# Patient Record
Sex: Female | Born: 1940 | Race: White | Hispanic: No | Smoking: Former smoker
Health system: Southern US, Community
[De-identification: ages and names within clinical notes are randomized; demographics above are authoritative.]

## PROBLEM LIST (undated history)

## (undated) ENCOUNTER — Emergency Department: Admission: EM | Payer: Self-pay | Source: Home / Self Care

## (undated) DIAGNOSIS — E785 Hyperlipidemia, unspecified: Secondary | ICD-10-CM

## (undated) DIAGNOSIS — F329 Major depressive disorder, single episode, unspecified: Secondary | ICD-10-CM

## (undated) DIAGNOSIS — J449 Chronic obstructive pulmonary disease, unspecified: Secondary | ICD-10-CM

## (undated) DIAGNOSIS — K120 Recurrent oral aphthae: Secondary | ICD-10-CM

## (undated) DIAGNOSIS — E119 Type 2 diabetes mellitus without complications: Secondary | ICD-10-CM

## (undated) DIAGNOSIS — F32A Depression, unspecified: Secondary | ICD-10-CM

## (undated) DIAGNOSIS — C50911 Malignant neoplasm of unspecified site of right female breast: Secondary | ICD-10-CM

## (undated) DIAGNOSIS — I214 Non-ST elevation (NSTEMI) myocardial infarction: Secondary | ICD-10-CM

## (undated) DIAGNOSIS — K219 Gastro-esophageal reflux disease without esophagitis: Secondary | ICD-10-CM

## (undated) DIAGNOSIS — I509 Heart failure, unspecified: Secondary | ICD-10-CM

## (undated) DIAGNOSIS — G250 Essential tremor: Secondary | ICD-10-CM

## (undated) DIAGNOSIS — E039 Hypothyroidism, unspecified: Secondary | ICD-10-CM

## (undated) DIAGNOSIS — F3289 Other specified depressive episodes: Secondary | ICD-10-CM

## (undated) DIAGNOSIS — I2510 Atherosclerotic heart disease of native coronary artery without angina pectoris: Secondary | ICD-10-CM

## (undated) DIAGNOSIS — J4489 Other specified chronic obstructive pulmonary disease: Secondary | ICD-10-CM

## (undated) DIAGNOSIS — E669 Obesity, unspecified: Secondary | ICD-10-CM

## (undated) DIAGNOSIS — I209 Angina pectoris, unspecified: Secondary | ICD-10-CM

## (undated) HISTORY — DX: Obesity, unspecified: E66.9

## (undated) HISTORY — PX: MASTECTOMY: SHX3

## (undated) HISTORY — DX: Other specified depressive episodes: F32.89

## (undated) HISTORY — DX: Essential tremor: G25.0

## (undated) HISTORY — DX: Recurrent oral aphthae: K12.0

## (undated) HISTORY — PX: TONSILLECTOMY: SUR1361

## (undated) HISTORY — PX: APPENDECTOMY: SHX54

## (undated) HISTORY — DX: Hyperlipidemia, unspecified: E78.5

## (undated) HISTORY — DX: Type 2 diabetes mellitus without complications: E11.9

## (undated) HISTORY — DX: Major depressive disorder, single episode, unspecified: F32.9

## (undated) HISTORY — PX: CHOLECYSTECTOMY: SHX55

## (undated) HISTORY — PX: ABDOMINAL HYSTERECTOMY: SHX81

## (undated) HISTORY — DX: Chronic obstructive pulmonary disease, unspecified: J44.9

## (undated) HISTORY — DX: Atherosclerotic heart disease of native coronary artery without angina pectoris: I25.10

## (undated) HISTORY — PX: OTHER SURGICAL HISTORY: SHX169

## (undated) HISTORY — DX: Non-ST elevation (NSTEMI) myocardial infarction: I21.4

## (undated) HISTORY — DX: Other specified chronic obstructive pulmonary disease: J44.89

## (undated) HISTORY — DX: Hypothyroidism, unspecified: E03.9

## (undated) HISTORY — DX: Depression, unspecified: F32.A

## (undated) HISTORY — DX: Malignant neoplasm of unspecified site of right female breast: C50.911

---

## 1996-09-02 DIAGNOSIS — C50911 Malignant neoplasm of unspecified site of right female breast: Secondary | ICD-10-CM

## 1996-09-02 HISTORY — DX: Malignant neoplasm of unspecified site of right female breast: C50.911

## 1997-12-16 ENCOUNTER — Encounter: Admission: RE | Admit: 1997-12-16 | Discharge: 1998-03-16 | Payer: Self-pay | Admitting: Radiation Oncology

## 1997-12-19 ENCOUNTER — Other Ambulatory Visit: Admission: RE | Admit: 1997-12-19 | Discharge: 1997-12-19 | Payer: Self-pay | Admitting: General Surgery

## 1999-07-30 ENCOUNTER — Inpatient Hospital Stay (HOSPITAL_COMMUNITY): Admission: EM | Admit: 1999-07-30 | Discharge: 1999-08-03 | Payer: Self-pay | Admitting: Emergency Medicine

## 1999-07-30 ENCOUNTER — Encounter: Payer: Self-pay | Admitting: Emergency Medicine

## 1999-08-01 ENCOUNTER — Encounter: Payer: Self-pay | Admitting: Emergency Medicine

## 1999-08-02 ENCOUNTER — Encounter: Payer: Self-pay | Admitting: Emergency Medicine

## 1999-11-30 ENCOUNTER — Encounter: Admission: RE | Admit: 1999-11-30 | Discharge: 1999-11-30 | Payer: Self-pay | Admitting: Oncology

## 1999-11-30 ENCOUNTER — Encounter: Payer: Self-pay | Admitting: Oncology

## 2000-05-27 ENCOUNTER — Encounter: Payer: Self-pay | Admitting: Oncology

## 2000-05-27 ENCOUNTER — Encounter: Admission: RE | Admit: 2000-05-27 | Discharge: 2000-05-27 | Payer: Self-pay | Admitting: Oncology

## 2001-09-22 ENCOUNTER — Encounter: Payer: Self-pay | Admitting: Hematology and Oncology

## 2001-09-22 ENCOUNTER — Encounter: Admission: RE | Admit: 2001-09-22 | Discharge: 2001-09-22 | Payer: Self-pay | Admitting: Hematology and Oncology

## 2002-09-13 ENCOUNTER — Encounter: Admission: RE | Admit: 2002-09-13 | Discharge: 2002-09-13 | Payer: Self-pay | Admitting: Hematology and Oncology

## 2002-09-13 ENCOUNTER — Encounter: Payer: Self-pay | Admitting: Hematology and Oncology

## 2003-09-15 ENCOUNTER — Encounter: Admission: RE | Admit: 2003-09-15 | Discharge: 2003-09-15 | Payer: Self-pay | Admitting: Hematology and Oncology

## 2003-10-31 ENCOUNTER — Ambulatory Visit: Admission: RE | Admit: 2003-10-31 | Discharge: 2003-10-31 | Payer: Self-pay | Admitting: Radiation Oncology

## 2004-09-17 ENCOUNTER — Encounter: Admission: RE | Admit: 2004-09-17 | Discharge: 2004-09-17 | Payer: Self-pay | Admitting: Hematology and Oncology

## 2004-10-04 ENCOUNTER — Encounter: Admission: RE | Admit: 2004-10-04 | Discharge: 2005-01-02 | Payer: Self-pay | Admitting: Emergency Medicine

## 2004-10-26 ENCOUNTER — Ambulatory Visit: Admission: RE | Admit: 2004-10-26 | Discharge: 2004-10-26 | Payer: Self-pay | Admitting: Radiation Oncology

## 2005-06-10 ENCOUNTER — Encounter: Admission: RE | Admit: 2005-06-10 | Discharge: 2005-06-10 | Payer: Self-pay | Admitting: Emergency Medicine

## 2005-09-18 ENCOUNTER — Encounter: Admission: RE | Admit: 2005-09-18 | Discharge: 2005-09-18 | Payer: Self-pay | Admitting: Hematology and Oncology

## 2006-09-19 ENCOUNTER — Encounter: Admission: RE | Admit: 2006-09-19 | Discharge: 2006-09-19 | Payer: Self-pay | Admitting: Hematology and Oncology

## 2007-08-05 ENCOUNTER — Inpatient Hospital Stay (HOSPITAL_COMMUNITY): Admission: EM | Admit: 2007-08-05 | Discharge: 2007-08-12 | Payer: Self-pay | Admitting: Emergency Medicine

## 2007-08-05 ENCOUNTER — Ambulatory Visit: Payer: Self-pay | Admitting: Internal Medicine

## 2007-08-05 LAB — CONVERTED CEMR LAB: Pap Smear: NORMAL

## 2007-08-06 ENCOUNTER — Encounter (INDEPENDENT_AMBULATORY_CARE_PROVIDER_SITE_OTHER): Payer: Self-pay | Admitting: *Deleted

## 2007-08-17 DIAGNOSIS — J439 Emphysema, unspecified: Secondary | ICD-10-CM

## 2007-08-17 DIAGNOSIS — E669 Obesity, unspecified: Secondary | ICD-10-CM

## 2007-08-17 DIAGNOSIS — I2510 Atherosclerotic heart disease of native coronary artery without angina pectoris: Secondary | ICD-10-CM | POA: Insufficient documentation

## 2007-08-17 DIAGNOSIS — J9600 Acute respiratory failure, unspecified whether with hypoxia or hypercapnia: Secondary | ICD-10-CM

## 2007-08-17 DIAGNOSIS — E785 Hyperlipidemia, unspecified: Secondary | ICD-10-CM

## 2007-08-18 ENCOUNTER — Ambulatory Visit: Payer: Self-pay | Admitting: Critical Care Medicine

## 2007-08-18 DIAGNOSIS — J9610 Chronic respiratory failure, unspecified whether with hypoxia or hypercapnia: Secondary | ICD-10-CM | POA: Insufficient documentation

## 2007-08-31 ENCOUNTER — Encounter: Payer: Self-pay | Admitting: Critical Care Medicine

## 2007-08-31 ENCOUNTER — Encounter: Payer: Self-pay | Admitting: Pulmonary Disease

## 2007-08-31 ENCOUNTER — Ambulatory Visit (HOSPITAL_BASED_OUTPATIENT_CLINIC_OR_DEPARTMENT_OTHER): Admission: RE | Admit: 2007-08-31 | Discharge: 2007-08-31 | Payer: Self-pay | Admitting: Critical Care Medicine

## 2007-09-15 ENCOUNTER — Ambulatory Visit: Payer: Self-pay | Admitting: Critical Care Medicine

## 2007-09-15 ENCOUNTER — Ambulatory Visit: Payer: Self-pay | Admitting: Pulmonary Disease

## 2007-09-21 ENCOUNTER — Encounter: Payer: Self-pay | Admitting: Critical Care Medicine

## 2008-07-22 ENCOUNTER — Telehealth (INDEPENDENT_AMBULATORY_CARE_PROVIDER_SITE_OTHER): Payer: Self-pay | Admitting: *Deleted

## 2008-08-08 ENCOUNTER — Telehealth (INDEPENDENT_AMBULATORY_CARE_PROVIDER_SITE_OTHER): Payer: Self-pay | Admitting: *Deleted

## 2008-09-06 ENCOUNTER — Ambulatory Visit: Payer: Self-pay | Admitting: Critical Care Medicine

## 2008-09-06 DIAGNOSIS — R892 Abnormal level of other drugs, medicaments and biological substances in specimens from other organs, systems and tissues: Secondary | ICD-10-CM | POA: Insufficient documentation

## 2008-09-06 LAB — CONVERTED CEMR LAB
ALT: 23 units/L (ref 0–35)
AST: 24 units/L (ref 0–37)
Albumin: 3.9 g/dL (ref 3.5–5.2)
Alkaline Phosphatase: 64 units/L (ref 39–117)
Total Bilirubin: 0.7 mg/dL (ref 0.3–1.2)

## 2008-11-14 ENCOUNTER — Encounter: Payer: Self-pay | Admitting: Critical Care Medicine

## 2008-11-23 ENCOUNTER — Ambulatory Visit: Payer: Self-pay | Admitting: Internal Medicine

## 2008-12-06 ENCOUNTER — Ambulatory Visit: Payer: Self-pay | Admitting: Critical Care Medicine

## 2008-12-07 ENCOUNTER — Encounter: Payer: Self-pay | Admitting: Critical Care Medicine

## 2008-12-12 ENCOUNTER — Telehealth (INDEPENDENT_AMBULATORY_CARE_PROVIDER_SITE_OTHER): Payer: Self-pay | Admitting: *Deleted

## 2009-03-08 ENCOUNTER — Telehealth (INDEPENDENT_AMBULATORY_CARE_PROVIDER_SITE_OTHER): Payer: Self-pay | Admitting: *Deleted

## 2009-03-08 ENCOUNTER — Ambulatory Visit: Payer: Self-pay | Admitting: Critical Care Medicine

## 2009-03-23 ENCOUNTER — Ambulatory Visit: Payer: Self-pay | Admitting: Critical Care Medicine

## 2009-03-23 ENCOUNTER — Telehealth: Payer: Self-pay | Admitting: Critical Care Medicine

## 2009-03-24 LAB — CONVERTED CEMR LAB
BUN: 18 mg/dL (ref 6–23)
CO2: 34 meq/L — ABNORMAL HIGH (ref 19–32)
Calcium: 9.5 mg/dL (ref 8.4–10.5)
Chloride: 100 meq/L (ref 96–112)
Creatinine, Ser: 1 mg/dL (ref 0.4–1.2)
GFR calc non Af Amer: 58.43 mL/min (ref >60)
Glucose, Bld: 154 mg/dL — ABNORMAL HIGH (ref 70–99)
Potassium: 4.4 meq/L (ref 3.5–5.1)
Sodium: 142 meq/L (ref 135–145)

## 2009-04-19 ENCOUNTER — Telehealth: Payer: Self-pay | Admitting: Critical Care Medicine

## 2009-05-10 ENCOUNTER — Ambulatory Visit: Payer: Self-pay | Admitting: Critical Care Medicine

## 2009-06-19 ENCOUNTER — Ambulatory Visit: Payer: Self-pay | Admitting: Critical Care Medicine

## 2009-06-19 ENCOUNTER — Telehealth: Payer: Self-pay | Admitting: Critical Care Medicine

## 2009-06-20 ENCOUNTER — Telehealth (INDEPENDENT_AMBULATORY_CARE_PROVIDER_SITE_OTHER): Payer: Self-pay | Admitting: *Deleted

## 2009-06-23 ENCOUNTER — Encounter: Payer: Self-pay | Admitting: Critical Care Medicine

## 2009-07-04 ENCOUNTER — Telehealth (INDEPENDENT_AMBULATORY_CARE_PROVIDER_SITE_OTHER): Payer: Self-pay | Admitting: *Deleted

## 2009-07-07 ENCOUNTER — Encounter: Payer: Self-pay | Admitting: Critical Care Medicine

## 2009-07-17 ENCOUNTER — Encounter: Payer: Self-pay | Admitting: Critical Care Medicine

## 2009-07-21 ENCOUNTER — Ambulatory Visit: Payer: Self-pay | Admitting: Critical Care Medicine

## 2009-07-21 ENCOUNTER — Telehealth (INDEPENDENT_AMBULATORY_CARE_PROVIDER_SITE_OTHER): Payer: Self-pay | Admitting: *Deleted

## 2009-07-21 LAB — CONVERTED CEMR LAB
BUN: 11 mg/dL (ref 6–23)
CO2: 37 meq/L — ABNORMAL HIGH (ref 19–32)
Calcium: 9.1 mg/dL (ref 8.4–10.5)
Chloride: 99 meq/L (ref 96–112)
Creatinine, Ser: 0.8 mg/dL (ref 0.4–1.2)
Glucose, Bld: 194 mg/dL — ABNORMAL HIGH (ref 70–99)
Magnesium: 2 mg/dL (ref 1.5–2.5)
Potassium: 4.4 meq/L (ref 3.5–5.1)
Sodium: 144 meq/L (ref 135–145)

## 2009-08-03 ENCOUNTER — Encounter: Payer: Self-pay | Admitting: Internal Medicine

## 2009-08-14 ENCOUNTER — Telehealth (INDEPENDENT_AMBULATORY_CARE_PROVIDER_SITE_OTHER): Payer: Self-pay | Admitting: *Deleted

## 2009-08-21 ENCOUNTER — Encounter: Payer: Self-pay | Admitting: Critical Care Medicine

## 2009-08-23 ENCOUNTER — Ambulatory Visit: Payer: Self-pay | Admitting: Critical Care Medicine

## 2009-08-23 DIAGNOSIS — K120 Recurrent oral aphthae: Secondary | ICD-10-CM

## 2009-08-28 ENCOUNTER — Encounter: Payer: Self-pay | Admitting: Critical Care Medicine

## 2009-09-04 ENCOUNTER — Telehealth: Payer: Self-pay | Admitting: Internal Medicine

## 2009-09-08 ENCOUNTER — Encounter: Payer: Self-pay | Admitting: Internal Medicine

## 2009-10-13 ENCOUNTER — Encounter: Payer: Self-pay | Admitting: Critical Care Medicine

## 2009-10-13 ENCOUNTER — Telehealth (INDEPENDENT_AMBULATORY_CARE_PROVIDER_SITE_OTHER): Payer: Self-pay | Admitting: *Deleted

## 2009-10-19 ENCOUNTER — Encounter: Payer: Self-pay | Admitting: Critical Care Medicine

## 2009-10-30 ENCOUNTER — Ambulatory Visit: Payer: Self-pay | Admitting: Critical Care Medicine

## 2009-10-31 ENCOUNTER — Telehealth (INDEPENDENT_AMBULATORY_CARE_PROVIDER_SITE_OTHER): Payer: Self-pay | Admitting: *Deleted

## 2009-12-13 ENCOUNTER — Ambulatory Visit: Payer: Self-pay | Admitting: Critical Care Medicine

## 2009-12-26 ENCOUNTER — Encounter: Payer: Self-pay | Admitting: Critical Care Medicine

## 2010-02-14 ENCOUNTER — Encounter: Payer: Self-pay | Admitting: Critical Care Medicine

## 2010-03-02 DIAGNOSIS — E119 Type 2 diabetes mellitus without complications: Secondary | ICD-10-CM

## 2010-03-02 HISTORY — DX: Type 2 diabetes mellitus without complications: E11.9

## 2010-03-13 ENCOUNTER — Telehealth: Payer: Self-pay | Admitting: Internal Medicine

## 2010-03-13 ENCOUNTER — Ambulatory Visit: Payer: Self-pay | Admitting: Critical Care Medicine

## 2010-03-13 DIAGNOSIS — R358 Other polyuria: Secondary | ICD-10-CM

## 2010-03-14 ENCOUNTER — Ambulatory Visit: Payer: Self-pay | Admitting: Internal Medicine

## 2010-03-14 ENCOUNTER — Telehealth: Payer: Self-pay | Admitting: Critical Care Medicine

## 2010-03-14 DIAGNOSIS — E1149 Type 2 diabetes mellitus with other diabetic neurological complication: Secondary | ICD-10-CM | POA: Insufficient documentation

## 2010-03-14 DIAGNOSIS — B379 Candidiasis, unspecified: Secondary | ICD-10-CM | POA: Insufficient documentation

## 2010-03-14 LAB — CONVERTED CEMR LAB
Basophils Absolute: 0 10*3/uL (ref 0.0–0.1)
Basophils Relative: 0.1 % (ref 0.0–3.0)
Bilirubin Urine: NEGATIVE
Blood Glucose, Fingerstick: 367
CO2: 30 meq/L (ref 19–32)
Calcium: 8.8 mg/dL (ref 8.4–10.5)
Chloride: 97 meq/L (ref 96–112)
Creatinine, Ser: 0.8 mg/dL (ref 0.4–1.2)
Eosinophils Absolute: 0 10*3/uL (ref 0.0–0.7)
Eosinophils Relative: 0.4 % (ref 0.0–5.0)
Glucose, Bld: 542 mg/dL (ref 70–99)
HCT: 46.5 % — ABNORMAL HIGH (ref 36.0–46.0)
Hemoglobin, Urine: NEGATIVE
Hemoglobin: 16.3 g/dL — ABNORMAL HIGH (ref 12.0–15.0)
Hgb A1c MFr Bld: 14.8 % — ABNORMAL HIGH (ref 4.6–6.5)
Lymphocytes Relative: 14.6 % (ref 12.0–46.0)
MCHC: 34.9 g/dL (ref 30.0–36.0)
MCV: 99.7 fL (ref 78.0–100.0)
Monocytes Absolute: 0.3 10*3/uL (ref 0.1–1.0)
Monocytes Relative: 3.7 % (ref 3.0–12.0)
Neutro Abs: 6.9 10*3/uL (ref 1.4–7.7)
Neutrophils Relative %: 81.2 % — ABNORMAL HIGH (ref 43.0–77.0)
Platelets: 216 10*3/uL (ref 150.0–400.0)
RBC: 4.67 M/uL (ref 3.87–5.11)
RDW: 13.3 % (ref 11.5–14.6)
Sodium: 137 meq/L (ref 135–145)
Specific Gravity, Urine: <=1.005 (ref 1.000–1.030)
Urine Glucose: >=1000 mg/dL
Urobilinogen, UA: 0.2 (ref 0.0–1.0)
WBC: 8.5 10*3/uL (ref 4.5–10.5)

## 2010-03-15 ENCOUNTER — Telehealth (INDEPENDENT_AMBULATORY_CARE_PROVIDER_SITE_OTHER): Payer: Self-pay | Admitting: *Deleted

## 2010-03-21 ENCOUNTER — Telehealth: Payer: Self-pay | Admitting: Internal Medicine

## 2010-03-21 ENCOUNTER — Telehealth (INDEPENDENT_AMBULATORY_CARE_PROVIDER_SITE_OTHER): Payer: Self-pay | Admitting: *Deleted

## 2010-03-27 ENCOUNTER — Telehealth: Payer: Self-pay | Admitting: Critical Care Medicine

## 2010-03-28 ENCOUNTER — Ambulatory Visit: Payer: Self-pay | Admitting: Internal Medicine

## 2010-03-30 ENCOUNTER — Encounter: Payer: Self-pay | Admitting: Internal Medicine

## 2010-04-02 ENCOUNTER — Encounter: Admission: RE | Admit: 2010-04-02 | Discharge: 2010-05-31 | Payer: Self-pay | Admitting: Internal Medicine

## 2010-04-02 ENCOUNTER — Encounter: Payer: Self-pay | Admitting: Internal Medicine

## 2010-04-16 ENCOUNTER — Telehealth: Payer: Self-pay | Admitting: Internal Medicine

## 2010-04-18 ENCOUNTER — Encounter: Payer: Self-pay | Admitting: Critical Care Medicine

## 2010-05-10 ENCOUNTER — Telehealth: Payer: Self-pay | Admitting: Internal Medicine

## 2010-06-04 ENCOUNTER — Encounter: Payer: Self-pay | Admitting: Internal Medicine

## 2010-06-18 ENCOUNTER — Encounter: Payer: Self-pay | Admitting: Critical Care Medicine

## 2010-06-26 ENCOUNTER — Ambulatory Visit: Payer: Self-pay | Admitting: Critical Care Medicine

## 2010-06-26 ENCOUNTER — Ambulatory Visit: Payer: Self-pay | Admitting: Internal Medicine

## 2010-06-26 DIAGNOSIS — L659 Nonscarring hair loss, unspecified: Secondary | ICD-10-CM | POA: Insufficient documentation

## 2010-06-26 LAB — CONVERTED CEMR LAB
Blood Glucose, Fingerstick: 114
Cholesterol: 171 mg/dL (ref 0–200)
HDL: 52 mg/dL (ref >39.00)
Hgb A1c MFr Bld: 6.5 % (ref 4.6–6.5)
LDL Cholesterol: 87 mg/dL (ref 0–99)
TSH: 0.64 microintl units/mL (ref 0.35–5.50)
Total CHOL/HDL Ratio: 3
VLDL: 31.6 mg/dL (ref 0.0–40.0)

## 2010-07-03 ENCOUNTER — Encounter: Payer: Self-pay | Admitting: Internal Medicine

## 2010-07-04 ENCOUNTER — Telehealth: Payer: Self-pay | Admitting: Internal Medicine

## 2010-07-13 ENCOUNTER — Telehealth (INDEPENDENT_AMBULATORY_CARE_PROVIDER_SITE_OTHER): Payer: Self-pay | Admitting: *Deleted

## 2010-08-17 ENCOUNTER — Encounter: Payer: Self-pay | Admitting: Critical Care Medicine

## 2010-09-25 ENCOUNTER — Ambulatory Visit
Admission: RE | Admit: 2010-09-25 | Discharge: 2010-09-25 | Payer: Self-pay | Source: Home / Self Care | Attending: Internal Medicine | Admitting: Internal Medicine

## 2010-09-25 ENCOUNTER — Ambulatory Visit
Admission: RE | Admit: 2010-09-25 | Discharge: 2010-09-25 | Payer: Self-pay | Source: Home / Self Care | Attending: Critical Care Medicine | Admitting: Critical Care Medicine

## 2010-09-25 ENCOUNTER — Encounter: Payer: Self-pay | Admitting: Critical Care Medicine

## 2010-09-25 ENCOUNTER — Other Ambulatory Visit: Payer: Self-pay | Admitting: Internal Medicine

## 2010-09-25 DIAGNOSIS — F329 Major depressive disorder, single episode, unspecified: Secondary | ICD-10-CM | POA: Insufficient documentation

## 2010-09-25 DIAGNOSIS — F3289 Other specified depressive episodes: Secondary | ICD-10-CM | POA: Insufficient documentation

## 2010-09-25 LAB — HEPATIC FUNCTION PANEL
ALT: 23 U/L (ref 0–35)
AST: 22 U/L (ref 0–37)
Albumin: 4 g/dL (ref 3.5–5.2)
Bilirubin, Direct: 0.1 mg/dL (ref 0.0–0.3)
Total Bilirubin: 0.6 mg/dL (ref 0.3–1.2)
Total Protein: 6.7 g/dL (ref 6.0–8.3)

## 2010-09-25 LAB — CREATININE, SERUM: Creatinine, Ser: 0.9 mg/dL (ref 0.4–1.2)

## 2010-09-25 LAB — POTASSIUM: Potassium: 4.8 mEq/L (ref 3.5–5.1)

## 2010-09-25 LAB — HEMOGLOBIN A1C: Hgb A1c MFr Bld: 6.8 % — ABNORMAL HIGH (ref 4.6–6.5)

## 2010-09-30 LAB — CONVERTED CEMR LAB
BUN: 13 mg/dL (ref 6–23)
CO2: 32 meq/L (ref 19–32)
Chloride: 101 meq/L (ref 96–112)
Creatinine, Ser: 1 mg/dL (ref 0.4–1.2)
GFR calc non Af Amer: 58.48 mL/min (ref >60)
Glucose, Bld: 162 mg/dL — ABNORMAL HIGH (ref 70–99)
Potassium: 3.3 meq/L — ABNORMAL LOW (ref 3.5–5.1)
Sodium: 142 meq/L (ref 135–145)

## 2010-10-01 ENCOUNTER — Encounter: Payer: Self-pay | Admitting: Internal Medicine

## 2010-10-04 NOTE — Assessment & Plan Note (Signed)
Summary: Pulmonary OV   Primary Provider/Referring Provider:  Newt Lukes MD  CC:  Follow up.  Pt c/o increased SOB with exertion - onset this am.  Occas prod cough with clear mucus.  Denies wheezing and chest tightness.[name]  History of Present Illness: 70 yo WF with advanced COPD Golds Stage III  oxygen dependent here for pulm f/u.    March 13, 2010 2:27 PM f/u from 4/11. The pt  notes more excess urination and urine incontinence.   The pt was rx for a yeast infxn x one month between the thighs.   Hospice rx diflucan and was better.  Pt with no prior dx of DM.  Pt is on prednisone 10mg /d.   The pt had hx of high BS when on high dose steroids with copd exac in the past.  The pt.  is still in hospice.  June 26, 2010 12:01 PM Pt notes legs hurting and throbbing.  Legs  not cramping,  just throbbing no chest pain.  Dyspnea is bad with exertion.   Drove self to OV No mucus  in the am.     September 25, 2010 11:50 AM Able to walk,  no real cough,  in the am is clear.  Lives by self, can make food. No real mucus.  Main issue is dyspnea at rest and with minimal exertion.  THe pt notes ongoing depression and anxiety.  No chest pain.  Occ pain bilat in LLs,  The pt is able to drive self. The pt is here for hospice recert.  Clinical Reports Reviewed:  PFT's:  12/06/2008: DLCO %Predicted:  53 FEF 25/75 %Predicted:  13 FEV1 %Predicted:  51 FVC %Predicted:  71 Post Spirometry FEF 25/75 %Predicted:  20 Post Spirometry FEV1 %Predicted:  59 Post Spirometry FVC %Predicted:  75 RV %Predicted:  156 TLC %Predicted:  106   Preventive Screening-Counseling & Management  Alcohol-Tobacco     Alcohol drinks/day: 0     Smoking Status: quit     Year Quit: 2008     Pack years: 64  Current Medications (verified): 1)  Bayer Low Strength 81 Mg  Tbec (Aspirin) .... Take 1 Tablet By Mouth Once A Day 2)  Lasix 40 Mg  Tabs (Furosemide) .... Take 1 Tablet By Mouth Two Times A Day 3)  Klor-Con 20  Meq  Pack (Potassium Chloride) .... Take 1 Tablets By Mouth Twice A Day 4)  Albuterol Sulfate (2.5 Mg/57ml) 0.083%  Nebu (Albuterol Sulfate) .... Four Times Daily or Every 6 Hours As Needed 5)  Nexium 40 Mg  Cpdr (Esomeprazole Magnesium) .... Take 1 Tablet By Mouth Once A Day 6)  Advair Diskus 500-50 Mcg/dose  Misc (Fluticasone-Salmeterol) .... One Puff Twice Daily 7)  Alprazolam 0.5 Mg  Tabs (Alprazolam) .... Take One Tab By Mouth Every 8 Hours As Needed 8)  Vitamin C 500 Mg Tabs (Ascorbic Acid) .[name].. 1 By Mouth Daily 9)  Fish Oil 1200 Mg Caps (Omega-3 Fatty Acids) .[name].. 1 Two Times A Day 10)  Vitamin E Complex 400 Unit Caps (Vitamin E) .... 2 By Mouth Daily 11)  Centrum Silver  Tabs (Multiple Vitamins-Minerals) .[name].. 1 By Mouth Daily 12)  Benzonatate 100 Mg Caps (Benzonatate) .... 2 By Mouth Every 8 Hours As Needed 13)  Prednisone 10 Mg  Tabs (Prednisone) .[name].. 1 Once Daily 14)  Magic Mouth Wash .Marland KitchenMarland KitchenMarland Kitchen 15ml Swish Gargle Expectorate Three Times Daily As Needed 15)  Microgaurd Powder .... As Directed 16)  Metformin Hcl 500  Mg Xr24h-Tab (Metformin Hcl) .[name].. 1 By Mouth Two Times A Day 17)  Glimepiride 2 Mg Tabs (Glimepiride) .... 1/2 By Mouth  Every Morning If Cbg >150 18)  Onetouch Ultra Test  Strp (Glucose Blood) .... Check Blood Sugar Two Times A Day  Dx: 250.00 19)  Onetouch Lancets  Misc (Lancets) .... Use Two Times A Day  Dx: 250.00 20)  Ventolin Hfa 108 (90 Base) Mcg/act Aers (Albuterol Sulfate) .... 2 Puffs Every 4-6 Hours As Needed 21)  Senokot S 8.6-50 Mg Tabs (Sennosides-Docusate Sodium) .[name].. 1-2 By Mouth At Bedtime As Needed For Constipation 22)  Pravastatin Sodium 40 Mg Tabs (Pravastatin Sodium) .[name].. 1 Tab By Mouth Once Daily 23)  Spiriva Handihaler 18 Mcg Caps (Tiotropium Bromide Monohydrate) .... Once Daily 24)  Advil 200 Mg Tabs (Ibuprofen) .... 2 Every 6 Hours As Needed  Allergies (verified): 1)  ! Sulfa 2)  ! Codeine  Past History:  Past medical, surgical, family and social  histories (including risk factors) reviewed, and no changes noted (except as noted below).  Past Medical History: Reviewed history from 06/26/2010 and no changes required. 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 Diabetes, Type 2     -03/2010: Hgb A1C 14.8, BS 540   MD roster: Lendon Ka  Past Surgical History: Reviewed history from 03/14/2010 and no changes required. Appendectomy Cholecystectomy Hysterectomy  Mastectomy, right Tonsillectomy  Past Pulmonary History:  Pulmonary History: COPD Golds Stage III     12/06/2008:  PFT's (COPD) Pulmonary Function Test  Date: 12/06/2008 Height (in.): 63 Gender: Female  Pre-Spirometry  FVC     Value: 1.95 L/min   Pred: 2.76 L/min     % Pred: 71 % FEV1     Value: 1.01 L     Pred: 1.97 L     % Pred: 51 % FEV1/FVC   Value: 52 %     Pred: 72 %     FEF 25-75   Value: 0.31 L/min   Pred: 2.29 L/min     % Pred: 13 %  Post-Spirometry  FVC     Value: 2.08 L/min   Pred: 2.76 L/min     % Pred: 75 % FEV1     Value: 1.16 L     Pred: 1.97 L     % Pred: 59 % FEV1/FVC   Value: 56 %     Pred: 72 %     FEF 25-75   Value: 0.46 L/min   Pred: 2.29 L/min     % Pred: 20 %  Lung Volumes  TLC     Value: 4.93 L   % Pred: 106 % RV     Value: 2.86 L   % Pred: 156 % DLCO     Value: 12.8 %   % Pred: 53 % DLCO/VA   Value: 4.35 %   % Pred: 122 %  Evaluation:  severe obstruction with significant bronchodilator response  Polysomnogram 12/08: No evidence of sleep apnea Nocturnal desat corrected with 3L [place] oxygen  Family History: Reviewed history from 03/14/2010 and no changes required. non contributory no diabetes known  Social History: Reviewed history from 06/26/2010 and no changes required. former smoker.  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  The patient denies productive cough, non-productive cough, coughing up blood, chest pain, irregular heartbeats, acid heartburn, indigestion, loss of appetite, weight change, abdominal pain, difficulty swallowing, sore throat, tooth/dental problems, headaches, nasal congestion/difficulty breathing through nose, sneezing, itching, ear ache, anxiety, depression, hand/feet swelling, joint stiffness or pain, rash, change in color of mucus, and fever.    Vital Signs:  Patient profile:   70 year old female Height:      62 inches Weight:      208 pounds BMI:     38.18 O2 Sat:      92 % on 1.5 L/minpulsed Temp:     97.6 degrees F oral Pulse rate:   88 / minute BP sitting:   130 / 78  (left arm) Cuff size:   regular  Vitals Entered By: Gweneth Dimitri RN (September 25, 2010 11:32 AM)  O2 Flow:  1.5 L/minpulsed  Serial Vital Signs/Assessments:  Comments: Ambulatory Pulse Oximetry  Resting; HR_83___    02 Sat__93% 2.5 liters pulsed___  Lap1 (185 feet)   HR__91___   02 Sat__91% 2.5 liters pulsed___ Lap2 (185 feet)   HR__102___   02 Sat__91% 2.5 liters pulsed___    Lap3 (185 feet)   HR_____   02 Sat_____  __X_Test Completed without Difficulty. Pt could only walk 2 laps due to her being tired and sob. Pt had to stop halfway on 1 st lap due to her being tired and was at 91% 2.5 liters pulsed and heart rate 96. Pt had to stop on 1 1/2 lap due to her being tired and sob and was at 92% 2.5 liters pulsed and heart rate 100. ___Test Stopped due to:  Carver Fila  September 25, 2010 12:16 PM  By: Carver Fila   CC: Follow up.  Pt c/o increased SOB with exertion - onset this am.  Occas prod cough with clear mucus.  Denies wheezing, chest tightness. Comments Medications reviewed with patient Daytime contact number verified with patient. Gweneth Dimitri RN   September 25, 2010 11:36 AM    Physical Exam  Additional Exam:  Gen: Pleasant, obese,  in no distress ENT: no lesions, no postnasal drip, apthous ulcer lower lip internal,  thrush is healing Neck: No JVD, no TMG, no carotid bruits Lungs: no use of accessory muscles, no dullness to percussion, distant BS, no wheeze, no rhonchi Cardiovascular: RRR, heart sounds normal, no murmurs or gallops, no peripheral edema Musculoskeletal: No deformities, no cyanosis or clubbing     Impression & Recommendations:  Problem # 1:  C O P D (ICD-496) Assessment Deteriorated  Her updated medication list for this problem includes:    Albuterol Sulfate (2.5 Mg/32ml) 0.083% Nebu (Albuterol sulfate) .[name]... Four times daily or every 6 hours as needed    Advair Diskus 500-50 Mcg/dose Misc (Fluticasone-salmeterol) ..... One puff twice daily    Ventolin Hfa 108 (90 Base) Mcg/act Aers (Albuterol sulfate) .[name]... 2 puffs every 4-6 hours as needed    Spiriva Handihaler 18 Mcg Caps (Tiotropium bromide monohydrate) ..... Once daily  End stage copd golds stage iv,  The Fev1 has declined further to 0.88  42% pred which is a 15% decline from prior values Pt has overt depression and I discussed this with PCP who will rx sertraline plan cont hospice care No change in inhaled medications.   Maintain treatment program as currently prescribed. I would recert this pt for palliative care  if [facility] MD will agree  Pulmonary Functions Reviewed: FEV1: 1.01 (12/06/2008) >>>  FeV1 0.88 42%  1/12  Medications Added to Medication List This Visit: 1)  Oxygen  .[name].. 3l continuous  Complete Medication List: 1)  Bayer Low Strength 81 Mg Tbec (Aspirin) .... Take 1 tablet by mouth once a day 2)  Lasix 40 Mg Tabs (Furosemide) .... Take 1 tablet by mouth two times a day 3)  Klor-con 20 Meq Pack (Potassium chloride) .... Take 1 tablets by mouth twice a day 4)  Albuterol Sulfate (2.5 Mg/82ml) 0.083% Nebu (Albuterol sulfate) .... Four times  daily or every 6 hours as needed 5)  Nexium 40 Mg Cpdr (Esomeprazole magnesium) .... Take 1 tablet by mouth once a day 6)  Advair Diskus 500-50 Mcg/dose Misc (Fluticasone-salmeterol) .... One puff twice daily 7)  Alprazolam 0.5 Mg Tabs (Alprazolam) .... Take one tab by mouth every 8 hours as needed 8)  Vitamin C 500 Mg Tabs (Ascorbic acid) .[name].. 1 by mouth daily 9)  Fish Oil 1200 Mg Caps (Omega-3 fatty acids) .[name].. 1 two times a day 10)  Vitamin E Complex 400 Unit Caps (Vitamin e) .... 2 by mouth daily 11)  Centrum Silver Tabs (Multiple vitamins-minerals) .[name].. 1 by mouth daily 12)  Benzonatate 100 Mg Caps (Benzonatate) .... 2 by mouth every 8 hours as needed 13)  Prednisone 10 Mg Tabs (Prednisone) .[name].. 1 once daily 14)  Magic Mouth Wash  .Marland KitchenMarland KitchenMarland Kitchen 15ml swish gargle expectorate three times daily as needed 15)  Microgaurd Powder  .... As directed 16)  Metformin Hcl 500 Mg Xr24h-tab (Metformin hcl) .[name].. 1 by mouth two times a day 17)  Glimepiride 2 Mg Tabs (Glimepiride) .... 1/2 by mouth  every morning if cbg >150 18)  Onetouch Ultra Test Strp (Glucose blood) .... Check blood sugar two times a day  dx: 250.00 19)  Onetouch Lancets Misc (Lancets) .... Use two times a day  dx: 250.00 20)  Ventolin Hfa 108 (90 Base) Mcg/act Aers (Albuterol sulfate) .... 2 puffs every 4-6 hours as needed 21)  Senokot S 8.6-50 Mg Tabs (Sennosides-docusate sodium) .[name].. 1-2 by mouth at bedtime as needed for constipation 22)  Pravastatin Sodium 40 Mg Tabs (Pravastatin sodium) .[name].. 1 tab by mouth once daily 23)  Spiriva Handihaler 18 Mcg Caps (Tiotropium bromide monohydrate) .... Once daily 24)  Advil 200 Mg Tabs (Ibuprofen) .... 2 every 6 hours as needed 25)  Oxygen  .[name].. 3l continuous 26)  Sertraline Hcl 25 Mg Tabs (Sertraline hcl) .[name].. 1 by mouth once daily  Other Orders: Est. Patient Level IV (04540) Spirometry w/Graph (94010) Pulse Oximetry, Ambulatory (98119)  Patient Instructions: 1)  No change in medications 2)   I will speak to Hospice about continuing your care 3)  Return in    2      months  Prescriptions: SPIRIVA HANDIHALER 18 MCG CAPS (TIOTROPIUM BROMIDE MONOHYDRATE) once daily  #30 x 6   Entered and Authorized by:   Storm Frisk MD   Signed by:   Storm Frisk MD on 09/25/2010   Method used:   Electronically to        [facility]  [address] [phone]* (retail)       [address]       [place], [place]  [id number]       Ph: [phone] or [phone]       Fax: [phone]   RxID:  [id number]     Appended Document: Pulmonary OV fax [facility]

## 2010-10-04 NOTE — Assessment & Plan Note (Signed)
Summary: Pulmonary OV   Primary Provider/Referring Provider:  Dr. Sanda Linger  CC:  2 month follow up.  Pt states she cont to have SOB when walking and when under stress.  States she has a prod cough with clear mucus.  Denies wheezing and chest tightness.  [name]  History of Present Illness: 70 yo WF with advanced COPD Golds Stage III  oxygen dependent here for pulm f/u.     December 13, 2009 3:25 PM Here for copd f/u .  The pt has no excess mucous.  The pt now has an acapella valve per Hospice. Pt denies any significant sore throat, nasal congestion or excess secretions, fever, chills, sweats, unintended weight loss, pleurtic or exertional chest pain, orthopnea PND, or leg swelling Pt denies any increase in rescue therapy over baseline, denies waking up needing it or having any early am or nocturnal exacerbations of coughing/wheezing/or dyspnea.   August 23, 2009 8:55 AM The pt now has thrush and is on diflucan.   The dyspnea is the same and there is less cough.  There is less mucous production. Hospice helps with home care. The pt remains on oxygen.     Preventive Screening-Counseling & Management  Alcohol-Tobacco     Smoking Status: quit > 6 months  Current Medications (verified): 1)  Bayer Low Strength 81 Mg  Tbec (Aspirin) .... Take 1 Tablet By Mouth Once A Day 2)  Lasix 40 Mg  Tabs (Furosemide) .... Take 1 Tablet By Mouth Two Times A Day 3)  Klor-Con 20 Meq  Pack (Potassium Chloride) .... Take 1 Tablets By Mouth Twice A Day 4)  Albuterol Sulfate (2.5 Mg/66ml) 0.083%  Nebu (Albuterol Sulfate) .... Four Times Daily or Every 6 Hours As Needed 5)  Nexium 40 Mg  Cpdr (Esomeprazole Magnesium) .... Take 1 Tablet By Mouth Once A Day 6)  Advair Diskus 500-50 Mcg/dose  Misc (Fluticasone-Salmeterol) .... One Puff Twice Daily 7)  Alprazolam 0.5 Mg  Tabs (Alprazolam) .... Take One Tab By Mouth Every 8 Hours As Needed 8)  Vitamin C 500 Mg Tabs (Ascorbic Acid) .[name].. 1 By Mouth Daily 9)  Fish  Oil 1200 Mg Caps (Omega-3 Fatty Acids) .[name].. 1 Two Times A Day 10)  Vitamin E Complex 400 Unit Caps (Vitamin E) .... 2 By Mouth Daily 11)  Centrum Silver  Tabs (Multiple Vitamins-Minerals) .[name].. 1 By Mouth Daily 12)  Benzonatate 100 Mg Caps (Benzonatate) .... 2 By Mouth Every 8 Hours As Needed 13)  Prednisone 10 Mg  Tabs (Prednisone) .[name].. 1 Once Daily 14)  Magic Mouth Wash .Marland KitchenMarland KitchenMarland Kitchen 15ml Swish Gargle Expectorate Three Times Daily As Needed 15)  Microgaurd Powder .... As Directed  Allergies (verified): 1)  ! Sulfa 2)  ! Codeine  Past History:  Past medical, surgical, family and social histories (including risk factors) reviewed, and no changes noted (except as noted below).  Past Medical History: Reviewed history from 05/10/2009 and no changes required. C O P D Hyperlipidemia gastroesophageal reflux, anxiety, obesity, breast cancer in 1998, status post chemotherapy and radiation, and right mastectomy, hypothyroidism. Surgical history.  Status post hysterectomy, cholecystectomy, hernia repair x 2.  Status post appendectomy, and hemorrhoidectomy  Past Surgical History: Reviewed history from 11/23/2008 and no changes required. Appendectomy Cholecystectomy Hysterectomy Mastectomy Tonsillectomy  Past Pulmonary History:  Pulmonary History: COPD Golds Stage III     12/06/2008:  PFT's (COPD) Pulmonary Function Test  Date: 12/06/2008 Height (in.): 63 Gender: Female  Pre-Spirometry  FVC     Value:  1.95 L/min   Pred: 2.76 L/min     % Pred: 71 % FEV1     Value: 1.01 L     Pred: 1.97 L     % Pred: 51 % FEV1/FVC   Value: 52 %     Pred: 72 %     FEF 25-75   Value: 0.31 L/min   Pred: 2.29 L/min     % Pred: 13 %  Post-Spirometry  FVC     Value: 2.08 L/min   Pred: 2.76 L/min     % Pred: 75 % FEV1     Value: 1.16 L     Pred: 1.97 L     % Pred: 59 % FEV1/FVC   Value: 56 %     Pred: 72 %     FEF 25-75   Value: 0.46 L/min   Pred: 2.29 L/min     % Pred: 20 %  Lung Volumes  TLC     Value: 4.93  L   % Pred: 106 % RV     Value: 2.86 L   % Pred: 156 % DLCO     Value: 12.8 %   % Pred: 53 % DLCO/VA   Value: 4.35 %   % Pred: 122 %  Evaluation:  severe obstruction with significant bronchodilator response  Polysomnogram 12/08: No evidence of sleep apnea Nocturnal desat corrected with 3L [place] oxygen  Family History: Reviewed history from 09/15/2007 and no changes required. non contributory  Social History: Reviewed history from 11/23/2008 and no changes required. Patient states former smoker.  Retired Widow/Widower Alcohol use-no Drug use-no Regular exercise-no  Review of Systems       The patient complains of shortness of breath with activity.  The patient denies shortness of breath at rest, productive cough, non-productive cough, coughing up blood, chest pain, irregular heartbeats, acid heartburn, indigestion, loss of appetite, weight change, abdominal pain, difficulty swallowing, sore throat, tooth/dental problems, headaches, nasal congestion/difficulty breathing through nose, sneezing, itching, ear ache, anxiety, depression, hand/feet swelling, joint stiffness or pain, rash, change in color of mucus, and fever.    Vital Signs:  Patient profile:   70 year old female Height:      62 inches Weight:      230.25 pounds BMI:     42.27 O2 Sat:      93 % on 3 L/minpulsed Pulse rate:   94 / minute BP sitting:   122 / 84  (left arm) Cuff size:   large  Vitals Entered By: Gweneth Dimitri RN (December 13, 2009 2:49 PM)  O2 Flow:  3 L/minpulsed CC: 2 month follow up.  Pt states she cont to have SOB when walking and when under stress.  States she has a prod cough with clear mucus.  Denies wheezing and chest tightness.   Comments Medications reviewed with patient Daytime contact number verified with patient. Gweneth Dimitri RN  December 13, 2009 2:50 PM    Physical Exam  Additional Exam:  Gen: Pleasant, obese,  in no distress ENT: no lesions, no postnasal drip, apthous ulcer lower lip  internal,  thrush is healing Neck: No JVD, no TMG, no carotid bruits Lungs: no use of accessory muscles, no dullness to percussion, distant BS, no wheeze, no rhonchi Cardiovascular: RRR, heart sounds normal, no murmurs or gallops, no peripheral edema Musculoskeletal: No deformities, no cyanosis or clubbing     Impression & Recommendations:  Problem # 1:  CHRONIC RESPIRATORY FAILURE (BMW-413.24) Assessment Unchanged  chronic  resp failure with Golds stage IV COPD plan cont hospice care cont inhaled meds as prescribed  Complete Medication List: 1)  Bayer Low Strength 81 Mg Tbec (Aspirin) .... Take 1 tablet by mouth once a day 2)  Lasix 40 Mg Tabs (Furosemide) .... Take 1 tablet by mouth two times a day 3)  Klor-con 20 Meq Pack (Potassium chloride) .... Take 1 tablets by mouth twice a day 4)  Albuterol Sulfate (2.5 Mg/26ml) 0.083% Nebu (Albuterol sulfate) .... Four times daily or every 6 hours as needed 5)  Nexium 40 Mg Cpdr (Esomeprazole magnesium) .... Take 1 tablet by mouth once a day 6)  Advair Diskus 500-50 Mcg/dose Misc (Fluticasone-salmeterol) .... One puff twice daily 7)  Alprazolam 0.5 Mg Tabs (Alprazolam) .... Take one tab by mouth every 8 hours as needed 8)  Vitamin C 500 Mg Tabs (Ascorbic acid) .[name].. 1 by mouth daily 9)  Fish Oil 1200 Mg Caps (Omega-3 fatty acids) .[name].. 1 two times a day 10)  Vitamin E Complex 400 Unit Caps (Vitamin e) .... 2 by mouth daily 11)  Centrum Silver Tabs (Multiple vitamins-minerals) .[name].. 1 by mouth daily 12)  Benzonatate 100 Mg Caps (Benzonatate) .... 2 by mouth every 8 hours as needed 13)  Prednisone 10 Mg Tabs (Prednisone) .[name].. 1 once daily 14)  Magic Mouth Wash  .Marland KitchenMarland KitchenMarland Kitchen 15ml swish gargle expectorate three times daily as needed 15)  Microgaurd Powder  .... As directed  Other Orders: Est. Patient Level III (04540)  Patient Instructions: 1)  No change in medications 2)  Return 3 months 3)  Have the Hospice RN show you how to operate the flutter  valve  Appended Document: Pulmonary OV fax [facility]

## 2010-10-04 NOTE — Miscellaneous (Signed)
Summary: Physician's Interim Order/[facility] at [facility] Interim Order/Hospice at [facility]   Imported By: Maryln Gottron 09/05/2009 10:27:56  _____________________________________________________________________  External Attachment:    Type:   Image     Comment:   External Document

## 2010-10-04 NOTE — Miscellaneous (Signed)
Summary: Rx order/[facility]  Rx order/[place]   Imported By: Lester [place] 09/15/2009 12:41:14  _____________________________________________________________________  External Attachment:    Type:   Image     Comment:   External Document

## 2010-10-04 NOTE — Progress Notes (Signed)
Summary: can not afford nexium- ATC NA   Phone Note Call from Patient Call back at Home Phone [phone]   Caller: Patient Summary of Call: pt called primary care stating she needs refill of Nexium but she has hit the donut hole and can't afford Rx.   Rx is with pulmonary. will forward note to pulmonary. Initial call taken by: Alysia Penna,  July 13, 2010 9:28 AM  Follow-up for Phone Call        ATC pt back, NA and no option to leave a msg. Vernie Murders  July 13, 2010 1:58 PM   Additional Follow-up for Phone Call Additional follow up Details #1::        Spoke with pt.  She states that she can not afford nexium b/c she is in the donut hole.  She is requesting samples but we have none.  She would like to know if she can have sample of another reflux med.  Pls advise thanks Additional Follow-up by: Vernie Murders,  July 13, 2010 3:18 PM    Additional Follow-up for Phone Call Additional follow up Details #2::    prilosec 20mg  once daily otc  is fine  Follow-up by: Rubye Oaks NP,  July 13, 2010 5:07 PM  Additional Follow-up for Phone Call Additional follow up Details #3:: Details for Additional Follow-up Action Taken: Spoke with pt and notified the above.  Pt verbalized understanding. Additional Follow-up by: Vernie Murders,  July 13, 2010 5:09 PM

## 2010-10-04 NOTE — Assessment & Plan Note (Signed)
Summary: Pulmonary OV   Primary Provider/Referring Provider:  Dr. Sanda Linger  CC:  2 month follow up.  states breathing is worse.  Having increased SOB when under stress and with activity x86month. states she is coughing a lot of clear mucus up first thing in the morning.  denies wheezing and chest tightness.  [name]  History of Present Illness: 70 yo WF with advanced COPD Golds Stage III  oxygen dependent here for pulm f/u.  The pt was hospitalized in 12/08 with acute on chronic resp failure.   A sleep study was performed in 12/08.  By report the pt states showed minimal restless leg syndrome but no significant sleep disorder.   August 23, 2009 8:55 AM The pt now has thrush and is on diflucan.   The dyspnea is the same and there is less cough.  There is less mucous production. Hospice helps with home care. The pt remains on oxygen.     October 30, 2009 2:17 PM copd. f/u.  The pt is in hospice and notes   with more stress is worse,  worse with exertion,  mothers in snf.  There is some mucous but is clear in the am, no chest pain . Pt denies any increase in rescue therapy over baseline, denies waking up needing it or having any early am or nocturnal exacerbations of coughing/wheezing/or dyspnea. Pt denies any significant sore throat, nasal congestion or excess secretions, fever, chills, sweats, unintended weight loss, pleurtic or exertional chest pain, orthopnea PND, or leg swelling   August 23, 2009 8:55 AM The pt now has thrush and is on diflucan.   The dyspnea is the same and there is less cough.  There is less mucous production. Hospice helps with home care. The pt remains on oxygen.     Preventive Screening-Counseling & Management  Alcohol-Tobacco     Smoking Status: quit > 6 months  Current Medications (verified): 1)  Bayer Low Strength 81 Mg  Tbec (Aspirin) .... Take 1 Tablet By Mouth Once A Day 2)  Lasix 40 Mg  Tabs (Furosemide) .... Take 1 Tablet By Mouth Two Times A  Day 3)  Klor-Con 20 Meq  Pack (Potassium Chloride) .... Take 1 Tablets By Mouth Twice A Day 4)  Spiriva Handihaler 18 Mcg  Caps (Tiotropium Bromide Monohydrate) .... Inhale Contents of 1 Capsule Once A Day 5)  Albuterol Sulfate (2.5 Mg/61ml) 0.083%  Nebu (Albuterol Sulfate) .... Four Times Daily or Every 6 Hours As Needed 6)  Nexium 40 Mg  Cpdr (Esomeprazole Magnesium) .... Take 1 Tablet By Mouth Once A Day 7)  Advair Diskus 500-50 Mcg/dose  Misc (Fluticasone-Salmeterol) .... One Puff Twice Daily 8)  Alprazolam 0.5 Mg  Tabs (Alprazolam) .... Take One Tab By Mouth Every 8 Hours As Needed 9)  Pravastatin Sodium 40 Mg Tabs (Pravastatin Sodium) .[name].. 1 By Mouth Daily 10)  Vitamin C 500 Mg Tabs (Ascorbic Acid) .[name].. 1 By Mouth Daily 11)  Fish Oil 1200 Mg Caps (Omega-3 Fatty Acids) .[name].. 1 Two Times A Day 12)  Vitamin E Complex 400 Unit Caps (Vitamin E) .... 2 By Mouth Daily 13)  Centrum Silver  Tabs (Multiple Vitamins-Minerals) .[name].. 1 By Mouth Daily 14)  Benzonatate 100 Mg Caps (Benzonatate) .... 2 By Mouth Every 8 Hours As Needed 15)  Oxygen .[name].. 3l Continous 16)  Ventolin Hfa 108 (90 Base) Mcg/act  Aers (Albuterol Sulfate) .[name].. 1-2 Puffs Every 4-6 Hours As Needed 17)  Prednisone 10 Mg  Tabs (Prednisone) .Marland KitchenMarland KitchenMarland Kitchen  1 Once Daily 18)  Magic Mouth Wash .Marland KitchenMarland KitchenMarland Kitchen 15ml Swish Gargle Expectorate Three Times Daily As Needed 19)  Microgaurd Powder .... As Directed  Allergies (verified): 1)  ! Sulfa 2)  ! Codeine  Past History:  Past medical, surgical, family and social histories (including risk factors) reviewed, and no changes noted (except as noted below).  Past Medical History: Reviewed history from 05/10/2009 and no changes required. C O P D Hyperlipidemia gastroesophageal reflux, anxiety, obesity, breast cancer in 1998, status post chemotherapy and radiation, and right mastectomy, hypothyroidism. Surgical history.  Status post hysterectomy, cholecystectomy, hernia repair x 2.  Status post appendectomy, and  hemorrhoidectomy  Past Surgical History: Reviewed history from 11/23/2008 and no changes required. Appendectomy Cholecystectomy Hysterectomy Mastectomy Tonsillectomy  Past Pulmonary History:  Pulmonary History: COPD Golds Stage III     12/06/2008:  PFT's (COPD) Pulmonary Function Test  Date: 12/06/2008 Height (in.): 63 Gender: Female  Pre-Spirometry  FVC     Value: 1.95 L/min   Pred: 2.76 L/min     % Pred: 71 % FEV1     Value: 1.01 L     Pred: 1.97 L     % Pred: 51 % FEV1/FVC   Value: 52 %     Pred: 72 %     FEF 25-75   Value: 0.31 L/min   Pred: 2.29 L/min     % Pred: 13 %  Post-Spirometry  FVC     Value: 2.08 L/min   Pred: 2.76 L/min     % Pred: 75 % FEV1     Value: 1.16 L     Pred: 1.97 L     % Pred: 59 % FEV1/FVC   Value: 56 %     Pred: 72 %     FEF 25-75   Value: 0.46 L/min   Pred: 2.29 L/min     % Pred: 20 %  Lung Volumes  TLC     Value: 4.93 L   % Pred: 106 % RV     Value: 2.86 L   % Pred: 156 % DLCO     Value: 12.8 %   % Pred: 53 % DLCO/VA   Value: 4.35 %   % Pred: 122 %  Evaluation:  severe obstruction with significant bronchodilator response  Polysomnogram 12/08: No evidence of sleep apnea Nocturnal desat corrected with 3L [place] oxygen  Family History: Reviewed history from 09/15/2007 and no changes required. non contributory  Social History: Reviewed history from 11/23/2008 and no changes required. Patient states former smoker.  Retired Widow/Widower Alcohol use-no Drug use-no Regular exercise-no  Review of Systems       The patient complains of shortness of breath with activity and non-productive cough.  The patient denies shortness of breath at rest, productive cough, coughing up blood, chest pain, irregular heartbeats, acid heartburn, indigestion, loss of appetite, weight change, abdominal pain, difficulty swallowing, sore throat, tooth/dental problems, headaches, nasal congestion/difficulty breathing through nose, sneezing, itching, ear ache,  anxiety, depression, hand/feet swelling, joint stiffness or pain, rash, change in color of mucus, and fever.    Vital Signs:  Patient profile:   70 year old female Height:      62 inches Weight:      229.38 pounds BMI:     42.11 O2 Sat:      92 % on 1.5 L/minpulsed Temp:     97.5 degrees F oral Pulse rate:   89 / minute BP sitting:   116 / 74  (  left arm) Cuff size:   large  Vitals Entered By: Gweneth Dimitri RN (October 30, 2009 2:05 PM)  O2 Flow:  1.5 L/minpulsed CC: 2 month follow up.  states breathing is worse.  Having increased SOB when under stress and with activity x66month. states she is coughing a lot of clear mucus up first thing in the morning.  denies wheezing and chest tightness.   Comments Medications reviewed with patient Daytime contact number verified with patient. Gweneth Dimitri RN  October 30, 2009 2:06 PM    Physical Exam  Additional Exam:  Gen: Pleasant, obese,  in no distress ENT: no lesions, no postnasal drip, apthous ulcer lower lip internal,  thrush is healing Neck: No JVD, no TMG, no carotid bruits Lungs: no use of accessory muscles, no dullness to percussion, distant BS, no wheeze, no rhonchi Cardiovascular: RRR, heart sounds normal, no murmurs or gallops, no peripheral edema Musculoskeletal: No deformities, no cyanosis or clubbing      Impression & Recommendations:  Problem # 1:  CHRONIC RESPIRATORY FAILURE (ICD-518.83) Assessment Unchanged  chronic resp failure with Golds stage IV COPD plan cont hospice care cont inhaled meds as prescribed Orders: Est. Patient Level V (16109)  Medications Added to Medication List This Visit: 1)  Alprazolam 0.5 Mg Tabs (Alprazolam) .... Take one tab by mouth every 8 hours as needed 2)  Magic Mouth Wash  .Marland KitchenMarland KitchenMarland Kitchen 15ml swish gargle expectorate three times daily as needed 3)  Microgaurd Powder  .... As directed  Complete Medication List: 1)  Bayer Low Strength 81 Mg Tbec (Aspirin) .... Take 1 tablet by mouth  once a day 2)  Lasix 40 Mg Tabs (Furosemide) .... Take 1 tablet by mouth two times a day 3)  Klor-con 20 Meq Pack (Potassium chloride) .... Take 1 tablets by mouth twice a day 4)  Spiriva Handihaler 18 Mcg Caps (Tiotropium bromide monohydrate) .... Inhale contents of 1 capsule once a day 5)  Albuterol Sulfate (2.5 Mg/38ml) 0.083% Nebu (Albuterol sulfate) .... Four times daily or every 6 hours as needed 6)  Nexium 40 Mg Cpdr (Esomeprazole magnesium) .... Take 1 tablet by mouth once a day 7)  Advair Diskus 500-50 Mcg/dose Misc (Fluticasone-salmeterol) .... One puff twice daily 8)  Alprazolam 0.5 Mg Tabs (Alprazolam) .... Take one tab by mouth every 8 hours as needed 9)  Pravastatin Sodium 40 Mg Tabs (Pravastatin sodium) .[name].. 1 by mouth daily 10)  Vitamin C 500 Mg Tabs (Ascorbic acid) .[name].. 1 by mouth daily 11)  Fish Oil 1200 Mg Caps (Omega-3 fatty acids) .[name].. 1 two times a day 12)  Vitamin E Complex 400 Unit Caps (Vitamin e) .... 2 by mouth daily 13)  Centrum Silver Tabs (Multiple vitamins-minerals) .[name].. 1 by mouth daily 14)  Benzonatate 100 Mg Caps (Benzonatate) .... 2 by mouth every 8 hours as needed 15)  Oxygen  .[name].. 3l continous 16)  Ventolin Hfa 108 (90 Base) Mcg/act Aers (Albuterol sulfate) .[name].. 1-2 puffs every 4-6 hours as needed 17)  Prednisone 10 Mg Tabs (Prednisone) .[name].. 1 once daily 18)  Magic Mouth Wash  .Marland KitchenMarland KitchenMarland Kitchen 15ml swish gargle expectorate three times daily as needed 19)  Microgaurd Powder  .... As directed  Other Orders: Est. Patient Level III (60454)  Patient Instructions: 1)  No change in medications 2)  Return 2 months Prescriptions: PREDNISONE 10 MG  TABS (PREDNISONE) 1 once daily  #100 x 6   Entered and Authorized by:   Storm Frisk MD   Signed by:  Storm Frisk MD on 10/30/2009   Method used:   [facility] to        [facility]  [address] [phone]* (retail)       [address]       [place], [place]  [address]       Ph:  [phone] or [phone]       Fax: [phone]   RxID:   [id number] SPIRIVA HANDIHALER 18 MCG  CAPS (TIOTROPIUM BROMIDE MONOHYDRATE) Inhale contents of 1 capsule once a day Brand medically necessary #30 x 6   Entered and Authorized by:   Storm Frisk MD   Signed by:   Storm Frisk MD on 10/30/2009   Method used:   [facility]        [facility]  [address] [phone]* (retail)       [address]       [place], [place]  [address]       Ph: [phone] or [phone]       Fax: [phone]   RxID:   [id number] ALPRAZOLAM 0.5 MG  TABS (ALPRAZOLAM) take one tab by mouth every 8 hours as needed  #90 x 6   Entered and Authorized by:   Storm Frisk MD   Signed by:   Storm Frisk MD on 10/30/2009   Method used:   Print then Give to Patient   RxID:   [id number]

## 2010-10-04 NOTE — Progress Notes (Signed)
Summary: cough  Phone Note From Other Clinic Call back at [phone]   Caller: Patient Call For: [facility] Caller: hospice  jeniffer Summary of Call: pt would like tussalon pearls for cough [facility] [address] Initial call taken by: Rickard Patience,  October 13, 2009 8:30 AM  Follow-up for Phone Call        Med was refilled.  Spoke with Victorino Dike and made aware that this was done. Follow-up by: Vernie Murders,  October 13, 2009 9:22 AM    Prescriptions: BENZONATATE 100 MG CAPS (BENZONATATE) 2 by mouth every 8 hours as needed  #90 x 1   Entered by:   Vernie Murders   Authorized by:   Storm Frisk MD   Signed by:   Vernie Murders on 10/13/2009   Method used:   [facility]        [facility]  [address] [phone]* (retail)       [address]       [place], [place]  [address]       Ph: [phone] or [phone]       Fax: [phone]   RxID:   [id number]

## 2010-10-04 NOTE — Miscellaneous (Signed)
Summary: Physician's Interim Order/Hospice @ [facility]  Physician's Interim Order/Hospice @ [facility]   Imported By: Sherian Rein 10/24/2009 14:12:21  _____________________________________________________________________  External Attachment:    Type:   Image     Comment:   External Document

## 2010-10-04 NOTE — Assessment & Plan Note (Signed)
Summary: f/u appt in oct/#/cd   Vital Signs:  Patient profile:   70 year old female Height:      62 inches (157.48 cm) Weight:      203.0 pounds (92.27 kg) Temp:     98.4 degrees F (36.89 degrees C) oral Pulse rate:   72 / minute BP sitting:   110 / 68  (left arm) Cuff size:   regular  Vitals Entered By: Orlan Leavens RMA (June 26, 2010 1:06 PM) CC: 3 month follow-up Is Patient Diabetic? Yes Did you bring your meter with you today? Yes CBG Result 114 CBG Device ID pt check this am at home  4  Primary Care Provider:  Newt Lukes MD  CC:  3 month follow-up.  History of Present Illness: here for f/u  new onset DM -  dx 03/13/10 by routine pulm labs started on metformin + OSA 03/2010 reports compliance with ongoing medical treatment and no changes in medication dose or frequency. denies adverse side effects related to current therapy. no hypoglycemic events or symptoms - checks sugars 2x/day - home cbg log reviewed - range 101-170s, lowest in AM   COPD - advanced dz, freq exac - enrolled with hospice for same since 06/2009 - chroni pred + cont O2 use - daily DOE but no cough or flares at this time - reports compliance with ongoing medical treatment and no changes in medication dose or frequency. denies adverse side effects related to current therapy.   breast cancer - >32yr out - dx 1998 s/p chemo and xrt - s/p r mastect for same -   anxiety - largely realetd to trouble breathing and fear of being alone - "i have no one" since spouse expired and children are "distantly involved" per her report - uses BZ as needed -   Clinical Review Panels:  Diabetes Management   HgBA1C:  14.8 (03/13/2010)   Creatinine:  0.8 (03/13/2010)   Last Foot Exam:  yes (06/26/2010)   Last Flu Vaccine:  Fluvax MCR (06/19/2009)   Last Pneumovax:  Pneumovax (06/14/2008)  CBC   WBC:  8.5 (03/13/2010)   RBC:  4.67 (03/13/2010)   Hgb:  16.3 (03/13/2010)   Hct:  46.5 (03/13/2010)   Platelets:   216.0 (03/13/2010)   MCV  99.7 (03/13/2010)   MCHC  34.9 (03/13/2010)   RDW  13.3 (03/13/2010)   PMN:  81.2 (03/13/2010)   Lymphs:  14.6 (03/13/2010)   Monos:  3.7 (03/13/2010)   Eosinophils:  0.4 (03/13/2010)   Basophil:  0.1 (03/13/2010)  Complete Metabolic Panel   Glucose:  542 (03/13/2010)   Sodium:  137 (03/13/2010)   Potassium:  5.0 (03/13/2010)   Chloride:  97 (03/13/2010)   CO2:  30 (03/13/2010)   BUN:  9 (03/13/2010)   Creatinine:  0.8 (03/13/2010)   Albumin:  3.9 (09/06/2008)   Total Protein:  7.2 (09/06/2008)   Calcium:  8.8 (03/13/2010)   Total Bili:  0.7 (09/06/2008)   Alk Phos:  64 (09/06/2008)   SGPT (ALT):  23 (09/06/2008)   SGOT (AST):  24 (09/06/2008)   Current Medications (verified): 1)  Bayer Low Strength 81 Mg  Tbec (Aspirin) .... Take 1 Tablet By Mouth Once A Day 2)  Lasix 40 Mg  Tabs (Furosemide) .... Take 1 Tablet By Mouth Two Times A Day 3)  Klor-Con 20 Meq  Pack (Potassium Chloride) .... Take 1 Tablets By Mouth Twice A Day 4)  Albuterol Sulfate (2.5 Mg/12ml) 0.083%  Nebu (Albuterol  Sulfate) .... Four Times Daily or Every 6 Hours As Needed 5)  Nexium 40 Mg  Cpdr (Esomeprazole Magnesium) .... Take 1 Tablet By Mouth Once A Day 6)  Advair Diskus 500-50 Mcg/dose  Misc (Fluticasone-Salmeterol) .... One Puff Twice Daily 7)  Alprazolam 0.5 Mg  Tabs (Alprazolam) .... Take One Tab By Mouth Every 8 Hours As Needed 8)  Vitamin C 500 Mg Tabs (Ascorbic Acid) .[name].. 1 By Mouth Daily 9)  Fish Oil 1200 Mg Caps (Omega-3 Fatty Acids) .[name].. 1 Two Times A Day 10)  Vitamin E Complex 400 Unit Caps (Vitamin E) .... 2 By Mouth Daily 11)  Centrum Silver  Tabs (Multiple Vitamins-Minerals) .[name].. 1 By Mouth Daily 12)  Benzonatate 100 Mg Caps (Benzonatate) .... 2 By Mouth Every 8 Hours As Needed 13)  Prednisone 10 Mg  Tabs (Prednisone) .[name].. 1 Once Daily 14)  Magic Mouth Wash .Marland KitchenMarland KitchenMarland Kitchen 15ml Swish Gargle Expectorate Three Times Daily As Needed 15)  Microgaurd Powder .... As Directed 16)   Metformin Hcl 500 Mg Xr24h-Tab (Metformin Hcl) .[name].. 1 By Mouth Two Times A Day 17)  Glimepiride 2 Mg Tabs (Glimepiride) .... 1/2 By Mouth  Every Morning 18)  Onetouch Ultra Test  Strp (Glucose Blood) .... Check Blood Sugar Two Times A Day  Dx: 250.00 19)  Onetouch Lancets  Misc (Lancets) .... Use Two Times A Day  Dx: 250.00 20)  Ventolin Hfa 108 (90 Base) Mcg/act Aers (Albuterol Sulfate) .... 2 Puffs Every 4-6 Hours As Needed 21)  Senokot S 8.6-50 Mg Tabs (Sennosides-Docusate Sodium) .[name].. 1-2 By Mouth At Bedtime As Needed For Constipation 22)  Pravastatin Sodium 40 Mg Tabs (Pravastatin Sodium) .[name].. 1 Tab By Mouth Once Daily  Allergies (verified): 1)  ! Sulfa 2)  ! Codeine  Past History:  Past Medical History: C O P D - chronic O2 + steroid Hyperlipidemia gastroesophageal reflux anxiety obesity right breast cancer in 1998, s/p chemo + XRT, and right mastectomy hypothyroidism. Diabetes, Type 2     -03/2010: Hgb A1C 14.8, BS 540   MD roster: pulm - [facility]  Social History: former smoker.  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    Physical Exam  General:  overweight-appearing.  alert, well-developed, well-nourished, and cooperative to examination.   wearing [place] O2 Lungs:  normal respiratory effort, no intercostal retractions or use of accessory muscles; diminished breath sounds bilaterally -but no crackles and no wheezes.    Heart:  normal rate, regular rhythm, no murmur, and no rub. BLE without edema.   Diabetes Management Exam:    Foot Exam (with socks and/or shoes not present):       Sensory-Pinprick/Light touch:          Left medial foot (L-4): normal          Left dorsal foot (L-5): normal          Left lateral foot (S-1): normal          Right medial foot (L-4): normal          Right  dorsal foot (L-5): normal          Right lateral foot (S-1): normal       Inspection:          Left foot: abnormal  Comments: heel with plantar wart          Right foot: normal   Impression & Recommendations:  Problem # 1:  DIABETES, TYPE 2 (ICD-250.00)  Her updated medication list for this problem includes:    Bayer Low Strength 81 Mg Tbec (Aspirin) .[name]... Take 1 tablet by mouth once a day    Metformin Hcl 500 Mg Xr24h-tab (Metformin hcl) .[name]... 1 by mouth two times a day    Glimepiride 2 Mg Tabs (Glimepiride) .[name]... 1/2 by mouth  every morning  new dx 03/13/10, uncontrolled  - likely related to steroid use (which is unlikely to change given pulm dz) rec eval by podiatry to treat painful plantars wart - pt wishes to wait until new year 2012 - will f/u next OV Time spent with patient 25 minutes, more than 50% of this time was spent counseling patient on diabetes - dx, meds and diet and review of home cbg  Orders: TLB-A1C / Hgb A1C (Glycohemoglobin) (83036-A1C)  Problem # 2:  HAIR LOSS (ICD-704.00) no alopecia on exam - check tsh Orders: TLB-TSH (Thyroid Stimulating Hormone) (84443-TSH)  Problem # 3:  CHRONIC RESPIRATORY FAILURE (WNU-272.53)  see discussion with COPD as below chronic resp failure with Golds stage IV COPD cont hospice care cont inhaled meds as prescribed  Problem # 4:  C O P D (ICD-496)  Her updated medication list for this problem includes:    Albuterol Sulfate (2.5 Mg/31ml) 0.083% Nebu (Albuterol sulfate) .[name]... Four times daily or every 6 hours as needed    Advair Diskus 500-50 Mcg/dose Misc (Fluticasone-salmeterol) ..... One puff twice daily    Ventolin Hfa 108 (90 Base) Mcg/act Aers (Albuterol sulfate) .[name]... 2 puffs every 4-6 hours as needed  Her updated medication list for this problem includes:    Albuterol Sulfate (2.5 Mg/82ml) 0.083% Nebu (Albuterol sulfate) .[name]... Four times daily or every 6 hours as needed    Advair Diskus 500-50 Mcg/dose  Misc (Fluticasone-salmeterol) ..... One puff twice daily  COPD end stage IV - followed by pulm + home hospice for same  Pulmonary Functions Reviewed: FEV1: 1.01 (12/06/2008)   FEV 25-75: 0.31 (12/06/2008)   O2 sat: 92 (03/14/2010)     Vaccines Reviewed: Pneumovax: Pneumovax (06/14/2008)   Flu Vax: Fluvax MCR (06/19/2009)  Problem # 5:  HYPERLIPIDEMIA (ICD-272.4)  Her updated medication list for this problem includes:    Pravastatin Sodium 40 Mg Tabs (Pravastatin sodium) .[name]... 1 tab by mouth once daily  Orders: TLB-Lipid Panel (80061-LIPID)  Complete Medication List: 1)  Bayer Low Strength 81 Mg Tbec (Aspirin) .... Take 1 tablet by mouth once a day 2)  Lasix 40 Mg Tabs (Furosemide) .... Take 1 tablet by mouth two times a day 3)  Klor-con 20 Meq Pack (Potassium chloride) .... Take 1 tablets by mouth twice a day 4)  Albuterol Sulfate (2.5 Mg/45ml) 0.083% Nebu (Albuterol sulfate) .... Four times daily or every 6 hours as needed 5)  Nexium 40 Mg Cpdr (Esomeprazole magnesium) .... Take 1 tablet by mouth once a day 6)  Advair Diskus 500-50 Mcg/dose Misc (Fluticasone-salmeterol) .... One puff twice daily 7)  Alprazolam 0.5 Mg Tabs (Alprazolam) .... Take one tab by mouth every 8 hours as needed 8)  Vitamin C 500 Mg Tabs (Ascorbic acid) .[name].. 1 by mouth daily 9)  Fish Oil 1200 Mg Caps (Omega-3 fatty acids) .[name].. 1 two times a day 10)  Vitamin E Complex 400 Unit Caps (Vitamin e) .... 2 by mouth daily 11)  Centrum Silver Tabs (Multiple vitamins-minerals) .[name].. 1 by mouth daily 12)  Benzonatate 100 Mg Caps (Benzonatate) .... 2 by mouth every 8 hours as needed 13)  Prednisone 10 Mg Tabs (Prednisone) .[name].. 1 once daily 14)  Magic Mouth Wash  .Marland KitchenMarland KitchenMarland Kitchen 15ml swish gargle expectorate three times daily as needed 15)  Microgaurd Powder  .... As directed 16)  Metformin Hcl 500 Mg Xr24h-tab (Metformin hcl) .[name].. 1 by mouth two times a day 17)  Glimepiride 2 Mg Tabs (Glimepiride) .... 1/2 by mouth  every  morning 18)  Onetouch Ultra Test Strp (Glucose blood) .... Check blood sugar two times a day  dx: 250.00 19)  Onetouch Lancets Misc (Lancets) .... Use two times a day  dx: 250.00 20)  Ventolin Hfa 108 (90 Base) Mcg/act Aers (Albuterol sulfate) .... 2 puffs every 4-6 hours as needed 21)  Senokot S 8.6-50 Mg Tabs (Sennosides-docusate sodium) .[name].. 1-2 by mouth at bedtime as needed for constipation 22)  Pravastatin Sodium 40 Mg Tabs (Pravastatin sodium) .[name].. 1 tab by mouth once daily  Patient Instructions: 1)  it was good to see you today. 2)  test(s) ordered today - your results will be posted on the phone tree for review in 48-72 hours from the time of test completion; call [phone] and enter your 9 digit MRN (listed above on this page, just below your name); if any changes need to be made or there are abnormal results, you will be contacted directly. 3)  medications reviewed - no chnages today - 4)  will plan refer to podiatry next visit, call sooner if needed 5)  Please schedule a follow-up appointment in 3 months to continue diabetes review, call sooner if problems.    Orders Added: 1)  Est. Patient Level IV [09811] 2)  TLB-A1C / Hgb A1C (Glycohemoglobin) [83036-A1C] 3)  TLB-TSH (Thyroid Stimulating Hormone) [84443-TSH] 4)  TLB-Lipid Panel [80061-LIPID]

## 2010-10-04 NOTE — Miscellaneous (Signed)
Summary: Treatment Plans/Hospice @ [facility]  Treatment Plans/Hospice @ [place] [facility]   Imported By: Sherian Rein 08/29/2010 15:11:19  _____________________________________________________________________  External Attachment:    Type:   Image     Comment:   External Document

## 2010-10-04 NOTE — Progress Notes (Signed)
Summary: Rx refill req  Phone Note Call from Patient Call back at Home Phone [phone]   Caller: Patient Summary of Call: Pt called requesting refills of Pravastain. Medication last Rxd by Dr Luisa Hart [facility]'s office 10/02/2009 #30 x 5 but was removed from medication list. Okay to add and refill? Initial call taken by: Margaret Pyle, CMA,  April 16, 2010 2:14 PM  Follow-up for Phone Call        sure - ok to fill as prev rx'd - will check FLP next OV- thx Follow-up by: Newt Lukes MD,  April 16, 2010 2:49 PM  Additional Follow-up for Phone Call Additional follow up Details #1::        Pt informed Additional Follow-up by: Margaret Pyle, CMA,  April 16, 2010 3:05 PM    New/Updated Medications: PRAVASTATIN SODIUM 40 MG TABS (PRAVASTATIN SODIUM) 1 tab by mouth once daily Prescriptions: PRAVASTATIN SODIUM 40 MG TABS (PRAVASTATIN SODIUM) 1 tab by mouth once daily  #30 x 5   Entered by:   Margaret Pyle, CMA   Authorized by:   Newt Lukes MD   Signed by:   Margaret Pyle, CMA on 04/16/2010   Method used:   [facility]        [facility]  [address] [phone]* (retail)       [address]       [place], [place]  [id number]       Ph: [phone] or [phone]       Fax: [phone]   RxID:   [id number]

## 2010-10-04 NOTE — Assessment & Plan Note (Signed)
Summary: 2 WK ROV /NWS   Vital Signs:  Patient profile:   70 year old female Height:      62 inches (157.48 cm) Weight:      208.0 pounds (94.55 kg) O2 Sat:      93 % on Room air Temp:     98.3 degrees F (36.83 degrees C) oral Pulse rate:   94 / minute BP sitting:   120 / 72  (left arm) Cuff size:   regular  Vitals Entered By: Orlan Leavens RMA (March 28, 2010 1:15 PM)  O2 Flow:  Room air CC: 2 WEEK FOLLOW-UP Is Patient Diabetic? Yes Did you bring your meter with you today? Yes Pain Assessment Patient in pain? no        Primary Care Provider:  Dr. Sanda Linger  CC:  2 WEEK FOLLOW-UP.  History of Present Illness: here for f/u  new onset DM -  dx 03/13/10 by routine pulm labs no known dx same but told she had high sugar levels during 2008 hospitalization - c/o freq urination and freq thirst - also associated with blurred vision but no vision loss also +yeast infx - vaginal and skin folds, chronic onset symptoms >6 months ago risk factors include chronic pred use for lung dz - but denies recent increase in dose denies weight changes - started on metformin + OSA 2 weeks ago - reports compliance with ongoing medical treatment and no changes in medication dose or frequency. denies adverse side effects related to current therapy. no hypoglycemic events or symptoms - checks sugars 2x/day - home cbg log reviewed - range 76-200s, lowest in AM (always <100)  COPD - advanced dz, freq exac - enrolled with hospice for same since 06/2009 - chroni pred + cont O2 use - daily DOE but no cough or flares at thsi time - reports compliance with ongoing medical treatment and no changes in medication dose or frequency. denies adverse side effects related to current therapy.   breast cancer - >5yr out - dx 1998 s/p chemo and xrt - s/p r mastect for same -   anxiety - largely realetd to trouble breathing and fear of being alone - "i have no one" since spouse expired adn children or distantly  involved per her report - uses BZ as needed -   Current Medications (verified): 1)  Bayer Low Strength 81 Mg  Tbec (Aspirin) .... Take 1 Tablet By Mouth Once A Day 2)  Lasix 40 Mg  Tabs (Furosemide) .... Take 1 Tablet By Mouth Two Times A Day 3)  Klor-Con 20 Meq  Pack (Potassium Chloride) .... Take 1 Tablets By Mouth Twice A Day 4)  Albuterol Sulfate (2.5 Mg/55ml) 0.083%  Nebu (Albuterol Sulfate) .... Four Times Daily or Every 6 Hours As Needed 5)  Nexium 40 Mg  Cpdr (Esomeprazole Magnesium) .... Take 1 Tablet By Mouth Once A Day 6)  Advair Diskus 500-50 Mcg/dose  Misc (Fluticasone-Salmeterol) .... One Puff Twice Daily 7)  Alprazolam 0.5 Mg  Tabs (Alprazolam) .... Take One Tab By Mouth Every 8 Hours As Needed 8)  Vitamin C 500 Mg Tabs (Ascorbic Acid) .[name].. 1 By Mouth Daily 9)  Fish Oil 1200 Mg Caps (Omega-3 Fatty Acids) .[name].. 1 Two Times A Day 10)  Vitamin E Complex 400 Unit Caps (Vitamin E) .... 2 By Mouth Daily 11)  Centrum Silver  Tabs (Multiple Vitamins-Minerals) .[name].. 1 By Mouth Daily 12)  Benzonatate 100 Mg Caps (Benzonatate) .... 2 By Mouth Every 8  Hours As Needed 13)  Prednisone 10 Mg  Tabs (Prednisone) .[name].. 1 Once Daily 14)  Magic Mouth Wash .Marland KitchenMarland KitchenMarland Kitchen 15ml Swish Gargle Expectorate Three Times Daily As Needed 15)  Microgaurd Powder .... As Directed 16)  Metformin Hcl 500 Mg Xr24h-Tab (Metformin Hcl) .[name].. 1 By Mouth Two Times A Day 17)  Glimepiride 2 Mg Tabs (Glimepiride) .[name].. 1 By Mouth Two Times A Day 18)  Onetouch Ultra Test  Strp (Glucose Blood) .... Check Blood Sugar Two Times A Day  Dx: 250.00 19)  Onetouch Lancets  Misc (Lancets) .... Use Two Times A Day  Dx: 250.00 20)  Ventolin Hfa 108 (90 Base) Mcg/act Aers (Albuterol Sulfate) .... 2 Puffs Every 4-6 Hours As Needed  Allergies (verified): 1)  ! Sulfa 2)  ! Codeine  Past History:  Past Medical History: C O P D - chronic O2 + steroid Hyperlipidemia gastroesophageal reflux anxiety obesity right breast cancer in 1998, s/p chemo  + XRT, and right mastectomy hypothyroidism. Diabetes, Type 2     -7/11: Hgb A1C 14.8, BS 540   MD roster: pulm - wright  Review of Systems  The patient denies anorexia, chest pain, syncope, and headaches.         continued problems with itch from yeast - better since taking diflucan, ?repeat tx  Physical Exam  General:  overweight-appearing.  alert, well-developed, well-nourished, and cooperative to examination.   wearing [place] O2 Lungs:  normal respiratory effort, no intercostal retractions or use of accessory muscles; diminished breath sounds bilaterally -but no crackles and no wheezes.    Heart:  normal rate, regular rhythm, no murmur, and no rub. BLE without edema.  Psych:  Oriented X3, memory intact for recent and remote, normally interactive, good eye contact, not anxious appearing, mild depressed appearing, and not agitated.   occ tearful during hx and exam   Impression & Recommendations:  Problem # 1:  DIABETES, TYPE 2 (ICD-250.00)  Her updated medication list for this problem includes:    Bayer Low Strength 81 Mg Tbec (Aspirin) .[name]... Take 1 tablet by mouth once a day    Metformin Hcl 500 Mg Xr24h-tab (Metformin hcl) .[name]... 1 by mouth two times a day    Glimepiride 2 Mg Tabs (Glimepiride) .[name]... 1 by mouth  every morning  new dx 03/13/10, uncontrolled  - likely related to steroid use (which is unlikely to change given pulm dz) initiated oral meds as pt wishes to avoid insulin  or other "shots"-  as Cr normal, use metform + SU labs reviewed no HONK or DKA  on recent labs and clinically nontoxic - education initiated - glucometer provided upcoming formal DM teaching - through hospice if availbale, otherwise nutrition educators s/p eye check with dr. Emily Filbert - reports no dm damage Time spent with patient 25 minutes, more than 50% of this time was spent counseling patient on diabetes - dx, meds and diet and review of home cbg  Her updated medication list for this problem  includes:    Bayer Low Strength 81 Mg Tbec (Aspirin) .[name]... Take 1 tablet by mouth once a day    Metformin Hcl 500 Mg Xr24h-tab (Metformin hcl) .[name]... 1 by mouth two times a day    Glimepiride 2 Mg Tabs (Glimepiride) .[name]... 1 by mouth two times a day  Orders: Glucose, (CBG) [phone])  Labs Reviewed: Creat: 0.8 (03/13/2010)    Reviewed HgBA1c results: 14.8 (03/13/2010)  Problem # 2:  CANDIDIASIS OF UNSPECIFIED SITE (ICD-112.9)  Orders: Prescription Created  Electronically [id number])  hx recurrent thrush - also vaginal and skin - likely related to uncontrolled DM2 - tx diflucan x 3 days and continue DM tx as above -  Complete Medication List: 1)  Bayer Low Strength 81 Mg Tbec (Aspirin) .... Take 1 tablet by mouth once a day 2)  Lasix 40 Mg Tabs (Furosemide) .... Take 1 tablet by mouth two times a day 3)  Klor-con 20 Meq Pack (Potassium chloride) .... Take 1 tablets by mouth twice a day 4)  Albuterol Sulfate (2.5 Mg/66ml) 0.083% Nebu (Albuterol sulfate) .... Four times daily or every 6 hours as needed 5)  Nexium 40 Mg Cpdr (Esomeprazole magnesium) .... Take 1 tablet by mouth once a day 6)  Advair Diskus 500-50 Mcg/dose Misc (Fluticasone-salmeterol) .... One puff twice daily 7)  Alprazolam 0.5 Mg Tabs (Alprazolam) .... Take one tab by mouth every 8 hours as needed 8)  Vitamin C 500 Mg Tabs (Ascorbic acid) .[name].. 1 by mouth daily 9)  Fish Oil 1200 Mg Caps (Omega-3 fatty acids) .[name].. 1 two times a day 10)  Vitamin E Complex 400 Unit Caps (Vitamin e) .... 2 by mouth daily 11)  Centrum Silver Tabs (Multiple vitamins-minerals) .[name].. 1 by mouth daily 12)  Benzonatate 100 Mg Caps (Benzonatate) .... 2 by mouth every 8 hours as needed 13)  Prednisone 10 Mg Tabs (Prednisone) .[name].. 1 once daily 14)  Magic Mouth Wash  .Marland KitchenMarland KitchenMarland Kitchen 15ml swish gargle expectorate three times daily as needed 15)  Microgaurd Powder  .... As directed 16)  Metformin Hcl 500 Mg Xr24h-tab (Metformin hcl) .[name].. 1 by mouth two times a day 17)   Glimepiride 2 Mg Tabs (Glimepiride) .[name].. 1 by mouth  every morning 18)  Onetouch Ultra Test Strp (Glucose blood) .... Check blood sugar two times a day  dx: 250.00 19)  Onetouch Lancets Misc (Lancets) .... Use two times a day  dx: 250.00 20)  Ventolin Hfa 108 (90 Base) Mcg/act Aers (Albuterol sulfate) .... 2 puffs every 4-6 hours as needed 21)  Senokot S 8.6-50 Mg Tabs (Sennosides-docusate sodium) .[name].. 1-2 by mouth at bedtime as needed for constipation 22)  Fluconazole 150 Mg Tabs (Fluconazole) .[name].. 1 by mouth once daily x 3 days  Patient Instructions: 1)  it was good to see you today. 2)  continue same medications for diabetes - metformin and glmepride but reduce glimepiride to once every morning , not twice daily 3)  continue check sugars two times a day and keep record of this - bring record to next appointment  4)  call if sugars over 150 or less than 80 - 5)  also sent prescription for yeast medication 6)  Please schedule a follow-up appointment in mid October for diabetes blood check to review sugars and medications, call sooner if problems.  Prescriptions: FLUCONAZOLE 150 MG TABS (FLUCONAZOLE) 1 by mouth once daily x 3 days  #3 x 1   Entered and Authorized by:   Newt Lukes MD   Signed by:   Newt Lukes MD on 03/28/2010   Method used:   [facility]        [facility]  [address] [phone]* (retail)       [address]       [place], [place]  [address]       Ph: [phone] or [phone]       Fax: [phone]   RxID:   [id number]

## 2010-10-04 NOTE — Assessment & Plan Note (Signed)
Summary: Pulmonary OV   Primary Provider/Referring Provider:  Dr. Sanda Linger  CC:  Follow up.  SOB with activity but unchanged from last OV.  Wheezing at times, prod cough in the mornings with clear to white mucus.  Reqeuesting flu vac today. and rxs for klor con, prednisone, and benzonatate.Kaitlin Henderson  History of Present Illness: 70 yo WF with advanced COPD Golds Stage III  oxygen dependent here for pulm f/u.    March 13, 2010 2:27 PM f/u from 4/11. The pt  notes more excess urination and urine incontinence.   The pt was rx for a yeast infxn x one month between the thighs.   Hospice rx diflucan and was better.  Pt with no prior dx of DM.  Pt is on prednisone 10mg /d.   The pt had hx of high BS when on high dose steroids with copd exac in the past.  The pt.  is still in hospice.  June 26, 2010 12:01 PM Pt notes legs hurting and throbbing.  Legs  not cramping,  just throbbing no chest pain.  Dyspnea is bad with exertion.   Drove self to OV No mucus  in the am.     Current Medications (verified): 1)  Bayer Low Strength 81 Mg  Tbec (Aspirin) .... Take 1 Tablet By Mouth Once A Day 2)  Lasix 40 Mg  Tabs (Furosemide) .... Take 1 Tablet By Mouth Two Times A Day 3)  Klor-Con 20 Meq  Pack (Potassium Chloride) .... Take 1 Tablets By Mouth Twice A Day 4)  Albuterol Sulfate (2.5 Mg/47ml) 0.083%  Nebu (Albuterol Sulfate) .... Four Times Daily or Every 6 Hours As Needed 5)  Nexium 40 Mg  Cpdr (Esomeprazole Magnesium) .... Take 1 Tablet By Mouth Once A Day 6)  Advair Diskus 500-50 Mcg/dose  Misc (Fluticasone-Salmeterol) .... One Puff Twice Daily 7)  Alprazolam 0.5 Mg  Tabs (Alprazolam) .... Take One Tab By Mouth Every 8 Hours As Needed 8)  Vitamin C 500 Mg Tabs (Ascorbic Acid) .Kaitlin Henderson.. 1 By Mouth Daily 9)  Fish Oil 1200 Mg Caps (Omega-3 Fatty Acids) .Kaitlin Henderson.. 1 Two Times A Day 10)  Vitamin E Complex 400 Unit Caps (Vitamin E) .... 2 By Mouth Daily 11)  Centrum Silver  Tabs (Multiple Vitamins-Minerals) .Kaitlin Henderson.. 1 By Mouth  Daily 12)  Benzonatate 100 Mg Caps (Benzonatate) .... 2 By Mouth Every 8 Hours As Needed 13)  Prednisone 10 Mg  Tabs (Prednisone) .Kaitlin Henderson.. 1 Once Daily 14)  Magic Mouth Wash .Kaitlin KitchenMarland KitchenMarland Henderson 15ml Swish Gargle Expectorate Three Times Daily As Needed 15)  Microgaurd Powder .... As Directed 16)  Metformin Hcl 500 Mg Xr24h-Tab (Metformin Hcl) .Kaitlin Henderson.. 1 By Mouth Two Times A Day 17)  Glimepiride 2 Mg Tabs (Glimepiride) .... 1/2 By Mouth  Every Morning 18)  Onetouch Ultra Test  Strp (Glucose Blood) .... Check Blood Sugar Two Times A Day  Dx: 250.00 19)  Onetouch Lancets  Misc (Lancets) .... Use Two Times A Day  Dx: 250.00 20)  Ventolin Hfa 108 (90 Base) Mcg/act Aers (Albuterol Sulfate) .... 2 Puffs Every 4-6 Hours As Needed 21)  Senokot S 8.6-50 Mg Tabs (Sennosides-Docusate Sodium) .Kaitlin Henderson.. 1-2 By Mouth At Bedtime As Needed For Constipation 22)  Pravastatin Sodium 40 Mg Tabs (Pravastatin Sodium) .Kaitlin Henderson.. 1 Tab By Mouth Once Daily 23)  Spiriva Handihaler 18 Mcg Caps (Tiotropium Bromide Monohydrate) .... Once Daily 24)  Advil 200 Mg Tabs (Ibuprofen) .... 2 Every 6 Hours As Needed  Allergies (verified): 1)  ! Sulfa  2)  ! Codeine  Past History:  Past medical, surgical, family and social histories (including risk factors) reviewed, and no changes noted (except as noted below).  Past Medical History: Reviewed history from 03/28/2010 and no changes required. 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 Diabetes, Type 2     -7/11: Hgb A1C 14.8, BS 540   MD roster: Lendon Ka  Past Surgical History: Reviewed history from 03/14/2010 and no changes required. Appendectomy Cholecystectomy Hysterectomy  Mastectomy, right Tonsillectomy  Past Pulmonary History:  Pulmonary History: COPD Golds Stage III     12/06/2008:  PFT's (COPD) Pulmonary Function Test  Date: 12/06/2008 Height (in.):  63 Gender: Female  Pre-Spirometry  FVC     Value: 1.95 L/min   Pred: 2.76 L/min     % Pred: 71 % FEV1     Value: 1.01 L     Pred: 1.97 L     % Pred: 51 % FEV1/FVC   Value: 52 %     Pred: 72 %     FEF 25-75   Value: 0.31 L/min   Pred: 2.29 L/min     % Pred: 13 %  Post-Spirometry  FVC     Value: 2.08 L/min   Pred: 2.76 L/min     % Pred: 75 % FEV1     Value: 1.16 L     Pred: 1.97 L     % Pred: 59 % FEV1/FVC   Value: 56 %     Pred: 72 %     FEF 25-75   Value: 0.46 L/min   Pred: 2.29 L/min     % Pred: 20 %  Lung Volumes  TLC     Value: 4.93 L   % Pred: 106 % RV     Value: 2.86 L   % Pred: 156 % DLCO     Value: 12.8 %   % Pred: 53 % DLCO/VA   Value: 4.35 %   % Pred: 122 %  Evaluation:  severe obstruction with significant bronchodilator response  Polysomnogram 12/08: No evidence of sleep apnea Nocturnal desat corrected with 3L [place] oxygen  Family History: Reviewed history from 03/14/2010 and no changes required. non contributory no diabetes known  Social History: Reviewed history from 03/14/2010 and no changes required. former smoker.  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  The patient denies shortness of breath at rest, productive cough, coughing up blood, chest pain, irregular heartbeats, acid heartburn, indigestion, loss of appetite, weight change, abdominal pain, difficulty swallowing, sore throat, tooth/dental problems, headaches, nasal congestion/difficulty breathing through nose, sneezing, itching, ear ache, anxiety, depression, hand/feet swelling, joint stiffness or pain, rash, change in color of mucus, and fever.    Vital Signs:  Patient profile:   70 year old female Height:      2 inches Weight:      203.50 pounds BMI:     35898.32 O2 Sat:      94 % on 2  L/minpulsed Temp:     97.6 degrees F oral Pulse rate:   85 / minute BP sitting:   122 / 76  (left arm) Cuff size:   large  Vitals  Entered By: Gweneth Dimitri RN (June 26, 2010 11:51 AM)  O2 Flow:  2 L/minpulsed CC: Follow up.  SOB with activity but unchanged from last OV.  Wheezing at times, prod cough in the mornings with clear to white mucus.  Reqeuesting flu vac today. and rxs for klor con, prednisone, benzonatate. Comments Medications reviewed with patient Daytime contact number verified with patient. Gweneth Dimitri RN  June 26, 2010 11:45 AM    Physical Exam  Additional Exam:  Gen: Pleasant, obese,  in no distress ENT: no lesions, no postnasal drip, apthous ulcer lower lip internal,  thrush is healing Neck: No JVD, no TMG, no carotid bruits Lungs: no use of accessory muscles, no dullness to percussion, distant BS, no wheeze, no rhonchi Cardiovascular: RRR, heart sounds normal, no murmurs or gallops, no peripheral edema Musculoskeletal: No deformities, no cyanosis or clubbing     Impression & Recommendations:  Problem # 1:  C O P D (ICD-496) Assessment Unchanged  End stage copd golds stage iv plan cont hospice care no change in meds  flu vaccine  Medications Added to Medication List This Visit: 1)  Glimepiride 2 Mg Tabs (Glimepiride) .... 1/2 by mouth  every morning 2)  Ventolin Hfa 108 (90 Base) Mcg/act Aers (Albuterol sulfate) .... 2 puffs every 4-6 hours as needed 3)  Spiriva Handihaler 18 Mcg Caps (Tiotropium bromide monohydrate) .... Once daily 4)  Advil 200 Mg Tabs (Ibuprofen) .... 2 every 6 hours as needed  Complete Medication List: 1)  Bayer Low Strength 81 Mg Tbec (Aspirin) .... Take 1 tablet by mouth once a day 2)  Lasix 40 Mg Tabs (Furosemide) .... Take 1 tablet by mouth two times a day 3)  Klor-con 20 Meq Pack (Potassium chloride) .... Take 1 tablets by mouth twice a day 4)  Albuterol Sulfate (2.5 Mg/35ml) 0.083% Nebu (Albuterol sulfate) .... Four  times daily or every 6 hours as needed 5)  Nexium 40 Mg Cpdr (Esomeprazole magnesium) .... Take 1 tablet by mouth once a day 6)  Advair Diskus 500-50 Mcg/dose Misc (Fluticasone-salmeterol) .... One puff twice daily 7)  Alprazolam 0.5 Mg Tabs (Alprazolam) .... Take one tab by mouth every 8 hours as needed 8)  Vitamin C 500 Mg Tabs (Ascorbic acid) .Kaitlin Henderson.. 1 by mouth daily 9)  Fish Oil 1200 Mg Caps (Omega-3 fatty acids) .Kaitlin Henderson.. 1 two times a day 10)  Vitamin E Complex 400 Unit Caps (Vitamin e) .... 2 by mouth daily 11)  Centrum Silver Tabs (Multiple vitamins-minerals) .Kaitlin Henderson.. 1 by mouth daily 12)  Benzonatate 100 Mg Caps (Benzonatate) .... 2 by mouth every 8 hours as needed 13)  Prednisone 10 Mg Tabs (Prednisone) .Kaitlin Henderson.. 1 once daily 14)  Magic Mouth Wash  .Kaitlin KitchenMarland KitchenMarland Henderson 15ml swish gargle expectorate three times daily as needed 15)  Microgaurd Powder  .... As directed 16)  Metformin Hcl 500 Mg Xr24h-tab (Metformin hcl) .Kaitlin Henderson.. 1 by mouth two times a day 17)  Glimepiride 2 Mg Tabs (Glimepiride) .... 1/2 by mouth  every morning 18)  Onetouch Ultra Test Strp (Glucose blood) .... Check blood sugar two times a day  dx: 250.00 19)  Onetouch Lancets Misc (Lancets) .... Use two times a day  dx: 250.00 20)  Ventolin Hfa 108 (90 Base) Mcg/act Aers (Albuterol sulfate) .... 2 puffs every 4-6 hours as needed 21)  Senokot S 8.6-50 Mg Tabs (Sennosides-docusate sodium) .Kaitlin Henderson.. 1-2 by mouth at bedtime as needed for constipation 22)  Pravastatin Sodium 40 Mg Tabs (Pravastatin sodium) .Kaitlin KitchenMarland KitchenMarland Henderson 1  tab by mouth once daily 23)  Spiriva Handihaler 18 Mcg Caps (Tiotropium bromide monohydrate) .... Once daily 24)  Advil 200 Mg Tabs (Ibuprofen) .... 2 every 6 hours as needed  Other Orders: Est. Patient Level III (04540) Flu Vaccine 72yrs + MEDICARE PATIENTS (J8119) Administration Flu vaccine - MCR (J4782)  Patient Instructions: 1)  No change in medications 2)  Return in      2    months 3)  flu vaccine today Prescriptions: VENTOLIN HFA 108 (90  BASE) MCG/ACT AERS (ALBUTEROL SULFATE) 2 puffs every 4-6 hours as needed  #2 x 6   Entered and Authorized by:   Storm Frisk MD   Signed by:   Storm Frisk MD on 06/26/2010   Method used:   [facility]        [facility]  [address] [phone][facility] (retail)       [address]       [place], [place]  [address]       Ph: [phone] or [phone]       Fax: [phone]   RxID:   [id number]     Prevention & Chronic Care Immunizations   Influenza vaccine: Fluvax 3+  (06/26/2010)    Tetanus booster: Not documented    Pneumococcal vaccine: Pneumovax  (06/14/2008)    H. zoster vaccine: Not documented  Colorectal Screening   Hemoccult: Not documented    Colonoscopy: Not documented  Other Screening   Pap smear: Normal  (08/05/2007)    Mammogram: Normal Left  (07/14/2007)    DXA bone density scan: Not documented   Smoking status: quit  (03/14/2010)  Diabetes Mellitus   HgbA1C: 14.8  (03/13/2010)    Eye exam: Not documented    Foot exam: yes  (03/14/2010)   High risk foot: Not documented   Foot care education: Not documented    Urine microalbumin/creatinine ratio: Not documented  Lipids   Total Cholesterol: Not documented   LDL: Not documented   LDL Direct: Not documented   HDL: Not documented   Triglycerides: Not documented    SGOT (AST): 24  (09/06/2008)   SGPT (ALT): 23  (09/06/2008)   Alkaline phosphatase: 64  (09/06/2008)   Total bilirubin: 0.7  (09/06/2008)  Self-Management Support :    Diabetes self-management support: Not documented    Lipid self-management support: Not documented    Nursing Instructions: Give Flu vaccine today   Flu Vaccine Consent Questions     Do you have a history of severe allergic reactions to this vaccine? no    Any prior history of allergic reactions to egg and/or gelatin? no    Do you have a sensitivity to the preservative Thimersol? no    Do you have a past history of  Guillan-Barre Syndrome? no    Do you currently have an acute febrile illness? no    Have you ever had a severe reaction to latex? no    Vaccine information given and explained to patient? yes    Are you currently pregnant? no    Lot Number:[id number]   Exp Date:03/02/2011   Site Given  Left Deltoid IMflu1 Gweneth Dimitri RN  June 26, 2010 12:11 PM

## 2010-10-04 NOTE — Progress Notes (Signed)
Summary: nutrition referral  ---- Converted from flag ---- ---- 03/21/2010 11:04 AM, Dagoberto Reef wrote: Dr Felicity Coyer, I spoke with hospice they think pt would benefit more if she went to [facility] Diabetic/Nutrition, could you change order.  Thanks  ---- 03/16/2010 8:45 AM, Shelbie Proctor wrote:   ---- 03/14/2010 7:07 PM, Newt Lukes MD wrote: The following orders have been entered for this patient and placed on Admin Hold:  Type:     Referral       Code:   Hospice Description:   Hospice Referral Order Date:   03/14/2010   Authorized By:   Newt Lukes MD Order #:   [id number] Clinical Notes:   already in home hospice - ask for RN to provide DM education for new dx DM to include diet/nutrition education - if unable to arrange thru hospice, refer for nutrition education - thanks  Type:     Referral       Code:   Ophthalmology Description:   Ophthalmology Referral Order Date:   03/14/2010   Authorized By:   Newt Lukes MD Order #:   [id number] Clinical Notes:   Reason:DM eye eval r/o retinopathy - pt requests dr. Emily Filbert ------------------------------  Phone Note Outgoing Call   Summary of Call: see above flag note - - yes, order placed for nutrition ed for DM teaching Initial call taken by: Newt Lukes MD,  March 21, 2010 12:08 PM

## 2010-10-04 NOTE — Progress Notes (Signed)
Summary: lancets  ---- Converted from flag ---- ---- 03/21/2010 2:23 PM, Verdell Face wrote: Valentina Gu;  Mikisha Roseland (26-Nov-2040) [phone] called and said [facility] [address] and they have not rec'd prescription for lancets yet, she said she spoke w/you this am and you were going to [facility]?  Thx, Elnita Maxwell ------------------------------       Additional Follow-up for Phone Call Additional follow up Details #2::    [facility]/pharmacist. She stated did recieved strips & lancets rx on 03/14/10. I let her know that pt stated lancets would'nt fit her. she req taht pt bring lancets & monitor back tp pharm to have them check, not sure if wrong lancet was given. Notified pt to take monitor [facility] Follow-up by: Orlan Leavens,  March 21, 2010 2:38 PM

## 2010-10-04 NOTE — Miscellaneous (Signed)
Summary: Medication Order/Hospice @ [facility]  Medication Order/Hospice @ [place]   Imported By: Sherian Rein 04/02/2010 08:29:12  _____________________________________________________________________  External Attachment:    Type:   Image     Comment:   External Document

## 2010-10-04 NOTE — Miscellaneous (Signed)
Summary: Treatment Plan/Hospice @ [facility]  Treatment Plan/Hospice @ [place]   Imported By: Sherian Rein 06/25/2010 09:13:49  _____________________________________________________________________  External Attachment:    Type:   Image     Comment:   External Document

## 2010-10-04 NOTE — Miscellaneous (Signed)
Summary: Medication orders/Hospice @ [facility]  Medication orders/Hospice @ [place]   Imported By: Sherian Rein 07/05/2010 07:33:01  _____________________________________________________________________  External Attachment:    Type:   Image     Comment:   External Document

## 2010-10-04 NOTE — Miscellaneous (Signed)
Summary: Physician's Interim Order / Hospice  Physician's Interim Order / Hospice   Imported By: Lennie Odor 06/05/2010 15:35:38  _____________________________________________________________________  External Attachment:    Type:   Image     Comment:   External Document

## 2010-10-04 NOTE — Miscellaneous (Signed)
Summary: Plan of Care & Treatment/Hospice @ [facility] of Care & Treatment/Hospice @ [place]   Imported By: Sherian Rein 10/19/2009 09:20:14  _____________________________________________________________________  External Attachment:    Type:   Image     Comment:   External Document

## 2010-10-04 NOTE — Progress Notes (Signed)
Summary: REFILL  Phone Note From Other Clinic Call back at [phone]   Caller: [facility] Call For: WRIGHT Summary of Call: NEED REFILL FOR NYSTATIN FOR [facility] AND MOUTH ULCERS [facility] [address] Initial call taken by: Rickard Patience,  September 04, 2009 2:31 PM  Follow-up for Phone Call        rx printed and given to MW-doc of the day- to sign.  Carron Curie CMA  September 04, 2009 3:01 PM  Rx signed by MW and faxed to pharmacy. I called and informed Victorino Dike with Hospice rx faxed to pharm. Zackery Barefoot CMA  September 04, 2009 4:17 PM     Prescriptions: MAGIC MOUTH [facility] swish gargle expectorate three times daily  #150 ML x 0   Entered by:   Carron Curie CMA   Authorized by:   Nyoka Cowden MD   Signed by:   Carron Curie CMA on 09/04/2009   Method used:   Printed then faxed to ...       [facility]  [address] [phone]* (retail)       [address]       [place], [place]  [address]       Ph: [phone] or [phone]       Fax: [phone]   RxID:   [id number]

## 2010-10-04 NOTE — Progress Notes (Signed)
Summary: Rx refill req  Phone Note Refill Request Message from:  Patient on May 10, 2010 10:26 AM  Refills Requested: Medication #1:  METFORMIN HCL 500 MG XR24H-TAB 1 by mouth two times a day   Dosage confirmed as above?Dosage Confirmed   Supply Requested: 6 months  Method Requested: Electronic Initial call taken by: Margaret Pyle, CMA,  May 10, 2010 10:27 AM    Prescriptions: METFORMIN HCL 500 MG XR24H-TAB (METFORMIN HCL) 1 by mouth two times a day  #60 x 5   Entered by:   Margaret Pyle, CMA   Authorized by:   Newt Lukes MD   Signed by:   Margaret Pyle, CMA on 05/10/2010   Method used:   [facility]ronically to        [facility]  [address] [phone]* (retail)       [address]       [place], [place]  [address]       Ph: [phone] or [phone]       Fax: [phone]   RxID:   [id number]

## 2010-10-04 NOTE — Assessment & Plan Note (Signed)
Summary: 3 mo rov /nws  #   Vital Signs:  Patient profile:   70 year old female Height:      62 inches (157.48 cm) O2 Sat:      90 % on 2 L/min Temp:     98.1 degrees F (36.72 degrees C) oral Pulse rate:   85 / minute BP sitting:   112 / 72  (left arm) Cuff size:   regular  Vitals Entered By: Orlan Leavens RMA (September 25, 2010 1:15 PM)  O2 Flow:  2 L/min CC: 3 month follow-up Is Patient Diabetic? Yes Did you bring your meter with you today? Yes Pain Assessment Patient in pain? no        Primary Care Provider:  Newt Lukes MD  CC:  3 month follow-up.  History of Present Illness: here for f/u  new onset DM -  dx 03/13/10 by routine pulm labs started on metformin + OSA 03/2010 reports compliance with ongoing medical treatment and no changes in medication dose or frequency. denies adverse side effects related to current therapy. no hypoglycemic events or symptoms - checks sugars 2x/day - home cbg log reviewed - range 101-170s, lowest in AM   COPD - advanced dz, freq exac - follows with pulm and enrolled with hospice for same since 06/2009 - chronic pred + cont O2 use - daily DOE but no cough or flares at this time - reports compliance with ongoing medical treatment and no changes in medication dose or frequency. denies adverse side effects related to current therapy.   breast cancer - >19yr out - dx 1998 s/p chemo and xrt - s/p r mastect for same -   anxiety - largely realetd to trouble breathing and fear of being alone - "i have no one" since spouse expired and children are "distantly involved" per her report - uses BZ as needed - increase depression symptoms and sadness  Clinical Review Panels:  Lipid Management   Cholesterol:  171 (06/26/2010)   LDL (bad choesterol):  87 (06/26/2010)   HDL (good cholesterol):  52.00 (06/26/2010)  Diabetes Management   HgBA1C:  6.5 (06/26/2010)   Creatinine:  0.8 (03/13/2010)   Last Foot Exam:  yes (06/26/2010)   Last Flu  Vaccine:  Fluvax 3+ (06/26/2010)   Last Pneumovax:  Pneumovax (06/14/2008)  CBC   WBC:  8.5 (03/13/2010)   RBC:  4.67 (03/13/2010)   Hgb:  16.3 (03/13/2010)   Hct:  46.5 (03/13/2010)   Platelets:  216.0 (03/13/2010)   MCV  99.7 (03/13/2010)   MCHC  34.9 (03/13/2010)   RDW  13.3 (03/13/2010)   PMN:  81.2 (03/13/2010)   Lymphs:  14.6 (03/13/2010)   Monos:  3.7 (03/13/2010)   Eosinophils:  0.4 (03/13/2010)   Basophil:  0.1 (03/13/2010)  Complete Metabolic Panel   Glucose:  542 (03/13/2010)   Sodium:  137 (03/13/2010)   Potassium:  5.0 (03/13/2010)   Chloride:  97 (03/13/2010)   CO2:  30 (03/13/2010)   BUN:  9 (03/13/2010)   Creatinine:  0.8 (03/13/2010)   Albumin:  3.9 (09/06/2008)   Total Protein:  7.2 (09/06/2008)   Calcium:  8.8 (03/13/2010)   Total Bili:  0.7 (09/06/2008)   Alk Phos:  64 (09/06/2008)   SGPT (ALT):  23 (09/06/2008)   SGOT (AST):  24 (09/06/2008)   Current Medications (verified): 1)  Bayer Low Strength 81 Mg  Tbec (Aspirin) .... Take 1 Tablet By Mouth Once A Day 2)  Lasix 40 Mg  Tabs (Furosemide) .... Take 1 Tablet By Mouth Two Times A Day 3)  Klor-Con 20 Meq  Pack (Potassium Chloride) .... Take 1 Tablets By Mouth Twice A Day 4)  Albuterol Sulfate (2.5 Mg/28ml) 0.083%  Nebu (Albuterol Sulfate) .... Four Times Daily or Every 6 Hours As Needed 5)  Nexium 40 Mg  Cpdr (Esomeprazole Magnesium) .... Take 1 Tablet By Mouth Once A Day 6)  Advair Diskus 500-50 Mcg/dose  Misc (Fluticasone-Salmeterol) .... One Puff Twice Daily 7)  Alprazolam 0.5 Mg  Tabs (Alprazolam) .... Take One Tab By Mouth Every 8 Hours As Needed 8)  Vitamin C 500 Mg Tabs (Ascorbic Acid) .[name].. 1 By Mouth Daily 9)  Fish Oil 1200 Mg Caps (Omega-3 Fatty Acids) .[name].. 1 Two Times A Day 10)  Vitamin E Complex 400 Unit Caps (Vitamin E) .... 2 By Mouth Daily 11)  Centrum Silver  Tabs (Multiple Vitamins-Minerals) .[name].. 1 By Mouth Daily 12)  Benzonatate 100 Mg Caps (Benzonatate) .... 2 By Mouth Every 8 Hours  As Needed 13)  Prednisone 10 Mg  Tabs (Prednisone) .[name].. 1 Once Daily 14)  Magic Mouth Wash .Marland KitchenMarland KitchenMarland Kitchen 15ml Swish Gargle Expectorate Three Times Daily As Needed 15)  Microgaurd Powder .... As Directed 16)  Metformin Hcl 500 Mg Xr24h-Tab (Metformin Hcl) .[name].. 1 By Mouth Two Times A Day 17)  Glimepiride 2 Mg Tabs (Glimepiride) .... 1/2 By Mouth  Every Morning If Cbg >150 18)  Onetouch Ultra Test  Strp (Glucose Blood) .... Check Blood Sugar Two Times A Day  Dx: 250.00 19)  Onetouch Lancets  Misc (Lancets) .... Use Two Times A Day  Dx: 250.00 20)  Ventolin Hfa 108 (90 Base) Mcg/act Aers (Albuterol Sulfate) .... 2 Puffs Every 4-6 Hours As Needed 21)  Senokot S 8.6-50 Mg Tabs (Sennosides-Docusate Sodium) .[name].. 1-2 By Mouth At Bedtime As Needed For Constipation 22)  Pravastatin Sodium 40 Mg Tabs (Pravastatin Sodium) .[name].. 1 Tab By Mouth Once Daily 23)  Spiriva Handihaler 18 Mcg Caps (Tiotropium Bromide Monohydrate) .... Once Daily 24)  Advil 200 Mg Tabs (Ibuprofen) .... 2 Every 6 Hours As Needed 25)  Oxygen .[name].. 3l Continuous  Allergies (verified): 1)  ! Sulfa 2)  ! Codeine  Past History:  Past Medical History: C O P D - chronic O2 + steroid Hyperlipidemia gastroesophageal reflux anxiety/depression obesity right breast cancer in 1998, s/p chemo + XRT, and right mastectomy hypothyroidism. Diabetes, Type 2     -dx 03/2010: Hgb A1C 14.8, BS 540   MD roster: pulm - wright   Review of Systems       The patient complains of dyspnea on exertion.  The patient denies anorexia, fever, peripheral edema, headaches, hemoptysis, and abdominal pain.    Physical Exam  General:  overweight-appearing.  alert, well-developed, well-nourished, and cooperative to examination.   wearing [place] O2 Lungs:  normal respiratory effort, no intercostal retractions or use of accessory muscles; diminished breath sounds bilaterally -but no crackles and no wheezes.    Heart:  normal rate, regular rhythm, no murmur, and no rub.  BLE without edema.  Psych:  Oriented X3, memory intact for recent and remote, normally interactive, good eye contact, not anxious appearing, mod depressed appearing, and not agitated.   occ tearful during hx and exam   Impression & Recommendations:  Problem # 1:  DEPRESSION (ICD-311)  complicated by adv med dz (pulm ,cards) and hospice status agreeable to starting daily ssri in addition to as needed BZ use discussed poss risk.benefits -  pt understands and agrees - will call if probs before next f/u sched Her updated medication list for this problem includes:    Alprazolam 0.5 Mg Tabs (Alprazolam) .[name]... Take one tab by mouth every 8 hours as needed    Sertraline Hcl 25 Mg Tabs (Sertraline hcl) .[name]... 1 by mouth once daily  Orders: Prescription Created Electronically [phone])  Problem # 2:  DIABETES, TYPE 2 (ICD-250.00)  Her updated medication list for this problem includes:    Bayer Low Strength 81 Mg Tbec (Aspirin) .[name]... Take 1 tablet by mouth once a day    Metformin Hcl 500 Mg Xr24h-tab (Metformin hcl) .[name]... 1 by mouth two times a day    Glimepiride 2 Mg Tabs (Glimepiride) .[name]... 1/2 by mouth  every morning if cbg >150  new dx 03/13/10, uncontrolled  - likely related to steroid use (which is unlikely to change given pulm dz) initiated oral meds as pt wishes to avoid insulin  or other "shots"-  as Cr normal, use metform + SU labs reviewed no HONK or DKA  on recent labs and clinically nontoxic - education initiated - glucometer provided upcoming formal DM teaching - through hospice if availbale, otherwise nutrition educators s/p eye check with dr. Emily Filbert - reports no dm damage  Time spent with patient 25 minutes, more than 50% of this time was spent counseling patient on diabetes - dx, meds and diet and review of home cbg - also depression  Labs Reviewed: Creat: 0.8 (03/13/2010)    Reviewed HgBA1c results: 6.5 (06/26/2010)  14.8 (03/13/2010)  Orders: TLB-A1C / Hgb A1C  (Glycohemoglobin) (83036-A1C)  Problem # 3:  CHRONIC RESPIRATORY FAILURE (GUY-403.47)  see discussion with COPD as below chronic resp failure with Golds stage IV COPD cont hospice care cont inhaled meds as prescribed  Problem # 4:  C O P D (ICD-496)  Her updated medication list for this problem includes:    Albuterol Sulfate (2.5 Mg/36ml) 0.083% Nebu (Albuterol sulfate) .[name]... Four times daily or every 6 hours as needed    Advair Diskus 500-50 Mcg/dose Misc (Fluticasone-salmeterol) ..... One puff twice daily    Ventolin Hfa 108 (90 Base) Mcg/act Aers (Albuterol sulfate) .[name]... 2 puffs every 4-6 hours as needed    Spiriva Handihaler 18 Mcg Caps (Tiotropium bromide monohydrate) ..... Once daily  End stage copd golds stage iv plan cont hospice care no change in meds  flu vaccine  Pulmonary Functions Reviewed: FEV1: 1.01 (12/06/2008)   FEV 25-75: 0.31 (12/06/2008)   O2 sat: 90 (09/25/2010)     Vaccines Reviewed: Pneumovax: Pneumovax (06/14/2008)   Flu Vax: Fluvax 3+ (06/26/2010)  Problem # 5:  HYPERLIPIDEMIA (ICD-272.4)  Her updated medication list for this problem includes:    Pravastatin Sodium 40 Mg Tabs (Pravastatin sodium) .[name]... 1 tab by mouth once daily  Labs Reviewed: SGOT: 24 (09/06/2008)   SGPT: 23 (09/06/2008)   HDL:52.00 (06/26/2010)  LDL:87 (06/26/2010)  Chol:171 (06/26/2010)  Trig:158.0 (06/26/2010)  Complete Medication List: 1)  Bayer Low Strength 81 Mg Tbec (Aspirin) .... Take 1 tablet by mouth once a day 2)  Lasix 40 Mg Tabs (Furosemide) .... Take 1 tablet by mouth two times a day 3)  Klor-con 20 Meq Pack (Potassium chloride) .... Take 1 tablets by mouth twice a day 4)  Albuterol Sulfate (2.5 Mg/38ml) 0.083% Nebu (Albuterol sulfate) .... Four times daily or every 6 hours as needed 5)  Nexium 40 Mg Cpdr (Esomeprazole magnesium) .... Take 1 tablet by mouth once a day 6)  Advair Diskus 500-50 Mcg/dose Misc (Fluticasone-salmeterol) .... One puff twice daily 7)   Alprazolam 0.5 Mg Tabs (Alprazolam) .... Take one tab by mouth every 8 hours as needed 8)  Vitamin C 500 Mg Tabs (Ascorbic acid) .[name].. 1 by mouth daily 9)  Fish Oil 1200 Mg Caps (Omega-3 fatty acids) .[name].. 1 two times a day 10)  Vitamin E Complex 400 Unit Caps (Vitamin e) .... 2 by mouth daily 11)  Centrum Silver Tabs (Multiple vitamins-minerals) .[name].. 1 by mouth daily 12)  Benzonatate 100 Mg Caps (Benzonatate) .... 2 by mouth every 8 hours as needed 13)  Prednisone 10 Mg Tabs (Prednisone) .[name].. 1 once daily 14)  Magic Mouth Wash  .Marland KitchenMarland KitchenMarland Kitchen 15ml swish gargle expectorate three times daily as needed 15)  Microgaurd Powder  .... As directed 16)  Metformin Hcl 500 Mg Xr24h-tab (Metformin hcl) .[name].. 1 by mouth two times a day 17)  Glimepiride 2 Mg Tabs (Glimepiride) .... 1/2 by mouth  every morning if cbg >150 18)  Onetouch Ultra Test Strp (Glucose blood) .... Check blood sugar two times a day  dx: 250.00 19)  Onetouch Lancets Misc (Lancets) .... Use two times a day  dx: 250.00 20)  Ventolin Hfa 108 (90 Base) Mcg/act Aers (Albuterol sulfate) .... 2 puffs every 4-6 hours as needed 21)  Senokot S 8.6-50 Mg Tabs (Sennosides-docusate sodium) .[name].. 1-2 by mouth at bedtime as needed for constipation 22)  Pravastatin Sodium 40 Mg Tabs (Pravastatin sodium) .[name].. 1 tab by mouth once daily 23)  Spiriva Handihaler 18 Mcg Caps (Tiotropium bromide monohydrate) .... Once daily 24)  Advil 200 Mg Tabs (Ibuprofen) .... 2 every 6 hours as needed 25)  Oxygen  .[name].. 3l continuous 26)  Sertraline Hcl 25 Mg Tabs (Sertraline hcl) .[name].. 1 by mouth once daily  Other Orders: TLB-Potassium (K+) (84132-K) TLB-Creatinine, Blood (82565-CREA) TLB-Hepatic/Liver Function Pnl (80076-HEPATIC)  Patient Instructions: 1)  it was good to see you today. 2)  test(s) ordered today - your results will be posted on the phone tree for review in 48-72 hours from the time of test completion; call [phone] and enter your 9 digit MRN (listed above on  this page, just below your name); if any changes need to be made or there are abnormal results, you will be contacted directly. 3)  start low dose setraline for depression and anxiety symptoms - take one everyday as discussed - your prescription and nexium refill have been electronically submitted to your pharmacy. Please take as directed. Contact our office if you believe you're having problems with the medication(s).  4)  Please schedule a follow-up appointment in 3 months, call sooner if problems.  Prescriptions: NEXIUM 40 MG  CPDR (ESOMEPRAZOLE MAGNESIUM) Take 1 tablet by mouth once a day  #30 x 6   Entered and Authorized by:   Newt Lukes MD   Signed by:   Newt Lukes MD on 09/25/2010   Method used:   [facility]        [facility] [address] [phone]* (retail)       [address]       [place], [place]  [address]       Ph: [phone] or [phone]       Fax: [phone]   RxID:   [id number] SERTRALINE HCL 25 MG TABS (SERTRALINE HCL) 1 by mouth once daily  #30 x 3   Entered and Authorized by:   Vikki Ports  Edsel Petrin MD   Signed by:   Newt Lukes MD on 09/25/2010   Method used:   Electronically to        [facility] [address] [phone]* (retail)       [address]       [place], [place]  [address]       Ph: [phone] or [phone]       Fax: [phone]   RxID:   [id number]    Orders Added: 1)  Est. Patient Level IV [42595] 2)  Prescription Created Electronically [G8553] 3)  TLB-A1C / Hgb A1C (Glycohemoglobin) [83036-A1C] 4)  TLB-Potassium (K+) [84132-K] 5)  TLB-Creatinine, Blood [82565-CREA] 6)  TLB-Hepatic/Liver Function Pnl [80076-HEPATIC]

## 2010-10-04 NOTE — Miscellaneous (Signed)
Summary: Plan of Treatment/Hospice @ [facility] of Treatment/Hospice @    Imported By: Sherian Rein 04/24/2010 10:09:41  _____________________________________________________________________  External Attachment:    Type:   Image     Comment:   External Document

## 2010-10-04 NOTE — Miscellaneous (Signed)
Summary: Plan/[place]  Plan/[place]   Imported By: Lester Westmoreland 02/28/2010 12:00:09  _____________________________________________________________________  External Attachment:    Type:   Image     Comment:   External Document

## 2010-10-04 NOTE — Progress Notes (Signed)
Summary: refill request  Phone Note Call from Patient   Caller: Patient Call For: Zariana Strub Summary of Call: pt needs refill of ventolin HFA asap [facility] [address] pt cell [phone] Initial call taken by: Tivis Ringer, CNA,  March 27, 2010 3:00 PM  Follow-up for Phone Call        called and spoke with pt ---it looks like the ventolin was taken off the pts med list but she stated that she has never stopped this med.  please advise if ok to refills--pt needs these refills asap---pt also wanted PW to know that she has lost lots of weight and her blood sugar this am was 101.  she wanted PW to know that she has been working very hard to do what he has told her to do. Randell Loop [facility]  March 27, 2010 4:08 PM   Additional Follow-up for Phone Call Additional follow up Details #1::        This is ok to refill Additional Follow-up by: Storm Frisk MD,  March 28, 2010 9:05 AM    Additional Follow-up for Phone Call Additional follow up Details #2::    rx has been sent to pts pharmacy Randell Loop CMA  March 28, 2010 9:08 AM   New/Updated Medications: VENTOLIN HFA 108 (90 BASE) MCG/ACT AERS (ALBUTEROL SULFATE) 2 puffs every 4-6 hours as needed Prescriptions: VENTOLIN HFA 108 (90 BASE) MCG/ACT AERS (ALBUTEROL SULFATE) 2 puffs every 4-6 hours as needed  #1 x 5   Entered by:   Randell Loop CMA   Authorized by:   Storm Frisk MD   Signed by:   Randell Loop CMA on 03/28/2010   Method used:   [facility]ically to        [facility]  [address] [phone]* (retail)       [address]       [place], [place]  [address]       Ph: [phone] or [phone]       Fax: [phone]   RxID:   [id number]

## 2010-10-04 NOTE — Assessment & Plan Note (Signed)
Summary: Pulmonary OV   Primary Provider/Referring Provider:  Dr. Sanda Linger  CC:    3 mos followup, sob the same, cough some yesterday, and none today.  History of Present Illness: 70 yo WF with advanced COPD Golds Stage III  oxygen dependent here for pulm f/u.     December 13, 2009 3:25 PM Here for copd f/u .  The pt has no excess mucous.  The pt now has an acapella valve per Hospice. Pt denies any significant sore throat, nasal congestion or excess secretions, fever, chills, sweats, unintended weight loss, pleurtic or exertional chest pain, orthopnea PND, or leg swelling Pt denies any increase in rescue therapy over baseline, denies waking up needing it or having any early am or nocturnal exacerbations of coughing/wheezing/or dyspnea.   March 13, 2010 2:27 PM f/u from 4/11. The pt  notes more excess urination and urine incontinence.   The pt was rx for a yeast infxn x one month between the thighs.   Hospice rx diflucan and was better.  Pt with no prior dx of DM.  Pt is on prednisone 10mg /d.   The pt had hx of high BS when on high dose steroids with copd exac in the past.  The pt.  is still in hospice.   August 23, 2009 8:55 AM The pt now has thrush and is on diflucan.   The dyspnea is the same and there is less cough.  There is less mucous production. Hospice helps with home care. The pt remains on oxygen.     Preventive Screening-Counseling & Management  Alcohol-Tobacco     Smoking Status: quit     Year Quit: 2008  Current Medications (verified): 1)  Bayer Low Strength 81 Mg  Tbec (Aspirin) .... Take 1 Tablet By Mouth Once A Day 2)  Lasix 40 Mg  Tabs (Furosemide) .... Take 1 Tablet By Mouth Two Times A Day 3)  Klor-Con 20 Meq  Pack (Potassium Chloride) .... Take 1 Tablets By Mouth Twice A Day 4)  Albuterol Sulfate (2.5 Mg/62ml) 0.083%  Nebu (Albuterol Sulfate) .... Four Times Daily or Every 6 Hours As Needed 5)  Nexium 40 Mg  Cpdr (Esomeprazole Magnesium) .... Take 1 Tablet  By Mouth Once A Day 6)  Advair Diskus 500-50 Mcg/dose  Misc (Fluticasone-Salmeterol) .... One Puff Twice Daily 7)  Alprazolam 0.5 Mg  Tabs (Alprazolam) .... Take One Tab By Mouth Every 8 Hours As Needed 8)  Vitamin C 500 Mg Tabs (Ascorbic Acid) .[name].. 1 By Mouth Daily 9)  Fish Oil 1200 Mg Caps (Omega-3 Fatty Acids) .[name].. 1 Two Times A Day 10)  Vitamin E Complex 400 Unit Caps (Vitamin E) .... 2 By Mouth Daily 11)  Centrum Silver  Tabs (Multiple Vitamins-Minerals) .[name].. 1 By Mouth Daily 12)  Benzonatate 100 Mg Caps (Benzonatate) .... 2 By Mouth Every 8 Hours As Needed 13)  Prednisone 10 Mg  Tabs (Prednisone) .[name].. 1 Once Daily 14)  Magic Mouth Wash .Marland KitchenMarland KitchenMarland Kitchen 15ml Swish Gargle Expectorate Three Times Daily As Needed 15)  Microgaurd Powder .... As Directed  Allergies (verified): 1)  ! Sulfa 2)  ! Codeine  Past History:  Past medical, surgical, family and social histories (including risk factors) reviewed, and no changes noted (except as noted below).  Past Medical History: C O P D Hyperlipidemia gastroesophageal reflux, anxiety, obesity, breast cancer in 1998, status post chemotherapy and radiation, and right mastectomy, hypothyroidism. Surgical history.  Status post hysterectomy, cholecystectomy, hernia repair x 2.  Status  post appendectomy, and hemorrhoidectomy Diabetes, Type 2    -7/11: Hgb A1C 14.8, BS 540  Past Surgical History: Reviewed history from 11/23/2008 and no changes required. Appendectomy Cholecystectomy Hysterectomy Mastectomy Tonsillectomy  Past Pulmonary History:  Pulmonary History: COPD Golds Stage III     12/06/2008:  PFT's (COPD) Pulmonary Function Test  Date: 12/06/2008 Height (in.): 63 Gender: Female  Pre-Spirometry  FVC     Value: 1.95 L/min   Pred: 2.76 L/min     % Pred: 71 % FEV1     Value: 1.01 L     Pred: 1.97 L     % Pred: 51 % FEV1/FVC   Value: 52 %     Pred: 72 %     FEF 25-75   Value: 0.31 L/min   Pred: 2.29 L/min     % Pred: 13  %  Post-Spirometry  FVC     Value: 2.08 L/min   Pred: 2.76 L/min     % Pred: 75 % FEV1     Value: 1.16 L     Pred: 1.97 L     % Pred: 59 % FEV1/FVC   Value: 56 %     Pred: 72 %     FEF 25-75   Value: 0.46 L/min   Pred: 2.29 L/min     % Pred: 20 %  Lung Volumes  TLC     Value: 4.93 L   % Pred: 106 % RV     Value: 2.86 L   % Pred: 156 % DLCO     Value: 12.8 %   % Pred: 53 % DLCO/VA   Value: 4.35 %   % Pred: 122 %  Evaluation:  severe obstruction with significant bronchodilator response  Polysomnogram 12/08: No evidence of sleep apnea Nocturnal desat corrected with 3L [place] oxygen  Family History: Reviewed history from 09/15/2007 and no changes required. non contributory  Social History: Reviewed history from 11/23/2008 and no changes required. Patient states former smoker.  Retired Widow/Widower Alcohol use-no Drug use-no Regular exercise-no Smoking Status:  quit  Review of Systems       The patient complains of shortness of breath with activity, shortness of breath at rest, and non-productive cough.  The patient denies productive cough, coughing up blood, chest pain, irregular heartbeats, acid heartburn, indigestion, loss of appetite, weight change, abdominal pain, difficulty swallowing, sore throat, tooth/dental problems, headaches, nasal congestion/difficulty breathing through nose, sneezing, itching, ear ache, anxiety, depression, hand/feet swelling, joint stiffness or pain, rash, change in color of mucus, and fever.    Vital Signs:  Patient profile:   70 year old female Height:      62 inches Weight:      209.13 pounds BMI:     38.39 O2 Sat:      95 % on 3 L/min Temp:     98.3 degrees F oral Pulse rate:   86 / minute BP sitting:   120 / 72  (right arm) Cuff size:   large  Vitals Entered By: Kandice Hams CMA (March 13, 2010 1:59 PM)  O2 Flow:  3 L/min CC:   3 mos followup,sob the same, cough some yesterday, none today   Physical Exam  Additional Exam:  Gen:  Pleasant, obese,  in no distress ENT: no lesions, no postnasal drip, apthous ulcer lower lip internal,  thrush is healing Neck: No JVD, no TMG, no carotid bruits Lungs: no use of accessory muscles, no dullness to percussion, distant BS, no wheeze,  no rhonchi Cardiovascular: RRR, heart sounds normal, no murmurs or gallops, no peripheral edema Musculoskeletal: No deformities, no cyanosis or clubbing     Impression & Recommendations:  Problem # 1:  DIABETES, TYPE 2 (ICD-250.00) Assessment New New onset DM suspect type 2 steroid induced.  A1C 14.9  This would account for yeast infections and polyuria. plan PCP referral ASAP 7/13 Her updated medication list for this problem includes:    Bayer Low Strength 81 Mg Tbec (Aspirin) .[name]... Take 1 tablet by mouth once a day  Problem # 2:  CHRONIC RESPIRATORY FAILURE (ICD-518.83) Assessment: Unchanged  chronic resp failure with Golds stage IV COPD plan cont hospice care cont inhaled meds as prescribed  Complete Medication List: 1)  Bayer Low Strength 81 Mg Tbec (Aspirin) .... Take 1 tablet by mouth once a day 2)  Lasix 40 Mg Tabs (Furosemide) .... Take 1 tablet by mouth two times a day 3)  Klor-con 20 Meq Pack (Potassium chloride) .... Take 1 tablets by mouth twice a day 4)  Albuterol Sulfate (2.5 Mg/61ml) 0.083% Nebu (Albuterol sulfate) .... Four times daily or every 6 hours as needed 5)  Nexium 40 Mg Cpdr (Esomeprazole magnesium) .... Take 1 tablet by mouth once a day 6)  Advair Diskus 500-50 Mcg/dose Misc (Fluticasone-salmeterol) .... One puff twice daily 7)  Alprazolam 0.5 Mg Tabs (Alprazolam) .... Take one tab by mouth every 8 hours as needed 8)  Vitamin C 500 Mg Tabs (Ascorbic acid) .[name].. 1 by mouth daily 9)  Fish Oil 1200 Mg Caps (Omega-3 fatty acids) .[name].. 1 two times a day 10)  Vitamin E Complex 400 Unit Caps (Vitamin e) .... 2 by mouth daily 11)  Centrum Silver Tabs (Multiple vitamins-minerals) .[name].. 1 by mouth daily 12)  Benzonatate  100 Mg Caps (Benzonatate) .... 2 by mouth every 8 hours as needed 13)  Prednisone 10 Mg Tabs (Prednisone) .[name].. 1 once daily 14)  Magic Mouth Wash  .Marland KitchenMarland KitchenMarland Kitchen 15ml swish gargle expectorate three times daily as needed 15)  Microgaurd Powder  .... As directed  Other Orders: Est. Patient Level IV (81191) TLB-BMP (Basic Metabolic Panel-BMET) (80048-METABOL) TLB-CBC Platelet - w/Differential (85025-CBCD) TLB-A1C / Hgb A1C (Glycohemoglobin) (83036-A1C) TLB-Udip w/ Micro (81001-URINE)  Patient Instructions: 1)  Labs today 2)  No change in medications 3)  We will make an appt with Dr Yetta Barre for evaluation of diabetes, urinary incontinence 4)  Return 2 months

## 2010-10-04 NOTE — Progress Notes (Signed)
Summary: returning call  Phone Note Call from Patient Call back at Home Phone [phone] Call back at [phone] cell   Caller: Patient Call For: wright Reason for Call: Talk to Nurse Summary of Call: pt said our number on her caller ID several times after leaving here yesterday.  Who called and what was needed? Initial call taken by: Eugene Gavia,  October 31, 2009 8:23 AM  Follow-up for Phone Call        I don't see any documentation in pt's chart where we tried to contact pt.  Will forward message to Crystal to see if she was trying to contact pt or if PW was.   Aundra Millet Reynolds LPN  November 01, 2950 9:04 AM   Additional Follow-up for Phone Call Additional follow up Details #1::        called, spoke with.  Informed her that she did not get alprazolam rx yesterday while in office.  Per pt ok to mail it to her at this address: [address] [place], [place] [address] Rx placed in mail.  Pt aware.   Additional Follow-up by: Gweneth Dimitri RN,  October 31, 2009 9:50 AM

## 2010-10-04 NOTE — Progress Notes (Signed)
Summary: office notes/apc  Phone Note Call from Patient Call back at [phone]   Caller: Amy w/ [facility] Call For: wright Summary of Call: need lab and office notes from last visit w/wright faxed [phone] Attn: Amy Initial call taken by: Eugene Gavia,  March 15, 2010 1:20 PM  Follow-up for Phone Call        Faxed records.//Juanita Follow-up by: Darletta Moll,  March 16, 2010 8:11 AM

## 2010-10-04 NOTE — Progress Notes (Signed)
Summary: D/C Med?  Phone Note Call from Patient Call back at [facility] Phone [phone]   Caller: Patient Summary of Call: Pt called stating that CBG at night are 88-108 and she would like to know if she can stop taking Glimeperide 2mg  1/2 tab in the morning. Initial call taken by: Margaret Pyle, CMA,  July 04, 2010 11:12 AM  Follow-up for Phone Call        ok to stop med for now - should cont to monitor cbgs as ongoing now - if cbg >150 off med, would resume glimepiride - thanks Follow-up by: Newt Lukes MD,  July 04, 2010 11:15 AM  Additional Follow-up for Phone Call Additional follow up Details #1::        Pt advised of above Additional Follow-up by: Margaret Pyle, CMA,  July 04, 2010 11:18 AM    New/Updated Medications: GLIMEPIRIDE 2 MG TABS (GLIMEPIRIDE) 1/2 by mouth  every morning if cbg >150

## 2010-10-04 NOTE — Miscellaneous (Signed)
Summary: Plan of Treatment/Hospice @ [facility] of Treatment/Hospice @ [facility]   Imported By: Sherian Rein 01/04/2010 11:44:11  _____________________________________________________________________  External Attachment:    Type:   Image     Comment:   External Document

## 2010-10-04 NOTE — Assessment & Plan Note (Signed)
Summary: jonespt/elevated cbg -OV per dr Dinah Beers   Vital Signs:  Patient profile:   70 year old female Height:      62 inches (157.48 cm) Weight:      209.4 pounds (95.18 kg) O2 Sat:      92 % on 1.5 L/min Temp:     98.8 degrees F (37.11 degrees C) oral Pulse rate:   87 / minute BP sitting:   130 / 82  (left arm) Cuff size:   large  Vitals Entered By: Orlan Leavens (March 14, 2010 1:59 PM)  O2 Flow:  1.5 L/min CC: Elevated blood sugar Is Patient Diabetic? Yes Did you bring your meter with you today? No Pain Assessment Patient in pain? no      CBG Result 367   Primary Care Provider:  Dr. Sanda Linger  CC:  Elevated blood sugar.  History of Present Illness: pt known to our practice but new to me -  here at urgent direction of pulm (dr. Delford Field) re: abnomal labs - new DM  new onset DM -   dx by labs done yesterday no known dx same but told she had high sugar levels during 2008 hospitalization - c/o freq urination and freq thirst - also associated with blurred vision but no vision loss also +yeast infx - vaginal and skin folds, chronic onset symptoms >6 months ago risk factors include chronic pred use for lung dz - but denies recent increase in dose denies weight changes -   also reviewed other medical hx -  COPD - asvanced dz, freq exac - enrolled with hospice for same since 06/2009 - chroni pred + cont O2 use - daily DOE but no cough or flares at thsi time - reports compliance with ongoing medical treatment and no changes in medication dose or frequency. denies adverse side effects related to current therapy.   breast cancer - >63yr out - dx 1998 s/p chemo and xrt - s/p r mastect for same -   anxiety - largely realetd to trouble breathing and fear of being alone - "i have no one" since spouse expired adn children or distantly involved per her report - uses BZ as needed -   Preventive Screening-Counseling & Management  Alcohol-Tobacco     Alcohol drinks/day: 0  Smoking Status: quit     Year Quit: 2008     Pack years: 70  Caffeine-Diet-Exercise     Does Patient Exercise: no  Safety-Violence-Falls     Seat Belt Counseling: not indicated; patient wears seat belts     Helmet Counseling: not applicable     Firearms in the Home: no firearms in the home     Firearm Counseling: not applicable     Violence Counseling: not indicated; no violence risk noted     Fall Risk Counseling: not indicated; no significant falls noted  Clinical Review Panels:  Diabetes Management   HgBA1C:  14.8 (03/13/2010)   Creatinine:  0.8 (03/13/2010)   Last Foot Exam:  yes (03/14/2010)   Last Flu Vaccine:  Fluvax MCR (06/19/2009)   Last Pneumovax:  Pneumovax (06/14/2008)  CBC   WBC:  8.5 (03/13/2010)   RBC:  4.67 (03/13/2010)   Hgb:  16.3 (03/13/2010)   Hct:  46.5 (03/13/2010)   Platelets:  216.0 (03/13/2010)   MCV  99.7 (03/13/2010)   MCHC  34.9 (03/13/2010)   RDW  13.3 (03/13/2010)   PMN:  81.2 (03/13/2010)   Lymphs:  14.6 (03/13/2010)   Monos:  3.7 (03/13/2010)  Eosinophils:  0.4 (03/13/2010)   Basophil:  0.1 (03/13/2010)  Complete Metabolic Panel   Glucose:  542 (03/13/2010)   Sodium:  137 (03/13/2010)   Potassium:  5.0 (03/13/2010)   Chloride:  97 (03/13/2010)   CO2:  30 (03/13/2010)   BUN:  9 (03/13/2010)   Creatinine:  0.8 (03/13/2010)   Albumin:  3.9 (09/06/2008)   Total Protein:  7.2 (09/06/2008)   Calcium:  8.8 (03/13/2010)   Total Bili:  0.7 (09/06/2008)   Alk Phos:  64 (09/06/2008)   SGPT (ALT):  23 (09/06/2008)   SGOT (AST):  24 (09/06/2008)   Current Medications (verified): 1)  Bayer Low Strength 81 Mg  Tbec (Aspirin) .... Take 1 Tablet By Mouth Once A Day 2)  Lasix 40 Mg  Tabs (Furosemide) .... Take 1 Tablet By Mouth Two Times A Day 3)  Klor-Con 20 Meq  Pack (Potassium Chloride) .... Take 1 Tablets By Mouth Twice A Day 4)  Albuterol Sulfate (2.5 Mg/74ml) 0.083%  Nebu (Albuterol Sulfate) .... Four Times Daily or Every 6 Hours As  Needed 5)  Nexium 40 Mg  Cpdr (Esomeprazole Magnesium) .... Take 1 Tablet By Mouth Once A Day 6)  Advair Diskus 500-50 Mcg/dose  Misc (Fluticasone-Salmeterol) .... One Puff Twice Daily 7)  Alprazolam 0.5 Mg  Tabs (Alprazolam) .... Take One Tab By Mouth Every 8 Hours As Needed 8)  Vitamin C 500 Mg Tabs (Ascorbic Acid) .[name].. 1 By Mouth Daily 9)  Fish Oil 1200 Mg Caps (Omega-3 Fatty Acids) .[name].. 1 Two Times A Day 10)  Vitamin E Complex 400 Unit Caps (Vitamin E) .... 2 By Mouth Daily 11)  Centrum Silver  Tabs (Multiple Vitamins-Minerals) .[name].. 1 By Mouth Daily 12)  Benzonatate 100 Mg Caps (Benzonatate) .... 2 By Mouth Every 8 Hours As Needed 13)  Prednisone 10 Mg  Tabs (Prednisone) .[name].. 1 Once Daily 14)  Magic Mouth Wash .Marland KitchenMarland KitchenMarland Kitchen 15ml Swish Gargle Expectorate Three Times Daily As Needed 15)  Microgaurd Powder .... As Directed  Allergies (verified): 1)  ! Sulfa 2)  ! Codeine  Past History:  Past Medical History: C O P D - chronic O2 + steroid Hyperlipidemia gastroesophageal reflux anxiety obesity right breast cancer in 1998, s/p chemo + XRT, and right mastectomy hypothyroidism. Diabetes, Type 2     -7/11: Hgb A1C 14.8, BS 540  MD roster: pulm Delford Field  Past Surgical History: Appendectomy Cholecystectomy Hysterectomy  Mastectomy, right Tonsillectomy  Family History: non contributory no diabetes known  Social History: former smoker.  Retired Conservation officer, nature since 2008 - lives alone home hospice since 06/2009 related to COPD Alcohol use-no Drug use-no Regular exercise-no  Review of Systems       The patient complains of dyspnea on exertion and depression.  The patient denies anorexia, fever, weight loss, vision loss, decreased hearing, chest pain, syncope, peripheral edema, headaches, abdominal pain, melena, hematochezia, severe indigestion/heartburn, hematuria, muscle weakness, suspicious skin lesions, difficulty walking, and abnormal bleeding.         otherwise see HPI  above. I have reviewed all other systems and they were negative.   Physical Exam  General:  overweight-appearing.  alert, well-developed, well-nourished, and cooperative to examination.   wearing  O2 Head:  Normocephalic and atraumatic without obvious abnormalities. No apparent alopecia or balding. Eyes:  vision grossly intact; pupils equal, round and reactive to light.  conjunctiva and lids normal.    Ears:  R ear normal and L ear normal.   Mouth:  teeth and gums  in fair repair; mucous membranes moist, without lesions or ulcers. oropharynx clear without exudate, no erythema.  Breasts:  R breast surgically absent.   Lungs:  normal respiratory effort, no intercostal retractions or use of accessory muscles; diminished breath sounds bilaterally -but no crackles and no wheezes.    Heart:  normal rate, regular rhythm, no murmur, and no rub. BLE without edema.  Abdomen:  obese, soft, non-tender, normal bowel sounds, no distention; no masses and no appreciable hepatomegaly or splenomegaly.   Genitalia:  defer Msk:  No deformity or scoliosis noted of thoracic or lumbar spine.   Neurologic:  alert & oriented X3 and cranial nerves II-XII symetrically intact.  strength normal in all extremities, sensation intact to light touch, and gait normal. speech fluent without dysarthria or aphasia; follows commands with good comprehension.  Skin:  +candida changes along skin fold lateral chest wall Psych:  Oriented X3, memory intact for recent and remote, normally interactive, good eye contact, not anxious appearing, mild depressed appearing, and not agitated.   occ tearful during hx and exam  Diabetes Management Exam:    Foot Exam (with socks and/or shoes not present):       Sensory-Pinprick/Light touch:          Left medial foot (L-4): normal          Left dorsal foot (L-5): normal          Left lateral foot (S-1): normal          Right medial foot (L-4): normal          Right dorsal foot (L-5): normal           Right lateral foot (S-1): normal       Sensory-Monofilament:          Left foot: normal          Right foot: normal       Inspection:          Left foot: normal          Right foot: normal       Nails:          Left foot: normal          Right foot: normal   Impression & Recommendations:  Problem # 1:  DIABETES, TYPE 2 (ICD-250.00)  new dx, uncontrolled  - likely related to steroid use (which is unlikely to change given pulm dz) initiate oral meds as pt wisehes to avoid insulin  or other "shots"-  as Cr normal, use metform + SU labs reviewed no HONK or DKA  on recent labs and clinically nontoxic - education initiated - glucometer provided refer for further DM teaching - through hospice if availbale, otherwise nutrition educators refer for eye check recheck 2 weeks to review meds, home cbg log and cont education Time spent with patient 40 minutes, more than 50% of this time was spent counseling patient on diabetes - new dx, meds and diet  Her updated medication list for this problem includes:    Bayer Low Strength 81 Mg Tbec (Aspirin) .[name]... Take 1 tablet by mouth once a day    Metformin Hcl 500 Mg Xr24h-tab (Metformin hcl) .[name]... 1 by mouth two times a day    Glimepiride 2 Mg Tabs (Glimepiride) .[name]... 1 by mouth two times a day  Orders: Glucose, (CBG) (81191) Prescription Created Electronically [phone]) Hospice Referral [facility]) Ophthalmology Referral (Ophthalmology)  Labs Reviewed: Creat: 0.8 (03/13/2010)    Reviewed HgBA1c results: 14.8 (  03/13/2010)  Problem # 2:  C O P D (ICD-496)  Her updated medication list for this problem includes:    Albuterol Sulfate (2.5 Mg/21ml) 0.083% Nebu (Albuterol sulfate) .[name]... Four times daily or every 6 hours as needed    Advair Diskus 500-50 Mcg/dose Misc (Fluticasone-salmeterol) ..... One puff twice daily  COPD end stage IV - followed by pulm + home hospice for same  Pulmonary Functions Reviewed: FEV1: 1.01 (12/06/2008)   FEV  25-75: 0.31 (12/06/2008)   O2 sat: 92 (03/14/2010)     Vaccines Reviewed: Pneumovax: Pneumovax (06/14/2008)   Flu Vax: Fluvax MCR (06/19/2009)  Problem # 3:  CANDIDIASIS OF UNSPECIFIED SITE (ICD-112.9)  hx recurrent thrush - also vaginal and skin - likely related to uncontrolled DM2 - tx diflucan x 3 days and inititate DM tx as above -  Orders: Prescription Created Electronically [phone])  Problem # 4:  CHRONIC RESPIRATORY FAILURE (ICD-518.83) see discussion with COPD as above chronic resp failure with Golds stage IV COPD cont hospice care cont inhaled meds as prescribed  Problem # 5:  HYPERLIPIDEMIA (ICD-272.4) need to check FLP next OV - pt  Complete Medication List: 1)  Bayer Low Strength 81 Mg Tbec (Aspirin) .... Take 1 tablet by mouth once a day 2)  Lasix 40 Mg Tabs (Furosemide) .... Take 1 tablet by mouth two times a day 3)  Klor-con 20 Meq Pack (Potassium chloride) .... Take 1 tablets by mouth twice a day 4)  Albuterol Sulfate (2.5 Mg/33ml) 0.083% Nebu (Albuterol sulfate) .... Four times daily or every 6 hours as needed 5)  Nexium 40 Mg Cpdr (Esomeprazole magnesium) .... Take 1 tablet by mouth once a day 6)  Advair Diskus 500-50 Mcg/dose Misc (Fluticasone-salmeterol) .... One puff twice daily 7)  Alprazolam 0.5 Mg Tabs (Alprazolam) .... Take one tab by mouth every 8 hours as needed 8)  Vitamin C 500 Mg Tabs (Ascorbic acid) .[name].. 1 by mouth daily 9)  Fish Oil 1200 Mg Caps (Omega-3 fatty acids) .[name].. 1 two times a day 10)  Vitamin E Complex 400 Unit Caps (Vitamin e) .... 2 by mouth daily 11)  Centrum Silver Tabs (Multiple vitamins-minerals) .[name].. 1 by mouth daily 12)  Benzonatate 100 Mg Caps (Benzonatate) .... 2 by mouth every 8 hours as needed 13)  Prednisone 10 Mg Tabs (Prednisone) .[name].. 1 once daily 14)  Magic Mouth Wash  .Marland KitchenMarland KitchenMarland Kitchen 15ml swish gargle expectorate three times daily as needed 15)  Microgaurd Powder  .... As directed 16)  Metformin Hcl 500 Mg Xr24h-tab (Metformin hcl)  .[name].. 1 by mouth two times a day 17)  Glimepiride 2 Mg Tabs (Glimepiride) .[name].. 1 by mouth two times a day 18)  Diflucan 150 Mg Tabs (Fluconazole) .[name].. 1 by mouth once daily x 3 days (for yeast) 19)  Onetouch Ultra Test Strp (Glucose blood) .... Check blood sugar two times a day  dx: 250.00 20)  Onetouch Lancets Misc (Lancets) .... Use two times a day  dx: 250.00  Patient Instructions: 1)  it was good to see you today. 2)  start medications for diabetes - metformin once daily x 7days, then increase to two times a day - 3)  also generic amaryl two times a day with food - 4)  check sugars two times a day and keep record of this - bring record to next appointment 5)  call if sugars over 250 or less than 70 - 6)  we'll make referral for diabetes education adn for diabetes eye check .  Our office will contact you regarding this appointment once made.  7)  also sent prescription for yeast medication 8)  your prescriptions have been electronically submitted to your pharmacy. Please take as directed. Contact our office if you believe you're having problems with the medication(s).  9)  Please schedule a follow-up appointment in 2 weeks to review sugars and medications, call sooner if problems.  Prescriptions: ONETOUCH LANCETS  MISC (LANCETS) use two times a day  dx: 250.00  #60 x 3   Entered by:   Orlan Leavens   Authorized by:   Newt Lukes MD   Signed by:   Newt Lukes MD on 03/14/2010   Method used:   [facility]ron[facility] to        [facility]  [address] [phone]* (retail)       [address]       [place], [place]  [address]       Ph: [phone] or [phone]       Fax: [phone]   RxID:   [id number] ONETOUCH ULTRA TEST  STRP (GLUCOSE BLOOD) check blood sugar two times a day  dx: 250.00  #60 x 3   Entered by:   Orlan Leavens   Authorized by:   Newt Lukes MD   Signed by:   Newt Lukes MD on 03/14/2010   Method used:   [facility]ron[facility] to         [facility]  [address] [phone]* (retail)       [address]       [place], [place]  [address]       Ph: [phone] or [phone]       Fax: [phone]   RxID:   [id number] DIFLUCAN 150 MG TABS (FLUCONAZOLE) 1 by mouth once daily x 3 days (for yeast)  #3 x 0   Entered and Authorized by:   Newt Lukes MD   Signed by:   Newt Lukes MD on 03/14/2010   Method used:   [facility]        [facility]  [address] [phone]* (retail)       [name]       [place], [place]  [address]       Ph: [phone] or [phone]       Fax: [phone]   RxID:   [id number] GLIMEPIRIDE 2 MG TABS (GLIMEPIRIDE) 1 by mouth two times a day  #60 x 2   Entered and Authorized by:   Newt Lukes MD   Signed by:   Newt Lukes MD on 03/14/2010   Method used:   [facility]ronically to        [facility]  [address] [phone]* (retail)       [address]       [place], [place]  [address]       Ph: [phone] or [phone]       Fax: [phone]   RxID:   [id number] METFORMIN HCL 500 MG XR24H-TAB (METFORMIN HCL) 1 by mouth two times a day  #60 x 1   Entered and Authorized by:   Newt Lukes MD   Signed by:   Newt Lukes MD on 03/14/2010   Method used:   [facility]  [facility]  [address] [phone]* (retail)       [address]       [place], [place]  [address]       Ph: [phone] or [phone]       Fax: [phone]   RxID:   [id number]

## 2010-10-04 NOTE — Letter (Signed)
Summary: [employer]  [employer]   Imported By: Lester Piedmont 04/09/2010 09:52:34  _____________________________________________________________________  External Attachment:    Type:   Image     Comment:   External Document

## 2010-10-04 NOTE — Progress Notes (Signed)
Summary: Lab results, needs PCP eval ASAP for DM new onset  Phone Note Outgoing Call   Reason for Call: Discuss lab or test results Summary of Call: I discussed lab results with the patient.  She knows she has uncontrolled diabetes.  I called primary care, Dr Yetta Barre is out of town, primary care will schedule an OV with another provider today phone note sent on to primary care  Initial call taken by: Storm Frisk MD,  March 14, 2010 10:37 AM  Follow-up for Phone Call        Please see other phone note, Pt is scheduled for office visit today Follow-up by: Lamar Sprinkles, CMA,  March 14, 2010 11:18 AM  Additional Follow-up for Phone Call Additional follow up Details #1::        ok    thanks   Additional Follow-up by: Storm Frisk MD,  March 14, 2010 11:32 AM

## 2010-10-04 NOTE — Progress Notes (Signed)
Summary: UA results  Phone Note Outgoing Call Call back at Home Phone [phone] Call back at [phone]   Call placed by: Arman Filter LPN,  March 13, 2010 3:59 PM Call placed to: Patient Summary of Call: per PW, UA was pos for glucose/sugars.  Therefore, pt needs to f/u with pcp Dr. Yetta Barre this week to discuss these results.  ATC pt at home.  NA and unable to leave a message.  Will try back later.  Aundra Millet Reynolds LPN  March 13, 2010 4:00 PM    I again ATC pt at home #. NA and unable to leave message.  ( I found her cell # in a previous phone note- [phone]) and [id number].  Aundra Millet Reynolds LPN  March 13, 2010 4:52 PM   ATC pt at home # .  NA and unable to leave message.  Due to the lateness of the day and the office closing, I called pt on her cell and LM  informing pt that d/t her labwork today she needed to f/u with Dr. Yetta Barre (her pcp) this week.  Also instructed pt to go to nearest ER if symptoms worsened or she felt she needed immediate attention.  Will attempt to call pt again tomorrow morning.  Arman Filter LPN  March 13, 2010 5:26 PM   Follow-up for Phone Call        pt called back.  Returning call to [facility] from yesterday.  Please call her on her at home # [phone].  She is waiting for call. Follow-up by: Eugene Gavia,  March 14, 2010 10:11 AM  Additional Follow-up for Phone Call Additional follow up Details #1::        Spoke w/pt, she is scheduled for office visit today with Dr Felicity Coyer in Dr Yetta Barre absence. Additional Follow-up by: Lamar Sprinkles, CMA,  March 14, 2010 10:24 AM

## 2010-10-05 ENCOUNTER — Telehealth: Payer: Self-pay | Admitting: Emergency Medicine

## 2010-10-08 ENCOUNTER — Encounter: Payer: Self-pay | Admitting: Critical Care Medicine

## 2010-10-10 NOTE — Miscellaneous (Signed)
Summary: Medication order/Hospice @ [facility]  Medication order/Hospice @ [facility]   Imported By: Sherian Rein 10/04/2010 14:56:55  _____________________________________________________________________  External Attachment:    Type:   Image     Comment:   External Document

## 2010-10-11 ENCOUNTER — Telehealth: Payer: Self-pay | Admitting: Internal Medicine

## 2010-10-18 NOTE — Miscellaneous (Signed)
Summary: Plan/[place]  Plan/[place]   Imported By: Lester Gillespie 10/12/2010 13:49:00  _____________________________________________________________________  External Attachment:    Type:   Image     Comment:   External Document

## 2010-10-18 NOTE — Progress Notes (Signed)
Summary: fyi  Phone Note Other Incoming Call back at [phone]   Caller: ann batton//[facility] Summary of Call: FYI: Pt is being discharged from hospice effective today and states that pt would appreciate any samples we can provide for her in ref to her meds. Initial call taken by: Darletta Moll,  October 05, 2010 4:47 PM  Follow-up for Phone Call        ok for samples Follow-up by: Storm Frisk MD,  October 05, 2010 5:46 PM  Additional Follow-up for Phone Call Additional follow up Details #1::        Sample of spiriva, ventolin and advair left up front.  Pt aware.  Additional Follow-up by: Vernie Murders,  October 08, 2010 8:55 AM

## 2010-10-18 NOTE — Progress Notes (Signed)
Summary: pravastatin  Phone Note Refill Request Message from:  Fax from Pharmacy on October 11, 2010 8:13 AM  Refills Requested: Medication #1:  PRAVASTATIN SODIUM 40 MG TABS 1 tab by mouth once daily   [facility]   Method Requested: Electronic Initial call taken by: Orlan Leavens RMA,  October 11, 2010 8:13 AM    Prescriptions: PRAVASTATIN SODIUM 40 MG TABS (PRAVASTATIN SODIUM) 1 tab by mouth once daily  #30 Tablet x 5   Entered by:   Orlan Leavens RMA   Authorized by:   Newt Lukes MD   Signed by:   Orlan Leavens RMA on 10/11/2010   Method used:   [facility]        [facility]  [address] [phone]* (retail)       [address]       [place], [place]  [id number]       Ph: [phone] or [phone]       Fax: [phone]   RxID:   [id number]

## 2010-11-06 DIAGNOSIS — E039 Hypothyroidism, unspecified: Secondary | ICD-10-CM | POA: Insufficient documentation

## 2010-11-06 DIAGNOSIS — C50911 Malignant neoplasm of unspecified site of right female breast: Secondary | ICD-10-CM | POA: Insufficient documentation

## 2010-11-08 ENCOUNTER — Telehealth: Payer: Self-pay | Admitting: Critical Care Medicine

## 2010-11-20 NOTE — Progress Notes (Signed)
Summary: ventolin PA > change to proair  Phone Note Outgoing Call Call back at [phone]   Call placed by: Vernie Murders,  November 08, 2010 2:37 PM Call placed to: Insurer Summary of Call: Recieved PA request form from [facility] [address].  Called to initiate PA and will await form to be recieved for PW to complete.  Initial call taken by: Vernie Murders,  November 08, 2010 2:39 PM  Follow-up for Phone Call        Recieved the PA form from [employer] and placed in PW's lookat.  Looks like proair is an alternative that is covered.  Do you wish to proceed with PA or give pt rx for proair? Pls advise thanks Follow-up by: Vernie Murders,  November 08, 2010 2:53 PM  Additional Follow-up for Phone Call Additional follow up Details #1::        ok to change to proair Additional Follow-up by: Storm Frisk MD,  November 11, 2010 2:13 PM    Additional Follow-up for Phone Call Additional follow up Details #2::    [facility]ed [facility], spoke with Mia.  She was informed ok to change ventolin to proair per Dr. Delford Field.  She verbalized understanding.  Called, spoke with pt.  She was informed ventolin changed to [employer] purposes.  Per pt, she picked up proair rx this past weekend at pharm.  States it does not seem to be working as well as the ventolin.  I advised pt they are both albuterol with different name brands.  Pt verbalized understanding of this but still does not think the proair works as well as the ventolin.  Dr. Delford Field, would you like to try prior auth for ventolin.  Pls advise.  Thanks! Follow-up by: Gweneth Dimitri RN,  November 12, 2010 10:08 AM  Additional Follow-up for Phone Call Additional follow up Details #3:: Details for Additional Follow-up Action Taken: no go with proair for now and use 3 - 4 puffs every 4-6 hours as needed and obtain an aerochamber for the proair  Patient phoned and stated that she has been speaking with Crystal and would like a call back she can be reached at  [phone].Vedia Coffer  November 14, 2010 11:36 AM Additional Follow-up by: Storm Frisk MD,  November 12, 2010 2:32 PM  New/Updated Medications: PROAIR HFA 108 (90 BASE) MCG/ACT AERS (ALBUTEROL SULFATE) 3-4 puffs every 4-6 hours as needed with aerochamber * AEROCHAMBER use with proair Prescriptions: PROAIR HFA 108 (90 BASE) MCG/ACT AERS (ALBUTEROL SULFATE) 3-4 puffs every 4-6 hours as needed with aerochamber  #1 x 6   Entered by:   Gweneth Dimitri RN   Authorized by:   Storm Frisk MD   Signed by:   Gweneth Dimitri RN on 11/14/2010   Method used:   [facility]phoned to ...       [facility]  [address] [phone]ail)       [address]       [place], [place]  [id number]       Ph: [phone] or [phone]       Fax: [phone]   RxID:   [id number] AEROCHAMBER use with proair  #1 x 0   Entered by:   Gweneth Dimitri RN   Authorized by:   Storm Frisk MD   Signed by:   Gweneth Dimitri RN on 11/14/2010   Method used:   Print then Give  to Patient   RxID:   [id number]   ATC pt's home number - no answer and unable to leave message.  WCB Crystal Jones RN  November 13, 2010 5:00 PM  Called, spoke with pt.  She was informed, per PW, to use proair 3-4 puffs every 4-6 hours as needed.  Advised he would like her to use aerochamber with the proair.  She verbalized understanding of this and will come by this evening to pick up the aerochamber and review technique.    [facility], spoke with [facility].  Per Mia, she will need additional clarrification on the above proair instructions because she states this is a really high dose for this pt.  States pt filled proair on 11/09/10 and was requesting this to be filled again on Monday.  Advised I would call pt for additional clarrification and clarrify with PW.  Spoke with pt.  States she is doing 2 puffs probably every 2 hours "because I don't feel like I'm getting anything out when I use it." I explained to pt if she is using this  medication this much she should come in to be see.  Tried to offer OV but pt declined at this time.  Dr. Delford Field, pls clarrify -- proair 3-4 puffs every 4-6 hours as needed.  Pt to use aerochamber with the proair.  Thanks! Crystal Jones RN  November 14, 2010 10:49 AM    Use , as I have previously stated,  3-4 puffs every 4-6 hours with aerochamber  p Edenilson Austad   [facility], spoke with [facility]ia.  She was informed Dr. Delford Field would like pt to use proair 3-4 puffs every 4-6 hours as needed with the aerochamber.  She verbalized understanding of this.  Pt aware of above recs per PW and verbalized understanding of this.  She will call office back if she needs to use it more frequently than directed.  **Note: pt picked up aerochamber today and was instructed how to use this by [facility].   Gweneth Dimitri RN  November 14, 2010 5:24 PM

## 2010-11-21 ENCOUNTER — Encounter: Payer: Self-pay | Admitting: *Deleted

## 2010-11-26 ENCOUNTER — Emergency Department (HOSPITAL_COMMUNITY)
Admission: EM | Admit: 2010-11-26 | Discharge: 2010-11-26 | Disposition: A | Discharge status: Home / Self Care | Payer: Medicare Other | Attending: Emergency Medicine | Admitting: Emergency Medicine

## 2010-11-26 DIAGNOSIS — J4489 Other specified chronic obstructive pulmonary disease: Secondary | ICD-10-CM | POA: Insufficient documentation

## 2010-11-26 DIAGNOSIS — E785 Hyperlipidemia, unspecified: Secondary | ICD-10-CM | POA: Insufficient documentation

## 2010-11-26 DIAGNOSIS — Z79899 Other long term (current) drug therapy: Secondary | ICD-10-CM | POA: Insufficient documentation

## 2010-11-26 DIAGNOSIS — L2989 Other pruritus: Secondary | ICD-10-CM | POA: Insufficient documentation

## 2010-11-26 DIAGNOSIS — J449 Chronic obstructive pulmonary disease, unspecified: Secondary | ICD-10-CM | POA: Insufficient documentation

## 2010-11-26 DIAGNOSIS — M79609 Pain in unspecified limb: Secondary | ICD-10-CM | POA: Insufficient documentation

## 2010-11-26 DIAGNOSIS — L02419 Cutaneous abscess of limb, unspecified: Secondary | ICD-10-CM | POA: Insufficient documentation

## 2010-11-26 DIAGNOSIS — F341 Dysthymic disorder: Secondary | ICD-10-CM | POA: Insufficient documentation

## 2010-11-26 DIAGNOSIS — S90569A Insect bite (nonvenomous), unspecified ankle, initial encounter: Secondary | ICD-10-CM | POA: Insufficient documentation

## 2010-11-26 DIAGNOSIS — Z853 Personal history of malignant neoplasm of breast: Secondary | ICD-10-CM | POA: Insufficient documentation

## 2010-11-26 DIAGNOSIS — L298 Other pruritus: Secondary | ICD-10-CM | POA: Insufficient documentation

## 2010-11-29 ENCOUNTER — Other Ambulatory Visit: Payer: Self-pay | Admitting: *Deleted

## 2010-11-29 MED ORDER — POTASSIUM CHLORIDE CRYS ER 20 MEQ PO TBCR: 20.0000 meq | EXTENDED_RELEASE_TABLET | Freq: Two times a day (BID) | ORAL | Status: DC

## 2010-12-20 ENCOUNTER — Other Ambulatory Visit: Payer: Self-pay | Admitting: Critical Care Medicine

## 2010-12-24 ENCOUNTER — Other Ambulatory Visit (INDEPENDENT_AMBULATORY_CARE_PROVIDER_SITE_OTHER): Payer: Medicare Other

## 2010-12-24 ENCOUNTER — Telehealth: Payer: Self-pay | Admitting: Internal Medicine

## 2010-12-24 ENCOUNTER — Ambulatory Visit (INDEPENDENT_AMBULATORY_CARE_PROVIDER_SITE_OTHER): Payer: Medicare Other | Admitting: Critical Care Medicine

## 2010-12-24 ENCOUNTER — Encounter: Payer: Self-pay | Admitting: Internal Medicine

## 2010-12-24 ENCOUNTER — Ambulatory Visit (INDEPENDENT_AMBULATORY_CARE_PROVIDER_SITE_OTHER): Payer: Medicare Other | Admitting: Internal Medicine

## 2010-12-24 ENCOUNTER — Encounter: Payer: Self-pay | Admitting: Critical Care Medicine

## 2010-12-24 VITALS — BP 120/82 | HR 83 | Temp 98.1°F | Ht 62.5 in | Wt 210.0 lb

## 2010-12-24 VITALS — BP 118/68 | HR 83 | Temp 98.6°F | Ht 62.0 in | Wt 208.8 lb

## 2010-12-24 DIAGNOSIS — F419 Anxiety disorder, unspecified: Secondary | ICD-10-CM

## 2010-12-24 DIAGNOSIS — J449 Chronic obstructive pulmonary disease, unspecified: Secondary | ICD-10-CM

## 2010-12-24 DIAGNOSIS — F329 Major depressive disorder, single episode, unspecified: Secondary | ICD-10-CM

## 2010-12-24 DIAGNOSIS — E119 Type 2 diabetes mellitus without complications: Secondary | ICD-10-CM

## 2010-12-24 DIAGNOSIS — E785 Hyperlipidemia, unspecified: Secondary | ICD-10-CM

## 2010-12-24 DIAGNOSIS — F411 Generalized anxiety disorder: Secondary | ICD-10-CM

## 2010-12-24 LAB — BASIC METABOLIC PANEL
BUN: 18 mg/dL (ref 6–23)
GFR: 57.47 mL/min — ABNORMAL LOW (ref >60.00)
Potassium: 4.5 mEq/L (ref 3.5–5.1)
Sodium: 140 mEq/L (ref 135–145)

## 2010-12-24 LAB — HEMOGLOBIN A1C: Hgb A1c MFr Bld: 6.8 % — ABNORMAL HIGH (ref 4.6–6.5)

## 2010-12-24 MED ORDER — ALPRAZOLAM 0.5 MG PO TABS: 0.5000 mg | ORAL_TABLET | Freq: Three times a day (TID) | ORAL | Status: DC | PRN

## 2010-12-24 NOTE — Patient Instructions (Signed)
No change in medications. Return in   3 months 

## 2010-12-24 NOTE — Assessment & Plan Note (Signed)
COPD severe , oxygen dependent Plan No change in inhaled or maintenance medications. Return in   4 mo

## 2010-12-24 NOTE — Assessment & Plan Note (Signed)
O2 dep, follows with pulm and home hospice - symptoms stable - The current medical regimen is effective;  continue present plan and medications.

## 2010-12-24 NOTE — Assessment & Plan Note (Signed)
On statin - The current medical regimen is effective;  continue present plan and medications. Lab Results  Component Value Date   LDLCALC 87 06/26/2010   Lab Results  Component Value Date   ALT 23 09/25/2010   AST 22 09/25/2010   ALKPHOS 48 09/25/2010   BILITOT 0.6 09/25/2010

## 2010-12-24 NOTE — Progress Notes (Signed)
  Subjective:    Patient ID: Kaitlin Henderson, female    DOB: [birth year], 70 y.o.   MRN: [id number]  HPI  here for follow up - reviewed chronic medical issues:  DM2 - dx 03/2010 by routine pulm labs started on metformin + OSA 03/2010 reports compliance with ongoing medical treatment and no changes in medication dose or frequency. denies adverse side effects related to current therapy. no hypoglycemic events or symptoms - checks sugars 2x/day - home cbg log reviewed - range 101-170s, lowest in AM   COPD - advanced dz, freq exac - follows with pulm and enrolled with hospice for same since 06/2009 - chronic pred + cont O2 use - daily DOE but no cough or flares at this time - reports compliance with ongoing medical treatment and no changes in medication dose or frequency. denies adverse side effects related to current therapy.   breast cancer - >37yr out - dx 1998 s/p chemo and xrt - s/p r mastect for same -   anxiety - largely realetd to trouble breathing and fear of being alone - "i have no one" since spouse expired and children are "distantly involved" per her report - uses BZ as needed - increase depression symptoms and sadness  Past Medical History  Diagnosis Date  . Breast cancer, right breast 1998    s/p chemo t XRT and right mastectomy  . OBESITY   . CORONARY ARTERY DISEASE 08/17/2007  . RESPIRATORY FAILURE 08/17/2007  . APHTHOUS STOMATITIS   . DEPRESSION   . DIABETES, TYPE 2 dx 03/2010  . HYPERLIPIDEMIA   . Hypothyroidism   . C O P D     chronic O2, hospice     Review of Systems  Constitutional: Positive for fatigue. Negative for fever.  Cardiovascular: Negative for chest pain.  Genitourinary: Negative for dysuria and frequency.  Neurological: Negative for headaches.       Objective:   Physical Exam  Constitutional: She is oriented to person, place, and time. She appears well-developed and well-nourished. No distress.  HENT:  Head: Normocephalic and atraumatic.  Eyes:  Conjunctivae and EOM are normal. Pupils are equal, round, and reactive to light.  Cardiovascular: Normal rate, regular rhythm and normal heart sounds.   Pulmonary/Chest: Effort normal. No respiratory distress. She has no wheezes.       diminished air movement bilaterally  Neurological: She is alert and oriented to person, place, and time. No cranial nerve deficit. Coordination normal.  Psychiatric:       Depressed, occ tearful - but appropriate insight and judgement       Lab Results  Component Value Date   WBC 8.5 03/13/2010   HGB 16.3* 03/13/2010   HCT 46.5* 03/13/2010   PLT 216.0 03/13/2010   CHOL 171 06/26/2010   TRIG 158.0* 06/26/2010   HDL 52.00 06/26/2010   ALT 23 09/25/2010   AST 22 09/25/2010   NA 137 03/13/2010   K 4.8 09/25/2010   CL 97 03/13/2010   CREATININE 0.9 09/25/2010   BUN 9 03/13/2010   CO2 30 03/13/2010   TSH 0.64 06/26/2010   HGBA1C 6.8* 09/25/2010   Assessment & Plan:  See problem list. Medications and labs reviewed today.

## 2010-12-24 NOTE — Telephone Encounter (Signed)
Called pt concerning A1c results. Pt states md was going to check her electrolytes due to her potassium being low. Wanting to know did md send order also. Pls advise

## 2010-12-24 NOTE — Telephone Encounter (Signed)
Please call patient - normal a1c results. No medication changes recommended. Thanks.   Lab Results  Component Value Date   HGBA1C 6.8* 12/24/2010

## 2010-12-24 NOTE — Assessment & Plan Note (Signed)
Improved since addition of SSRI 09/2010 - The current medical regimen is effective with prn BZs;  continue present plan and medications.

## 2010-12-24 NOTE — Progress Notes (Signed)
Subjective:    Patient ID: Kaitlin Henderson, female    DOB: 14-May-1941, 70 y.o.   MRN: [id number]  HPI 70 y.o.WF copd 12/24/2010 Occ mucus white like pearl .   Gets out thick mucus.  Dyspnea is the same. Mood better on antidepressant. No real chest pain.  The pt is now out of hospice program Pt denies any significant sore throat, nasal congestion or excess secretions, fever, chills, sweats, unintended weight loss, pleurtic or exertional chest pain, orthopnea PND, or leg swelling Pt denies any increase in rescue therapy over baseline, denies waking up needing it or having any early am or nocturnal exacerbations of coughing/wheezing/or dyspnea. Pt also denies any obvious fluctuation in symptoms with  weather or environmental change or other alleviating or aggravating factors    Past Medical History  Diagnosis Date  . Breast cancer, right breast 1998    s/p chemo t XRT and right mastectomy  . OBESITY   . CORONARY ARTERY DISEASE 08/17/2007  . RESPIRATORY FAILURE 08/17/2007  . APHTHOUS STOMATITIS   . DEPRESSION   . DIABETES, TYPE 2 dx 03/2010  . HYPERLIPIDEMIA   . Hypothyroidism   . C O P D     chronic O2     Family History  Problem Relation Age of Onset  . Diabetes Neg Hx   . Cancer Neg Hx      History   Social History  . Marital Status: Widowed    Spouse Name: N/A    Number of Children: N/A  . Years of Education: N/A   Occupational History  . Not on file.   Social History Main Topics  . Smoking status: Former Smoker -- 2.5 packs/day for 54 years    Types: Cigarettes    Quit date: 09/02/2006  . Smokeless tobacco: Never Used   Comment: Retired. Widow/widower since 2008-lives alone. Home hospice since 06/2009 related to COPD  . Alcohol Use: No  . Drug Use: No  . Sexually Active: Not on file   Other Topics Concern  . Not on file   Social History Narrative  . No narrative on file     Allergies  Allergen Reactions  . Codeine     REACTION: makes pt pass out    . Sulfonamide Derivatives     REACTION: edema     Outpatient Prescriptions Prior to Visit  Medication Sig Dispense Refill  . ADVAIR DISKUS 500-50 MCG/DOSE AEPB INHALE 1 PUFF TWICE A DAY  1 each  12  . albuterol (PROVENTIL) (2.5 MG/3ML) 0.083% nebulizer solution Take 2.5 mg by nebulization 4 (four) times daily as needed.        [name] albuterol (VENTOLIN HFA) 108 (90 BASE) MCG/ACT inhaler Inhale 2 puffs into the lungs every 6 (six) hours as needed.        . Ascorbic Acid (VITAMIN C) 500 MG tablet Take 500 mg by mouth daily.        [name] aspirin 81 MG tablet Take 81 mg by mouth daily.        . benzonatate (TESSALON) 100 MG capsule Take 100 mg by mouth every 8 (eight) hours as needed.       [name] esomeprazole (NEXIUM) 40 MG capsule Take 40 mg by mouth daily before breakfast.        . glimepiride (AMARYL) 2 MG tablet Take 1/2 by mouth every morning if cbg > 150      . Glucose Blood (ONETOUCH ULTRA BLUE VI) by In Vitro route. Use two  times a day to check blood sugars       . ibuprofen (ADVIL,MOTRIN) 200 MG tablet Take 200 mg by mouth every 6 (six) hours as needed.        . metFORMIN (GLUCOPHAGE-XR) 500 MG 24 hr tablet Take 500 mg by mouth 2 (two) times daily.        . Multiple Vitamins-Minerals (CENTRUM SILVER) tablet Take 1 tablet by mouth daily.        . Omega-3 Fatty Acids (FISH OIL) 1200 MG CAPS Take by mouth 2 (two) times daily.        . ONE TOUCH LANCETS MISC by Does not apply route. Use two times a day to check blood sugars        . OXYGEN-HELIUM IN Inhale into the lungs. Use 3ml continuous       . pravastatin (PRAVACHOL) 40 MG tablet Take 40 mg by mouth daily.        . predniSONE (DELTASONE) 10 MG tablet Take 10 mg by mouth daily.        . sennosides-docusate sodium (SENOKOT-S) 8.6-50 MG tablet Take 1 tablet by mouth at bedtime as needed.        . sertraline (ZOLOFT) 25 MG tablet Take 25 mg by mouth daily.        [name] tiotropium (SPIRIVA) 18 MCG inhalation capsule Place 18 mcg into inhaler and inhale  daily.        . vitamin E 400 UNIT capsule Take 400 Units by mouth daily.        [name] ALPRAZolam (XANAX) 0.5 MG tablet Take 0.5 mg by mouth every 8 (eight) hours as needed.        . furosemide (LASIX) 40 MG tablet Take 40 mg by mouth 2 (two) times daily.        . potassium chloride SA (K-DUR,KLOR-CON) 20 MEQ tablet Take 1 tablet (20 mEq total) by mouth 2 (two) times daily.  60 tablet  2     Review of Systems Constitutional:   No  weight loss, night sweats,  Fevers, chills, fatigue, lassitude. HEENT:   No headaches,  Difficulty swallowing,  Tooth/dental problems,  Sore throat,                No sneezing, itching, ear ache, nasal congestion, post nasal drip,   CV:  No chest pain,  Orthopnea, PND, swelling in lower extremities, anasarca, dizziness, palpitations  GI  No heartburn, indigestion, abdominal pain, nausea, vomiting, diarrhea, change in bowel habits, loss of appetite  Resp: Notes  shortness of breath with exertion not  at rest.  No excess mucus, no productive cough,  No non-productive cough,  No coughing up of blood.  No change in color of mucus.  No wheezing.  No chest wall deformity  Skin: no rash or lesions.  GU: no dysuria, change in color of urine, no urgency or frequency.  No flank pain.  MS:  No joint pain or swelling.  No decreased range of motion.  No back pain.  Psych:  No change in mood or affect. No depression or anxiety.  No memory loss. t    Objective:   Physical Exam Gen: Pleasant, well-nourished, in no distress,  normal affect  ENT: No lesions,  mouth clear,  oropharynx clear, no postnasal drip  Neck: No JVD, no TMG, no carotid bruits  Lungs: No use of accessory muscles, no dullness to percussion, clear without rales or rhonchi  Cardiovascular: RRR, heart sounds normal, no murmur  or gallops, no peripheral edema  Abdomen: soft and NT, no HSM,  BS normal  Musculoskeletal: No deformities, no cyanosis or clubbing  Neuro: alert, non focal  Skin: Warm, no  lesions or rashes       PFT Conversion 12/06/2008  FVC 1.95  FVC PREDICT 2.76  FVC  % Predicted 71  FEV1 1.01  FEV1 PREDICT 1.97  FEV % Predicted 51  FEV1/FVC 51.8  FEV1/FVC PRE 72  FEV1/FVC%EXP   FeF 25-75 0.31  FeF 25-75 % Predicted 2.29  FEF % EXPEC 13  POST FVC 2.08  FVCPRDPST 2.76  POST FVC%EXP 75  POST FEV1 1.16  FEV1PRDPST 1.97  POSTFEV1%PRD 59  PSTFEV1/FVC 56  FEV1FVCPRDPS 72  PSTFEF25/75% 0.46  PSTFEF25/75P 2.29  FEF2575%EXPS 20  Residual Volume (ml) 2.86  RV % EXPECT 156  DLCO (ml/mmHg sec) 12.8  DLCO % EXPEC 53  DLCO/VA 4.35  DLCO/VA%EXP 122  PFT RSLT severe obstruction with significant bronchodilator response   PFT Conversion 09/25/2010  FVC   FVC PREDICT 2.755  FVC  % Predicted 54.77  FEV1   FEV1 PREDICT 2.081  FEV % Predicted 42.52  FEV1/FVC   FEV1/FVC PRE 75.934  FEV1/FVC%EXP 77.220  FeF 25-75   FeF 25-75 % Predicted   FEF % EXPEC 27.860  POST FVC   FVCPRDPST   POST FVC%EXP   POST FEV1   FEV1PRDPST   POSTFEV1%PRD   PSTFEV1/FVC   FEV1FVCPRDPS   PSTFEF25/75%   PSTFEF25/75P   FEF2575%EXPS   Residual Volume (ml)   RV % EXPECT   DLCO (ml/mmHg sec)   DLCO % EXPEC   DLCO/VA   DLCO/VA%EXP   PFT RSLT    Assessment & Plan:   C O P D COPD severe , oxygen dependent Plan No change in inhaled or maintenance medications. Return in   4 mo     Updated Medication List Outpatient Encounter Prescriptions as of 12/24/2010  Medication Sig Dispense Refill  . ADVAIR DISKUS 500-50 MCG/DOSE AEPB INHALE 1 PUFF TWICE A DAY  1 each  12  . albuterol (PROVENTIL) (2.5 MG/3ML) 0.083% nebulizer solution Take 2.5 mg by nebulization 4 (four) times daily as needed.        [name] albuterol (VENTOLIN HFA) 108 (90 BASE) MCG/ACT inhaler Inhale 2 puffs into the lungs every 6 (six) hours as needed.        . ALPRAZolam (XANAX) 0.5 MG tablet Take 1 tablet (0.5 mg total) by mouth every 8 (eight) hours as needed.  30 tablet  5  . Ascorbic Acid (VITAMIN C) 500 MG tablet  Take 500 mg by mouth daily.        [name] aspirin 81 MG tablet Take 81 mg by mouth daily.        . benzonatate (TESSALON) 100 MG capsule Take 100 mg by mouth every 8 (eight) hours as needed.       [name] esomeprazole (NEXIUM) 40 MG capsule Take 40 mg by mouth daily before breakfast.        . glimepiride (AMARYL) 2 MG tablet Take 1/2 by mouth every morning if cbg > 150      . Glucose Blood (ONETOUCH ULTRA BLUE VI) by In Vitro route. Use two times a day to check blood sugars       . ibuprofen (ADVIL,MOTRIN) 200 MG tablet Take 200 mg by mouth every 6 (six) hours as needed.        . metFORMIN (GLUCOPHAGE-XR) 500 MG 24 hr tablet Take 500 mg  by mouth 2 (two) times daily.        . Multiple Vitamins-Minerals (CENTRUM SILVER) tablet Take 1 tablet by mouth daily.        . Omega-3 Fatty Acids (FISH OIL) 1200 MG CAPS Take by mouth 2 (two) times daily.        . ONE TOUCH LANCETS MISC by Does not apply route. Use two times a day to check blood sugars        . OXYGEN-HELIUM IN Inhale into the lungs. Use 3ml continuous       . pravastatin (PRAVACHOL) 40 MG tablet Take 40 mg by mouth daily.        . predniSONE (DELTASONE) 10 MG tablet Take 10 mg by mouth daily.        . sennosides-docusate sodium (SENOKOT-S) 8.6-50 MG tablet Take 1 tablet by mouth at bedtime as needed.        . sertraline (ZOLOFT) 25 MG tablet Take 25 mg by mouth daily.        [name] tiotropium (SPIRIVA) 18 MCG inhalation capsule Place 18 mcg into inhaler and inhale daily.        . vitamin E 400 UNIT capsule Take 400 Units by mouth daily.        [name] DISCONTD: ALPRAZolam (XANAX) 0.5 MG tablet Take 0.5 mg by mouth every 8 (eight) hours as needed.        [name] DISCONTD: furosemide (LASIX) 40 MG tablet Take 40 mg by mouth 2 (two) times daily.        [name] DISCONTD: potassium chloride SA (K-DUR,KLOR-CON) 20 MEQ tablet Take 1 tablet (20 mEq total) by mouth 2 (two) times daily.  60 tablet  2

## 2010-12-24 NOTE — Patient Instructions (Signed)
It was good to see you today. Test(s) ordered today. Your results will be called to you after review (48-72hours after test completion). If any changes need to be made, you will be notified at that time. Medications reviewed, make the following changes at this time: reduce dose of furosemide and K-dur (unless labs suggest need to remain on 2x/day) Please schedule followup in 3-4 months for diabetes, call sooner if problems.

## 2010-12-24 NOTE — Assessment & Plan Note (Signed)
The current medical regimen is effective;  continue present plan and medications.  Home cbg 95-150s - reasonable control for state of other health issues Lab Results  Component Value Date   HGBA1C 6.8* 09/25/2010

## 2010-12-25 ENCOUNTER — Telehealth: Payer: Self-pay | Admitting: Critical Care Medicine

## 2010-12-25 NOTE — Telephone Encounter (Signed)
Yes - potassium and kidney fx also checked and were fine - ok to take reduced dose potassium and furosemide as rx'd at OV - thanks

## 2010-12-25 NOTE — Telephone Encounter (Signed)
Tried to call pt again still no ansew x's 10 rings...12/25/10@4 :52pm/LMB

## 2010-12-25 NOTE — Telephone Encounter (Signed)
Tried to call pt no ansew x's 10 rings...12/25/10@12 :10pm/LMB

## 2010-12-25 NOTE — Telephone Encounter (Signed)
According to last ov note 12/24/10 pt was printed and given rx. LMOMTCB X1

## 2010-12-25 NOTE — Telephone Encounter (Signed)
Pt returned call from triage- call home # [phone]. Kaitlin Henderson

## 2010-12-25 NOTE — Telephone Encounter (Signed)
Spoke w/ pt and she states she was not given a rx and was told this was going to be called in. Medication was not called in. I called and spoke w/ Arlys John [facility] and advised him of pt alprazolam rx. He verbalized understanding. Pt also aware rx was called into pharmacy and nothing further was needed

## 2010-12-26 NOTE — Telephone Encounter (Signed)
Notified pt with results of potassium & kidney fx.Marland KitchenMarland Kitchen4/25/12@3 :54pm/LMB

## 2011-01-15 NOTE — Discharge Summary (Signed)
Kaitlin Henderson, Kaitlin Henderson             ACCOUNT NO.:  [id number]   MEDICAL RECORD NO.:  [id number]          PATIENT TYPE:  INP   LOCATION:  2037                         FACILITY:  [facility]   PHYSICIAN:  Herbie Saxon, MDDATE OF BIRTH:  [birth year]   DATE OF ADMISSION:  08/05/2007  DATE OF DISCHARGE:                               DISCHARGE SUMMARY   ADDENDUM   DISCHARGE DIAGNOSIS:  Remains the same.   DISCHARGE MEDICATIONS:  1. Prednisone taper 40 mg daily for 4 days, 30 mg daily for 4 days, 20      mg daily for 4 days, 10 mg daily for 4 days, 5 mg daily for 4 days      and discontinue.  2. Avelox 400 mg daily for 3 more days.  3. Enteric-coated aspirin 81 mg daily.  4. Lasix 40 mg b.i.d.  5. K-Dur 20 mEq daily.  6. Spiriva 18 mcg 1 puff daily.  7. Xopenex 0.63 mg via nebulizer q.4h. p.r.n.  8. Hydrocortisone 0.5% cream topically, bilateral legs.  9. Nexium 40 mg daily.  10.Advair Diskus 50/250 one puff twice daily.  11.Alprazolam 0.4 mg q.8h. p.r.n.  12.Pravastatin 20 mg q.h.s.  13.Albuterol 2 puffs q.6h. p.r.n.   FOLLOWUP:  Follow up with primary care physician, Dr. Leslee Home, in 3  to 5 days.  Follow up with Dr. Delford Field, pulmonologist, in 1 week.   DISCHARGE CONDITION:  Stable.   DIET:  Heart healthy, low cholesterol, 1800 calorie ADA.   ACTIVITY:  Increased slowly.   FOLLOWUP:  He is to be followed up by [facility] for nebulizer  and oxygen needs.   PHYSICAL EXAMINATION:  GENERAL:  This is an elderly lady, not in acute  distress.  VITAL SIGNS:  Temperature 98, pulse 90, respiratory rate 20, blood  pressure 129/88.  Glucose is 119.  HEENT:  Pupils are equal and reactive to light and accommodation.  She  is not tachypneic.  Head is atraumatic, normocephalic.  Mucous membranes  are moist.  NECK:  Supple.  There is no elevated JVP or carotid bruits.  CHEST:  Clinically clearer.  A few basilar rales.  ABDOMEN:  Truncal obesity.  HEART:  Heart sounds 1, 2  and 3 are regular.  EXTREMITIES:  Peripheral pulses reduced.  There is chronic  lichenification and chronic discoloration of  both legs, with trace  pedal edema.   Labs show a cardiac BNP of 48.  Chemistry shows a sodium of 151,  potassium 3.8, chloride 91, bicarbonate 40, glucose 117, BUN 26,  creatinine 0.8.  WBC 7, hematocrit 52, platelet count 254.  The chest x-  ray on August 11, 2007, showed improvement in congestive heart failure  with decreasing bibasilar atelectasis, diffuse interstitial edema.  The  patient is to have a repeat chest x-ray in 1 to 2 weeks to follow up  with the primary care physician and pulmonologist  .  She verbalizes  understanding.      Herbie Saxon, MD  Electronically Signed     MIO/MEDQ  D:  08/12/2007  T:  08/12/2007  Job:  [id number]

## 2011-01-15 NOTE — H&P (Signed)
[name], [name]             ACCOUNT NO.:  [id number]   MEDICAL RECORD NO.:  [id number]          PATIENT TYPE:  EMS   LOCATION:  MAJO                         FACILITY:  [facility]   PHYSICIAN:  Hettie Holstein, D.O.    DATE OF BIRTH:  November 21, 1940   DATE OF ADMISSION:  08/05/2007  DATE OF DISCHARGE:                              HISTORY & PHYSICAL   PRIMARY CARE PHYSICIAN:  Dr. Leslee Home.   CHIEF COMPLAINT:  Swelling and shortness of breath.   HISTORY OF PRESENT ILLNESS:  Kaitlin Henderson is a 70 year old, morbid  obesity female with a previous history of COPD, oxygen dependent, who  for at 3 weeks now has been having progressive swelling and some  shortness of breath, as well as daytime lethargy.  She was directed to  undergo a sleep study for the evaluation of sleep apnea in the  outpatient setting.  However, due to insurance reasons, she could not  pursue this study.  In the interim, she has been progressively  worsening.  She was seen in her primary care physician's, Dr. Lorenz Coaster,  office today, noted to have anasarca and chronic edematous appearance of  his lower extremities, as well as her abdomen.  In the emergency  department, she was noted to have edema on her chest radiograph.  EKG  was not reflective of acute ischemic changes.  She is being admitted for  further treatment of congestive heart failure and further workup as she  has not had prior diagnosis of either hypertension or coronary artery  disease, neither is she aware of coronary disease in her family.   PAST MEDICAL HISTORY:  Significant for:  1. Breast cancer in 1998, where she underwent chemotherapy, as well as      radiation.  Her oncologist is in [place].  2. Hyperlipidemia.  3. Chronic obstructive pulmonary disease.  4. Obesity.  5. Former tobacco dependence.  She quit less than 1 year ago.   PAST SURGICAL HISTORY:  Includes:  1. Hysterectomy, she retains her ovaries.  2. Status post cholecystectomy.  3. Hernia  repair x2.  4. Status post appendectomy.  5. Additionally hemorrhoidectomy.  6. Mastectomy as described above.   MEDICATIONS:  Which she takes at home, these include:  1. Pravastatin 20 mg p.o. q.h.s.  2. She takes Alprazolam 0.5 mg p.o. q.i.d.  3. Nexium 40 mg daily.  4. Spiriva once inhaled daily.  5. Advair 50/250 inhaled b.i.d.   ALLERGIES:  NO KNOWN DRUG ALLERGIES.   REVIEW OF SYSTEMS:  She had some excessive daytime sleepiness.  She  apparently had some stool incontinence in her home.  Otherwise, no chest  pain.  She reports no fevers.  She has had swelling of her lower  extremities and abdomen for, at least, 3 weeks.  She states that she was  not experiencing this prior to that time.  Otherwise, she denies any  other specific complaints.   PHYSICAL EXAMINATION:  VITAL SIGNS:  In the emergency department, her  blood pressure was 161/83, temperature 97.7, heart rate 98, respirations  20, O2 saturation 95%.  HEENT:  Revealed her  head to be normocephalic, atraumatic.  Extraocular  muscles were intact.  NECK:  Supple.  Nontender.  CARDIOVASCULAR EXAM:  Revealed normal S1, S2, distant with positive JVD  7 to 8 cm.  LUNGS:  Reveal crackles bilaterally and diminished breath sounds at the  right base.  ABDOMEN:  Was obese, nontender.  She did have chronic edematous  appearance.  LOWER EXTREMITIES:  Revealed +3 to 4 bipedal pitting edema with chronic  stasis appearance.  NEUROLOGIC:  She was alert and oriented x3.   LABORATORY DATA:  Reveals a hemoglobin to be 13.7, platelet count  238,000, MCV of 91, sodium 140, potassium 3.2, chloride 100, CO2 37, BUN  7, creatinine 0.8, glucose 95.  Her venous pH was 7.389.  Her PCO2  venous was 58.  EKG revealed normal sinus rhythm.  Chest x-ray revealed  mild congestive heart failure and right pleural effusions with inability  to rule out loculation.   ASSESSMENT:  1. Congestive heart failure, new onset.  We will admit to determine       etiology.  2. Obesity.  3. Probable obesity with hypoventilatory syndrome/obstructive sleep      apnea.  4. Chronic obstructive pulmonary disease.  5. Hypokalemia.   PLAN:  At this time, Ms. Gauntt will be admitted to telemetry floor.  We will cycle cardiac markers.  Rule out an acute ischemic etiology.  Follow her clinical course and administer gentle IV diuresis, obtain a  2D echocardiogram to evaluate her left ventricular function and involve  cardiology for further evaluation of new onset congestive heart failure.      Hettie Holstein, D.O.  Electronically Signed     ESS/MEDQ  D:  08/05/2007  T:  08/05/2007  Job:  [id number]   cc:   Reuben Likes, M.D.

## 2011-01-15 NOTE — Consult Note (Signed)
Kaitlin Henderson, SHERROW             ACCOUNT NO.:  [id number]   MEDICAL RECORD NO.:  [id number]          PATIENT TYPE:  INP   LOCATION:  2921                         FACILITY:  [facility]   PHYSICIAN:  Clinton D. Maple Hudson, MD, FCCP, FACPDATE OF BIRTH:  [birth year] 02, 1941   DATE OF CONSULTATION:  08/08/2007  DATE OF DISCHARGE:                                 CONSULTATION   PROBLEM:  Pulmonary consultation requested by Dr. Olena Leatherwood for help with  COPD and hypercapnic respiratory failure.   HISTORY:  This is a 69 year old woman who had a very long history of  smoking, known COPD, but who denies having either home oxygen, or home  nebulizer machine.  She presented with 3-week history of progressive  edema, dyspnea, and lethargy.  Note is made that she had been scheduled  for a sleep study, but insurance would not cover it.  On arrival, chest  x-ray demonstrated congestive heart failure with pulmonary edema.  EKG  did not show ischemic changes.  There has not been a history of  hypertension or coronary artery disease.  Initial arterial blood gas  available from December 5, we think on 3-4 liters of oxygen showed pH of  7.25, pCO2 100, pO2 51, bicarbonate 43.  Repeated on December 5 on 50%  mask, pH was 7.42, pCO2 85, pO2 49, bicarbonate 55.  The E-link  physician noted that hypercarbia was acceptable if necessary to prevent  dangerous hypoxemia.  Treatment currently has been including  supplemental oxygen and Avelox, which is being changed, nebulized  Atrovent with Ventolin, and Advair Diskus, and Solu-Medrol.   REVIEW OF SYSTEMS:  She denies pain, nausea, or vomiting, and says leg  edema was new within the last 3 weeks and feels much better now.  She  considers her breathing lying at rest on oxygen to be comparable to the  way she usually feels at home.  She has not been ambulatory here.  She  has not had recent fever or bleeding.   PAST MEDICAL HISTORY:  Breast cancer in 1998, subsequently followed by  Dr. Iantha Fallen Carb through the [facility].  She had chemo and radiation  therapy.  Pulmonary impact of this is unknown. Hyperlipidemia, chronic  obstructive pulmonary disease, obesity, hysterectomy, cholecystectomy,  hernia repairs, appendectomy, hemorrhoidectomy, mastectomy for her  breast cancer therapy.   ALLERGIES:  No medication allergy.   HOME MEDICATIONS:  Therapies had included pravastatin 20 mg, alprazolam  0.5 mg q.i.d., Nexium 40 mg, Spiriva once daily, Advair 250/50 mg.   FAMILY HISTORY:  Her natural father has been dead a long time, and she  did not know his family history.  She is not aware of lung disease on  her mother's side.   SOCIAL HISTORY:  Not working since breast cancer.  Lives alone.  She  smoked steadily until quitting just a few months ago by her report.   OBJECTIVE:  VITAL SIGNS:  Pulse regular at 100, it appears to be sinus  by monitor.  BP 128/41.  Oxygen saturation 89% on 50% face mask.  GENERAL:  She is alert, cooperative, overweight, not obviously labored,  lying in bed with head elevated.  SKIN:  There are chronic stasis thickening changes in the lower  extremities.  No evident rash.  ADENOPATHY:  None found at the neck, supraclavicular areas or axilla.  HEENT:  Oral mucosa clear.  She can breathe through her nose with mouth  closed.  No stridor.  Short, thick neck.  I cannot tell if neck veins  are distended.  CHEST:  Air flow is generally poor.  There are faint crackles/ rales  diffusely.  No discrete dullness is evident.  I do not hear pleural rub,  I do not hear wheeze.  She is not using accessory muscles.  HEART:  Regular rhythm.  I do not hear murmur or gallop.  Sounds are  muffled.  Status post mastectomy.  ABDOMEN:  Obese, soft.  Bowel sounds are faint.  No evident tenderness.  EXTREMITIES:  Slightly dusky fingertips.  There is no edema at the  ankles, but thickened skin is wrinkled, consistent with decompression of  probably chronic ankle  edema.  No palpable cords.  Negative Homans.   IMPRESSION:  1. Chronic obstructive pulmonary disease exacerbation.  2. Acute on chronic respiratory failure.  3. Coronary disease is probable with congestive heart failure most      likely on basis of left ventricular failure.  Documentation is not      available.  4. History of breast cancer with mastectomy, chemotherapy and      radiation therapy.  I am not sure what the adjuvant therapies may      have done to lung function.  5. She has expressed unwillingness to use (hydrofluoroalkane) HFA-      metered inhalers because she says she cannot stand the taste and      does not like the way they work.   RECOMMENDATIONS:  1. Suggest reducing Solu-Medrol to 40 mg b.i.d. to reduce catabolism,      and then gradually change to prednisone for slow taper as      tolerated.  2. Suggest home nebulizer considering albuterol with ipratropium      q.i.d.  3. Consider using a Maxair Autohaler as a rescue inhaler at discharge.      This will continue to be available for at least the next year or      two with (chlorofluorocarbons) CFC formulation.  4. She will probably need home oxygen, and before she is discharged,      time should be spent on low-flow oxygen, probably at 1 liter per      minute to see how she equilibrates with this, and she will need      some education on oxygen therapy.  5. It will help her pulmonary status if she can be mobilized to      upright position and ambulation as allowed by her cardiac disease      and other medical problems.      Clinton D. Maple Hudson, MD, Tonny Bollman, FACP  Electronically Signed     CDY/MEDQ  D:  08/08/2007  T:  08/08/2007  Job:  [id number]   cc:   Ladell Pier, M.D.  Reuben Likes, M.D.

## 2011-01-15 NOTE — Procedures (Signed)
Kaitlin Henderson, Kaitlin Henderson             ACCOUNT NO.:  [id number]   MEDICAL RECORD NO.:  [id number]          PATIENT TYPE:  OUT   LOCATION:  SLEEP CENTER                 FACILITY:  [facility]   PHYSICIAN:  Oretha Milch, MD      DATE OF BIRTH:  September 06, 1940   DATE OF STUDY:  08/31/2007                            NOCTURNAL POLYSOMNOGRAM   REFERRING PHYSICIAN:  Charlcie Cradle. Delford Field, MD, [facility]   INITIAL DATA:  Kaitlin Henderson is a 70 year old Caucasian woman with BMI of  34.9, neck size 15.5 inches, with COPD on home oxygen.  Note that this  study was performed on 3 L oxygen.   HOME MEDICATIONS:  1. Aspirin.  2. Pravastatin.  3. Lasix.  4. Nexium.  5. Potassium.   This baseline polysomnogram was performed with a sleep technologist in  attendance.  EEG, EKG, EMG, EOG, and respiratory data were recorded.  Sleep stages, arousals, respiratory parameters, and limb movements  during sleep were scored according to criteria laid out by the American  Academy of Sleep Medicine.   SLEEP ARCHITECTURE:  Lights out was at 10:12 p.m.  Sleep latency was  15.5 minutes.  Total sleep time was 301.5 minutes (5 hours) with a sleep  period time of 394 minutes leading to a sleep efficiency of 73.5%.  Awake after sleep onset was 93.5 minutes and REM latency was 219  minutes.  Sleep stages as a percentage of total sleep time was N1, 3.6%,  N2, 84.1%, 0% N3, and REM 12.3% (37 minutes).  Supine sleep was noted  for 295.5 minutes.  REM sleep according to stages with the longest  period at 4 a.m.   RESPIRATORY DATA:  There were a total of 2 central apneas and 18  hypopneas leading to an apnea/hypopnea index of 4 events per hour.  Six  hypopneas were noted during REM sleep leading to a REM-related AHF 9.7  events per hour.  Only 1 RDRA was noted.  Almost all her sleep was  supine.   AROUSAL DATA:  There were a total of 20 arousals of which 11 were  spontaneous.  One was associated with hypopnea and 8 with limb movements  leading to an arousal index of 4 events per hour.   OXYGEN SATURATION DATA:  Note that the study was performed on 3 L of  oxygen.  There were a total of 22 desaturations, more than 4%, leading  to a desaturation index of 4.4 events per hour.  The lowest desaturation  was 84% noted during REM sleep.  She only spent 2.6 minutes with a  saturation less than 88%.   LIMB MOVEMENT DATA:  There were a total of 287 periodic limb movements  leading to a limb movement index of 57.1 events per hour.   CARDIAC DATA:  No significant arrhythmias were noted.   DISCUSSION:  REM onset was delayed.  No significant respiratory  disturbance or desaturation was noted at 3 L of oxygen.  Significant  periodic limb movements were observed.   IMPRESSION:  1. No evidence of sleep disordered breathing or significant sleep      fragmentation.  2. Oxygen saturation was well-maintained on  3 L of oxygen.  3. No significant cardiac arrhythmias.  4. Significant periodic limb movements were noted during sleep;      however, these were not associated with arousals.   RECOMMENDATIONS:  1. Three liters of oxygen during sleep does seem to be adequate.  2. The significance of limb movements not causing arousals is unclear      but does not warrant treatment.  Please correlate with a history      suggestive of restless leg syndrome (she does not report these      symptoms on her pre sleep questionnaire).  Consider check for iron-      deficiency anemia.      Oretha Milch, MD  Electronically Signed     RVA/MEDQ  D:  09/15/2007 14:46:34  T:  09/15/2007 15:26:57  Job:  [id number]

## 2011-01-15 NOTE — Discharge Summary (Signed)
Kaitlin Henderson, Kaitlin Henderson             ACCOUNT NO.:  [id number]   MEDICAL RECORD NO.:  [id number]          PATIENT TYPE:  INP   LOCATION:  2921                         FACILITY:  [facility]   PHYSICIAN:  Ladell Pier, M.D.   DATE OF BIRTH:  [birth year]   DATE OF ADMISSION:  08/05/2007  DATE OF DISCHARGE:                               DISCHARGE SUMMARY   DATE OF DISCHARGE:  To be determined.   DISCHARGE DIAGNOSES:  1. Hypercarbic hypoxic respiratory failure secondary to chronic      obstructive pulmonary disease.  2. Question of sleep apnea.  The patient to have outpatient sleep      study done.  3. Hypokalemia.  4. Congestive heart failure, most likely diastolic dysfunction with      normal ejection fraction.  5. Obesity.  6. Former tobacco use, quit x1 year.  7. Hyperlipidemia.  8. Breast cancer in 1998 status post chemotherapy as well as      radiation, oncologist in [place].  9. Hypothyroidism.  10.Status post hysterectomy.  11.Status post cholecystectomy.  12.Hernia repair x2.  13.Status post appendectomy.  14.Status post hemorrhoidectomy.  15.Mastectomy as described.   DISCHARGE MEDICATIONS:  To be determined.   CONSULTANTS:  Dr. Maple Hudson, pulmonary.   PROCEDURES:  None.   HISTORY OF PRESENT ILLNESS:  The patient is a 70 year old morbidly-obese  female, previous history of COPD, oxygen-dependence, who for the past 3  weeks now had been having progressive swelling and some shortness of  breath as well as daytime lethargy.  Was directed to undergo a sleep  study for the evaluation of sleep apnea in the outpatient setting.  However, due to insurance reasons she could not pursue this study.  In  the interim she has progressively worsened.  Primary care doctor, Dr.  Lorenz Coaster.  The patient was noted to have anasarca, chronic edematous  appearance of her lower extremities as well as her abdomen in the  emergency department.  She was admitted for further treatment of CHF and  further  workup.  She has no prior diagnosis of hypertension or heart  disease.   Past medical history, family, social history, medications, allergies,  review of systems per admission H&P.   PHYSICAL EXAMINATION:  Temperature 98.1, pulse 112, respirations 21,  blood pressure 130/65, pulse oximetry 95% on 1 L.  HEENT:  Normocephalic, atraumatic.  Pupils reactive to light.  Throat  without erythema.  CARDIOVASCULAR:  Regular rate and rhythm.  LUNGS:  Decreased breath sounds.  ABDOMEN:  Positive bowel sounds.  EXTREMITIES:  Without edema, just some erythema secondary to venous  stasis changes.   HOSPITAL COURSE:  #1 - ACUTE-ON-CHRONIC HYPERCARBIC RESPIRATORY FAILURE.  The patient was admitted to the hospital.  On the floor she developed  some respiratory difficulty with pCO2 of 101, elevated bicarb.  She was  transferred to the ICU where she was placed on a face mask.  Her  pulmonary function improved.  She was also in CHF.  Her chest x-ray  showed volume overload.  She was given high-dose Lasix.  She diuresed  over 10 L of fluid.  Her oxygenation  improved, her respiratory status  improved.  She was treated with Solu-Medrol, Rocephin and Zithromax.  She will be transferred to the floor and weaned off oxygen, and she may  need to go home with home oxygen.  Discussed this with the patient.  She  will try to get her sleep study done outpatient.   #2 - CHF.  She had a 2-D echocardiogram that showed normal pumping  function.  Will change her Lasix to p.o.   #3 - HYPOKALEMIA.  Her hypokalemia resolved with treatment.  Her  hypokalemia is most likely secondary to the Lasix.   #4 - HYPERGLYCEMIA.  Her blood sugar was mildly elevated.  That is most  likely secondary to the steroid therapy.  Will taper her off the steroid  over the next few days.  For now, will cover her with sliding scale  insulin while on steroid.   #5 - ATELECTASIS.  The patient given incentive spirometer at the bedside  to  help with her atelectasis.   DISCHARGE LABORATORIES:  BNP 177.  Sodium 142, potassium 3.9, chloride  90, CO2 40, glucose 202, BUN 20, creatinine 0.76.  CBC:  WBC 7.0,  hemoglobin 16.7, platelet 254.  TSH 2.775.  Chest x-ray showed CHF,  worsened bibasilar atelectasis, stable effusions.   DISPOSITION:  The patient could be discharged in 1-2 days if pulmonary  function improves.      Ladell Pier, M.D.  Electronically Signed     NJ/MEDQ  D:  08/10/2007  T:  08/10/2007  Job:  [id number]   cc:   Reuben Likes, M.D.

## 2011-01-21 ENCOUNTER — Other Ambulatory Visit: Payer: Self-pay | Admitting: Critical Care Medicine

## 2011-01-24 NOTE — Telephone Encounter (Signed)
Per EMR/Centricity, proair was sent on 11/14/10 # 1 x 6. Pharmacy request is requesting 2 inhalers/mo.  Called [facility], spoke with Arlys John to clarify this.  Per Arlys John, the refill they have is for proair 3-4 puffs every 4-6 hours with aerocamber but 1 inhaler will not last.  Pt will need 2 inhalers for these instructions so they are requesting a new rx for this.    Pt's med list in Epic is for 2 puffs every 6 hours but per phone note on 11/08/10 it was increased to 3-4 puffs every 4-6 hours prn.  ? If pt is taking this 3-4 puffs every 4-6 hours as needed.  ATC pt to clarify this but NA and unable to leave message - WCB.

## 2011-01-25 ENCOUNTER — Other Ambulatory Visit: Payer: Self-pay | Admitting: Critical Care Medicine

## 2011-01-25 ENCOUNTER — Other Ambulatory Visit: Payer: Self-pay | Admitting: Internal Medicine

## 2011-01-25 NOTE — Telephone Encounter (Signed)
Called, spoke with pt.  States her instructions are supposed to be 3-4 puffs not 2.  She is aware rx will be sent in this way and for 2 inhalers.  Pt verbalized understanding of this.

## 2011-02-25 ENCOUNTER — Telehealth: Payer: Self-pay | Admitting: Critical Care Medicine

## 2011-02-25 NOTE — Telephone Encounter (Signed)
Pt advised sample at front. Carron Curie, CMA

## 2011-03-15 ENCOUNTER — Other Ambulatory Visit: Payer: Self-pay | Admitting: Critical Care Medicine

## 2011-03-15 ENCOUNTER — Encounter: Payer: Self-pay | Admitting: *Deleted

## 2011-03-15 NOTE — Telephone Encounter (Signed)
This phone encounter was created by accident -- pls disregard.

## 2011-03-25 ENCOUNTER — Other Ambulatory Visit: Payer: Self-pay | Admitting: Critical Care Medicine

## 2011-03-27 ENCOUNTER — Ambulatory Visit (INDEPENDENT_AMBULATORY_CARE_PROVIDER_SITE_OTHER): Payer: Medicare Other | Admitting: Internal Medicine

## 2011-03-27 ENCOUNTER — Other Ambulatory Visit (INDEPENDENT_AMBULATORY_CARE_PROVIDER_SITE_OTHER): Payer: Medicare Other

## 2011-03-27 ENCOUNTER — Telehealth: Payer: Self-pay | Admitting: Critical Care Medicine

## 2011-03-27 ENCOUNTER — Encounter: Payer: Self-pay | Admitting: Critical Care Medicine

## 2011-03-27 ENCOUNTER — Encounter: Payer: Self-pay | Admitting: Internal Medicine

## 2011-03-27 ENCOUNTER — Ambulatory Visit (INDEPENDENT_AMBULATORY_CARE_PROVIDER_SITE_OTHER): Payer: Medicare Other | Admitting: Critical Care Medicine

## 2011-03-27 DIAGNOSIS — F329 Major depressive disorder, single episode, unspecified: Secondary | ICD-10-CM

## 2011-03-27 DIAGNOSIS — F3289 Other specified depressive episodes: Secondary | ICD-10-CM

## 2011-03-27 DIAGNOSIS — J449 Chronic obstructive pulmonary disease, unspecified: Secondary | ICD-10-CM

## 2011-03-27 DIAGNOSIS — E119 Type 2 diabetes mellitus without complications: Secondary | ICD-10-CM

## 2011-03-27 DIAGNOSIS — J4489 Other specified chronic obstructive pulmonary disease: Secondary | ICD-10-CM

## 2011-03-27 NOTE — Assessment & Plan Note (Addendum)
Kaitlin Henderson copd Plan No change in inhaled or maintenance medications. Return in   4 months

## 2011-03-27 NOTE — Assessment & Plan Note (Signed)
O2/steroid dep, follows with pulm and home hospice - symptoms stable - The current medical regimen is effective;  continue present plan and medications.

## 2011-03-27 NOTE — Patient Instructions (Signed)
It was good to see you today. Test(s) ordered today. Your results will be called to you after review (48-72hours after test completion). If any changes need to be made, you will be notified at that time. Medications reviewed, no changes at this time Let us know if you would like referral for counseling to help with grief symptoms  Please schedule followup in 3-4 months for diabetes, call sooner if problems.

## 2011-03-27 NOTE — Assessment & Plan Note (Signed)
The current medical regimen is effective;  continue present plan and medications.  Metformin daily with prn amaryl for cbg >150 - no change in steroid dose Home cbg 95-150s - reasonable control for state of other health issues Lab Results  Component Value Date   HGBA1C 6.8* 12/24/2010

## 2011-03-27 NOTE — Progress Notes (Signed)
  Subjective:    Patient ID: Kaitlin Henderson, female    DOB: Mar 15, 1941, 70 y.o.   MRN: [id number]  HPI here for follow up - reviewed chronic medical issues:  DM2 - dx 03/2010 by routine pulm labs started on metformin + OSA 03/2010 reports compliance with ongoing medical treatment and no changes in medication dose or frequency. denies adverse side effects related to current therapy. no hypoglycemic events or symptoms - checks sugars 2x/day - home cbg log reviewed - range 101-170s, lowest in AM   COPD - advanced dz, freq exac - follows with pulm and enrolled with hospice for same since 06/2009 - chronic pred + cont O2 use - daily DOE but no cough or flares at this time - reports compliance with ongoing medical treatment and no changes in medication dose or frequency. denies adverse side effects related to current therapy.   breast cancer - >37yr out - dx 1998 s/p chemo and xrt - s/p r mastect for same -   anxiety - largely related to trouble breathing and fear of being alone - "i have no one" since mom & spouse expired and children are "distantly involved" per her report - uses BZ as needed - increase depression symptoms and sadness - started SSRI 09/2010- but mom passed 01/2011 - very sad, not sleeping  Past Medical History  Diagnosis Date  . Breast cancer, right breast 1998    s/p chemo t XRT and right mastectomy  . OBESITY   . CORONARY ARTERY DISEASE 08/17/2007  . RESPIRATORY FAILURE 08/17/2007  . APHTHOUS STOMATITIS   . DEPRESSION   . DIABETES, TYPE 2 dx 03/2010  . HYPERLIPIDEMIA   . Hypothyroidism   . C O P D     chronic O2     Review of Systems  Constitutional: Positive for fatigue. Negative for fever.  Cardiovascular: Negative for chest pain.  Genitourinary: Negative for dysuria and frequency.  Neurological: Negative for headaches.       Objective:   Physical Exam  BP 128/72  Pulse 76  Temp(Src) 97.9 F (36.6 C) (Oral)  Ht 5\' 2"  (1.575 m)  Wt 208 lb 1.9 oz (94.403  kg)  BMI 38.07 kg/m2  SpO2 92% Constitutional: She is obese; oriented to person, place, and time. She appears well-developed and well-nourished. No distress.  Neck: Normal range of motion. Neck supple. No JVD present. No thyromegaly present.  Cardiovascular: Normal rate, regular rhythm and normal heart sounds.  No murmur heard. No BLE edema. Pulmonary/Chest: Effort normal and breath sounds diminished at bases. No respiratory distress. She has no wheezes.  Skin: Pale - Skin is warm and dry. No rash noted. No erythema.  Psychiatric: She has a depressed mood and occ tearful affect. Her behavior is normal. Judgment and thought content normal.      Lab Results  Component Value Date   WBC 8.5 03/13/2010   HGB 16.3* 03/13/2010   HCT 46.5* 03/13/2010   PLT 216.0 03/13/2010   CHOL 171 06/26/2010   TRIG 158.0* 06/26/2010   HDL 52.00 06/26/2010   ALT 23 09/25/2010   AST 22 09/25/2010   NA 140 12/24/2010   K 4.5 12/24/2010   CL 94* 12/24/2010   CREATININE 1.0 12/24/2010   BUN 18 12/24/2010   CO2 31 12/24/2010   TSH 0.64 06/26/2010   HGBA1C 6.8* 12/24/2010   Assessment & Plan:   See problem list. Medications and labs reviewed today.

## 2011-03-27 NOTE — Progress Notes (Signed)
Subjective:    Patient ID: Kaitlin Henderson, female    DOB: 1941/03/02, 70 y.o.   MRN: [id number]  HPI  70 y.o.WF copd 12/24/10 Occ mucus white like pearl .   Gets out thick mucus.  Dyspnea is the same. Mood better on antidepressant. No real chest pain.  The pt is now out of hospice program Pt denies any significant sore throat, nasal congestion or excess secretions, fever, chills, sweats, unintended weight loss, pleurtic or exertional chest pain, orthopnea PND, or leg swelling Pt denies any increase in rescue therapy over baseline, denies waking up needing it or having any early am or nocturnal exacerbations of coughing/wheezing/or dyspnea. Pt also denies any obvious fluctuation in symptoms with  weather or environmental change or other alleviating or aggravating factors  03/27/2011 No changes were made at last ov. Since Jan 04, 2023, mother passed away.  Dyspnea ok unless in the heat.  Still with white pearl like mucus. Pt denies any significant sore throat, nasal congestion or excess secretions, fever, chills, sweats, unintended weight loss, pleurtic or exertional chest pain, orthopnea PND, or leg swelling Pt denies any increase in rescue therapy over baseline, denies waking up needing it or having any early am or nocturnal exacerbations of coughing/wheezing/or dyspnea. Pt also denies any obvious fluctuation in symptoms with  weather or environmental change or other alleviating or aggravating factors    Past Medical History  Diagnosis Date  . Breast cancer, right breast 1998    s/p chemo t XRT and right mastectomy  . OBESITY   . CORONARY ARTERY DISEASE 08/17/2007  . RESPIRATORY FAILURE 08/17/2007  . APHTHOUS STOMATITIS   . DEPRESSION   . DIABETES, TYPE 2 dx 03/2010  . HYPERLIPIDEMIA   . Hypothyroidism   . C O P D     chronic O2     Family History  Problem Relation Age of Onset  . Diabetes Neg Hx   . Cancer Neg Hx      History   Social History  . Marital Status: Widowed   Spouse Name: N/A    Number of Children: N/A  . Years of Education: N/A   Occupational History  . Not on file.   Social History Main Topics  . Smoking status: Former Smoker -- 2.5 packs/day for 54 years    Types: Cigarettes    Quit date: 09/02/2006  . Smokeless tobacco: Never Used   Comment: Retired. Widow/widower since 2008-lives alone. Home hospice since 06/2009 related to COPD  . Alcohol Use: No  . Drug Use: No  . Sexually Active: Not on file   Other Topics Concern  . Not on file   Social History Narrative  . No narrative on file     Allergies  Allergen Reactions  . Codeine     REACTION: makes pt pass out  . Sulfonamide Derivatives     REACTION: edema     Outpatient Prescriptions Prior to Visit  Medication Sig Dispense Refill  . ADVAIR DISKUS 500-50 MCG/DOSE AEPB INHALE 1 PUFF TWICE A DAY  1 each  12  . albuterol (PROAIR HFA) 108 (90 BASE) MCG/ACT inhaler 3-4 puffs every 4-6 hours as needed with aerochamber  2 Inhaler  5  . albuterol (PROVENTIL) (2.5 MG/3ML) 0.083% nebulizer solution Take 2.5 mg by nebulization 4 (four) times daily as needed.        . ALPRAZolam (XANAX) 0.5 MG tablet Take 1 tablet (0.5 mg total) by mouth every 8 (eight) hours as needed.  30 tablet  5  . Ascorbic Acid (VITAMIN C) 500 MG tablet Take 500 mg by mouth daily.        [name] aspirin 81 MG tablet Take 81 mg by mouth daily.        . benzonatate (TESSALON) 100 MG capsule Take 100 mg by mouth every 8 (eight) hours as needed.       . furosemide (LASIX) 40 MG tablet TAKE 1 TABLET TWICE DAILY  60 tablet  5  . glimepiride (AMARYL) 2 MG tablet Take 1/2 by mouth every morning if cbg > 150      . Glucose Blood (ONETOUCH ULTRA BLUE VI) by In Vitro route. Use two times a day to check blood sugars       . metFORMIN (GLUCOPHAGE-XR) 500 MG 24 hr tablet Take 500 mg by mouth 2 (two) times daily.        . Multiple Vitamins-Minerals (CENTRUM SILVER) tablet Take 1 tablet by mouth daily.        . Omega-3 Fatty Acids  (FISH OIL) 1200 MG CAPS Take by mouth 2 (two) times daily.        . ONE TOUCH LANCETS MISC by Does not apply route. Use two times a day to check blood sugars        . ONE TOUCH ULTRA TEST test strip CHECK BLOOD SUGAR TWO TIMES A DAY DX: 250.00  50 each  3  . potassium chloride SA (K-DUR,KLOR-CON) 20 MEQ tablet Take 20 mEq by mouth 2 (two) times daily.   60 tablet  2  . pravastatin (PRAVACHOL) 40 MG tablet Take 40 mg by mouth daily.        . predniSONE (DELTASONE) 10 MG tablet Take 10 mg by mouth daily.        . sennosides-docusate sodium (SENOKOT-S) 8.6-50 MG tablet Take 1 tablet by mouth at bedtime as needed.        . tiotropium (SPIRIVA) 18 MCG inhalation capsule Place 18 mcg into inhaler and inhale daily.        . vitamin E 400 UNIT capsule Take 400 Units by mouth daily.        Docia Barrier IN Inhale into the lungs. Use 3ml continuous       . esomeprazole (NEXIUM) 40 MG capsule Take 40 mg by mouth daily before breakfast.        . sertraline (ZOLOFT) 25 MG tablet Take 25 mg by mouth daily.        [name] ibuprofen (ADVIL,MOTRIN) 200 MG tablet Take 200 mg by mouth every 6 (six) hours as needed.           Review of Systems  Constitutional:   No  weight loss, night sweats,  Fevers, chills, fatigue, lassitude. HEENT:   No headaches,  Difficulty swallowing,  Tooth/dental problems,  Sore throat,                No sneezing, itching, ear ache, nasal congestion, post nasal drip,   CV:  No chest pain,  Orthopnea, PND, swelling in lower extremities, anasarca, dizziness, palpitations  GI  No heartburn, indigestion, abdominal pain, nausea, vomiting, diarrhea, change in bowel habits, loss of appetite  Resp: Notes  shortness of breath with exertion not  at rest.  No excess mucus, no productive cough,  No non-productive cough,  No coughing up of blood.  No change in color of mucus.  No wheezing.  No chest wall deformity  Skin: no rash or lesions.  GU: no dysuria, change  in color of urine, no urgency or  frequency.  No flank pain.  MS:  No joint pain or swelling.  No decreased range of motion.  No back pain.  Psych:  No change in mood or affect. No depression or anxiety.  No memory loss. t    Objective:   Physical Exam  Filed Vitals:   03/27/11 1112 03/27/11 1113  BP: 160/80 160/80  Pulse:  80  Temp:  97.9 F (36.6 C)  TempSrc:  Oral  Height:  5\' 2"  (1.575 m)  Weight: 208 lb 12.8 oz (94.711 kg) 208 lb 12.8 oz (94.711 kg)  SpO2:  95%    Gen: Pleasant, well-nourished, in no distress,  normal affect  ENT: No lesions,  mouth clear,  oropharynx clear, no postnasal drip  Neck: No JVD, no TMG, no carotid bruits  Lungs: No use of accessory muscles, no dullness to percussion, distant BS   Cardiovascular: RRR, heart sounds normal, no murmur or gallops, no peripheral edema  Abdomen: soft and NT, no HSM,  BS normal  Musculoskeletal: No deformities, no cyanosis or clubbing  Neuro: alert, non focal  Skin: Warm, no lesions or rashes   PFT Conversion 12/06/2008  FVC 1.95  FVC PREDICT 2.76  FVC  % Predicted 71  FEV1 1.01  FEV1 PREDICT 1.97  FEV % Predicted 51  FEV1/FVC 51.8  FEV1/FVC PRE 72  FEV1/FVC%EXP   FeF 25-75 0.31  FeF 25-75 % Predicted 2.29  FEF % EXPEC 13  POST FVC 2.08  FVCPRDPST 2.76  POST FVC%EXP 75  POST FEV1 1.16  FEV1PRDPST 1.97  POSTFEV1%PRD 59  PSTFEV1/FVC 56  FEV1FVCPRDPS 72  PSTFEF25/75% 0.46  PSTFEF25/75P 2.29  FEF2575%EXPS 20  Residual Volume (ml) 2.86  RV % EXPECT 156  DLCO (ml/mmHg sec) 12.8  DLCO % EXPEC 53  DLCO/VA 4.35  DLCO/VA%EXP 122  PFT RSLT severe obstruction with significant bronchodilator response   PFT Conversion 09/25/2010  FVC   FVC PREDICT 2.755  FVC  % Predicted 54.77  FEV1   FEV1 PREDICT 2.081  FEV % Predicted 42.52  FEV1/FVC   FEV1/FVC PRE 75.934  FEV1/FVC%EXP 77.220  FeF 25-75   FeF 25-75 % Predicted   FEF % EXPEC 27.860  POST FVC   FVCPRDPST   POST FVC%EXP   POST FEV1   FEV1PRDPST   POSTFEV1%PRD     PSTFEV1/FVC   FEV1FVCPRDPS   PSTFEF25/75%   PSTFEF25/75P   FEF2575%EXPS   Residual Volume (ml)   RV % EXPECT   DLCO (ml/mmHg sec)   DLCO % EXPEC   DLCO/VA   DLCO/VA%EXP   PFT RSLT    Assessment & Plan:   C O P D Stabel Gold III copd Plan No change in inhaled or maintenance medications. Return in   4 months     Updated Medication List Outpatient Encounter Prescriptions as of 03/27/2011  Medication Sig Dispense Refill  . ADVAIR DISKUS 500-50 MCG/DOSE AEPB INHALE 1 PUFF TWICE A DAY  1 each  12  . albuterol (PROAIR HFA) 108 (90 BASE) MCG/ACT inhaler 3-4 puffs every 4-6 hours as needed with aerochamber  2 Inhaler  5  . albuterol (PROVENTIL) (2.5 MG/3ML) 0.083% nebulizer solution Take 2.5 mg by nebulization 4 (four) times daily as needed.        . ALPRAZolam (XANAX) 0.5 MG tablet Take 1 tablet (0.5 mg total) by mouth every 8 (eight) hours as needed.  30 tablet  5  . Ascorbic Acid (VITAMIN C) 500 MG tablet  Take 500 mg by mouth daily.        [name] aspirin 81 MG tablet Take 81 mg by mouth daily.        . benzonatate (TESSALON) 100 MG capsule Take 100 mg by mouth every 8 (eight) hours as needed.       . furosemide (LASIX) 40 MG tablet TAKE 1 TABLET TWICE DAILY  60 tablet  5  . glimepiride (AMARYL) 2 MG tablet Take 1/2 by mouth every morning if cbg > 150      . Glucose Blood (ONETOUCH ULTRA BLUE VI) by In Vitro route. Use two times a day to check blood sugars       . metFORMIN (GLUCOPHAGE-XR) 500 MG 24 hr tablet Take 500 mg by mouth 2 (two) times daily.        . Multiple Vitamins-Minerals (CENTRUM SILVER) tablet Take 1 tablet by mouth daily.        . Omega-3 Fatty Acids (FISH OIL) 1200 MG CAPS Take by mouth 2 (two) times daily.        . ONE TOUCH LANCETS MISC by Does not apply route. Use two times a day to check blood sugars        . ONE TOUCH ULTRA TEST test strip CHECK BLOOD SUGAR TWO TIMES A DAY DX: 250.00  50 each  3  . potassium chloride SA (K-DUR,KLOR-CON) 20 MEQ tablet Take 20 mEq  by mouth 2 (two) times daily.   60 tablet  2  . pravastatin (PRAVACHOL) 40 MG tablet Take 40 mg by mouth daily.        . predniSONE (DELTASONE) 10 MG tablet Take 10 mg by mouth daily.        . sennosides-docusate sodium (SENOKOT-S) 8.6-50 MG tablet Take 1 tablet by mouth at bedtime as needed.        . tiotropium (SPIRIVA) 18 MCG inhalation capsule Place 18 mcg into inhaler and inhale daily.        . vitamin E 400 UNIT capsule Take 400 Units by mouth daily.        [name] DISCONTD: OXYGEN-HELIUM IN Inhale into the lungs. Use 3ml continuous       . esomeprazole (NEXIUM) 40 MG capsule Take 40 mg by mouth daily before breakfast.        . sertraline (ZOLOFT) 25 MG tablet Take 25 mg by mouth daily.        [name] DISCONTD: ibuprofen (ADVIL,MOTRIN) 200 MG tablet Take 200 mg by mouth every 6 (six) hours as needed.

## 2011-03-27 NOTE — Patient Instructions (Signed)
No change in medications. Return in        4 months,  Samples of Advair/spiriva given

## 2011-03-27 NOTE — Assessment & Plan Note (Signed)
Was improved with addition of SSRI Sep 21, 2010 - but worse following death of mom 01/20/11 - grief revieewed, support offered The current medical regimen is effective with prn BZs;  continue present plan and medications.  Offered but pt declines counseling

## 2011-03-27 NOTE — Telephone Encounter (Signed)
Spoke with pt. She states that she was looking over her med list on her instruction paper, and noticed that it said she takes K once daily, when really she takes this bid. I asked if the nurse verified the directions of med at the time of the visit, and she stated no, that she just asked if she was still taking K. I advised that this is why it was not corrected on her list and that she should continue to take med as she was before, since PW did not mention changing this. Pt verbalized understanding and I updated her list to be correct.

## 2011-04-01 ENCOUNTER — Telehealth: Payer: Self-pay | Admitting: Critical Care Medicine

## 2011-04-01 MED ORDER — POTASSIUM CHLORIDE CRYS ER 20 MEQ PO TBCR: 20.0000 meq | EXTENDED_RELEASE_TABLET | Freq: Two times a day (BID) | ORAL | Status: DC

## 2011-04-01 NOTE — Telephone Encounter (Signed)
Med sent to pharmacy--na at pt's home

## 2011-04-13 ENCOUNTER — Other Ambulatory Visit: Payer: Self-pay | Admitting: Internal Medicine

## 2011-05-21 ENCOUNTER — Telehealth: Payer: Self-pay | Admitting: Critical Care Medicine

## 2011-05-21 NOTE — Telephone Encounter (Signed)
Pt was requesting samples of advair 500. I advised that we have none at this time.

## 2011-06-04 ENCOUNTER — Other Ambulatory Visit: Payer: Self-pay | Admitting: Internal Medicine

## 2011-06-04 ENCOUNTER — Other Ambulatory Visit: Payer: Self-pay | Admitting: Critical Care Medicine

## 2011-06-05 ENCOUNTER — Other Ambulatory Visit: Payer: Self-pay | Admitting: Internal Medicine

## 2011-06-06 ENCOUNTER — Telehealth: Payer: Self-pay | Admitting: Critical Care Medicine

## 2011-06-06 NOTE — Telephone Encounter (Signed)
Called and spoke with pt. Pt requests samples of Ventolin, Spiriva, and Advair.  Informed pt no samples avail of rescue hfa but did leave samples of spiriva and advair.  Pt aware.

## 2011-06-10 LAB — B-NATRIURETIC PEPTIDE (CONVERTED LAB)
Pro B Natriuretic peptide (BNP): 177 — ABNORMAL HIGH
Pro B Natriuretic peptide (BNP): 181 — ABNORMAL HIGH
Pro B Natriuretic peptide (BNP): 237 — ABNORMAL HIGH
Pro B Natriuretic peptide (BNP): 297 — ABNORMAL HIGH
Pro B Natriuretic peptide (BNP): 327 — ABNORMAL HIGH
Pro B Natriuretic peptide (BNP): 48

## 2011-06-10 LAB — URINALYSIS, ROUTINE W REFLEX MICROSCOPIC
Glucose, UA: NEGATIVE
Glucose, UA: NEGATIVE
Ketones, ur: 15 — AB
Leukocytes, UA: NEGATIVE
Nitrite: NEGATIVE
Protein, ur: NEGATIVE
Specific Gravity, Urine: 1.017
pH: 7

## 2011-06-10 LAB — BASIC METABOLIC PANEL
BUN: 4 — ABNORMAL LOW
BUN: 6
CO2: 41 — ABNORMAL HIGH
CO2: 42 — ABNORMAL HIGH
Calcium: 8.5
Calcium: 8.8
Calcium: 8.9
Calcium: 9
Chloride: 90 — ABNORMAL LOW
Chloride: 91 — ABNORMAL LOW
Chloride: 92 — ABNORMAL LOW
Chloride: 95 — ABNORMAL LOW
Creatinine, Ser: 0.64
Creatinine, Ser: 0.68
Creatinine, Ser: 0.73
Creatinine, Ser: 0.75
Creatinine, Ser: 0.76
Creatinine, Ser: 0.89
GFR calc Af Amer: >60
GFR calc Af Amer: >60
GFR calc Af Amer: >60
GFR calc Af Amer: >60
GFR calc Af Amer: >60
GFR calc Af Amer: >60
GFR calc Af Amer: >60
GFR calc non Af Amer: >60
GFR calc non Af Amer: >60
GFR calc non Af Amer: >60
Glucose, Bld: 120 — ABNORMAL HIGH
Potassium: 3.2 — ABNORMAL LOW
Potassium: 3.5
Potassium: 3.8
Sodium: 141
Sodium: 142
Sodium: 143
Sodium: 144

## 2011-06-10 LAB — TSH
TSH: 2.381
TSH: 2.775

## 2011-06-10 LAB — DIFFERENTIAL
Basophils Absolute: 0
Eosinophils Relative: 1
Lymphocytes Relative: 29
Lymphs Abs: 1.7
Neutro Abs: 3.4

## 2011-06-10 LAB — BLOOD GAS, ARTERIAL
Acid-Base Excess: 16.4 — ABNORMAL HIGH
Bicarbonate: 43.6 — ABNORMAL HIGH
Delivery systems: POSITIVE
Mode: POSITIVE
O2 Content: 3
Patient temperature: 98.6
TCO2: 46.5
pCO2 arterial: 101
pH, Arterial: 7.254 — ABNORMAL LOW
pO2, Arterial: 51.6 — ABNORMAL LOW

## 2011-06-10 LAB — POCT I-STAT 3, ART BLOOD GAS (G3+)
Operator id: 196291
Patient temperature: 98.4
TCO2: >50
pCO2 arterial: 85.1
pH, Arterial: 7.421 — ABNORMAL HIGH

## 2011-06-10 LAB — CBC
HCT: 48.3 — ABNORMAL HIGH
Hemoglobin: 16.3 — ABNORMAL HIGH
MCHC: 31.6
MCV: 93.1
MCV: 93.5
MCV: 93.9
Platelets: 238
Platelets: 245
Platelets: 265
RBC: 4.98
RBC: 5.41 — ABNORMAL HIGH
RBC: 5.54 — ABNORMAL HIGH
RBC: 5.69 — ABNORMAL HIGH
RDW: 17.4 — ABNORMAL HIGH
WBC: 5.5
WBC: 5.9
WBC: 6.2
WBC: 7
WBC: 7.6

## 2011-06-10 LAB — I-STAT 8, (EC8 V) (CONVERTED LAB)
BUN: 7
Bicarbonate: 35.3 — ABNORMAL HIGH
Glucose, Bld: 95
Hemoglobin: 18.4 — ABNORMAL HIGH
pCO2, Ven: 58.5 — ABNORMAL HIGH

## 2011-06-10 LAB — HEPATIC FUNCTION PANEL
ALT: 10
AST: 21
Albumin: 2.8 — ABNORMAL LOW
Alkaline Phosphatase: 64
Total Protein: 5.7 — ABNORMAL LOW

## 2011-06-10 LAB — CARDIAC PANEL(CRET KIN+CKTOT+MB+TROPI)
CK, MB: 5.7 — ABNORMAL HIGH
CK, MB: 5.8 — ABNORMAL HIGH
CK, MB: 7 — ABNORMAL HIGH
Relative Index: INVALID
Relative Index: INVALID
Total CK: 65
Troponin I: 0.04

## 2011-06-10 LAB — POCT CARDIAC MARKERS
Myoglobin, poc: 70.9
Operator id: 146091

## 2011-06-10 LAB — URINE MICROSCOPIC-ADD ON

## 2011-06-18 ENCOUNTER — Other Ambulatory Visit: Payer: Self-pay | Admitting: *Deleted

## 2011-06-18 DIAGNOSIS — F419 Anxiety disorder, unspecified: Secondary | ICD-10-CM

## 2011-06-18 MED ORDER — ALPRAZOLAM 0.5 MG PO TABS: 0.5000 mg | ORAL_TABLET | Freq: Three times a day (TID) | ORAL | Status: DC | PRN

## 2011-06-18 NOTE — Telephone Encounter (Signed)
Done hardcopy to dahlia/LIM B  

## 2011-06-18 NOTE — Telephone Encounter (Signed)
Rx faxed to [facility].  

## 2011-06-18 NOTE — Telephone Encounter (Signed)
R'cd fax from [facility] for refill of Alprazolam-last written 12/25/2010 #30 with 5 refills-please advise in Dr. Diamantina Monks absence

## 2011-06-30 ENCOUNTER — Other Ambulatory Visit: Payer: Self-pay | Admitting: Critical Care Medicine

## 2011-07-04 ENCOUNTER — Other Ambulatory Visit: Payer: Self-pay | Admitting: Critical Care Medicine

## 2011-07-29 ENCOUNTER — Encounter: Payer: Self-pay | Admitting: Critical Care Medicine

## 2011-07-29 ENCOUNTER — Encounter: Payer: Self-pay | Admitting: Internal Medicine

## 2011-07-29 ENCOUNTER — Ambulatory Visit (INDEPENDENT_AMBULATORY_CARE_PROVIDER_SITE_OTHER): Payer: Medicare Other | Admitting: Critical Care Medicine

## 2011-07-29 ENCOUNTER — Encounter: Payer: Self-pay | Admitting: Neurology

## 2011-07-29 ENCOUNTER — Ambulatory Visit (INDEPENDENT_AMBULATORY_CARE_PROVIDER_SITE_OTHER): Payer: Medicare Other | Admitting: Internal Medicine

## 2011-07-29 ENCOUNTER — Other Ambulatory Visit (INDEPENDENT_AMBULATORY_CARE_PROVIDER_SITE_OTHER): Payer: Medicare Other

## 2011-07-29 VITALS — BP 122/72 | HR 85 | Temp 97.7°F | Ht 62.0 in | Wt 205.0 lb

## 2011-07-29 DIAGNOSIS — R259 Unspecified abnormal involuntary movements: Secondary | ICD-10-CM

## 2011-07-29 DIAGNOSIS — G252 Other specified forms of tremor: Secondary | ICD-10-CM

## 2011-07-29 DIAGNOSIS — E039 Hypothyroidism, unspecified: Secondary | ICD-10-CM

## 2011-07-29 DIAGNOSIS — J449 Chronic obstructive pulmonary disease, unspecified: Secondary | ICD-10-CM

## 2011-07-29 DIAGNOSIS — E119 Type 2 diabetes mellitus without complications: Secondary | ICD-10-CM

## 2011-07-29 LAB — TSH: TSH: 0.6 u[IU]/mL (ref 0.35–5.50)

## 2011-07-29 LAB — HEMOGLOBIN A1C: Hgb A1c MFr Bld: 6.6 % — ABNORMAL HIGH (ref 4.6–6.5)

## 2011-07-29 MED ORDER — BUDESONIDE-FORMOTEROL FUMARATE 160-4.5 MCG/ACT IN AERO: 2.0000 | INHALATION_SPRAY | Freq: Two times a day (BID) | RESPIRATORY_TRACT | Status: DC

## 2011-07-29 NOTE — Assessment & Plan Note (Signed)
The current medical regimen is effective;  continue present plan and medications.  Metformin daily with prn amaryl for cbg >150 - no change in steroid dose Home cbg 95-150s - reasonable control for state of other health issues Lab Results  Component Value Date   HGBA1C 7.0* 03/27/2011

## 2011-07-29 NOTE — Patient Instructions (Signed)
Stop advair Start Symbicort two puff twice daily, use samples Resume spiriva with samples Return 3 months

## 2011-07-29 NOTE — Progress Notes (Signed)
Subjective:    Patient ID: Kaitlin Henderson, female    DOB: 1941/04/03, 70 y.o.   MRN: [id number]  HPI  48 y.o.WF copd  07/30/2011 Now on girlfriends symbicort.    Dyspnea not as good off meds. Proair not as good.   No real chest pain.  Still thick pearl like mucus. Not able to afford meds. May have to change to lesser expensive Neb Meds. Pt denies any significant sore throat, nasal congestion or excess secretions, fever, chills, sweats, unintended weight loss, pleurtic or exertional chest pain, orthopnea PND, or leg swelling Pt denies any increase in rescue therapy over baseline, denies waking up needing it or having any early am or nocturnal exacerbations of coughing/wheezing/or dyspnea. Pt also denies any obvious fluctuation in symptoms with  weather or environmental change or other alleviating or aggravating factors     Past Medical History  Diagnosis Date  . Breast cancer, right breast 1998    s/p chemo & XRT, right mastectomy  . OBESITY   . CORONARY ARTERY DISEASE   . APHTHOUS STOMATITIS   . DEPRESSION     started sertraline 09/2010  . DIABETES, TYPE 2 dx 03/2010  . HYPERLIPIDEMIA   . Hypothyroidism   . C O P D     chronic O2     Family History  Problem Relation Age of Onset  . Diabetes Neg Hx   . Cancer Neg Hx      History   Social History  . Marital Status: Widowed    Spouse Name: N/A    Number of Children: N/A  . Years of Education: N/A   Occupational History  . Not on file.   Social History Main Topics  . Smoking status: Former Smoker -- 2.5 packs/day for 54 years    Types: Cigarettes    Quit date: 09/02/2006  . Smokeless tobacco: Never Used   Comment: Retired. Widow/widower since 2008-lives alone. Home hospice since 06/2009 related to COPD  . Alcohol Use: No  . Drug Use: No  . Sexually Active: Not on file   Other Topics Concern  . Not on file   Social History Narrative  . No narrative on file     Allergies  Allergen Reactions  . Codeine      REACTION: makes pt pass out  . Sulfonamide Derivatives     REACTION: edema     Outpatient Prescriptions Prior to Visit  Medication Sig Dispense Refill  . ALPRAZolam (XANAX) 0.5 MG tablet Take 1 tablet (0.5 mg total) by mouth every 8 (eight) hours as needed.  30 tablet  5  . Ascorbic Acid (VITAMIN C) 500 MG tablet Take 500 mg by mouth daily.        [name] aspirin 81 MG tablet Take 81 mg by mouth daily.        . benzonatate (TESSALON) 100 MG capsule Take 100 mg by mouth every 8 (eight) hours as needed.       [name] esomeprazole (NEXIUM) 40 MG capsule Take 40 mg by mouth daily before breakfast.        . furosemide (LASIX) 40 MG tablet TAKE 1 TABLET TWICE DAILY  60 tablet  5  . glimepiride (AMARYL) 2 MG tablet Take 1/2 by mouth every morning if cbg > 150      . Glucose Blood (ONETOUCH ULTRA BLUE VI) by In Vitro route. Use two times a day to check blood sugars       . KLOR-CON M20 20 MEQ  tablet TAKE 1 TABLET (20 MEQ TOTAL) BY MOUTH 2 (TWO) TIMES DAILY.  60 tablet  1  . metFORMIN (GLUCOPHAGE-XR) 500 MG 24 hr tablet TAKE 1 TABLET BY MOUTH TWICE A DAY  60 tablet  5  . Multiple Vitamins-Minerals (CENTRUM SILVER) tablet Take 1 tablet by mouth daily.        . Omega-3 Fatty Acids (FISH OIL) 1200 MG CAPS Take by mouth 2 (two) times daily.        . ONE TOUCH LANCETS MISC by Does not apply route. Use two times a day to check blood sugars        . ONE TOUCH ULTRA TEST test strip CHECK BLOOD SUGAR TWO TIMES A DAY DX: 250.00  50 each  3  . pravastatin (PRAVACHOL) 40 MG tablet TAKE 1 TABLET BY MOUTH ONCE DAILY  30 tablet  5  . predniSONE (DELTASONE) 10 MG tablet Take 10 mg by mouth daily.        [name] PROAIR HFA 108 (90 BASE) MCG/ACT inhaler INHALE 3-4 PUFFS EVERY 4-6 HOURS AS NEEDED WITH AEROCHAMBER  1 Inhaler  2  . sennosides-docusate sodium (SENOKOT-S) 8.6-50 MG tablet Take 1 tablet by mouth at bedtime as needed.        . vitamin E 400 UNIT capsule Take 400 Units by mouth 2 (two) times daily.       [name] albuterol  (PROVENTIL) (2.5 MG/3ML) 0.083% nebulizer solution Take 2.5 mg by nebulization 4 (four) times daily as needed.        . sertraline (ZOLOFT) 25 MG tablet Take 25 mg by mouth daily.        [name] tiotropium (SPIRIVA) 18 MCG inhalation capsule Place 18 mcg into inhaler and inhale daily.        [name] ADVAIR DISKUS 500-50 MCG/DOSE AEPB INHALE 1 PUFF TWICE A DAY  1 each  12     Review of Systems  Constitutional:   No  weight loss, night sweats,  Fevers, chills, fatigue, lassitude. HEENT:   No headaches,  Difficulty swallowing,  Tooth/dental problems,  Sore throat,                No sneezing, itching, ear ache, nasal congestion, post nasal drip,   CV:  No chest pain,  Orthopnea, PND, swelling in lower extremities, anasarca, dizziness, palpitations  GI  No heartburn, indigestion, abdominal pain, nausea, vomiting, diarrhea, change in bowel habits, loss of appetite  Resp: Notes  shortness of breath with exertion not  at rest.  No excess mucus, no productive cough,  No non-productive cough,  No coughing up of blood.  No change in color of mucus.  No wheezing.  No chest wall deformity  Skin: no rash or lesions.  GU: no dysuria, change in color of urine, no urgency or frequency.  No flank pain.  MS:  No joint pain or swelling.  No decreased range of motion.  No back pain.  Psych:  No change in mood or affect. No depression or anxiety.  No memory loss. t    Objective:   Physical Exam  Filed Vitals:   07/29/11 1415  BP: 122/72  Pulse: 85  Temp: 97.7 F (36.5 C)  TempSrc: Oral  Height: 5\' 2"  (1.575 m)  Weight: 205 lb (92.987 kg)  SpO2: 97%    Gen: Pleasant, well-nourished, in no distress,  normal affect  ENT: No lesions,  mouth clear,  oropharynx clear, no postnasal drip  Neck: No JVD, no TMG, no carotid  bruits  Lungs: No use of accessory muscles, no dullness to percussion, distant BS   Cardiovascular: RRR, heart sounds normal, no murmur or gallops, no peripheral edema  Abdomen: soft and  NT, no HSM,  BS normal  Musculoskeletal: No deformities, no cyanosis or clubbing  Neuro: alert, non focal  Skin: Warm, no lesions or rashes   PFT Conversion 12/06/2008  FVC 1.95  FVC PREDICT 2.76  FVC  % Predicted 71  FEV1 1.01  FEV1 PREDICT 1.97  FEV % Predicted 51  FEV1/FVC 51.8  FEV1/FVC PRE 72  FEV1/FVC%EXP   FeF 25-75 0.31  FeF 25-75 % Predicted 2.29  FEF % EXPEC 13  POST FVC 2.08  FVCPRDPST 2.76  POST FVC%EXP 75  POST FEV1 1.16  FEV1PRDPST 1.97  POSTFEV1%PRD 59  PSTFEV1/FVC 56  FEV1FVCPRDPS 72  PSTFEF25/75% 0.46  PSTFEF25/75P 2.29  FEF2575%EXPS 20  Residual Volume (ml) 2.86  RV % EXPECT 156  DLCO (ml/mmHg sec) 12.8  DLCO % EXPEC 53  DLCO/VA 4.35  DLCO/VA%EXP 122  PFT RSLT severe obstruction with significant bronchodilator response   PFT Conversion 09/25/2010  FVC   FVC PREDICT 2.755  FVC  % Predicted 54.77  FEV1   FEV1 PREDICT 2.081  FEV % Predicted 42.52  FEV1/FVC   FEV1/FVC PRE 75.934  FEV1/FVC%EXP 77.220  FeF 25-75   FeF 25-75 % Predicted   FEF % EXPEC 27.860  POST FVC   FVCPRDPST   POST FVC%EXP   POST FEV1   FEV1PRDPST   POSTFEV1%PRD   PSTFEV1/FVC   FEV1FVCPRDPS   PSTFEF25/75%   PSTFEF25/75P   FEF2575%EXPS   Residual Volume (ml)   RV % EXPECT   DLCO (ml/mmHg sec)   DLCO % EXPEC   DLCO/VA   DLCO/VA%EXP   PFT RSLT    Assessment & Plan:   C O P D Stabel Gold III copd Plan Change advair to symbicort 160 two puff bid, to better access samples Samples of spiriva given  Return in   4 months       Updated Medication List Outpatient Encounter Prescriptions as of 07/29/2011  Medication Sig Dispense Refill  . ALPRAZolam (XANAX) 0.5 MG tablet Take 1 tablet (0.5 mg total) by mouth every 8 (eight) hours as needed.  30 tablet  5  . Ascorbic Acid (VITAMIN C) 500 MG tablet Take 500 mg by mouth daily.        [name] aspirin 81 MG tablet Take 81 mg by mouth daily.        . benzonatate (TESSALON) 100 MG capsule Take 100 mg by mouth every 8  (eight) hours as needed.       [name] esomeprazole (NEXIUM) 40 MG capsule Take 40 mg by mouth daily before breakfast.        . furosemide (LASIX) 40 MG tablet TAKE 1 TABLET TWICE DAILY  60 tablet  5  . glimepiride (AMARYL) 2 MG tablet Take 1/2 by mouth every morning if cbg > 150      . Glucose Blood (ONETOUCH ULTRA BLUE VI) by In Vitro route. Use two times a day to check blood sugars       . KLOR-CON M20 20 MEQ tablet TAKE 1 TABLET (20 MEQ TOTAL) BY MOUTH 2 (TWO) TIMES DAILY.  60 tablet  1  . metFORMIN (GLUCOPHAGE-XR) 500 MG 24 hr tablet TAKE 1 TABLET BY MOUTH TWICE A DAY  60 tablet  5  . Multiple Vitamins-Minerals (CENTRUM SILVER) tablet Take 1 tablet by mouth daily.        [name]  Omega-3 Fatty Acids (FISH OIL) 1200 MG CAPS Take by mouth 2 (two) times daily.        . ONE TOUCH LANCETS MISC by Does not apply route. Use two times a day to check blood sugars        . ONE TOUCH ULTRA TEST test strip CHECK BLOOD SUGAR TWO TIMES A DAY DX: 250.00  50 each  3  . pravastatin (PRAVACHOL) 40 MG tablet TAKE 1 TABLET BY MOUTH ONCE DAILY  30 tablet  5  . predniSONE (DELTASONE) 10 MG tablet Take 10 mg by mouth daily.        [name] PROAIR HFA 108 (90 BASE) MCG/ACT inhaler INHALE 3-4 PUFFS EVERY 4-6 HOURS AS NEEDED WITH AEROCHAMBER  1 Inhaler  2  . sennosides-docusate sodium (SENOKOT-S) 8.6-50 MG tablet Take 1 tablet by mouth at bedtime as needed.        . vitamin E 400 UNIT capsule Take 400 Units by mouth 2 (two) times daily.       [name] albuterol (PROVENTIL) (2.5 MG/3ML) 0.083% nebulizer solution Take 2.5 mg by nebulization 4 (four) times daily as needed.        . budesonide-formoterol (SYMBICORT) 160-4.5 MCG/ACT inhaler Inhale 2 puffs into the lungs 2 (two) times daily.  1 Inhaler  1  . sertraline (ZOLOFT) 25 MG tablet Take 25 mg by mouth daily.        [name] tiotropium (SPIRIVA) 18 MCG inhalation capsule Place 18 mcg into inhaler and inhale daily.        [name] DISCONTD: ADVAIR DISKUS 500-50 MCG/DOSE AEPB INHALE 1 PUFF TWICE A DAY  1  each  12

## 2011-07-29 NOTE — Progress Notes (Signed)
Subjective:    Patient ID: Kaitlin Henderson, female    DOB: August 10, 1941, 70 y.o.   MRN: [id number]  HPI here for follow up - reviewed chronic medical issues:  DM2 - dx 03/2010 by routine pulm labs started on metformin + glimepiride 03/2010 reports compliance with ongoing medical treatment and no changes in medication dose or frequency. denies adverse side effects related to current therapy. no hypoglycemic events or symptoms - checks sugars 2x/day - home cbg log reviewed - range 101-170s, lowest in AM   COPD - advanced dz - follows with pulm regularly; previously enrolled with hospice for same since 06/2009 through summer 2012; chronic pred + cont O2 use - daily dyspnea on exertion but no cough or flares at this time - reports compliance with ongoing medical treatment and no changes in medication dose or frequency. denies adverse side effects related to current therapy.   breast cancer history. >56yr out - dx 1998 s/p chemo and xrt - s/p r mastect for same -   anxiety - largely related to trouble breathing and fear of being alone - "i have no one" since mom & spouse expired and children are "distantly involved" per her report - uses BZ as needed - started SSRI 09/2010- then mom passed 01/2011 -still "feels sad" but no new depression, insomnia or anxiety symptoms  Past Medical History  Diagnosis Date  . Breast cancer, right breast 1998    s/p chemo & XRT, right mastectomy  . OBESITY   . CORONARY ARTERY DISEASE   . APHTHOUS STOMATITIS   . DEPRESSION     started sertraline 09/2010  . DIABETES, TYPE 2 dx 03/2010  . HYPERLIPIDEMIA   . Hypothyroidism   . C O P D     chronic O2     Review of Systems  Constitutional: Positive for fatigue. Negative for fever.  Cardiovascular: Negative for chest pain.  Genitourinary: Negative for dysuria and frequency.  Neurological: Positive for tremors (right hand). Negative for headaches.       Objective:   Physical Exam BP 128/70  Pulse 82   Temp(Src) 98.4 F (36.9 C) (Oral)  Wt 204 lb 12.8 oz (92.897 kg)  SpO2 94% Wt Readings from Last 3 Encounters:  07/29/11 204 lb 12.8 oz (92.897 kg)  03/27/11 208 lb 1.9 oz (94.403 kg)  03/27/11 208 lb 12.8 oz (94.711 kg)   Constitutional: She is obese; appears well-developed and well-nourished. No distress.  Neck: Normal range of motion. Neck supple. No JVD present. No thyromegaly present.  Cardiovascular: Normal rate, regular rhythm and normal heart sounds.  No murmur heard. No BLE edema. Pulmonary/Chest: Effort normal at rest and breath sounds diminished at bases. No respiratory distress. She has no wheezes.  Skin: Pale, chronically - but skin is warm and dry. No rash noted. No erythema.  Neuro: R hand tremor resting and active but paroxsymal - periods of rest when distracted Psychiatric: She has a normal mood and affect, mild anxiety. Appropriately despondent about sad events in her past. Her behavior is normal. Judgment and thought content normal.      Lab Results  Component Value Date   WBC 8.5 03/13/2010   HGB 16.3* 03/13/2010   HCT 46.5* 03/13/2010   PLT 216.0 03/13/2010   CHOL 171 06/26/2010   TRIG 158.0* 06/26/2010   HDL 52.00 06/26/2010   ALT 23 09/25/2010   AST 22 09/25/2010   NA 140 12/24/2010   K 4.5 12/24/2010   CL 94* 12/24/2010  CREATININE 1.0 12/24/2010   BUN 18 12/24/2010   CO2 31 12/24/2010   TSH 0.64 06/26/2010   HGBA1C 7.0* 03/27/2011   Assessment & Plan:   See problem list. Medications and labs reviewed today.  R hand tremor - ongoing >6 weeks. suspect ET with exac by anxiety and pulm meds - pt requests neuro eval as concerned with PK - no FH same (ET or PK)

## 2011-07-29 NOTE — Patient Instructions (Signed)
It was good to see you today. Test(s) ordered today. Your results will be called to you after review (48-72hours after test completion). If any changes need to be made, you will be notified at that time. Medications reviewed, no changes at this time we'll make referral to Dr. Modesto Charon neurologist to evaluate your hand tremor. Our office will contact you regarding appointment(s) once made. Please schedule followup in 3-4 months for diabetes, call sooner if problems.

## 2011-07-29 NOTE — Assessment & Plan Note (Signed)
Lab Results  Component Value Date   TSH 0.64 06/26/2010   Reports hx same but not on tx for last few years - will check tsh now

## 2011-07-30 NOTE — Assessment & Plan Note (Addendum)
Stabel Gold III copd Plan Change advair to symbicort 160 two puff bid, to better access samples Samples of spiriva given  Return in   4 months

## 2011-08-14 ENCOUNTER — Telehealth: Payer: Self-pay

## 2011-08-14 MED ORDER — AZITHROMYCIN 250 MG PO TABS: ORAL_TABLET | ORAL | Status: AC

## 2011-08-14 NOTE — Telephone Encounter (Signed)
Pt called c/o sinus congestion and pressure, ST and cough. Pt says she believes this to be a sinus infection. Pt also declined OV citing her recent  OV 11/26. Pt is requesting Rx to treat, please advise.

## 2011-08-14 NOTE — Telephone Encounter (Signed)
E-rx Zpak done - OV if unimproved or worse symptoms - thanks

## 2011-08-14 NOTE — Telephone Encounter (Signed)
Pt advised of MD's recommendation and Rx/pharmacy.

## 2011-09-04 ENCOUNTER — Ambulatory Visit: Payer: Medicare Other | Admitting: Neurology

## 2011-09-05 ENCOUNTER — Encounter: Payer: Self-pay | Admitting: Neurology

## 2011-09-05 ENCOUNTER — Ambulatory Visit (INDEPENDENT_AMBULATORY_CARE_PROVIDER_SITE_OTHER): Payer: Medicare Other | Admitting: Neurology

## 2011-09-05 ENCOUNTER — Telehealth: Payer: Self-pay | Admitting: Critical Care Medicine

## 2011-09-05 VITALS — BP 132/86 | HR 92 | Wt 203.0 lb

## 2011-09-05 DIAGNOSIS — G250 Essential tremor: Secondary | ICD-10-CM

## 2011-09-05 NOTE — Progress Notes (Signed)
Dear Dr. Felicity Coyer,  Thank you for having me see Kaitlin Henderson in consultation today at [facility] for her problem with tremor.  As you may recall, she is a 71 y.o. year old female with a history of COPD, diabetes who began noticing worsening tremor over the last 6 months.  It affects primarily her right hand.  It is difficult for her to drink her coffee or water.  She is unable to associate it with a particular medication but has noted that it is worse when she "doesn't eat" and then takes her K-dur and Lasix.  It is relieved by her Xanax which she uses for anxiety.  She denies stiffness in her arms, but has some stiffness in her legs.  She does not drink alcohol so cannot endorse whether it is better with alcohol.  She says her handwriting is "terrible"/  Past Medical History  Diagnosis Date  . Breast cancer, right breast 1998    s/p chemo & XRT, right mastectomy  . OBESITY   . CORONARY ARTERY DISEASE   . APHTHOUS STOMATITIS   . DEPRESSION     started sertraline 09/2010  . DIABETES, TYPE 2 dx 03/2010  . HYPERLIPIDEMIA   . Hypothyroidism   . C O P D     chronic O2    Past Surgical History  Procedure Date  . Appendectomy   . Cholecystectomy   . Abdominal hysterectomy   . Right mastectomy   . Tonsillectomy     History   Social History  . Marital Status: Widowed    Spouse Name: N/A    Number of Children: N/A  . Years of Education: N/A   Social History Main Topics  . Smoking status: Former Smoker -- 2.5 packs/day for 54 years    Types: Cigarettes    Quit date: 09/02/2006  . Smokeless tobacco: Never Used   Comment: Retired. Widow/widower since 2008-lives alone. Home hospice since 06/2009 related to COPD  . Alcohol Use: No  . Drug Use: No  . Sexually Active: None   Other Topics Concern  . None   Social History Narrative  . None    Family History  Problem Relation Age of Onset  . Diabetes Neg Hx   . Cancer Neg Hx     Current Outpatient Prescriptions on  File Prior to Visit  Medication Sig Dispense Refill  . albuterol (PROVENTIL) (2.5 MG/3ML) 0.083% nebulizer solution Take 2.5 mg by nebulization 4 (four) times daily as needed.        . ALPRAZolam (XANAX) 0.5 MG tablet Take 1 tablet (0.5 mg total) by mouth every 8 (eight) hours as needed.  30 tablet  5  . Ascorbic Acid (VITAMIN C) 500 MG tablet Take 500 mg by mouth daily.        [name] aspirin 81 MG tablet Take 81 mg by mouth daily.        . benzonatate (TESSALON) 100 MG capsule Take 100 mg by mouth every 8 (eight) hours as needed.       . budesonide-formoterol (SYMBICORT) 160-4.5 MCG/ACT inhaler Inhale 2 puffs into the lungs 2 (two) times daily.  1 Inhaler  1  . esomeprazole (NEXIUM) 40 MG capsule Take 40 mg by mouth daily before breakfast.        . furosemide (LASIX) 40 MG tablet TAKE 1 TABLET TWICE DAILY  60 tablet  5  . glimepiride (AMARYL) 2 MG tablet Take 1/2 by mouth every morning if cbg > 150      .  Glucose Blood (ONETOUCH ULTRA BLUE VI) by In Vitro route. Use two times a day to check blood sugars       . KLOR-CON M20 20 MEQ tablet TAKE 1 TABLET (20 MEQ TOTAL) BY MOUTH 2 (TWO) TIMES DAILY.  60 tablet  1  . metFORMIN (GLUCOPHAGE-XR) 500 MG 24 hr tablet TAKE 1 TABLET BY MOUTH TWICE A DAY  60 tablet  5  . Multiple Vitamins-Minerals (CENTRUM SILVER) tablet Take 1 tablet by mouth daily.        . Omega-3 Fatty Acids (FISH OIL) 1200 MG CAPS Take by mouth 2 (two) times daily.        . ONE TOUCH LANCETS MISC by Does not apply route. Use two times a day to check blood sugars        . ONE TOUCH ULTRA TEST test strip CHECK BLOOD SUGAR TWO TIMES A DAY DX: 250.00  50 each  3  . pravastatin (PRAVACHOL) 40 MG tablet TAKE 1 TABLET BY MOUTH ONCE DAILY  30 tablet  5  . predniSONE (DELTASONE) 10 MG tablet Take 10 mg by mouth daily.        [name] PROAIR HFA 108 (90 BASE) MCG/ACT inhaler INHALE 3-4 PUFFS EVERY 4-6 HOURS AS NEEDED WITH AEROCHAMBER  1 Inhaler  2  . sertraline (ZOLOFT) 25 MG tablet Take 25 mg by mouth  daily.        [name] tiotropium (SPIRIVA) 18 MCG inhalation capsule Place 18 mcg into inhaler and inhale daily.        . vitamin E 400 UNIT capsule Take 400 Units by mouth 2 (two) times daily.       . sennosides-docusate sodium (SENOKOT-S) 8.6-50 MG tablet Take 1 tablet by mouth at bedtime as needed.          Allergies  Allergen Reactions  . Codeine     REACTION: makes pt pass out  . Sulfonamide Derivatives     REACTION: edema      ROS:  13 systems were reviewed and are notable for depression and anxiety particularly since her mother died six months ago.  Patient also gets numbness in 2nd, third and fourth finger of right hand.  All other review of systems are unremarkable.   Examination:  Filed Vitals:   09/05/11 1411  BP: 132/86  Pulse: 92  Weight: 203 lb (92.08 kg)     In general, appears older than stated age.  Has O2 on.  Cardiovascular: The patient has a regular rate and rhythm and no carotid bruits.  Fundoscopy:  Disks are flat. Vessel caliber within normal limits.  Mental status:   The patient is oriented to person, place and time. Recent and remote memory are intact. Attention span and concentration are normal. Language including repetition, naming, following commands are intact. Fund of knowledge of current and historical events, as well as vocabulary are normal.  Cranial Nerves: Pupils are equally round and reactive to light. Visual fields full to confrontation. Extraocular movements are intact without nystagmus. Facial sensation and muscles of mastication are intact. Muscles of facial expression are symmetric. Hearing intact to bilateral finger rub. Tongue protrusion, uvula, palate midline.  Shoulder shrug intact  Motor:  High frequency right greater than left hand postural > rest with no kinetic component although terminal tremor on F2N.  No bradykinesia, no rigidity.    5/5  Reflexes:    Biceps  Triceps Brachioradialis Knee Ankle  Right 2+  2+  2+   2+ 1+  Left  2+  2+  2+   2+ 1+  Toes down  Coordination:  Normal finger to nose.  No dysdiadokinesia.  Sensation is intact to touch.  Gait and Station are slow.  Arm swing appropriate for speed, no tremor with walking.  Turns slowly.  Impression/Recs:    1.  Probable essential tremor, likely exacerbated by beta agonists/prednisone/anxiety.  Its response to Xanax is telling, as it would be less usual for a Parkinson's tremor to do this.  She said that the Xanax takes the tremor away, so I have given her permission to use it up to two times per day for the tremor.  Normally I use clonazepam, but given then Xanax works I think it is permissible to try this first.  Obviously we cannot use propranolol, but primidone would be another choice. 2.  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  Will consider a EMG/NCS at a later date, but I don't think this is related to her tremor.  We will see the patient back in March when she returns to see you in clinic.  Thank you for having [place] see Kaitlin Henderson in consultation.  Feel free to contact me with any questions.  Lupita Raider Modesto Charon, MD [facility], [place] [address] [place], [place] [address] Phone: [phone] Fax: [phone].

## 2011-09-05 NOTE — Telephone Encounter (Signed)
Pt has an appt today w/ Dr. Modesto Charon @ 2:30 & would like to pick up the samples at that time if possible.  Please advise.  Antionette Fairy

## 2011-09-05 NOTE — Telephone Encounter (Signed)
Patient aware 2 samples of Symbicort 160 has been placed up front to pick up.

## 2011-09-06 ENCOUNTER — Telehealth: Payer: Self-pay | Admitting: *Deleted

## 2011-09-06 NOTE — Telephone Encounter (Signed)
Pt call stating she saw on her instruction sheet from Dr. Modesto Charon that she has underactive thyroid. Pt is curious abt dx. She states she was never told anything about this. Pt wanting explanation on why she was never told & should she be taking medications for her thyroid....09/06/11@9 :15am/LMB

## 2011-09-06 NOTE — Telephone Encounter (Signed)
Pt call stating she saw on her instruction sheet from Dr.

## 2011-09-09 NOTE — Telephone Encounter (Signed)
Patient does not have underactive thyroid at this time. There is a remote prior diagnosis of hypothyroidism, but when I recheck thyroid levels in November 2012, her thyroid is normal. I will remove for this diagnosis from her problem list to avoid future confusion. No medication changes recommended.

## 2011-09-10 NOTE — Telephone Encounter (Signed)
Notified pt with md response...09/10/11@8 :39am/LMB

## 2011-09-13 ENCOUNTER — Other Ambulatory Visit: Payer: Self-pay

## 2011-09-13 DIAGNOSIS — F419 Anxiety disorder, unspecified: Secondary | ICD-10-CM

## 2011-09-13 MED ORDER — ALPRAZOLAM 0.5 MG PO TABS: 0.5000 mg | ORAL_TABLET | Freq: Two times a day (BID) | ORAL | Status: DC

## 2011-09-13 NOTE — Telephone Encounter (Signed)
ok 

## 2011-09-13 NOTE — Telephone Encounter (Signed)
Rx faxed to pharmacy, pt aware. 

## 2011-09-13 NOTE — Telephone Encounter (Signed)
Pt called stating that she was advised by Dr Nash Dimmer office to increase Alprazolam 0.5 mg to bid to control tremors. Pt states that at this does she will not have enough medication and will need to refill early. Pt is requesting updated Rx, please advise.

## 2011-09-17 ENCOUNTER — Other Ambulatory Visit: Payer: Self-pay | Admitting: Critical Care Medicine

## 2011-10-07 ENCOUNTER — Telehealth: Payer: Self-pay | Admitting: Critical Care Medicine

## 2011-10-07 MED ORDER — BUDESONIDE-FORMOTEROL FUMARATE 160-4.5 MCG/ACT IN AERO: 2.0000 | INHALATION_SPRAY | Freq: Two times a day (BID) | RESPIRATORY_TRACT | Status: DC

## 2011-10-07 MED ORDER — ALBUTEROL SULFATE HFA 108 (90 BASE) MCG/ACT IN AERS: 2.0000 | INHALATION_SPRAY | Freq: Four times a day (QID) | RESPIRATORY_TRACT | Status: DC | PRN

## 2011-10-07 MED ORDER — TIOTROPIUM BROMIDE MONOHYDRATE 18 MCG IN CAPS: 18.0000 ug | ORAL_CAPSULE | Freq: Every day | RESPIRATORY_TRACT | Status: DC

## 2011-10-07 NOTE — Telephone Encounter (Signed)
Patient given sample of spiriva and Proair. Prescription for Symbicort sent to the pharmacy, we had no samples.  [employer] form placed in PW look at.

## 2011-10-09 NOTE — Telephone Encounter (Signed)
This was signed yesterday evening by Dr. Delford Field.  There is a section that still needs to be filled out and signed by pt.  Pt aware of this and would like form mailed to her home address which I verified.  I placed form in the mail -- pt aware.

## 2011-10-09 NOTE — Telephone Encounter (Signed)
Crystal, pls advise if form for [employer]  has been taken care of for the patient.

## 2011-10-12 ENCOUNTER — Other Ambulatory Visit: Payer: Self-pay | Admitting: Internal Medicine

## 2011-10-21 ENCOUNTER — Ambulatory Visit: Payer: Medicare Other | Admitting: Neurology

## 2011-10-28 ENCOUNTER — Ambulatory Visit: Payer: Medicare Other | Admitting: Neurology

## 2011-11-06 ENCOUNTER — Other Ambulatory Visit: Payer: Self-pay | Admitting: Critical Care Medicine

## 2011-11-22 ENCOUNTER — Other Ambulatory Visit: Payer: Self-pay | Admitting: Internal Medicine

## 2011-11-22 ENCOUNTER — Other Ambulatory Visit: Payer: Self-pay | Admitting: Critical Care Medicine

## 2011-11-25 ENCOUNTER — Encounter: Payer: Self-pay | Admitting: Internal Medicine

## 2011-11-25 ENCOUNTER — Encounter: Payer: Self-pay | Admitting: Critical Care Medicine

## 2011-11-25 ENCOUNTER — Ambulatory Visit (INDEPENDENT_AMBULATORY_CARE_PROVIDER_SITE_OTHER): Payer: Medicare Other | Admitting: Internal Medicine

## 2011-11-25 ENCOUNTER — Ambulatory Visit (INDEPENDENT_AMBULATORY_CARE_PROVIDER_SITE_OTHER): Payer: Medicare Other | Admitting: Neurology

## 2011-11-25 ENCOUNTER — Encounter: Payer: Self-pay | Admitting: Neurology

## 2011-11-25 ENCOUNTER — Other Ambulatory Visit (INDEPENDENT_AMBULATORY_CARE_PROVIDER_SITE_OTHER): Payer: Medicare Other

## 2011-11-25 ENCOUNTER — Ambulatory Visit (INDEPENDENT_AMBULATORY_CARE_PROVIDER_SITE_OTHER): Payer: Medicare Other | Admitting: Critical Care Medicine

## 2011-11-25 VITALS — BP 128/80 | HR 84 | Wt 191.0 lb

## 2011-11-25 VITALS — BP 128/72 | HR 94 | Temp 97.7°F | Ht 62.5 in | Wt 192.4 lb

## 2011-11-25 VITALS — BP 120/62 | HR 82 | Temp 98.3°F | Ht 64.0 in | Wt 190.8 lb

## 2011-11-25 DIAGNOSIS — E119 Type 2 diabetes mellitus without complications: Secondary | ICD-10-CM

## 2011-11-25 DIAGNOSIS — G250 Essential tremor: Secondary | ICD-10-CM

## 2011-11-25 DIAGNOSIS — F329 Major depressive disorder, single episode, unspecified: Secondary | ICD-10-CM

## 2011-11-25 DIAGNOSIS — G252 Other specified forms of tremor: Secondary | ICD-10-CM

## 2011-11-25 DIAGNOSIS — J449 Chronic obstructive pulmonary disease, unspecified: Secondary | ICD-10-CM

## 2011-11-25 DIAGNOSIS — E785 Hyperlipidemia, unspecified: Secondary | ICD-10-CM

## 2011-11-25 LAB — LIPID PANEL
LDL Cholesterol: 96 mg/dL (ref 0–99)
Total CHOL/HDL Ratio: 3
Triglycerides: 148 mg/dL (ref 0.0–149.0)

## 2011-11-25 LAB — MICROALBUMIN / CREATININE URINE RATIO: Microalb, Ur: 0.3 mg/dL (ref 0.0–1.9)

## 2011-11-25 MED ORDER — METFORMIN HCL ER 500 MG PO TB24: 500.0000 mg | ORAL_TABLET | Freq: Two times a day (BID) | ORAL | Status: DC

## 2011-11-25 NOTE — Assessment & Plan Note (Signed)
Was improved with addition of SSRI Oct 20, 2010 - but worse following death of mom 18-Feb-2011 -  Pt agreeable to resuming same now to help tremor by controlling anxiety overlap - erx done Continue prn BZs;  continue present plan and medications.

## 2011-11-25 NOTE — Assessment & Plan Note (Signed)
On statin - last at goal, check annually and adjust as needed The current medical regimen is effective;  continue present plan and medications.

## 2011-11-25 NOTE — Assessment & Plan Note (Signed)
Severe golds D. COPD stable at this time Plan Continued inhaled medications as prescribed

## 2011-11-25 NOTE — Assessment & Plan Note (Signed)
The current medical regimen is effective;  continue present plan and medications.  Metformin daily with prn amaryl for cbg >150 - no change in steroid dose Home cbg 95-150s - reasonable control for state of other health issues Also on ASA 81, statin - check microalb Lab Results  Component Value Date   HGBA1C 6.6* 07/29/2011

## 2011-11-25 NOTE — Assessment & Plan Note (Signed)
eval and treatment as ongoing by neuro - s/p OP eval for same 09/2011 - Using xanax for symptomatic control - avoid beta-blocker due to advanced COPD Reviewed with pt - no evidence for PK dz

## 2011-11-25 NOTE — Progress Notes (Signed)
Subjective:    Patient ID: Kaitlin Henderson, female    DOB: February 10, 1941, 71 y.o.   MRN: [id number]  HPI here for follow up - reviewed chronic medical issues:  DM2 - dx 03/2010 by routine pulm labs; started on metformin + glimepiride 03/2010; reports compliance with ongoing medical treatment and no changes in medication dose or frequency. denies adverse side effects related to current therapy. no hypoglycemic events or symptoms - checks sugars 2x/day - home cbg log reviewed - range 101-170s, lowest in AM   COPD - advanced dz - follows with pulm regularly; previously enrolled with hospice for same since 06/2009 through summer 2012; chronic pred + cont O2 use, inhaler changes fall 2012 with slightly improved symptoms - daily dyspnea on exertion but no cough or flares at this time - reports compliance with ongoing medical treatment and no changes in medication dose or frequency. denies adverse side effects related to current therapy.   breast cancer history. >51yr out - dx 1998 s/p chemo and xrt - s/p r mastect for same -   Anxiety/depression- largely related to trouble breathing and fear of being alone - "i have no one" since mom & spouse expired and children are "distantly involved" per her report - uses BZ as needed - started SSRI 09/2010- then mom passed 01/2011 -still "feels sad"  - agreeable to resume SSRI per neuro recs to help anxiety symptoms  Past Medical History  Diagnosis Date  . Breast cancer, right breast 1998    s/p chemo & XRT, right mastectomy  . OBESITY   . CORONARY ARTERY DISEASE   . APHTHOUS STOMATITIS   . DEPRESSION     started sertraline 09/2010  . DIABETES, TYPE 2 dx 03/2010  . HYPERLIPIDEMIA   . Hypothyroidism   . C O P D     chronic O2  . Essential tremor      Review of Systems  Constitutional: Positive for fatigue. Negative for fever.  Cardiovascular: Negative for chest pain.  Genitourinary: Negative for dysuria and frequency.  Neurological: Negative for  headaches.       Objective:   Physical Exam BP 120/62  Pulse 82  Temp(Src) 98.3 F (36.8 C) (Oral)  Ht 5\' 4"  (1.626 m)  Wt 190 lb 12.8 oz (86.546 kg)  BMI 32.75 kg/m2  SpO2 94% Wt Readings from Last 3 Encounters:  11/25/11 190 lb 12.8 oz (86.546 kg)  11/25/11 192 lb 6.4 oz (87.272 kg)  11/25/11 191 lb (86.637 kg)   Constitutional: She is obese; appears well-developed and well-nourished. No distress.  Neck: Normal range of motion. Neck supple. No JVD present. No thyromegaly present.  Cardiovascular: Normal rate, regular rhythm and normal heart sounds.  No murmur heard. No BLE edema. Pulmonary/Chest: Effort normal at rest: breath sounds diminished at bases. She has no wheezes.  Skin: Pale, chronically - but skin is warm and dry. No rash noted. No erythema.  Neuro: R hand active tremor without change Psychiatric: She has a dysphoric mood and affect, mild anxiety. Appropriately despondent about sad events in her past. Her behavior is normal. Judgment and thought content normal.      Lab Results  Component Value Date   WBC 8.5 03/13/2010   HGB 16.3* 03/13/2010   HCT 46.5* 03/13/2010   PLT 216.0 03/13/2010   CHOL 171 06/26/2010   TRIG 158.0* 06/26/2010   HDL 52.00 06/26/2010   ALT 23 09/25/2010   AST 22 09/25/2010   NA 140 12/24/2010   K  4.5 12/24/2010   CL 94* 12/24/2010   CREATININE 1.0 12/24/2010   BUN 18 12/24/2010   CO2 31 12/24/2010   TSH 0.60 07/29/2011   HGBA1C 6.6* 07/29/2011   Assessment & Plan:   See problem list. Medications and labs reviewed today.

## 2011-11-25 NOTE — Patient Instructions (Signed)
It was good to see you today. Test(s) ordered today. Your results will be called to you after review (48-72hours after test completion). If any changes need to be made, you will be notified at that time. Medications reviewed, no changes at this time Please schedule followup in 4 months for diabetes, call sooner if problems.

## 2011-11-25 NOTE — Patient Instructions (Signed)
No change in medications. Return in         4 months 

## 2011-11-25 NOTE — Progress Notes (Signed)
Subjective:    Patient ID: Kaitlin Henderson, female    DOB: September 27, 1940, 71 y.o.   MRN: [id number]  HPI  31 y.o.WF copd  07/30/11 Now on girlfriends symbicort.    Dyspnea not as good off meds. Proair not as good.   No real chest pain.  Still thick pearl like mucus. Not able to afford meds. May have to change to lesser expensive Neb Meds. Pt denies any significant sore throat, nasal congestion or excess secretions, fever, chills, sweats, unintended weight loss, pleurtic or exertional chest pain, orthopnea PND, or leg swelling Pt denies any increase in rescue therapy over baseline, denies waking up needing it or having any early am or nocturnal exacerbations of coughing/wheezing/or dyspnea. Pt also denies any obvious fluctuation in symptoms with  weather or environmental change or other alleviating or aggravating factors  11/25/2011 At the last office visit we continued Spiriva and add Symbicort and discontinued Advair.  Now has mold under the house.  Coughing up pearls.  Not in bedroom.  Dyspnea is the same.   No chest pain.  No edema in feet. Pt denies any significant sore throat, nasal congestion or excess secretions, fever, chills, sweats, unintended weight loss, pleurtic or exertional chest pain, orthopnea PND, or leg swelling Pt denies any increase in rescue therapy over baseline, denies waking up needing it or having any early am or nocturnal exacerbations of coughing/wheezing/or dyspnea. Pt also denies any obvious fluctuation in symptoms with  weather or environmental change or other alleviating or aggravating factors      Past Medical History  Diagnosis Date  . Breast cancer, right breast 1998    s/p chemo & XRT, right mastectomy  . OBESITY   . CORONARY ARTERY DISEASE   . APHTHOUS STOMATITIS   . DEPRESSION     started sertraline 09/2010  . DIABETES, TYPE 2 dx 03/2010  . HYPERLIPIDEMIA   . Hypothyroidism   . C O P D     chronic O2     Family History  Problem Relation  Age of Onset  . Diabetes Neg Hx   . Cancer Neg Hx      History   Social History  . Marital Status: Widowed    Spouse Name: N/A    Number of Children: N/A  . Years of Education: N/A   Occupational History  . Not on file.   Social History Main Topics  . Smoking status: Former Smoker -- 2.5 packs/day for 54 years    Types: Cigarettes    Quit date: 09/02/2006  . Smokeless tobacco: Never Used   Comment: Retired. Widow/widower since 2008-lives alone. Home hospice since 06/2009 related to COPD  . Alcohol Use: No  . Drug Use: No  . Sexually Active: Not on file   Other Topics Concern  . Not on file   Social History Narrative  . No narrative on file     Allergies  Allergen Reactions  . Codeine     REACTION: makes pt pass out  . Sulfonamide Derivatives     REACTION: edema     Outpatient Prescriptions Prior to Visit  Medication Sig Dispense Refill  . albuterol (PROAIR HFA) 108 (90 BASE) MCG/ACT inhaler Inhale 2 puffs into the lungs every 6 (six) hours as needed for wheezing.  1 Inhaler  0  . albuterol (PROVENTIL) (2.5 MG/3ML) 0.083% nebulizer solution Take 2.5 mg by nebulization 4 (four) times daily as needed.        . ALPRAZolam (XANAX) 0.5  MG tablet Take 1 tablet (0.5 mg total) by mouth 2 (two) times daily.  60 tablet  5  . Ascorbic Acid (VITAMIN C) 500 MG tablet Take 500 mg by mouth daily.        [name] aspirin 81 MG tablet Take 81 mg by mouth daily.        . benzonatate (TESSALON) 100 MG capsule Take 100 mg by mouth every 8 (eight) hours as needed.       . budesonide-formoterol (SYMBICORT) 160-4.5 MCG/ACT inhaler Inhale 2 puffs into the lungs 2 (two) times daily.  1 Inhaler  3  . furosemide (LASIX) 40 MG tablet TAKE 1 TABLET TWICE DAILY  60 tablet  5  . glimepiride (AMARYL) 2 MG tablet Take 1/2 by mouth every morning if cbg > 150      . Glucose Blood (ONETOUCH ULTRA BLUE VI) by In Vitro route. Use two times a day to check blood sugars       . KLOR-CON M20 20 MEQ tablet TAKE 1  TABLET (20 MEQ TOTAL) BY MOUTH 2 (TWO) TIMES DAILY.  60 tablet  1  . metFORMIN (GLUCOPHAGE-XR) 500 MG 24 hr tablet TAKE 1 TABLET BY MOUTH TWICE A DAY  60 tablet  5  . Multiple Vitamins-Minerals (CENTRUM SILVER) tablet Take 1 tablet by mouth daily.        [name] NEXIUM 40 MG capsule TAKE 1 CAPSULE BY MOUTH ONCE A DAY  30 capsule  4  . Omega-3 Fatty Acids (FISH OIL) 1200 MG CAPS Take by mouth 2 (two) times daily.        . ONE TOUCH LANCETS MISC by Does not apply route. Use two times a day to check blood sugars        . ONE TOUCH ULTRA TEST test strip CHECK BLOOD SUGAR TWO TIMES A DAY DX: 250.00  50 each  3  . pravastatin (PRAVACHOL) 40 MG tablet TAKE 1 TABLET BY MOUTH ONCE DAILY  30 tablet  5  . predniSONE (DELTASONE) 10 MG tablet TAKE 1 TABLET EVERY DAY  100 tablet  5  . sennosides-docusate sodium (SENOKOT-S) 8.6-50 MG tablet Take 1 tablet by mouth at bedtime as needed.        . sertraline (ZOLOFT) 25 MG tablet Take 25 mg by mouth daily.        [name] tiotropium (SPIRIVA) 18 MCG inhalation capsule Place 1 capsule (18 mcg total) into inhaler and inhale daily.  30 capsule  0  . vitamin E 400 UNIT capsule Take 400 Units by mouth 2 (two) times daily.       [name] KLOR-CON M20 20 MEQ tablet TAKE 1 TABLET (20 MEQ TOTAL) BY MOUTH 2 (TWO) TIMES DAILY.  60 tablet  0     Review of Systems  Constitutional:   No  weight loss, night sweats,  Fevers, chills, fatigue, lassitude. HEENT:   No headaches,  Difficulty swallowing,  Tooth/dental problems,  Sore throat,                No sneezing, itching, ear ache, nasal congestion, post nasal drip,   CV:  No chest pain,  Orthopnea, PND, swelling in lower extremities, anasarca, dizziness, palpitations  GI  No heartburn, indigestion, abdominal pain, nausea, vomiting, diarrhea, change in bowel habits, loss of appetite  Resp: Notes  shortness of breath with exertion not  at rest.  No excess mucus, no productive cough,  No non-productive cough,  No coughing up of blood.  No  change  in color of mucus.  No wheezing.  No chest wall deformity  Skin: no rash or lesions.  GU: no dysuria, change in color of urine, no urgency or frequency.  No flank pain.  MS:  No joint pain or swelling.  No decreased range of motion.  No back pain.  Psych:  No change in mood or affect. No depression or anxiety.  No memory loss. t    Objective:   Physical Exam  Filed Vitals:   11/25/11 1341  BP: 128/72  Pulse: 94  Temp: 97.7 F (36.5 C)  TempSrc: Oral  Height: 5' 2.5" (1.588 m)  Weight: 192 lb 6.4 oz (87.272 kg)  SpO2: 93%    Gen: Pleasant, well-nourished, in no distress,  normal affect  ENT: No lesions,  mouth clear,  oropharynx clear, no postnasal drip  Neck: No JVD, no TMG, no carotid bruits  Lungs: No use of accessory muscles, no dullness to percussion, distant BS   Cardiovascular: RRR, heart sounds normal, no murmur or gallops, no peripheral edema  Abdomen: soft and NT, no HSM,  BS normal  Musculoskeletal: No deformities, no cyanosis or clubbing  Neuro: alert, non focal  Skin: Warm, no lesions or rashes   PFT Conversion 12/06/2008  FVC 1.95  FVC PREDICT 2.76  FVC  % Predicted 71  FEV1 1.01  FEV1 PREDICT 1.97  FEV % Predicted 51  FEV1/FVC 51.8  FEV1/FVC PRE 72  FEV1/FVC%EXP   FeF 25-75 0.31  FeF 25-75 % Predicted 2.29  FEF % EXPEC 13  POST FVC 2.08  FVCPRDPST 2.76  POST FVC%EXP 75  POST FEV1 1.16  FEV1PRDPST 1.97  POSTFEV1%PRD 59  PSTFEV1/FVC 56  FEV1FVCPRDPS 72  PSTFEF25/75% 0.46  PSTFEF25/75P 2.29  FEF2575%EXPS 20  Residual Volume (ml) 2.86  RV % EXPECT 156  DLCO (ml/mmHg sec) 12.8  DLCO % EXPEC 53  DLCO/VA 4.35  DLCO/VA%EXP 122  PFT RSLT severe obstruction with significant bronchodilator response   PFT Conversion 09/25/2010  FVC   FVC PREDICT 2.755  FVC  % Predicted 54.77  FEV1   FEV1 PREDICT 2.081  FEV % Predicted 42.52  FEV1/FVC   FEV1/FVC PRE 75.934  FEV1/FVC%EXP 77.220  FeF 25-75   FeF 25-75 % Predicted   FEF %  EXPEC 27.860  POST FVC   FVCPRDPST   POST FVC%EXP   POST FEV1   FEV1PRDPST   POSTFEV1%PRD   PSTFEV1/FVC   FEV1FVCPRDPS   PSTFEF25/75%   PSTFEF25/75P   FEF2575%EXPS   Residual Volume (ml)   RV % EXPECT   DLCO (ml/mmHg sec)   DLCO % EXPEC   DLCO/VA   DLCO/VA%EXP   PFT RSLT    Assessment & Plan:   C O P D Severe golds D. COPD stable at this time Plan Continued inhaled medications as prescribed     Updated Medication List Outpatient Encounter Prescriptions as of 11/25/2011  Medication Sig Dispense Refill  . albuterol (PROAIR HFA) 108 (90 BASE) MCG/ACT inhaler Inhale 2 puffs into the lungs every 6 (six) hours as needed for wheezing.  1 Inhaler  0  . albuterol (PROVENTIL) (2.5 MG/3ML) 0.083% nebulizer solution Take 2.5 mg by nebulization 4 (four) times daily as needed.        . ALPRAZolam (XANAX) 0.5 MG tablet Take 1 tablet (0.5 mg total) by mouth 2 (two) times daily.  60 tablet  5  . Ascorbic Acid (VITAMIN C) 500 MG tablet Take 500 mg by mouth daily.        [name]  aspirin 81 MG tablet Take 81 mg by mouth daily.        . benzonatate (TESSALON) 100 MG capsule Take 100 mg by mouth every 8 (eight) hours as needed.       . budesonide-formoterol (SYMBICORT) 160-4.5 MCG/ACT inhaler Inhale 2 puffs into the lungs 2 (two) times daily.  1 Inhaler  3  . furosemide (LASIX) 40 MG tablet TAKE 1 TABLET TWICE DAILY  60 tablet  5  . glimepiride (AMARYL) 2 MG tablet Take 1/2 by mouth every morning if cbg > 150      . Glucose Blood (ONETOUCH ULTRA BLUE VI) by In Vitro route. Use two times a day to check blood sugars       . KLOR-CON M20 20 MEQ tablet TAKE 1 TABLET (20 MEQ TOTAL) BY MOUTH 2 (TWO) TIMES DAILY.  60 tablet  1  . metFORMIN (GLUCOPHAGE-XR) 500 MG 24 hr tablet TAKE 1 TABLET BY MOUTH TWICE A DAY  60 tablet  5  . Multiple Vitamins-Minerals (CENTRUM SILVER) tablet Take 1 tablet by mouth daily.        [name] NEXIUM 40 MG capsule TAKE 1 CAPSULE BY MOUTH ONCE A DAY  30 capsule  4  . Omega-3 Fatty  Acids (FISH OIL) 1200 MG CAPS Take by mouth 2 (two) times daily.        . ONE TOUCH LANCETS MISC by Does not apply route. Use two times a day to check blood sugars        . ONE TOUCH ULTRA TEST test strip CHECK BLOOD SUGAR TWO TIMES A DAY DX: 250.00  50 each  3  . pravastatin (PRAVACHOL) 40 MG tablet TAKE 1 TABLET BY MOUTH ONCE DAILY  30 tablet  5  . predniSONE (DELTASONE) 10 MG tablet TAKE 1 TABLET EVERY DAY  100 tablet  5  . sennosides-docusate sodium (SENOKOT-S) 8.6-50 MG tablet Take 1 tablet by mouth at bedtime as needed.        . sertraline (ZOLOFT) 25 MG tablet Take 25 mg by mouth daily.        [name] tiotropium (SPIRIVA) 18 MCG inhalation capsule Place 1 capsule (18 mcg total) into inhaler and inhale daily.  30 capsule  0  . vitamin E 400 UNIT capsule Take 400 Units by mouth 2 (two) times daily.       [name] DISCONTD: KLOR-CON M20 20 MEQ tablet TAKE 1 TABLET (20 MEQ TOTAL) BY MOUTH 2 (TWO) TIMES DAILY.  60 tablet  0

## 2011-11-25 NOTE — Progress Notes (Signed)
Dear Dr. Felicity Coyer,  Thank you for having me see Kaitlin Henderson in follow up today at [facility] for her problem with tremor.  As you may recall, she is a 71 y.o. year old female with a history of COPD, chronic prednisone use, depression who returns for what I believed to be an essential tremor exacerbated by medication use.  She says that her tremor has been much better, although doesn't endorse any change in her medications.  She has not required an increase in her Xanax.  She says that the tremor has only bothered her twice.  In fact she says the tremor doesn't really bother her functionally, but just is embarrassing.  She denies anxiety but does endorse that she has been crying frequently.  During our interview the tremor became significant worse.  She says she was originally taking Zoloft 25mg  daily, but has stopped it "as it was not helping her".  Medical history, social history, and family history were reviewed and have not changed since the last clinic visit.  Current Outpatient Prescriptions on File Prior to Visit  Medication Sig Dispense Refill  . albuterol (PROAIR HFA) 108 (90 BASE) MCG/ACT inhaler Inhale 2 puffs into the lungs every 6 (six) hours as needed for wheezing.  1 Inhaler  0  . albuterol (PROVENTIL) (2.5 MG/3ML) 0.083% nebulizer solution Take 2.5 mg by nebulization 4 (four) times daily as needed.        . ALPRAZolam (XANAX) 0.5 MG tablet Take 1 tablet (0.5 mg total) by mouth 2 (two) times daily.  60 tablet  5  . Ascorbic Acid (VITAMIN C) 500 MG tablet Take 500 mg by mouth daily.        [name] aspirin 81 MG tablet Take 81 mg by mouth daily.        . benzonatate (TESSALON) 100 MG capsule Take 100 mg by mouth every 8 (eight) hours as needed.       . budesonide-formoterol (SYMBICORT) 160-4.5 MCG/ACT inhaler Inhale 2 puffs into the lungs 2 (two) times daily.  1 Inhaler  3  . furosemide (LASIX) 40 MG tablet TAKE 1 TABLET TWICE DAILY  60 tablet  5  . glimepiride (AMARYL) 2 MG  tablet Take 1/2 by mouth every morning if cbg > 150      . Glucose Blood (ONETOUCH ULTRA BLUE VI) by In Vitro route. Use two times a day to check blood sugars       . KLOR-CON M20 20 MEQ tablet TAKE 1 TABLET (20 MEQ TOTAL) BY MOUTH 2 (TWO) TIMES DAILY.  60 tablet  1  . KLOR-CON M20 20 MEQ tablet TAKE 1 TABLET (20 MEQ TOTAL) BY MOUTH 2 (TWO) TIMES DAILY.  60 tablet  0  . metFORMIN (GLUCOPHAGE-XR) 500 MG 24 hr tablet TAKE 1 TABLET BY MOUTH TWICE A DAY  60 tablet  5  . Multiple Vitamins-Minerals (CENTRUM SILVER) tablet Take 1 tablet by mouth daily.        [name] NEXIUM 40 MG capsule TAKE 1 CAPSULE BY MOUTH ONCE A DAY  30 capsule  4  . Omega-3 Fatty Acids (FISH OIL) 1200 MG CAPS Take by mouth 2 (two) times daily.        . ONE TOUCH LANCETS MISC by Does not apply route. Use two times a day to check blood sugars        . ONE TOUCH ULTRA TEST test strip CHECK BLOOD SUGAR TWO TIMES A DAY DX: 250.00  50 each  3  .  pravastatin (PRAVACHOL) 40 MG tablet TAKE 1 TABLET BY MOUTH ONCE DAILY  30 tablet  5  . predniSONE (DELTASONE) 10 MG tablet TAKE 1 TABLET EVERY DAY  100 tablet  5  . sennosides-docusate sodium (SENOKOT-S) 8.6-50 MG tablet Take 1 tablet by mouth at bedtime as needed.        . sertraline (ZOLOFT) 25 MG tablet Take 25 mg by mouth daily.        [name] tiotropium (SPIRIVA) 18 MCG inhalation capsule Place 1 capsule (18 mcg total) into inhaler and inhale daily.  30 capsule  0  . vitamin E 400 UNIT capsule Take 400 Units by mouth 2 (two) times daily.         Allergies  Allergen Reactions  . Codeine     REACTION: makes pt pass out  . Sulfonamide Derivatives     REACTION: edema    ROS:  13 systems were reviewed and are notable for difficult walking.  She has numbness and tingling in her right 1st 2nd and 3rd fingers.  All other review of systems are unremarkable.  Exam: . Filed Vitals:   11/25/11 1128  BP: 128/80  Pulse: 84  Weight: 191 lb (86.637 kg)    In general, appears tearful.  Mental  status:   The patient is oriented to person, place and time. Recent and remote memory are intact. Attention span and concentration are normal. Language including repetition, naming, following commands are intact. Fund of knowledge of current and historical events, as well as vocabulary are normal.  Cranial Nerves:  Extraocular movements are intact without nystagmus. Facial sensation and muscles of mastication are intact. Muscles of facial expression are symmetric.Tongue protrusion, uvula, palate midline.  Shoulder shrug intact  Motor:  Normal bulk and tone. ? decreased amplitudes of movements in fingers bilaterally.  Fine postural tremor, bilaterally, worse on right, with some resting component.  Coordination:  Normal finger to nose  Gait:  Wide based slow gait.  Worsening of tremor on right.    Impression/Recommendations:  1.  Tremor - I am less sure whether this is Parkinsonian or essential given its worsening of resting component during gait testing and asymmetry.  However, it clearly seems to get worse during times of stress and Xanax seems to help this.  She is also clearly depressed and I think anxious.  It may make sense to restart her Zoloft at 50mg  daily and titrate this to effect to see if this helps her anxiety/depression and thus tremor.  She can use Xanax up to 0.5 bid for the tremor as needed.  2.  ?median neuropathy - she has numbness in her right hand in the 1,2,3 digits.  I will just continue to monitor this as it does not seem to bother her significantly. 3.  Wide based gait - I suspect a combination of deconditioning and perhaps peripheral neuropathy.  Not parkinsonian, will continue to monitor.  We will see the patient back in 3 months.  Lupita Raider Modesto Charon, MD [facility], [place]

## 2011-11-27 ENCOUNTER — Ambulatory Visit: Payer: Medicare Other | Admitting: Internal Medicine

## 2011-12-04 ENCOUNTER — Telehealth: Payer: Self-pay | Admitting: Critical Care Medicine

## 2011-12-04 ENCOUNTER — Other Ambulatory Visit: Payer: Self-pay | Admitting: Critical Care Medicine

## 2011-12-04 MED ORDER — ALBUTEROL SULFATE HFA 108 (90 BASE) MCG/ACT IN AERS: 2.0000 | INHALATION_SPRAY | Freq: Four times a day (QID) | RESPIRATORY_TRACT | Status: DC | PRN

## 2011-12-04 NOTE — Telephone Encounter (Signed)
I spoke with pt and advised her we did not have any samples at this time. She stated she needed her proair rx sent to [facility] with #2. i advised will send rx and nothing further was needed

## 2011-12-30 ENCOUNTER — Telehealth: Payer: Self-pay

## 2011-12-30 NOTE — Telephone Encounter (Signed)
I spoke with pt and advised her did not have any proair but could leave ventolin and 1 sample of symbicort upfront for pick up. She voiced her understanding and needed nothing further

## 2012-01-01 ENCOUNTER — Other Ambulatory Visit: Payer: Self-pay | Admitting: Internal Medicine

## 2012-01-01 DIAGNOSIS — I214 Non-ST elevation (NSTEMI) myocardial infarction: Secondary | ICD-10-CM

## 2012-01-01 HISTORY — DX: Non-ST elevation (NSTEMI) myocardial infarction: I21.4

## 2012-01-06 ENCOUNTER — Other Ambulatory Visit: Payer: Self-pay | Admitting: Critical Care Medicine

## 2012-01-06 ENCOUNTER — Telehealth: Payer: Self-pay | Admitting: Critical Care Medicine

## 2012-01-06 NOTE — Telephone Encounter (Addendum)
ATC x1 for pt. Need to verify what medications need to be refilled.

## 2012-01-07 MED ORDER — POTASSIUM CHLORIDE CRYS ER 20 MEQ PO TBCR: 20.0000 meq | EXTENDED_RELEASE_TABLET | Freq: Two times a day (BID) | ORAL | Status: DC

## 2012-01-07 NOTE — Telephone Encounter (Signed)
Spoke with pt to verify msg, Rx for K sent to pharm and pt states nothing further needed.

## 2012-01-17 ENCOUNTER — Emergency Department (HOSPITAL_COMMUNITY): Payer: Medicare Other

## 2012-01-17 ENCOUNTER — Inpatient Hospital Stay (HOSPITAL_COMMUNITY)
Admission: EM | Admit: 2012-01-17 | Discharge: 2012-01-28 | DRG: 193 | Disposition: A | Discharge status: Skilled Nursing Facility | Payer: Medicare Other | Attending: Internal Medicine | Admitting: Internal Medicine

## 2012-01-17 ENCOUNTER — Encounter (HOSPITAL_COMMUNITY): Payer: Self-pay

## 2012-01-17 DIAGNOSIS — L659 Nonscarring hair loss, unspecified: Secondary | ICD-10-CM

## 2012-01-17 DIAGNOSIS — H109 Unspecified conjunctivitis: Secondary | ICD-10-CM | POA: Diagnosis present

## 2012-01-17 DIAGNOSIS — J441 Chronic obstructive pulmonary disease with (acute) exacerbation: Secondary | ICD-10-CM | POA: Diagnosis present

## 2012-01-17 DIAGNOSIS — R358 Other polyuria: Secondary | ICD-10-CM

## 2012-01-17 DIAGNOSIS — J9600 Acute respiratory failure, unspecified whether with hypoxia or hypercapnia: Secondary | ICD-10-CM | POA: Diagnosis present

## 2012-01-17 DIAGNOSIS — Z66 Do not resuscitate: Secondary | ICD-10-CM | POA: Diagnosis present

## 2012-01-17 DIAGNOSIS — R0603 Acute respiratory distress: Secondary | ICD-10-CM

## 2012-01-17 DIAGNOSIS — E119 Type 2 diabetes mellitus without complications: Secondary | ICD-10-CM

## 2012-01-17 DIAGNOSIS — E669 Obesity, unspecified: Secondary | ICD-10-CM

## 2012-01-17 DIAGNOSIS — C50911 Malignant neoplasm of unspecified site of right female breast: Secondary | ICD-10-CM

## 2012-01-17 DIAGNOSIS — E039 Hypothyroidism, unspecified: Secondary | ICD-10-CM | POA: Diagnosis present

## 2012-01-17 DIAGNOSIS — J449 Chronic obstructive pulmonary disease, unspecified: Secondary | ICD-10-CM

## 2012-01-17 DIAGNOSIS — J189 Pneumonia, unspecified organism: Principal | ICD-10-CM | POA: Diagnosis present

## 2012-01-17 DIAGNOSIS — E1149 Type 2 diabetes mellitus with other diabetic neurological complication: Secondary | ICD-10-CM | POA: Diagnosis present

## 2012-01-17 DIAGNOSIS — K120 Recurrent oral aphthae: Secondary | ICD-10-CM

## 2012-01-17 DIAGNOSIS — I248 Other forms of acute ischemic heart disease: Secondary | ICD-10-CM | POA: Diagnosis present

## 2012-01-17 DIAGNOSIS — Z87891 Personal history of nicotine dependence: Secondary | ICD-10-CM

## 2012-01-17 DIAGNOSIS — I214 Non-ST elevation (NSTEMI) myocardial infarction: Secondary | ICD-10-CM | POA: Diagnosis present

## 2012-01-17 DIAGNOSIS — J9610 Chronic respiratory failure, unspecified whether with hypoxia or hypercapnia: Secondary | ICD-10-CM

## 2012-01-17 DIAGNOSIS — D696 Thrombocytopenia, unspecified: Secondary | ICD-10-CM

## 2012-01-17 DIAGNOSIS — J438 Other emphysema: Secondary | ICD-10-CM

## 2012-01-17 DIAGNOSIS — F419 Anxiety disorder, unspecified: Secondary | ICD-10-CM

## 2012-01-17 DIAGNOSIS — E785 Hyperlipidemia, unspecified: Secondary | ICD-10-CM

## 2012-01-17 DIAGNOSIS — Z853 Personal history of malignant neoplasm of breast: Secondary | ICD-10-CM

## 2012-01-17 DIAGNOSIS — J439 Emphysema, unspecified: Secondary | ICD-10-CM | POA: Diagnosis present

## 2012-01-17 DIAGNOSIS — F329 Major depressive disorder, single episode, unspecified: Secondary | ICD-10-CM

## 2012-01-17 DIAGNOSIS — Z9981 Dependence on supplemental oxygen: Secondary | ICD-10-CM

## 2012-01-17 DIAGNOSIS — I2510 Atherosclerotic heart disease of native coronary artery without angina pectoris: Secondary | ICD-10-CM

## 2012-01-17 DIAGNOSIS — I2489 Other forms of acute ischemic heart disease: Secondary | ICD-10-CM | POA: Diagnosis present

## 2012-01-17 DIAGNOSIS — G250 Essential tremor: Secondary | ICD-10-CM

## 2012-01-17 LAB — CBC
HCT: 48.9 % — ABNORMAL HIGH (ref 36.0–46.0)
HCT: 44.1 % (ref 36.0–46.0)
Hemoglobin: 16.8 g/dL — ABNORMAL HIGH (ref 12.0–15.0)
MCH: 33.5 pg (ref 26.0–34.0)
MCHC: 34.4 g/dL (ref 30.0–36.0)
MCV: 96.3 fL (ref 78.0–100.0)
Platelets: 242 10*3/uL (ref 150–400)
RBC: 4.58 MIL/uL (ref 3.87–5.11)
RDW: 12.7 % (ref 11.5–15.5)
WBC: 11.1 10*3/uL — ABNORMAL HIGH (ref 4.0–10.5)

## 2012-01-17 LAB — CREATININE, SERUM
GFR calc Af Amer: 71 mL/min — ABNORMAL LOW (ref >90)
GFR calc non Af Amer: 61 mL/min — ABNORMAL LOW (ref >90)

## 2012-01-17 LAB — CARDIAC PANEL(CRET KIN+CKTOT+MB+TROPI)
CK, MB: 7 ng/mL (ref 0.3–4.0)
Relative Index: 4.6 — ABNORMAL HIGH (ref 0.0–2.5)
Total CK: 151 U/L (ref 7–177)
Troponin I: 1.4 ng/mL (ref <0.30)

## 2012-01-17 LAB — BASIC METABOLIC PANEL
BUN: 19 mg/dL (ref 6–23)
Creatinine, Ser: 0.85 mg/dL (ref 0.50–1.10)
GFR calc Af Amer: 78 mL/min — ABNORMAL LOW (ref >90)
GFR calc non Af Amer: 67 mL/min — ABNORMAL LOW (ref >90)

## 2012-01-17 LAB — DIFFERENTIAL
Basophils Relative: 0 % (ref 0–1)
Eosinophils Absolute: 0 10*3/uL (ref 0.0–0.7)
Monocytes Absolute: 0.8 10*3/uL (ref 0.1–1.0)
Monocytes Relative: 7 % (ref 3–12)
Neutrophils Relative %: 77 % (ref 43–77)

## 2012-01-17 MED ORDER — METHYLPREDNISOLONE SODIUM SUCC 125 MG IJ SOLR
INTRAMUSCULAR | Status: AC
  Administered 2012-01-17: 13:00:00
  Filled 2012-01-17: qty 2

## 2012-01-17 MED ORDER — SIMVASTATIN 20 MG PO TABS
20.0000 mg | ORAL_TABLET | Freq: Every day | ORAL | Status: DC
  Administered 2012-01-18 – 2012-01-19 (×2): 20 mg via ORAL
  Filled 2012-01-17 (×4): qty 1

## 2012-01-17 MED ORDER — LEVOFLOXACIN IN D5W 750 MG/150ML IV SOLN
750.0000 mg | INTRAVENOUS | Status: DC
  Administered 2012-01-17 – 2012-01-19 (×3): 750 mg via INTRAVENOUS
  Filled 2012-01-17 (×4): qty 150

## 2012-01-17 MED ORDER — METHYLPREDNISOLONE SODIUM SUCC 125 MG IJ SOLR
60.0000 mg | Freq: Four times a day (QID) | INTRAMUSCULAR | Status: DC
  Administered 2012-01-17 – 2012-01-19 (×5): 60 mg via INTRAVENOUS
  Filled 2012-01-17: qty 2
  Filled 2012-01-17 (×7): qty 0.96
  Filled 2012-01-17: qty 2
  Filled 2012-01-17: qty 0.96

## 2012-01-17 MED ORDER — FUROSEMIDE 10 MG/ML IJ SOLN
20.0000 mg | Freq: Once | INTRAMUSCULAR | Status: AC
  Administered 2012-01-17: 20 mg via INTRAVENOUS

## 2012-01-17 MED ORDER — CHLORHEXIDINE GLUCONATE 0.12 % MT SOLN
15.0000 mL | Freq: Two times a day (BID) | OROMUCOSAL | Status: DC
  Administered 2012-01-18 – 2012-01-28 (×13): 15 mL via OROMUCOSAL
  Filled 2012-01-17 (×23): qty 15

## 2012-01-17 MED ORDER — POTASSIUM CHLORIDE CRYS ER 20 MEQ PO TBCR
20.0000 meq | EXTENDED_RELEASE_TABLET | Freq: Two times a day (BID) | ORAL | Status: DC
  Administered 2012-01-18 – 2012-01-27 (×19): 20 meq via ORAL
  Filled 2012-01-17 (×21): qty 1

## 2012-01-17 MED ORDER — SODIUM CHLORIDE 0.9 % IV SOLN
250.0000 mL | INTRAVENOUS | Status: DC | PRN
  Administered 2012-01-17: 500 mL via INTRAVENOUS

## 2012-01-17 MED ORDER — ENOXAPARIN SODIUM 100 MG/ML ~~LOC~~ SOLN
85.0000 mg | Freq: Two times a day (BID) | SUBCUTANEOUS | Status: AC
  Administered 2012-01-17 – 2012-01-19 (×5): 85 mg via SUBCUTANEOUS
  Filled 2012-01-17 (×6): qty 1

## 2012-01-17 MED ORDER — FUROSEMIDE 10 MG/ML IJ SOLN
INTRAMUSCULAR | Status: AC
  Filled 2012-01-17: qty 4

## 2012-01-17 MED ORDER — SERTRALINE HCL 50 MG PO TABS
50.0000 mg | ORAL_TABLET | Freq: Every day | ORAL | Status: DC
  Administered 2012-01-18 – 2012-01-24 (×4): 50 mg via ORAL
  Filled 2012-01-17 (×10): qty 1

## 2012-01-17 MED ORDER — ALBUTEROL SULFATE (5 MG/ML) 0.5% IN NEBU
2.5000 mg | INHALATION_SOLUTION | Freq: Four times a day (QID) | RESPIRATORY_TRACT | Status: DC
  Administered 2012-01-18 – 2012-01-28 (×37): 2.5 mg via RESPIRATORY_TRACT
  Filled 2012-01-17 (×41): qty 0.5

## 2012-01-17 MED ORDER — FUROSEMIDE 40 MG PO TABS
40.0000 mg | ORAL_TABLET | Freq: Every day | ORAL | Status: DC
  Administered 2012-01-18 – 2012-01-22 (×5): 40 mg via ORAL
  Filled 2012-01-17 (×6): qty 1

## 2012-01-17 MED ORDER — ALBUTEROL SULFATE (5 MG/ML) 0.5% IN NEBU: 2.5000 mg | INHALATION_SOLUTION | RESPIRATORY_TRACT | Status: DC | PRN

## 2012-01-17 MED ORDER — ALBUTEROL SULFATE (5 MG/ML) 0.5% IN NEBU
INHALATION_SOLUTION | RESPIRATORY_TRACT | Status: AC
  Administered 2012-01-17: 13:00:00
  Filled 2012-01-17: qty 3

## 2012-01-17 MED ORDER — IPRATROPIUM BROMIDE 0.02 % IN SOLN
RESPIRATORY_TRACT | Status: AC
  Administered 2012-01-17: 13:00:00
  Filled 2012-01-17: qty 2.5

## 2012-01-17 MED ORDER — IPRATROPIUM BROMIDE 0.02 % IN SOLN
0.5000 mg | Freq: Four times a day (QID) | RESPIRATORY_TRACT | Status: DC
  Administered 2012-01-18 – 2012-01-28 (×38): 0.5 mg via RESPIRATORY_TRACT
  Filled 2012-01-17 (×40): qty 2.5

## 2012-01-17 MED ORDER — DEXTROSE 5 % IV SOLN
1.0000 g | INTRAVENOUS | Status: DC
  Administered 2012-01-17: 1 g via INTRAVENOUS
  Filled 2012-01-17 (×3): qty 10

## 2012-01-17 MED ORDER — BIOTENE DRY MOUTH MT LIQD
15.0000 mL | Freq: Two times a day (BID) | OROMUCOSAL | Status: DC
  Administered 2012-01-21 – 2012-01-28 (×15): 15 mL via OROMUCOSAL

## 2012-01-17 MED ORDER — SODIUM CHLORIDE 0.9 % IJ SOLN
3.0000 mL | Freq: Two times a day (BID) | INTRAMUSCULAR | Status: DC
  Administered 2012-01-18 – 2012-01-28 (×21): 3 mL via INTRAVENOUS

## 2012-01-17 MED ORDER — PANTOPRAZOLE SODIUM 40 MG PO TBEC
40.0000 mg | DELAYED_RELEASE_TABLET | Freq: Every day | ORAL | Status: DC
  Administered 2012-01-18 – 2012-01-28 (×11): 40 mg via ORAL
  Filled 2012-01-17 (×12): qty 1

## 2012-01-17 MED ORDER — ASPIRIN 81 MG PO CHEW
324.0000 mg | CHEWABLE_TABLET | Freq: Once | ORAL | Status: AC
  Administered 2012-01-17: 324 mg via ORAL
  Filled 2012-01-17: qty 4

## 2012-01-17 MED ORDER — ASPIRIN EC 81 MG PO TBEC
81.0000 mg | DELAYED_RELEASE_TABLET | Freq: Every day | ORAL | Status: DC
  Administered 2012-01-18 – 2012-01-28 (×11): 81 mg via ORAL
  Filled 2012-01-17 (×12): qty 1

## 2012-01-17 MED ORDER — ENOXAPARIN SODIUM 40 MG/0.4ML ~~LOC~~ SOLN: 40.0000 mg | SUBCUTANEOUS | Status: DC

## 2012-01-17 MED ORDER — ASPIRIN 81 MG PO TABS: 81.0000 mg | ORAL_TABLET | Freq: Every day | ORAL | Status: DC

## 2012-01-17 MED ORDER — SODIUM CHLORIDE 0.9 % IJ SOLN
3.0000 mL | INTRAMUSCULAR | Status: DC | PRN
  Administered 2012-01-18 (×2): 3 mL via INTRAVENOUS

## 2012-01-17 NOTE — ED Notes (Signed)
Pt c/o sob x5 days, pt states "I just don't care, I don't want yall to do anything for me, I just want to die, nobody cares about me." Pt reports having children, denies any support from friends or family, pt reports her neighbor called 911 today.

## 2012-01-17 NOTE — ED Notes (Signed)
Unable to call report Nurse will call back

## 2012-01-17 NOTE — Progress Notes (Addendum)
ANTIBIOTIC CONSULT NOTE - INITIAL  Pharmacy Consult for Levaquin, Rocephin Indication: CAP  Allergies  Allergen Reactions  . Codeine     REACTION: makes pt pass out  . Sulfonamide Derivatives     REACTION: edema    Patient Measurements:   Adjusted Body Weight:   Vital Signs: Temp: 101.4 F (38.6 C) (05/17 1326) Temp src: Rectal (05/17 1326) BP: 112/58 mmHg (05/17 1500) Pulse Rate: 86  (05/17 1500) Intake/Output from previous day:   Intake/Output from this shift:    Labs:  Basename 01/17/12 1322  WBC 11.1*  HGB 16.8*  PLT 242  LABCREA --  CREATININE 0.85   The CrCl is unknown because both a height and weight (above a minimum accepted value) are required for this calculation. No results found for this basename: VANCOTROUGH:2,VANCOPEAK:2,VANCORANDOM:2,GENTTROUGH:2,GENTPEAK:2,GENTRANDOM:2,TOBRATROUGH:2,TOBRAPEAK:2,TOBRARND:2,AMIKACINPEAK:2,AMIKACINTROU:2,AMIKACIN:2, in the last 72 hours   Microbiology: No results found for this or any previous visit (from the past 720 hour(s)).  Medical History: Past Medical History  Diagnosis Date  . Breast cancer, right breast 1998    s/p chemo & XRT, right mastectomy  . OBESITY   . CORONARY ARTERY DISEASE   . APHTHOUS STOMATITIS   . DEPRESSION     started sertraline 09/2010  . DIABETES, TYPE 2 dx 03/2010  . HYPERLIPIDEMIA   . Hypothyroidism   . C O P D     chronic O2 3LPM [employer]  . Essential tremor     Medications:  See med rec  Assessment: Patient is a 71 y.o F admitted to the ED with c/o SOB.  To start Abx for suspected PNA.  Levaquin alone should be adequate for the treatment of CAP.    Plan:  1) No change for levaquin and Rocephin dose. 2) Will sign off on Rocephin since no renal adjustment is needed for this medication 3) Consider d/c Rocephin if appropriate.  Pham, Anh P 01/17/2012,5:20 PM  Addum:  Start lovenox 85 mg sq q12 hours for r/o ACS.  Will check CBC q 3 days. Talbert Cage, PharmD

## 2012-01-17 NOTE — ED Notes (Signed)
Per EMS, pt c/o sob x5 days, productive cough w/gray colored sputum, pt removed her first peripheral IV, EMS placed a second IV in (R) hand, 24 g RH, pt received dual neb x3 and Solumedrol 125 mg IVP.

## 2012-01-17 NOTE — ED Provider Notes (Cosign Needed Addendum)
History     CSN: [id number]  Arrival date & time 01/17/12  1240   First MD Initiated Contact with Patient 01/17/12 1252      Chief Complaint  Patient presents with  . Shortness of Breath    (Consider location/radiation/quality/duration/timing/severity/associated sxs/prior treatment) Patient is a 71 y.o. female presenting with shortness of breath. The history is provided by the patient. The history is limited by the condition of the patient.  Shortness of Breath  Associated symptoms include shortness of breath.   the patient is a 71 year old, female, with oxygen-dependent COPD, who presents to emergency department complaining of productive cough, and progressive shortness of breath.  For approximately 5 days.  She denies chest pain.  At any time.  She denies nausea, vomiting, fevers, chills, or sweating.  She denies leg pain or swelling.  Level V caveat applies for severe respiratory distress and urgent need for intervention  Past Medical History  Diagnosis Date  . Breast cancer, right breast 1998    s/p chemo & XRT, right mastectomy  . OBESITY   . CORONARY ARTERY DISEASE   . APHTHOUS STOMATITIS   . DEPRESSION     started sertraline 09/2010  . DIABETES, TYPE 2 dx 03/2010  . HYPERLIPIDEMIA   . Hypothyroidism   . C O P D     chronic O2  . Essential tremor     Past Surgical History  Procedure Date  . Appendectomy   . Cholecystectomy   . Abdominal hysterectomy   . Right mastectomy   . Tonsillectomy     Family History  Problem Relation Age of Onset  . Diabetes Neg Hx   . Cancer Neg Hx     History  Substance Use Topics  . Smoking status: Former Smoker -- 2.5 packs/day for 54 years    Types: Cigarettes    Quit date: 09/02/2006  . Smokeless tobacco: Never Used   Comment: Retired. Widow/widower since 2008-lives alone. Home hospice since 06/2009 related to COPD  . Alcohol Use: No    OB History    Grav Para Term Preterm Abortions TAB SAB Ect Mult Living                    Review of Systems  Unable to perform ROS Respiratory: Positive for shortness of breath.   All other systems reviewed and are negative.    Allergies  Codeine and Sulfonamide derivatives  Home Medications   Current Outpatient Rx  Name Route Sig Dispense Refill  . ALBUTEROL SULFATE HFA 108 (90 BASE) MCG/ACT IN AERS Inhalation Inhale 2 puffs into the lungs every 6 (six) hours as needed for wheezing. 2 Inhaler 3  . ALBUTEROL SULFATE (2.5 MG/3ML) 0.083% IN NEBU Nebulization Take 2.5 mg by nebulization 4 (four) times daily as needed. For shortness of breath    . ALPRAZOLAM 0.5 MG PO TABS Oral Take 1 tablet (0.5 mg total) by mouth 2 (two) times daily. 60 tablet 5  . VITAMIN C 500 MG PO TABS Oral Take 500 mg by mouth daily.      . ASPIRIN 81 MG PO TABS Oral Take 81 mg by mouth daily.      [name] BENZONATATE 100 MG PO CAPS Oral Take 100 mg by mouth every 8 (eight) hours as needed. For cough    . BUDESONIDE-FORMOTEROL FUMARATE 160-4.5 MCG/ACT IN AERO Inhalation Inhale 2 puffs into the lungs 2 (two) times daily. 1 Inhaler 3  . ESOMEPRAZOLE MAGNESIUM 40 MG PO CPDR Oral  Take 40 mg by mouth daily before breakfast.    . FUROSEMIDE 40 MG PO TABS Oral Take 40 mg by mouth daily.    [name] GLIMEPIRIDE 2 MG PO TABS Oral Take 1 mg by mouth 2 (two) times daily. Take 1/2 by mouth every morning if cbg > 150    . ONETOUCH ULTRA BLUE VI In Vitro by In Vitro route. Use two times a day to check blood sugars     . METFORMIN HCL ER 500 MG PO TB24 Oral Take 1 tablet (500 mg total) by mouth 2 (two) times daily. 60 tablet 5  . CENTRUM SILVER PO TABS Oral Take 1 tablet by mouth daily.      [name] FISH OIL 1200 MG PO CAPS Oral Take by mouth 2 (two) times daily.      Letta Pate LANCETS MISC Does not apply by Does not apply route. Use two times a day to check blood sugars      . ONETOUCH ULTRA BLUE VI STRP  CHECK BLOOD SUGAR TWO TIMES A DAY DX: 250.00 50 each 3  . POTASSIUM CHLORIDE CRYS ER 20 MEQ PO TBCR Oral Take 1 tablet (20  mEq total) by mouth 2 (two) times daily. 60 tablet 2  . PRAVASTATIN SODIUM 40 MG PO TABS Oral Take 40 mg by mouth daily.    [name] PREDNISONE 10 MG PO TABS Oral Take 10 mg by mouth daily.    . SENNA-DOCUSATE SODIUM 8.6-50 MG PO TABS Oral Take 1 tablet by mouth at bedtime as needed. For constipation    . SERTRALINE HCL 50 MG PO TABS Oral Take 1 tablet (50 mg total) by mouth daily.    [name] TIOTROPIUM BROMIDE MONOHYDRATE 18 MCG IN CAPS Inhalation Place 1 capsule (18 mcg total) into inhaler and inhale daily. 30 capsule 0  . VITAMIN E 400 UNITS PO CAPS Oral Take 400 Units by mouth 2 (two) times daily.       BP 112/58  Pulse 86  Temp(Src) 101.4 F (38.6 C) (Rectal)  Resp 33  SpO2 95%  Physical Exam  Nursing note and vitals reviewed. Constitutional: She is oriented to person, place, and time. She appears well-developed and well-nourished. She appears distressed.  HENT:  Head: Normocephalic and atraumatic.  Eyes: Conjunctivae and EOM are normal.  Neck: Normal range of motion. Neck supple.  Cardiovascular:  No murmur heard.      Tachycardia  Pulmonary/Chest: She is in respiratory distress. She has no wheezes. She has no rales.  Abdominal: Soft. She exhibits no distension. There is no tenderness.  Musculoskeletal: Normal range of motion. She exhibits no edema and no tenderness.  Neurological: She is alert and oriented to person, place, and time. No cranial nerve deficit.  Skin: Skin is warm. She is diaphoretic.       Diaphoretic  Psychiatric:       Anxious    ED Course  Procedures (including critical care time) 71 year old, female, with COPD, exacerbation, versus pneumonia in severe respiratory distress  Labs Reviewed  CBC - Abnormal; Notable for the following:    WBC 11.1 (*)    Hemoglobin 16.8 (*)    HCT 48.9 (*)    All other components within normal limits  DIFFERENTIAL - Abnormal; Notable for the following:    Neutro Abs 8.5 (*)    All other components within normal limits  BASIC  METABOLIC PANEL - Abnormal; Notable for the following:    Glucose, Bld 208 (*)    GFR  calc non Af Amer 67 (*)    GFR calc Af Amer 78 (*)    All other components within normal limits  PRO B NATRIURETIC PEPTIDE - Abnormal; Notable for the following:    Pro B Natriuretic peptide (BNP) 1047.0 (*)    All other components within normal limits  POCT I-STAT TROPONIN I - Abnormal; Notable for the following:    Troponin i, poc 0.32 (*)    All other components within normal limits  BLOOD GAS, ARTERIAL   Dg Chest Portable 1 View  01/17/2012  *RADIOLOGY REPORT*  Clinical Data:  Shortness of breath, history CHF, COPD, smoking  PORTABLE CHEST - 1 VIEW  Comparison: Portable exam 1330 hours compared to 08/11/2007  Findings: Rotated to the right. Normal heart size and mediastinal contours for rotation. Slight increase in pulmonary vascular interstitial markings versus previous study, cannot exclude minimal edema or infection. No segmental consolidation, pleural effusion or pneumothorax. Pleural thickening or scarring at right apex appears stable. Bones appear diffusely demineralized. Surgical clips right axilla.  IMPRESSION: Increased interstitial markings versus previous exam, question minimal edema or infection.  Original Report Authenticated By: Lollie Marrow, M.D.     No diagnosis found.  3:28 PM sxs improved on bipap.  Pt repeats that never had CP.  3:28 PM  Spoke with dr. Irene Limbo. He will admit  CRITICAL CARE Performed by: Denicia Pagliarulo P   Total critical care time: 30 min  Critical care time was exclusive of separately billable procedures and treating other patients.  Critical care was necessary to treat or prevent imminent or life-threatening deterioration.  Critical care was time spent personally by me on the following activities: development of treatment plan with patient and/or surrogate as well as nursing, discussions with consultants, evaluation of patient's response to treatment,  examination of patient, obtaining history from patient or surrogate, ordering and performing treatments and interventions, ordering and review of laboratory studies, ordering and review of radiographic studies, pulse oximetry and re-evaluation of patient's condition.   MDM  CAP resp distress fever        Cheri Guppy, MD 01/17/12 1528  Cheri Guppy, MD 01/17/12 [phone]

## 2012-01-17 NOTE — ED Notes (Signed)
[id number] Ready

## 2012-01-17 NOTE — H&P (Addendum)
History and Physical  Kaitlin Henderson:[id number] DOB: March 21, 1941 DOA: 01/17/2012  Referring physician: Jannifer Rodney, MD PCP: [name], MD, MD  Pulmonologist:  [name], MD  Chief Complaint: Shortness of breath  HPI:  71 year old woman with history of oxygen-dependent COPD presents the emergency department in acute respiratory failure. Symptoms began 5 days ago with shortness of breath and productive cough. Since onset her symptoms have continued to worsen. Breathing treatments at home provided no relief. Because of her acute respiratory distress she was started on BiPAP in the emergency department. She denies ever having been intubated.  In the emergency department she was noted be febrile with a temperature 101.4, tachypneic with a respiratory rate 30s-50S, tachycardic with a heart rate 67-137 and normotensive. Oxygen saturations in general high 90s on BiPAP. Laboratory studies available for review included an unremarkable basic metabolic panel, troponin 0.32, BNP 1047,  White blood cell count 11.1. Chest x-ray: Edema versus infection. EKG very poor quality and difficult to interpret. Sinus tachycardia. No definite acute changes seen. I have requested antibiotics be given in the emergency department. ABG is pending. The patient does not want to be intubated even if not doing so would result in her death.  Review of Systems:  Negative for new changes to her vision, sore throat, rash, new muscle aches, chest pain, dysuria, bleeding, nausea, vomiting.  Positive for poor oral intake.  Past Medical History  Diagnosis Date  . Breast cancer, right breast 1998    s/p chemo & XRT, right mastectomy  . OBESITY   . CORONARY ARTERY DISEASE   . APHTHOUS STOMATITIS   . DEPRESSION     started sertraline 09/2010  . DIABETES, TYPE 2 dx 03/2010  . HYPERLIPIDEMIA   . Hypothyroidism   . C O P D     chronic O2 3LPM [place]  . Essential tremor    Past Surgical History  Procedure Date    . Appendectomy   . Cholecystectomy   . Abdominal hysterectomy   . Right mastectomy   . Tonsillectomy    Social History:  reports that she quit smoking about 5 years ago. Her smoking use included Cigarettes. She has a 135 pack-year smoking history. She has never used smokeless tobacco. She reports that she does not drink alcohol or use illicit drugs.  Allergies  Allergen Reactions  . Codeine     REACTION: makes pt pass out  . Sulfonamide Derivatives     REACTION: edema   Family History  Problem Relation Age of Onset  . Diabetes Neg Hx   . Cancer Neg Hx    Prior to Admission medications   Medication Sig Start Date End Date Taking? Authorizing Provider  albuterol (PROAIR HFA) 108 (90 BASE) MCG/ACT inhaler Inhale 2 puffs into the lungs every 6 (six) hours as needed for wheezing. 12/04/11 12/03/12 Yes Storm Frisk, MD  albuterol (PROVENTIL) (2.5 MG/3ML) 0.083% nebulizer solution Take 2.5 mg by nebulization 4 (four) times daily as needed.     Yes Historical Provider, MD  ALPRAZolam Prudy Feeler) 0.5 MG tablet Take 1 tablet (0.5 mg total) by mouth 2 (two) times daily. 09/13/11  Yes Newt Lukes, MD  Ascorbic Acid (VITAMIN C) 500 MG tablet Take 500 mg by mouth daily.     Yes Historical Provider, MD  aspirin 81 MG tablet Take 81 mg by mouth daily.     Yes Historical Provider, MD  benzonatate (TESSALON) 100 MG capsule Take 100 mg by mouth every 8 (eight)  hours as needed.    Yes Historical Provider, MD  budesonide-formoterol (SYMBICORT) 160-4.5 MCG/ACT inhaler Inhale 2 puffs into the lungs 2 (two) times daily. 10/07/11 10/06/12 Yes Storm Frisk, MD  furosemide (LASIX) 40 MG tablet TAKE 1 TABLET TWICE DAILY 09/17/11  Yes Storm Frisk, MD  glimepiride (AMARYL) 2 MG tablet Take 1 mg by mouth 2 (two) times daily. Take 1/2 by mouth every morning if cbg > 150   Yes Historical Provider, MD  Glucose Blood (ONETOUCH ULTRA BLUE VI) by In Vitro route. Use two times a day to check blood sugars    Yes  Historical Provider, MD  metFORMIN (GLUCOPHAGE-XR) 500 MG 24 hr tablet Take 1 tablet (500 mg total) by mouth 2 (two) times daily. 11/25/11  Yes Newt Lukes, MD  Multiple Vitamins-Minerals (CENTRUM SILVER) tablet Take 1 tablet by mouth daily.     Yes Historical Provider, MD  potassium chloride SA (KLOR-CON M20) 20 MEQ tablet Take 1 tablet (20 mEq total) by mouth 2 (two) times daily. 01/07/12 01/07/13 Yes Storm Frisk, MD  pravastatin (PRAVACHOL) 40 MG tablet TAKE 1 TABLET BY MOUTH ONCE DAILY 10/12/11  Yes Newt Lukes, MD  predniSONE (DELTASONE) 10 MG tablet TAKE 1 TABLET EVERY DAY 11/06/11  Yes Storm Frisk, MD  NEXIUM 40 MG capsule TAKE 1 CAPSULE BY MOUTH ONCE A DAY 11/22/11   Newt Lukes, MD  Omega-3 Fatty Acids (FISH OIL) 1200 MG CAPS Take by mouth 2 (two) times daily.      Historical Provider, MD  ONE TOUCH LANCETS MISC by Does not apply route. Use two times a day to check blood sugars      Historical Provider, MD  ONE TOUCH ULTRA TEST test strip CHECK BLOOD SUGAR TWO TIMES A DAY DX: 250.00 06/04/11   Newt Lukes, MD  sennosides-docusate sodium (SENOKOT-S) 8.6-50 MG tablet Take 1 tablet by mouth at bedtime as needed.      Historical Provider, MD  sertraline (ZOLOFT) 50 MG tablet Take 1 tablet (50 mg total) by mouth daily. 11/25/11   Newt Lukes, MD  tiotropium (SPIRIVA) 18 MCG inhalation capsule Place 1 capsule (18 mcg total) into inhaler and inhale daily. 10/07/11 10/06/12  Storm Frisk, MD  vitamin E 400 UNIT capsule Take 400 Units by mouth 2 (two) times daily.     Historical Provider, MD   Physical Exam: Filed Vitals:   01/17/12 1415 01/17/12 1430 01/17/12 1445 01/17/12 1500  BP: 121/77 118/60 112/59 112/58  Pulse: 130 56 116 86  Temp:      TempSrc:      Resp: 44 39 36 33  SpO2: 94% 95% 95% 95%    General:  Examined in emergency department. Currently on BiPAP. Respiratory rate 30s. However patient is able to answer questions and provided history and  speak in short sentences. Despite her tachypnea and level of respiratory support she appears nontoxic.  Eyes: Pupils equal, round, react to light. Lids appear unremarkable.  ENT: Grossly normal hearing. Mucous membranes dry. Lips and tongue appear unremarkable.  Neck: No lymphadenopathy or masses. No thyromegaly.  Cardiovascular: Tachycardic, distant. No murmur, rub, gallop appreciated. No significant lower extremity edema.  Respiratory: Decreased breath sounds bilaterally. Tachypneic and dipstick. Moderate increased work of breathing.  Abdomen: Soft, nontender, nondistended.  Skin: No rash or induration seen.  Musculoskeletal: Appears to be grossly unremarkable.  Psychiatric: Mostly normal tone and strength.  Labs on Admission:  Basic Metabolic Panel:  Lab  01/17/12 1322  NA 139  K 4.8  CL 96  CO2 20  GLUCOSE 208*  BUN 19  CREATININE 0.85  CALCIUM 10.0  MG --  PHOS --   CBC:  Lab 01/17/12 1322  WBC 11.1*  NEUTROABS 8.5*  HGB 16.8*  HCT 48.9*  MCV 97.4  PLT 242   Cardiac Enzymes: Troponin (Point of Care Test)  Basename 01/17/12 1422  TROPIPOC 0.32*   BNP:    Component Value Date/Time   PROBNP 1047.0* 01/17/2012 1418   Radiological Exams on Admission: Dg Chest Portable 1 View  01/17/2012  *RADIOLOGY REPORT*  Clinical Data:  Shortness of breath, history CHF, COPD, smoking  PORTABLE CHEST - 1 VIEW  Comparison: Portable exam 1330 hours compared to 08/11/2007  Findings: Rotated to the right. Normal heart size and mediastinal contours for rotation. Slight increase in pulmonary vascular interstitial markings versus previous study, cannot exclude minimal edema or infection. No segmental consolidation, pleural effusion or pneumothorax. Pleural thickening or scarring at right apex appears stable. Bones appear diffusely demineralized. Surgical clips right axilla.  IMPRESSION: Increased interstitial markings versus previous exam, question minimal edema or infection.   Original Report Authenticated By: Lollie Marrow, M.D.   EKG: Independently reviewed. As recorded in the history of present illness.  Assessment/Plan Principal Problem:  *Acute respiratory failure Active Problems:  C O P D  Community acquired pneumonia  COPD exacerbation  Elevated troponin  DIABETES, TYPE 2  1. Acute respiratory failure: Clinically and subjectively improved on BiPAP. Treat empirically for pneumonia and COPD exacerbation. No reason to suspect MRSA or pseudomonas. Continue BiPAP. Patient does not want to be intubated. 2. Community acquired pneumonia: Antibiotics. 3. COPD exacerbation/oxygen-dependent COPD: Steroids, nebulizer treatments, supplemental oxygen. 4. Elevated troponin: Asymptomatic. Most likely reflects strain rather than acute coronary syndrome. Cycle cardiac enzymes. 5. Elevated BNP: Only echocardiogram on file is from 2008 at that time had normal systolic function. I suspect her elevated BNP reflects strain rather than actual heart failure or volume overload. She appears clinically euvolemic. Monitor clinically. 6. Diabetes mellitus type 2: Appears stable.  Patient appears critically ill. Prognosis is guarded however she has responded to treatment in the emergency department. Admit to step down. If condition worsens consider palliative care/comfort care. However at this point she seems to be improved with BiPAP. Hopefully can wean off BiPAP soon.  Addendum 1800: Troponin 1.40, CK-MB 7.0. Patient remains asymptomatic and clinically continues to improve. Suspect NSTEMI type 2 secondary to strain rather than primary MI. Aspirin. Lovenox. Not a candidate for beta blocker secondary to COPD. Not an interventional candidate. Continue conservative management.  Code Status: DO NOT RESUSCITATE/DO NOT INTUBATE Family Communication: None bedside Disposition Plan: Pending further evaluation treatment.  [name], MD  [facility] Pager [phone]  If 8PM-8AM,  please contact floor/night-coverage [email or web address] Password [facility] 01/17/2012, 3:13 PM

## 2012-01-18 DIAGNOSIS — H109 Unspecified conjunctivitis: Secondary | ICD-10-CM | POA: Diagnosis present

## 2012-01-18 DIAGNOSIS — J984 Other disorders of lung: Secondary | ICD-10-CM

## 2012-01-18 DIAGNOSIS — I214 Non-ST elevation (NSTEMI) myocardial infarction: Secondary | ICD-10-CM

## 2012-01-18 LAB — EXPECTORATED SPUTUM ASSESSMENT W GRAM STAIN, RFLX TO RESP C

## 2012-01-18 LAB — GLUCOSE, CAPILLARY: Glucose-Capillary: 187 mg/dL — ABNORMAL HIGH (ref 70–99)

## 2012-01-18 LAB — CBC
HCT: 44 % (ref 36.0–46.0)
Hemoglobin: 15.1 g/dL — ABNORMAL HIGH (ref 12.0–15.0)
MCH: 33.5 pg (ref 26.0–34.0)
MCHC: 34.3 g/dL (ref 30.0–36.0)
RBC: 4.51 MIL/uL (ref 3.87–5.11)

## 2012-01-18 LAB — CARDIAC PANEL(CRET KIN+CKTOT+MB+TROPI)
CK, MB: 14.9 ng/mL (ref 0.3–4.0)
Total CK: 210 U/L — ABNORMAL HIGH (ref 7–177)
Troponin I: 2.05 ng/mL (ref <0.30)

## 2012-01-18 LAB — MRSA PCR SCREENING: MRSA by PCR: NEGATIVE

## 2012-01-18 LAB — BASIC METABOLIC PANEL
Calcium: 9.9 mg/dL (ref 8.4–10.5)
Creatinine, Ser: 0.99 mg/dL (ref 0.50–1.10)
GFR calc Af Amer: 65 mL/min — ABNORMAL LOW (ref >90)
Sodium: 141 mEq/L (ref 135–145)

## 2012-01-18 MED ORDER — POLYMYXIN B-TRIMETHOPRIM 10000-0.1 UNIT/ML-% OP SOLN: 1.0000 [drp] | OPHTHALMIC | Status: DC

## 2012-01-18 MED ORDER — LORAZEPAM 2 MG/ML IJ SOLN
0.5000 mg | INTRAMUSCULAR | Status: DC | PRN
  Administered 2012-01-18: 0.5 mg via INTRAVENOUS
  Filled 2012-01-18: qty 1

## 2012-01-18 MED ORDER — BACITRACIN-POLYMYXIN B 500-10000 UNIT/GM OP OINT
1.0000 "application " | TOPICAL_OINTMENT | OPHTHALMIC | Status: AC
  Administered 2012-01-18 – 2012-01-25 (×32): 1 via OPHTHALMIC
  Filled 2012-01-18 (×3): qty 3.5

## 2012-01-18 MED ORDER — INSULIN ASPART 100 UNIT/ML ~~LOC~~ SOLN
0.0000 [IU] | Freq: Three times a day (TID) | SUBCUTANEOUS | Status: DC
  Administered 2012-01-18 – 2012-01-19 (×3): 3 [IU] via SUBCUTANEOUS

## 2012-01-18 NOTE — Progress Notes (Signed)
Patient's medications hand delivered to pharmacy.  Receipt for home medications stored in pharmacy is in front of patient's chart.    Vivi Martens RN

## 2012-01-18 NOTE — Progress Notes (Addendum)
[facility] PROGRESS NOTE  ALSIE YOUNES ZOX:[id number] DOB: August 27, 1941 DOA: 01/17/2012 PCP: [name], MD, MD Pulmonologist: [name], MD--notified of admission  Assessment/Plan: 1. Acute respiratory failure: Improved on BiPAP. Trial off BiPAP today. Continue empiric treatment for pneumonia and COPD exacerbation. No reason to suspect MRSA or pseudomonas.  Consider BiPAP this evening. 2. Community acquired pneumonia: Continue empiric treatment. 3. COPD exacerbation/oxygen-dependent COPD: Steroids, nebulizer treatments, supplemental oxygen. 4. Non-ST elevation MI: Remains asymptomatic. Most likely reflects strain rather than acute coronary syndrome/primary event. Trend cardiac enzymes. Lovenox. Aspirin. No beta blocker secondary to COPD. Conservative management. Not an interventional candidate. 5. Elevated BNP: Check 2-D echocardiogram. Appears to be euvolemic. No clinical evidence of decompensated heart failure or volume overload. Previous echocardiogram on file is from 2008 at that time had normal systolic function. 6. Possible conjunctivitis left eye: Empiric ophthalmic drops. 7. Diabetes mellitus type 2: Appears stable. Sliding scale insulin. Hold metformin while hospitalized. Resume Amaryl if remains stable off BiPAP.  Overall improvement. Rapid step down today. If remains stable off BiPAP anticipate transfer to medical floor May 19.  Code Status: DO NOT RESUSCITATE/DO NOT INTUBATE  Family Communication: None bedside  Disposition Plan: Home when improved.  [name], MD  [facility] Pager [phone]. If 8PM-8AM, please contact night-coverage at [email or web address], password [facility] 01/18/2012, 8:36 AM  LOS: 1 day   Brief narrative: 71 year old woman with history of oxygen-dependent COPD presented with acute respiratory failure, pneumonia, COPD exacerbation, non-ST elevation MI, type 2.  Consultants:  None  Procedures:  2-D echocardiogram  pending  Antibiotics:  Rocephin May 17-  Levaquin May 17-  HPI/Subjective: Afebrile since admission. Respiratory rate decreased into the low 20s. Hemodynamically stable. Troponin elevated to 2.05. Patient slept well. She has no complaints. No pain. She wants the.  Objective: Filed Vitals:   01/18/12 0300 01/18/12 0402 01/18/12 0425 01/18/12 0700  BP:  107/68 107/68 115/66  Pulse: 108 92 87   Temp:  97.6 F (36.4 C)  97.3 F (36.3 C)  TempSrc:  Oral  Oral  Resp: 24 25 22    Height:      Weight:      SpO2: 96% 96% 97%     Intake/Output Summary (Last 24 hours) at 01/18/12 0836 Last data filed at 01/18/12 0403  Gross per 24 hour  Intake     50 ml  Output    575 ml  Net   -525 ml    Exam:   General:  Appears calm and comfortable. No obvious dyspnea on nasal cannula. Remains tachypneic. Nontoxic.  Eyes: Right pupil round and reactive normal lids and sclera. Left eye matted, sclera white, pupil appears unremarkable.  ENT: Grossly normal hearing. Lips appear unremarkable.  Cardiovascular: Regular rate and rhythm. No murmur, rub, gallop. No lower extremity edema.  Respiratory: Clear to auscultation bilaterally. Fair air movement. No frank wheezes, rales, rhonchi. Tachypneic in moderate increased work of breathing.  Skin: Appears unremarkable.  Musculoskeletal: Appears unremarkable.  Psychiatric: Grossly normal mood and affect. Speech fluent and appropriate.  Data Reviewed: Basic Metabolic Panel:  Lab 01/18/12 8295 01/17/12 2152 01/17/12 1322  NA 141 -- 139  K 4.0 -- 4.8  CL 98 -- 96  CO2 25 -- 20  GLUCOSE 199* -- 208*  BUN 26* -- 19  CREATININE 0.99 0.92 0.85  CALCIUM 9.9 -- 10.0  MG -- -- --  PHOS -- -- --   CBC:  Lab 01/18/12 0405 01/17/12 2152 01/17/12 1322  WBC 10.3 11.1*  11.1*  NEUTROABS -- -- 8.5*  HGB 15.1* 15.7* 16.8*  HCT 44.0 44.1 48.9*  MCV 97.6 96.3 97.4  PLT 252 242 242   Cardiac Enzymes:  Lab 01/17/12 2345 01/17/12 1711  CKTOTAL  229* 151  CKMB 13.1* 7.0*  CKMBINDEX -- --  TROPONINI 2.05* 1.40*   BNP (last 3 results)  Basename 01/17/12 1418  PROBNP 1047.0*   CBG:  Lab 01/18/12 0329 01/17/12 2316  GLUCAP 187* 197*    Recent Results (from the past 240 hour(s))  MRSA PCR SCREENING     Status: Normal   Collection Time   01/18/12  3:43 AM      Component Value Range Status Comment   MRSA by PCR NEGATIVE  NEGATIVE  Final     Studies: Dg Chest Portable 1 View  01/17/2012  *RADIOLOGY REPORT*  Clinical Data:  Shortness of breath, history CHF, COPD, smoking  PORTABLE CHEST - 1 VIEW  Comparison: Portable exam 1330 hours compared to 08/11/2007  Findings: Rotated to the right. Normal heart size and mediastinal contours for rotation. Slight increase in pulmonary vascular interstitial markings versus previous study, cannot exclude minimal edema or infection. No segmental consolidation, pleural effusion or pneumothorax. Pleural thickening or scarring at right apex appears stable. Bones appear diffusely demineralized. Surgical clips right axilla.  IMPRESSION: Increased interstitial markings versus previous exam, question minimal edema or infection.  Original Report Authenticated By: Lollie Marrow, M.D.   Scheduled Meds:   . ipratropium  0.5 mg Nebulization Q6H   And  . albuterol  2.5 mg Nebulization Q6H  . albuterol      . antiseptic oral rinse  15 mL Mouth Rinse q12n4p  . aspirin  324 mg Oral Once  . aspirin EC  81 mg Oral Daily  . cefTRIAXone (ROCEPHIN)  IV  1 g Intravenous Q24H  . chlorhexidine  15 mL Mouth/Throat BID  . enoxaparin (LOVENOX) injection  85 mg Subcutaneous Q12H  . furosemide      . furosemide  20 mg Intravenous Once  . furosemide  40 mg Oral Daily  . ipratropium      . levofloxacin (LEVAQUIN) IV  750 mg Intravenous Q24H  . methylPREDNISolone (SOLU-MEDROL) injection  60 mg Intravenous Q6H  . methylPREDNISolone sodium succinate      . pantoprazole  40 mg Oral Daily  . potassium chloride SA  20 mEq  Oral BID  . sertraline  50 mg Oral Daily  . simvastatin  20 mg Oral q1800  . sodium chloride  3 mL Intravenous Q12H  . DISCONTD: aspirin  81 mg Oral Daily  . DISCONTD: enoxaparin  40 mg Subcutaneous Q24H   Continuous Infusions:   Principal Problem:  *Acute respiratory failure Active Problems:  C O P D  Community acquired pneumonia  COPD exacerbation  NSTEMI (non-ST elevated myocardial infarction)  DIABETES, TYPE 2  Conjunctivitis of left eye

## 2012-01-18 NOTE — Plan of Care (Signed)
Problem: Phase I Progression Outcomes Goal: Voiding-avoid urinary catheter unless indicated Outcome: Not Met (add Reason) Patient has a foley placed due to patient getting lasix to pull excess fluid off of her.

## 2012-01-18 NOTE — Progress Notes (Signed)
  Echocardiogram 2D Echocardiogram has been performed.  [place], Real Cons 01/18/2012, 11:12 AM

## 2012-01-19 LAB — LIPID PANEL
Cholesterol: 209 mg/dL — ABNORMAL HIGH (ref 0–200)
HDL: 43 mg/dL (ref >39)
Triglycerides: 154 mg/dL — ABNORMAL HIGH (ref <150)
VLDL: 31 mg/dL (ref 0–40)

## 2012-01-19 LAB — CARDIAC PANEL(CRET KIN+CKTOT+MB+TROPI): Troponin I: 1.86 ng/mL (ref <0.30)

## 2012-01-19 LAB — HEMOGLOBIN A1C
Hgb A1c MFr Bld: 6.5 % — ABNORMAL HIGH (ref <5.7)
Mean Plasma Glucose: 140 mg/dL — ABNORMAL HIGH (ref <117)

## 2012-01-19 LAB — GLUCOSE, CAPILLARY: Glucose-Capillary: 227 mg/dL — ABNORMAL HIGH (ref 70–99)

## 2012-01-19 MED ORDER — GUAIFENESIN-DM 100-10 MG/5ML PO SYRP
5.0000 mL | ORAL_SOLUTION | ORAL | Status: DC | PRN
  Filled 2012-01-19: qty 5

## 2012-01-19 MED ORDER — ENOXAPARIN SODIUM 40 MG/0.4ML ~~LOC~~ SOLN
40.0000 mg | SUBCUTANEOUS | Status: DC
  Administered 2012-01-21 – 2012-01-27 (×7): 40 mg via SUBCUTANEOUS
  Filled 2012-01-19 (×9): qty 0.4

## 2012-01-19 MED ORDER — GLIMEPIRIDE 1 MG PO TABS
1.0000 mg | ORAL_TABLET | Freq: Two times a day (BID) | ORAL | Status: DC
  Administered 2012-01-19 – 2012-01-28 (×18): 1 mg via ORAL
  Filled 2012-01-19 (×22): qty 1

## 2012-01-19 MED ORDER — METHYLPREDNISOLONE SODIUM SUCC 125 MG IJ SOLR
60.0000 mg | Freq: Two times a day (BID) | INTRAMUSCULAR | Status: DC
  Administered 2012-01-19 – 2012-01-20 (×2): 60 mg via INTRAVENOUS
  Filled 2012-01-19 (×2): qty 0.96

## 2012-01-19 MED ORDER — ALPRAZOLAM 0.5 MG PO TABS
0.5000 mg | ORAL_TABLET | Freq: Two times a day (BID) | ORAL | Status: DC | PRN
  Administered 2012-01-19 – 2012-01-22 (×4): 0.5 mg via ORAL
  Filled 2012-01-19 (×4): qty 1

## 2012-01-19 MED ORDER — INSULIN ASPART 100 UNIT/ML ~~LOC~~ SOLN
0.0000 [IU] | Freq: Three times a day (TID) | SUBCUTANEOUS | Status: DC
  Administered 2012-01-19: 3 [IU] via SUBCUTANEOUS
  Administered 2012-01-19: 5 [IU] via SUBCUTANEOUS
  Administered 2012-01-20: 3 [IU] via SUBCUTANEOUS
  Administered 2012-01-20: 8 [IU] via SUBCUTANEOUS
  Administered 2012-01-21: 2 [IU] via SUBCUTANEOUS
  Administered 2012-01-21: 8 [IU] via SUBCUTANEOUS
  Administered 2012-01-21: 5 [IU] via SUBCUTANEOUS
  Administered 2012-01-22 (×2): 8 [IU] via SUBCUTANEOUS
  Administered 2012-01-23: 3 [IU] via SUBCUTANEOUS
  Administered 2012-01-23: 5 [IU] via SUBCUTANEOUS
  Administered 2012-01-24: 2 [IU] via SUBCUTANEOUS
  Administered 2012-01-24 (×2): 5 [IU] via SUBCUTANEOUS
  Administered 2012-01-25 (×2): 8 [IU] via SUBCUTANEOUS
  Administered 2012-01-26 (×2): 5 [IU] via SUBCUTANEOUS
  Administered 2012-01-27: 3 [IU] via SUBCUTANEOUS
  Administered 2012-01-27 (×2): 5 [IU] via SUBCUTANEOUS
  Administered 2012-01-28: 3 [IU] via SUBCUTANEOUS

## 2012-01-19 NOTE — Progress Notes (Signed)
[facility] PROGRESS NOTE  Kaitlin Henderson ZOX:[id number] DOB: 1940-09-10 DOA: 01/17/2012 PCP: [name], MD, MD Pulmonologist: [name], MD--notified of admission  Assessment/Plan: 1. Acute respiratory failure: Resolved and stable off BiPAP. Continue empiric treatment for pneumonia and COPD exacerbation. No reason to suspect MRSA or pseudomonas.   2. Community acquired pneumonia: Rapidly improving. Narrow antibiotics to monotherapy. 3. COPD exacerbation/oxygen-dependent COPD: Improving. Decrease steroids; continue nebulizer treatments, supplemental oxygen. 4. Non-ST elevation MI: Asymptomatic. Most likely reflects strain rather than acute coronary syndrome/primary event.  Complete 48 hours of therapeutic Lovenox this evening. Continue aspirin. No beta blocker secondary to COPD. Conservative management. Not an interventional candidate. 5. Elevated BNP: 2-D echocardiogram revealed diastolic dysfunction. Appears to be euvolemic. No clinical evidence of decompensated heart failure or volume overload. Continue chronic oral Lasix. 6. Possible conjunctivitis left eye: Much improved. Continue empiric ophthalmic drops. 7. Diabetes mellitus type 2: Poor control. Increase sliding scale insulin. Hold metformin while hospitalized. Resume Amaryl.  Much improved. Transfer to telemetry. Mobilize. Home 24-48 hours.  Code Status: DO NOT RESUSCITATE/DO NOT INTUBATE  Family Communication: None bedside  Disposition Plan: Home when improved.  [name], MD  [facility] Pager [phone]. If 8PM-8AM, please contact night-coverage at [email or web address], password [facility] 01/19/2012, 8:38 AM  LOS: 2 days   Brief narrative: 71 year old woman with history of oxygen-dependent COPD presented with acute respiratory failure, pneumonia, COPD exacerbation, non-ST elevation MI, type 2.  Consultants:  None  Procedures:  11/18/2011 2-D echocardiogram: Left ventricular ejection fraction  60-65%. Normal wall motion. Grade 1 diastolic dysfunction.  Antibiotics:  Rocephin May 17-18  Levaquin May 17-24  HPI/Subjective: Remains afebrile, normotensive. Oxygenation stable just above baseline requirement. Blood cultures no growth today. Sputum culture pending. Capillary blood sugars high. Troponin trending down. Feels better. No pain.  Objective: Filed Vitals:   01/19/12 0218 01/19/12 0300 01/19/12 0400 01/19/12 0812  BP:  140/80  131/71  Pulse:  110 106   Temp:  97.7 F (36.5 C)  98.5 F (36.9 C)  TempSrc:  Oral  Oral  Resp:  26 22   Height:      Weight:      SpO2: 91% 91% 90%     Intake/Output Summary (Last 24 hours) at 01/19/12 0838 Last data filed at 01/19/12 0800  Gross per 24 hour  Intake    303 ml  Output   1050 ml  Net   -747 ml    Exam:   General:  Appears calm and comfortable. No obvious dyspnea on nasal cannula. Nontoxic.  Eyes: Left eye no longer; matted, sclera mildly injected.  ENT: Grossly normal hearing.  Cardiovascular: Regular rate and rhythm. No murmur, rub, gallop. No lower extremity edema.  Respiratory: Clear to auscultation bilaterally. Fair air movement. No frank wheezes, rales, rhonchi. Decreased respiratory effort compared to yesterday. Speaks in full sentences.  Psychiatric: Grossly normal mood and affect. Speech fluent and appropriate.  Data Reviewed: Basic Metabolic Panel:  Lab 01/18/12 8295 01/17/12 2152 01/17/12 1322  NA 141 -- 139  K 4.0 -- 4.8  CL 98 -- 96  CO2 25 -- 20  GLUCOSE 199* -- 208*  BUN 26* -- 19  CREATININE 0.99 0.92 0.85  CALCIUM 9.9 -- 10.0  MG -- -- --  PHOS -- -- --   CBC:  Lab 01/18/12 0405 01/17/12 2152 01/17/12 1322  WBC 10.3 11.1* 11.1*  NEUTROABS -- -- 8.5*  HGB 15.1* 15.7* 16.8*  HCT 44.0 44.1 48.9*  MCV 97.6 96.3 97.4  PLT 252 242 242   Cardiac Enzymes:  Lab 01/19/12 0452 01/18/12 0827 01/17/12 2345 01/17/12 1711  CKTOTAL 198* 210* 229* 151  CKMB 12.6* 14.9* 13.1* 7.0*    CKMBINDEX -- -- -- --  TROPONINI 1.86* 2.48* 2.05* 1.40*   BNP (last 3 results)  Basename 01/17/12 1418  PROBNP 1047.0*   CBG:  Lab 01/18/12 2204 01/18/12 1752 01/18/12 1247 01/18/12 0329 01/17/12 2316  GLUCAP 254* 219* 206* 187* 197*    Recent Results (from the past 240 hour(s))  CULTURE, BLOOD (ROUTINE X 2)     Status: Normal (Preliminary result)   Collection Time   01/17/12  5:00 PM      Component Value Range Status Comment   Specimen Description BLOOD ARM LEFT   Final    Special Requests BOTTLES DRAWN AEROBIC AND ANAEROBIC 10CC   Final    Culture  Setup Time [id number]   Final    Culture     Final    Value:        BLOOD CULTURE RECEIVED NO GROWTH TO DATE CULTURE WILL BE HELD FOR 5 DAYS BEFORE ISSUING A FINAL NEGATIVE REPORT   Report Status PENDING   Incomplete   CULTURE, BLOOD (ROUTINE X 2)     Status: Normal (Preliminary result)   Collection Time   01/17/12  5:18 PM      Component Value Range Status Comment   Specimen Description BLOOD ARM RIGHT   Final    Special Requests BOTTLES DRAWN AEROBIC ONLY 10CC   Final    Culture  Setup Time [id number]   Final    Culture     Final    Value:        BLOOD CULTURE RECEIVED NO GROWTH TO DATE CULTURE WILL BE HELD FOR 5 DAYS BEFORE ISSUING A FINAL NEGATIVE REPORT   Report Status PENDING   Incomplete   MRSA PCR SCREENING     Status: Normal   Collection Time   01/18/12  3:43 AM      Component Value Range Status Comment   MRSA by PCR NEGATIVE  NEGATIVE  Final   CULTURE, EXPECTORATED SPUTUM-ASSESSMENT     Status: Normal   Collection Time   01/18/12 11:00 AM      Component Value Range Status Comment   Specimen Description SPUTUM   Final    Special Requests NONE   Final    Sputum evaluation     Final    Value: THIS SPECIMEN IS ACCEPTABLE. RESPIRATORY CULTURE REPORT TO FOLLOW.   Report Status 01/18/2012 FINAL   Final     Studies: Dg Chest Portable 1 View  01/17/2012  *RADIOLOGY REPORT*  Clinical Data:  Shortness of breath,  history CHF, COPD, smoking  PORTABLE CHEST - 1 VIEW  Comparison: Portable exam 1330 hours compared to 08/11/2007  Findings: Rotated to the right. Normal heart size and mediastinal contours for rotation. Slight increase in pulmonary vascular interstitial markings versus previous study, cannot exclude minimal edema or infection. No segmental consolidation, pleural effusion or pneumothorax. Pleural thickening or scarring at right apex appears stable. Bones appear diffusely demineralized. Surgical clips right axilla.  IMPRESSION: Increased interstitial markings versus previous exam, question minimal edema or infection.  Original Report Authenticated By: Lollie Marrow, M.D.   Scheduled Meds:    . ipratropium  0.5 mg Nebulization Q6H   And  . albuterol  2.5 mg Nebulization Q6H  . antiseptic oral rinse  15 mL Mouth Rinse q12n4p  . aspirin  EC  81 mg Oral Daily  . bacitracin-polymyxin b  1 application Left Eye Q4H  . cefTRIAXone (ROCEPHIN)  IV  1 g Intravenous Q24H  . chlorhexidine  15 mL Mouth/Throat BID  . enoxaparin (LOVENOX) injection  85 mg Subcutaneous Q12H  . furosemide      . furosemide  40 mg Oral Daily  . insulin aspart  0-9 Units Subcutaneous TID WC  . levofloxacin (LEVAQUIN) IV  750 mg Intravenous Q24H  . methylPREDNISolone (SOLU-MEDROL) injection  60 mg Intravenous Q6H  . pantoprazole  40 mg Oral Daily  . potassium chloride SA  20 mEq Oral BID  . sertraline  50 mg Oral Daily  . simvastatin  20 mg Oral q1800  . sodium chloride  3 mL Intravenous Q12H  . DISCONTD: trimethoprim-polymyxin b  1 drop Left Eye Q4H   Continuous Infusions:   Principal Problem:  *Acute respiratory failure Active Problems:  C O P D  Community acquired pneumonia  COPD exacerbation  NSTEMI (non-ST elevated myocardial infarction)  DIABETES, TYPE 2  Conjunctivitis of left eye

## 2012-01-19 NOTE — Progress Notes (Signed)
Pt. Has been transferred to room 4736 via wheelchair. Phone report called to [place], Charity fundraiser. Patient made aware of transfer as well as new room number. Writer offered to call patient's family about room change, but she didn't want [place] to notify them.

## 2012-01-20 DIAGNOSIS — D696 Thrombocytopenia, unspecified: Secondary | ICD-10-CM

## 2012-01-20 DIAGNOSIS — J441 Chronic obstructive pulmonary disease with (acute) exacerbation: Secondary | ICD-10-CM

## 2012-01-20 DIAGNOSIS — J449 Chronic obstructive pulmonary disease, unspecified: Secondary | ICD-10-CM

## 2012-01-20 DIAGNOSIS — J189 Pneumonia, unspecified organism: Principal | ICD-10-CM

## 2012-01-20 DIAGNOSIS — J438 Other emphysema: Secondary | ICD-10-CM

## 2012-01-20 DIAGNOSIS — J9600 Acute respiratory failure, unspecified whether with hypoxia or hypercapnia: Secondary | ICD-10-CM

## 2012-01-20 DIAGNOSIS — I214 Non-ST elevation (NSTEMI) myocardial infarction: Secondary | ICD-10-CM

## 2012-01-20 DIAGNOSIS — J9610 Chronic respiratory failure, unspecified whether with hypoxia or hypercapnia: Secondary | ICD-10-CM

## 2012-01-20 LAB — CULTURE, RESPIRATORY W GRAM STAIN

## 2012-01-20 LAB — GLUCOSE, CAPILLARY
Glucose-Capillary: 239 mg/dL — ABNORMAL HIGH (ref 70–99)
Glucose-Capillary: 171 mg/dL — ABNORMAL HIGH (ref 70–99)

## 2012-01-20 MED ORDER — BUDESONIDE 0.25 MG/2ML IN SUSP
0.2500 mg | Freq: Four times a day (QID) | RESPIRATORY_TRACT | Status: DC
  Administered 2012-01-20 – 2012-01-23 (×10): 0.25 mg via RESPIRATORY_TRACT
  Filled 2012-01-20 (×16): qty 2

## 2012-01-20 MED ORDER — PREDNISONE 20 MG PO TABS
40.0000 mg | ORAL_TABLET | Freq: Every day | ORAL | Status: DC
  Administered 2012-01-21 – 2012-01-23 (×3): 40 mg via ORAL
  Filled 2012-01-20 (×4): qty 2

## 2012-01-20 MED ORDER — LEVOFLOXACIN 750 MG PO TABS
750.0000 mg | ORAL_TABLET | ORAL | Status: DC
  Administered 2012-01-21 – 2012-01-27 (×7): 750 mg via ORAL
  Filled 2012-01-20 (×9): qty 1

## 2012-01-20 NOTE — Consult Note (Signed)
Name: Kaitlin Henderson MRN: [id number] DOB: [birth year]    LOS: 3  History of Present Illness:  71 year old woman with history of oxygen-dependent COPD presents the emergency department in acute respiratory failure on 5/17.  Dx eval was consistent w/ CAP and AECOPD. She was admitted by IM service, initially required NIPPV. Additional treatment included systemic steroids, empiric antibiotics, bronchodilators and supportive care. Her acute illness appears to have been complicated by demand ischemia as a result of her acute illness. She has responded well to the above treatment and interventions. We have been notified of her admission as we follow her in the out-patient setting, and asked to assist w/ discharge planning as it appears she should be ready for d/c on 5/21.  Cultures: BC X 2 5/17>>> Sputum  5/18: rare gpc pairs and rare gpr>>> Antibiotics: levaquin 5/17>>>  Tests / Events: Echo 5/18: Left ventricle: The cavity size was normal. Wall thickness was normal. Systolic function was normal. The estimated  ejection fraction was in the range of 60% to 65%. Wall motion was normal; there were no regional wall motion abnormalities. Doppler parameters are consistent with abnormal left ventricular relaxation (grade 1 diastolic dysfunction).   Past Medical History  Diagnosis Date  . Breast cancer, right breast 1998    s/p chemo & XRT, right mastectomy  . OBESITY   . CORONARY ARTERY DISEASE   . APHTHOUS STOMATITIS   . DEPRESSION     started sertraline 09/2010  . DIABETES, TYPE 2 dx 03/2010  . HYPERLIPIDEMIA   . Hypothyroidism   . C O P D     chronic O2 3LPM [place]  . Essential tremor    Past Surgical History  Procedure Date  . Appendectomy   . Cholecystectomy   . Abdominal hysterectomy   . Right mastectomy   . Tonsillectomy    Prior to Admission medications   Medication Sig Start Date End Date Taking? Authorizing Provider  albuterol (PROAIR HFA) 108 (90 BASE) MCG/ACT inhaler  Inhale 2 puffs into the lungs every 6 (six) hours as needed for wheezing. 12/04/11 12/03/12 Yes Storm Frisk, MD  albuterol (PROVENTIL) (2.5 MG/3ML) 0.083% nebulizer solution Take 2.5 mg by nebulization 4 (four) times daily as needed. For shortness of breath   Yes Historical Provider, MD  ALPRAZolam (XANAX) 0.5 MG tablet Take 1 tablet (0.5 mg total) by mouth 2 (two) times daily. 09/13/11  Yes Newt Lukes, MD  Ascorbic Acid (VITAMIN C) 500 MG tablet Take 500 mg by mouth daily.     Yes Historical Provider, MD  aspirin 81 MG tablet Take 81 mg by mouth daily.     Yes Historical Provider, MD  benzonatate (TESSALON) 100 MG capsule Take 100 mg by mouth every 8 (eight) hours as needed. For cough   Yes Historical Provider, MD  budesonide-formoterol (SYMBICORT) 160-4.5 MCG/ACT inhaler Inhale 2 puffs into the lungs 2 (two) times daily. 10/07/11 10/06/12 Yes Storm Frisk, MD  esomeprazole (NEXIUM) 40 MG capsule Take 40 mg by mouth daily before breakfast.   Yes Historical Provider, MD  furosemide (LASIX) 40 MG tablet Take 40 mg by mouth daily.   Yes Historical Provider, MD  glimepiride (AMARYL) 2 MG tablet Take 1 mg by mouth 2 (two) times daily. Take 1/2 by mouth every morning if cbg > 150   Yes Historical Provider, MD  Glucose Blood (ONETOUCH ULTRA BLUE VI) by In Vitro route. Use two times a day to check blood sugars  Yes Historical Provider, MD  metFORMIN (GLUCOPHAGE-XR) 500 MG 24 hr tablet Take 1 tablet (500 mg total) by mouth 2 (two) times daily. 11/25/11  Yes Newt Lukes, MD  Multiple Vitamins-Minerals (CENTRUM SILVER) tablet Take 1 tablet by mouth daily.     Yes Historical Provider, MD  Omega-3 Fatty Acids (FISH OIL) 1200 MG CAPS Take by mouth 2 (two) times daily.     Yes Historical Provider, MD  ONE TOUCH LANCETS MISC by Does not apply route. Use two times a day to check blood sugars     Yes Historical Provider, MD  ONE TOUCH ULTRA TEST test strip CHECK BLOOD SUGAR TWO TIMES A DAY DX: 250.00  06/04/11  Yes Newt Lukes, MD  potassium chloride SA (KLOR-CON M20) 20 MEQ tablet Take 1 tablet (20 mEq total) by mouth 2 (two) times daily. 01/07/12 01/07/13 Yes Storm Frisk, MD  pravastatin (PRAVACHOL) 40 MG tablet Take 40 mg by mouth daily.   Yes Historical Provider, MD  predniSONE (DELTASONE) 10 MG tablet Take 10 mg by mouth daily.   Yes Historical Provider, MD  sennosides-docusate sodium (SENOKOT-S) 8.6-50 MG tablet Take 1 tablet by mouth at bedtime as needed. For constipation   Yes Historical Provider, MD  sertraline (ZOLOFT) 50 MG tablet Take 1 tablet (50 mg total) by mouth daily. 11/25/11  Yes Newt Lukes, MD  tiotropium (SPIRIVA) 18 MCG inhalation capsule Place 1 capsule (18 mcg total) into inhaler and inhale daily. 10/07/11 10/06/12 Yes Storm Frisk, MD  vitamin E 400 UNIT capsule Take 400 Units by mouth 2 (two) times daily.    Yes Historical Provider, MD    Allergies Allergies  Allergen Reactions  . Codeine     REACTION: makes pt pass out  . Levaquin (Levofloxacin In D5w) Other (See Comments)    redness  . Sulfonamide Derivatives     REACTION: edema    Family History Family History  Problem Relation Age of Onset  . Diabetes Neg Hx   . Cancer Neg Hx     Social History  reports that she quit smoking about 5 years ago. Her smoking use included Cigarettes. She has a 135 pack-year smoking history. She has never used smokeless tobacco. She reports that she does not drink alcohol or use illicit drugs.  Review Of Systems  Review of Systems  Constitutional:feels better, no further fevers or chills, still weak after acute illness HEENT: No headaches, visual changes, Difficulty swallowing, Tooth/dental problems, or Sore throat,  No sneezing, itching, ear ache, nasal congestion, post nasal drip, no visual complaints CV: No chest pain, Orthopnea, PND, swelling in lower extremities, dizziness, palpitations, syncope.  GI No heartburn, indigestion, abdominal pain, nausea,  vomiting, diarrhea, change in bowel habits, loss of appetite, bloody stools.  Resp: Cough better, sputum now clear Skin: no rash or itching or icterus, multiple areas of ecchymosis GU: no dysuria, change in color of urine, no urgency or frequency. No flank pain, no hematuria  MS: No joint pain or swelling. No decreased range of motion  Psych: No change in mood or affect. No depression or anxiety.  Neuro: no difficulty with speech, weakness, numbness, ataxia    Vital Signs: BP 132/77  Pulse 80  Temp(Src) 97.9 F (36.6 C) (Oral)  Resp 20  Ht 5\' 2"  (1.575 m)  Wt 83.1 kg (183 lb 3.2 oz)  BMI 33.51 kg/m2  SpO2 90%      Intake/Output Summary (Last 24 hours) at 01/20/12 1123 Last data  filed at 01/20/12 0756  Gross per 24 hour  Intake    972 ml  Output   1425 ml  Net   -453 ml    Physical Examination: General: 71 year old female in no acute distress.  Neuro:  Awake, oriented X 3, no focal def HEENT:  [place], no JVD Cardiovascular:rrr Lungs:  Crackles on right, no wheeze, occ rhonchi after cough Abdomen:non-tender, + bowel sounds Musculoskeletal:intacts Skin:scattered area of ecchymosis   Labs and Imaging:   Lab 01/18/12 0405 01/17/12 2152 01/17/12 1322  NA 141 -- 139  K 4.0 -- 4.8  CL 98 -- 96  CO2 25 -- 20  BUN 26* -- 19  CREATININE 0.99 0.92 0.85  GLUCOSE 199* -- 208*    Lab 01/18/12 0405 01/17/12 2152 01/17/12 1322  HGB 15.1* 15.7* 16.8*  HCT 44.0 44.1 48.9*  WBC 10.3 11.1* 11.1*  PLT 252 242 242   PCXR 17th: bilateral infiltrates.   ASSESSMENT AND PLAN  Principal Problem:  *Acute respiratory failure Active Problems:  DIABETES, TYPE 2  C O P D  Community acquired pneumonia  COPD exacerbation  NSTEMI (non-ST elevated myocardial infarction)  Conjunctivitis of left eye   Acute on Chronic Respiratory Failure in the setting of CAP, c/b NSTEMI super imposed on underlying Gold stage IV COPD. Her symptoms have not been well controlled at baseline requiring  SABA support frequently in addition to her maintenance regimen (symbicort).  She has responded well to treatment while here in the hospital but she is aware of her steady decline, as well as the impact her recent NSTEMI and acute illness will have on her.  She has asked [place] to help arrange home hospice. She also feels she gets better symptom relief w/ her bronchodilators nebulized.  Recommendation 1) cont oxygen 2) complete 10d abx 3) taper pred slowly 4)change BD regimen to albuterol/atrovent and budesonide QID, w/ PRN albuterol all nebulized. Would stop symbicort 5)home with hospice (have placed order) 6) f/u with Dr Delford Field June 12th at 4pm  Anders Simmonds ACNP-BC [facility] Pager # [phone] OR # [phone] if no answer  Reviewed above, examined pt, and agree with assessment/plan.    Have made adjustments to her nebulizer regimen.  Arrange for home hospice.  F/U with Dr. Delford Field in June.  Coralyn Helling, MD 01/20/2012, 12:34 PM Pager:  [phone]

## 2012-01-20 NOTE — Progress Notes (Signed)
[facility] PROGRESS NOTE  Kaitlin Henderson WJX:[id number] DOB: [birth year] DOA: 01/17/2012 PCP: [name], MD, MD Pulmonologist: [name], MD--notified of admission  Assessment/Plan: 1. Acute respiratory failure: Resolved and stable off BiPAP. Continue empiric treatment for pneumonia and COPD exacerbation. No reason to suspect MRSA or pseudomonas.   2. Community acquired pneumonia: Continues to improve. Continue levofloxacin. 3. COPD exacerbation/oxygen-dependent COPD: Appears resolved. Decrease steroids; continue nebulizer treatments, supplemental oxygen. 4. Non-ST elevation MI: Asymptomatic. Most likely reflects strain rather than acute coronary syndrome/primary event.  Completed 48 hours of therapeutic Lovenox. Continue aspirin. No beta blocker secondary to COPD. 2-D echocardiogram reassuring. Conservative management. Not an interventional candidate. 5. Elevated BNP: 2-D echocardiogram revealed diastolic dysfunction. Appears to be euvolemic. No clinical evidence of decompensated heart failure or volume overload. Continue chronic oral Lasix. 6. Conjunctivitis left eye: Continues to improve. Continue empiric ophthalmic drops. 7. Diabetes mellitus type 2: Stable. Continue sliding scale insulin and Amaryl. Hold metformin while hospitalized.  Continues to improve. Physical therapy consultation; anticipate discharge May 21.  Code Status: DO NOT RESUSCITATE/DO NOT INTUBATE  Family Communication: None bedside  Disposition Plan: Home when improved.  Kaitlin Sacks, MD  [facility] Pager [phone]. If 8PM-8AM, please contact night-coverage at [email or web address], password [facility] 01/20/2012, 9:31 AM  LOS: 3 days   Brief narrative: 71 year old woman with history of oxygen-dependent COPD presented with acute respiratory failure, pneumonia, COPD exacerbation, non-ST elevation MI, type 2.  Consultants:  None  Procedures:  01/18/2012 2-D echocardiogram: Left  ventricular ejection fraction 60-65%. Normal wall motion. Grade 1 diastolic dysfunction.  Antibiotics:  Rocephin May 17?18  Levaquin May 17?24  HPI/Subjective: Afebrile, vital signs stable. Oxygen requirement at baseline. Tachypnea resolved. Blood cultures no growth to date. Sputum culture pending. Feels well. Slept well.  Objective: Filed Vitals:   01/19/12 2358 01/20/12 0301 01/20/12 0632 01/20/12 0835  BP: 114/55 102/42 132/77   Pulse: 88 82 80   Temp: 97.7 F (36.5 C) 97.8 F (36.6 C) 97.9 F (36.6 C)   TempSrc: Oral Oral Oral   Resp: 20 20 20    Height:      Weight:   83.1 kg (183 lb 3.2 oz)   SpO2: 93% 93% 94% 90%    Intake/Output Summary (Last 24 hours) at 01/20/12 0931 Last data filed at 01/20/12 0756  Gross per 24 hour  Intake    972 ml  Output   1425 ml  Net   -453 ml    Exam:   General:  Appears calm and comfortable.   Eyes: Left eye without matting, sclerus with minimal injection.  Cardiovascular: Regular rate and rhythm. No murmur, rub, gallop. No lower extremity edema.  Respiratory: Clear to auscultation bilaterally. Improved air movement. No frank wheezes, rales, rhonchi. Normal respiratory effort. Speaks in full sentences.  Psychiatric: Grossly normal mood and affect. Speech fluent and appropriate.  Data Reviewed: Basic Metabolic Panel:  Lab 01/18/12 7846 01/17/12 2152 01/17/12 1322  NA 141 -- 139  K 4.0 -- 4.8  CL 98 -- 96  CO2 25 -- 20  GLUCOSE 199* -- 208*  BUN 26* -- 19  CREATININE 0.99 0.92 0.85  CALCIUM 9.9 -- 10.0  MG -- -- --  PHOS -- -- --   CBC:  Lab 01/18/12 0405 01/17/12 2152 01/17/12 1322  WBC 10.3 11.1* 11.1*  NEUTROABS -- -- 8.5*  HGB 15.1* 15.7* 16.8*  HCT 44.0 44.1 48.9*  MCV 97.6 96.3 97.4  PLT 252 242 242   Cardiac Enzymes:  Lab 01/19/12 0452 01/18/12 0827 01/17/12 2345 01/17/12 1711  CKTOTAL 198* 210* 229* 151  CKMB 12.6* 14.9* 13.1* 7.0*  CKMBINDEX -- -- -- --  TROPONINI 1.86* 2.48* 2.05* 1.40*   BNP  (last 3 results)  Basename 01/17/12 1418  PROBNP 1047.0*   CBG:  Lab 01/20/12 0616 01/19/12 2209 01/19/12 1547 01/19/12 1228 01/19/12 0821  GLUCAP 171* 239* 197* 227* 207*    Recent Results (from the past 240 hour(s))  CULTURE, BLOOD (ROUTINE X 2)     Status: Normal (Preliminary result)   Collection Time   01/17/12  5:00 PM      Component Value Range Status Comment   Specimen Description BLOOD ARM LEFT   Final    Special Requests BOTTLES DRAWN AEROBIC AND ANAEROBIC 10CC   Final    Culture  Setup Time [id number]   Final    Culture     Final    Value:        BLOOD CULTURE RECEIVED NO GROWTH TO DATE CULTURE WILL BE HELD FOR 5 DAYS BEFORE ISSUING A FINAL NEGATIVE REPORT   Report Status PENDING   Incomplete   CULTURE, BLOOD (ROUTINE X 2)     Status: Normal (Preliminary result)   Collection Time   01/17/12  5:18 PM      Component Value Range Status Comment   Specimen Description BLOOD ARM RIGHT   Final    Special Requests BOTTLES DRAWN AEROBIC ONLY 10CC   Final    Culture  Setup Time [id number]   Final    Culture     Final    Value:        BLOOD CULTURE RECEIVED NO GROWTH TO DATE CULTURE WILL BE HELD FOR 5 DAYS BEFORE ISSUING A FINAL NEGATIVE REPORT   Report Status PENDING   Incomplete   MRSA PCR SCREENING     Status: Normal   Collection Time   01/18/12  3:43 AM      Component Value Range Status Comment   MRSA by PCR NEGATIVE  NEGATIVE  Final   CULTURE, EXPECTORATED SPUTUM-ASSESSMENT     Status: Normal   Collection Time   01/18/12 11:00 AM      Component Value Range Status Comment   Specimen Description SPUTUM   Final    Special Requests NONE   Final    Sputum evaluation     Final    Value: THIS SPECIMEN IS ACCEPTABLE. RESPIRATORY CULTURE REPORT TO FOLLOW.   Report Status 01/18/2012 FINAL   Final   CULTURE, RESPIRATORY     Status: Normal (Preliminary result)   Collection Time   01/18/12 11:00 AM      Component Value Range Status Comment   Specimen Description SPUTUM   Final     Special Requests NONE   Final    Gram Stain     Final    Value: MODERATE WBC PRESENT, PREDOMINANTLY PMN     RARE SQUAMOUS EPITHELIAL CELLS PRESENT     RARE GRAM POSITIVE COCCI IN PAIRS     RARE GRAM POSITIVE RODS   Culture Culture reincubated for better growth   Final    Report Status PENDING   Incomplete     Scheduled Meds:    . ipratropium  0.5 mg Nebulization Q6H   And  . albuterol  2.5 mg Nebulization Q6H  . antiseptic oral rinse  15 mL Mouth Rinse q12n4p  . aspirin EC  81 mg Oral Daily  . bacitracin-polymyxin b  1 application Left Eye Q4H  . chlorhexidine  15 mL Mouth/Throat BID  . enoxaparin (LOVENOX) injection  40 mg Subcutaneous Q24H  . enoxaparin (LOVENOX) injection  85 mg Subcutaneous Q12H  . furosemide  40 mg Oral Daily  . glimepiride  1 mg Oral BID WC  . insulin aspart  0-15 Units Subcutaneous TID WC  . levofloxacin (LEVAQUIN) IV  750 mg Intravenous Q24H  . methylPREDNISolone (SOLU-MEDROL) injection  60 mg Intravenous Q12H  . pantoprazole  40 mg Oral Daily  . potassium chloride SA  20 mEq Oral BID  . sertraline  50 mg Oral Daily  . simvastatin  20 mg Oral q1800  . sodium chloride  3 mL Intravenous Q12H   Continuous Infusions:   Principal Problem:  *Acute respiratory failure Active Problems:  C O P D  Community acquired pneumonia  COPD exacerbation  NSTEMI (non-ST elevated myocardial infarction)  DIABETES, TYPE 2  Conjunctivitis of left eye

## 2012-01-20 NOTE — Evaluation (Addendum)
Physical Therapy Evaluation Patient Details Name: Kaitlin Henderson MRN: [id number] DOB: 12-Feb-1941 Today's Date: 01/20/2012 Time: 4098-1191 PT Time Calculation (min): 47 min  PT Assessment / Plan / Recommendation Clinical Impression  Kaitlin Henderson was admitted 5/17 with ARF. Presents to PT today with generalized weakness, significantly decreased activity tolerance affecting her overall mobility and independence. Will benefit physical therapy in the acute setting to address these and the below problem list so as to maximize her independence for a safe d/c home. I am honestly concerned and would recommend 24 hour assist on d/c given pt's low activity tolerance. I have made these concerns known to the patient but she reports noone can stay with her but that she has great friends and neighbors that check on her often. She is very determined to go home and be independent but will need education concerning energy conservation because at this point she is still talking about driving and going to the grocery store when she could barely get on and off the commode and walk 20 ft without becoming significantly fatigued. I would definitely recommend an OT consult prior to her d/c. She will need to use her RW at home. She is requesting a new O2 tank. She also would benefit from a toilet riser with handles as well as a shower stool.   Kaitlin Henderson became very emotional with me, expressing her concern that when she gets home she won't be able to breathe. RN made aware.   PT Assessment  Patient needs continued PT services    Follow Up Recommendations  24 hour Supervision for mobility/OOB;Home health PT   Barriers to Discharge Inaccessible home environment;Decreased caregiver support      lEquipment Recommendations  Toilet rise with handles;Tub/shower seat    Recommendations for Other Services OT consult   Frequency Min 3X/week    Precautions / Restrictions Precautions Precautions: Fall   Pertinent  Vitals/Pain Pt began our session on 3 liters but O2 continued to drop as she began to work more. Eventually needed 4 L to maintain 88% or above during activity. RN made aware that this was left at 4 L because she was 88-89% when we finished up.       Mobility  Bed Mobility Bed Mobility: Supine to Sit;Sit to Supine;Scooting to HOB Supine to Sit: 4: Min assist;HOB elevated;With rails Sit to Supine: 4: Min assist Scooting to [facility]: With rail;4: Min guard Details for Bed Mobility Assistance: pt pulling on therapist to come upright into sitting; needed a few minutes to recover her breathing; sit->supine pt needed minA to bring legs to bed level and to v/c's to sequence scooting HOB with bridging and pulling on the rails of her bed Transfers Transfers: Sit to Stand;Stand to Sit Sit to Stand: 5: Supervision;With upper extremity assist;From bed;From toilet (using grab bars by toilet) Stand to Sit: To bed;To toilet;With upper extremity assist (using grab bars by the toilet) Details for Transfer Assistance: Cues for safe hand placement and safe technique with RW; multiple sit<->stands for pericare, pt fatigued very quickly with this requring increase in O2 to 4 liters during mobility to maintain >/=88% Ambulation/Gait Ambulation/Gait Assistance: 4: Min guard;5: Supervision Ambulation Distance (Feet): 20 Feet (10 ft x2) Assistive device: Rolling walker Ambulation/Gait Assistance Details: ambulated 10 ft to the toilet and 10 ft back to her bed; did well walking to the toilet but after using the restroom and multiple sit<->stands for pericare because she was incontinent with bowel and was unaware until she started cleaning  up pt became very fatigued and SOB requiring seated rest break and assistance to finish pericare; with fatigue pt needing mingaurdA for safety with RW and breathing technique Gait Pattern: Trunk flexed;Decreased stride length    Exercises     PT Diagnosis: Difficulty walking;Generalized  weakness  PT Problem List: Decreased activity tolerance;Decreased mobility;Cardiopulmonary status limiting activity;Decreased knowledge of use of DME PT Treatment Interventions: DME instruction;Gait training;Stair training;Functional mobility training;Therapeutic activities;Therapeutic exercise;Balance training;Neuromuscular re-education;Patient/family education   PT Goals Acute Rehab PT Goals PT Goal Formulation: With patient Time For Goal Achievement: 01/27/12 Potential to Achieve Goals: Good Pt will go Supine/Side to Sit: with modified independence;with HOB 0 degrees PT Goal: Supine/Side to Sit - Progress: Goal set today Pt will go Sit to Supine/Side: with modified independence;with HOB 0 degrees PT Goal: Sit to Supine/Side - Progress: Goal set today Pt will go Sit to Stand: with modified independence PT Goal: Sit to Stand - Progress: Goal set today Pt will go Stand to Sit: with modified independence PT Goal: Stand to Sit - Progress: Goal set today Pt will Transfer Bed to Chair/Chair to Bed: with modified independence PT Transfer Goal: Bed to Chair/Chair to Bed - Progress: Goal set today Pt will Ambulate: 16 - 50 feet;with modified independence;with least restrictive assistive device (with O2 sats >/=88% on 3 L) PT Goal: Ambulate - Progress: Goal set today Pt will Go Up / Down Stairs: 3-5 stairs;with supervision;with least restrictive assistive device;with rail(s) PT Goal: Up/Down Stairs - Progress: Goal set today Additional Goals Additional Goal #1: Pt will independently recongize her need for a rest break.  PT Goal: Additional Goal #1 - Progress: Goal set today  Visit Information  Last PT Received On: 01/20/12 Assistance Needed: +1    Subjective Data  Subjective: they almost lost me on Friday. Im gonna go home and hospice will help me.    Prior Functioning  Home Living Lives With: Alone Available Help at Discharge: Friend(s);Neighbor;Available PRN/intermittently (planning on  going home with hospice) Type of Home: Mobile home Home Access: Stairs to enter Entrance Stairs-Number of Steps: 4-5 steps Entrance Stairs-Rails: Can reach both Home Layout: One level Bathroom Shower/Tub: Health visitor: Handicapped height (in her room its just a normal toilet) Home Adaptive Equipment: Long-handled sponge;Grab bars in shower;Walker - rolling;Straight cane Prior Function Level of Independence: Independent Able to Take Stairs?: Yes Driving: Yes Vocation: Retired Comments: was doing all her grocery store shopping, driving, bathing, dressing all independently Communication Communication: No difficulties    Cognition  Overall Cognitive Status: Appears within functional limits for tasks assessed/performed Arousal/Alertness: Awake/alert Orientation Level: Appears intact for tasks assessed Behavior During Session: Anxious    Extremity/Trunk Assessment Right Upper Extremity Assessment RUE ROM/Strength/Tone: WFL for tasks assessed RUE Sensation: WFL - Light Touch;WFL - Proprioception RUE Coordination: Deficits RUE Coordination Deficits: some shakiness as she fatigued more Left Upper Extremity Assessment LUE ROM/Strength/Tone: WFL for tasks assessed LUE Sensation: WFL - Light Touch;WFL - Proprioception LUE Coordination: Deficits LUE Coordination Deficits: some shakiness as she fatigued more Right Lower Extremity Assessment RLE ROM/Strength/Tone: WFL for tasks assessed RLE ROM/Strength/Tone Deficits: generally weak with decreased activity tolerance RLE Sensation: WFL - Light Touch;WFL - Proprioception RLE Coordination: WFL - gross/fine motor Left Lower Extremity Assessment LLE ROM/Strength/Tone: WFL for tasks assessed LLE ROM/Strength/Tone Deficits: generally weak with decreased activity tolerance LLE Sensation: WFL - Light Touch;WFL - Proprioception LLE Coordination: WFL - gross/fine motor Trunk Assessment Trunk Assessment: Kyphotic   Balance      End  of Session PT - End of Session Equipment Utilized During Treatment: Gait belt Activity Tolerance: Treatment limited secondary to medical complications (Comment);Patient limited by fatigue Patient left: in bed;with call bell/phone within reach Nurse Communication: Mobility status;Other (comment) (OT rec, pt's fearfulness for breathing at home, 4 L O2)   WHITLOW,Darlynn Ricco [name] 01/20/2012, 3:26 PM

## 2012-01-20 NOTE — Progress Notes (Signed)
INITIAL ADULT NUTRITION ASSESSMENT Date: 01/20/2012   Time: 9:46 AM Reason for Assessment: Nutrition Risk, unintentional weight loss  ASSESSMENT: [name]  Dx: Acute respiratory failure  Hx:  Past Medical History  Diagnosis Date  . Breast cancer, right breast 1998    s/p chemo & XRT, right mastectomy  . OBESITY   . CORONARY ARTERY DISEASE   . APHTHOUS STOMATITIS   . DEPRESSION     started sertraline 09/2010  . DIABETES, TYPE 2 dx 03/2010  . HYPERLIPIDEMIA   . Hypothyroidism   . C O P D     chronic O2 3LPM [place]  . Essential tremor     Related Meds:     . ipratropium  0.5 mg Nebulization Q6H   And  . albuterol  2.5 mg Nebulization Q6H  . antiseptic oral rinse  15 mL Mouth Rinse q12n4p  . aspirin EC  81 mg Oral Daily  . bacitracin-polymyxin b  1 application Left Eye Q4H  . chlorhexidine  15 mL Mouth/Throat BID  . enoxaparin (LOVENOX) injection  40 mg Subcutaneous Q24H  . enoxaparin (LOVENOX) injection  85 mg Subcutaneous Q12H  . furosemide  40 mg Oral Daily  . glimepiride  1 mg Oral BID WC  . insulin aspart  0-15 Units Subcutaneous TID WC  . levofloxacin (LEVAQUIN) IV  750 mg Intravenous Q24H  . methylPREDNISolone (SOLU-MEDROL) injection  60 mg Intravenous Q12H  . pantoprazole  40 mg Oral Daily  . potassium chloride SA  20 mEq Oral BID  . sertraline  50 mg Oral Daily  . simvastatin  20 mg Oral q1800  . sodium chloride  3 mL Intravenous Q12H     Ht: 5\' 2"  (157.5 cm)  Wt: 183 lb 3.2 oz (83.1 kg) (Scale B.)  Ideal Wt: 50 kg % Ideal Wt: 166%  Usual Wt:  Wt Readings from Last 5 Encounters:  01/20/12 183 lb 3.2 oz (83.1 kg)  11/25/11 190 lb 12.8 oz (86.546 kg)  11/25/11 192 lb 6.4 oz (87.272 kg)  11/25/11 191 lb (86.637 kg)  09/05/11 203 lb (92.08 kg)    % Usual Wt: 96%  Body mass index is 33.51 kg/(m^2). Pt is obese   Food/Nutrition Related Hx: Pt reports decreased intake and weight loss  Labs:  CMP     Component Value Date/Time   NA 141  01/18/2012 0405   K 4.0 01/18/2012 0405   CL 98 01/18/2012 0405   CO2 25 01/18/2012 0405   GLUCOSE 199* 01/18/2012 0405   BUN 26* 01/18/2012 0405   CREATININE 0.99 01/18/2012 0405   CALCIUM 9.9 01/18/2012 0405   PROT 6.7 09/25/2010 1340   ALBUMIN 4.0 09/25/2010 1340   AST 22 09/25/2010 1340   ALT 23 09/25/2010 1340   ALKPHOS 48 09/25/2010 1340   BILITOT 0.6 09/25/2010 1340   GFRNONAA 56* 01/18/2012 0405   GFRAA 65* 01/18/2012 0405   CBG (last 3)   Basename 01/20/12 0616 01/19/12 2209 01/19/12 1547  GLUCAP 171* 239* 197*    Lab Results  Component Value Date   HGBA1C 6.5* 01/19/2012     Intake/Output Summary (Last 24 hours) at 01/20/12 0948 Last data filed at 01/20/12 0756  Gross per 24 hour  Intake    972 ml  Output   1425 ml  Net   -453 ml     Diet Order: Cardiac  Supplements/Tube Feeding: none  IVF:    Estimated Nutritional Needs:   Kcal: 1675-1800  Protein:  60-70 gm  Fluid:  1.7 - 1.8 L  Pt reports weight loss, weight hx indicates about 8 lb loss in 2 months (4.2% not significant). RD spoke with pt who stated that she probably was eating less, was unable to provide detailed food recall. Weight loss is desirable in this pt given obesity and COPD.  Pt with 100& meal completion for breakfast.   NUTRITION DIAGNOSIS: -Altered nutrition-related laboratory values (specify) (NI-2.2).  Status: Ongoing  RELATED TO: likely medication related hyperglycemia  AS EVIDENCE BY: CBG's 171-239 with HbgA1c 6.5%  MONITORING/EVALUATION(Goals): Goal: PO intake will be adequate to meet nutrition needs  Monitor: PO intake, weight, lab's, I/O's   EDUCATION NEEDS: -No education needs identified at this time  INTERVENTION: 1. RD will continue to monitor and provide interventions as needed  Dietitian [phone]  DOCUMENTATION CODES Per approved criteria  -Obesity Unspecified    Kaitlin Henderson 01/20/2012, 9:46 AM

## 2012-01-21 ENCOUNTER — Telehealth: Payer: Self-pay | Admitting: *Deleted

## 2012-01-21 ENCOUNTER — Telehealth: Payer: Self-pay | Admitting: Critical Care Medicine

## 2012-01-21 DIAGNOSIS — I214 Non-ST elevation (NSTEMI) myocardial infarction: Secondary | ICD-10-CM

## 2012-01-21 DIAGNOSIS — J438 Other emphysema: Secondary | ICD-10-CM

## 2012-01-21 DIAGNOSIS — J9600 Acute respiratory failure, unspecified whether with hypoxia or hypercapnia: Secondary | ICD-10-CM

## 2012-01-21 DIAGNOSIS — D696 Thrombocytopenia, unspecified: Secondary | ICD-10-CM

## 2012-01-21 LAB — CBC
HCT: 44.2 % (ref 36.0–46.0)
MCHC: 33 g/dL (ref 30.0–36.0)
MCV: 99.3 fL (ref 78.0–100.0)
RDW: 12.6 % (ref 11.5–15.5)

## 2012-01-21 LAB — GLUCOSE, CAPILLARY
Glucose-Capillary: 134 mg/dL — ABNORMAL HIGH (ref 70–99)
Glucose-Capillary: 202 mg/dL — ABNORMAL HIGH (ref 70–99)
Glucose-Capillary: 282 mg/dL — ABNORMAL HIGH (ref 70–99)
Glucose-Capillary: 154 mg/dL — ABNORMAL HIGH (ref 70–99)

## 2012-01-21 NOTE — Progress Notes (Signed)
[facility] PROGRESS NOTE  Kaitlin Henderson ZOX:[id number] DOB: 1941-01-20 DOA: 01/17/2012 PCP: [name], MD, MD Pulmonologist: [name], MD  Assessment/Plan: 1. Acute respiratory failure: Improved rapidly during hospitalization, but now has relapsed. For now, continue empiric treatment for pneumonia and COPD exacerbation. No reason to suspect MRSA or pseudomonas.   2. Community acquired pneumonia: Appears worse today. Continue levofloxacin. 3. COPD exacerbation/oxygen-dependent COPD: Appears resolved. Slow steroid taper; continue nebulizer treatments, supplemental oxygen. 4. Non-ST elevation MI: Asymptomatic. Most likely reflects strain rather than acute coronary syndrome/primary event.  Completed 48 hours of therapeutic Lovenox. Continue aspirin. No beta blocker secondary to COPD. 2-D echocardiogram reassuring. Conservative management. Not an interventional candidate. 5. Elevated BNP: 2-D echocardiogram revealed diastolic dysfunction. Appears to be euvolemic. No clinical evidence of decompensated heart failure or volume overload. Continue chronic oral Lasix. 6. Conjunctivitis left eye: Stable. Continue empiric ophthalmic drops. 7. Diabetes mellitus type 2: Stable. Continue sliding scale insulin and Amaryl. Hold metformin while hospitalized.  Appears worse today, more hypoxic and short of breath. Realistic outlook and states she is ready to die. Patient lives alone--I do not think she would manage well at home by herself at this point. Even with hospice it may be difficult to control her symptoms. Give her acute change will continue to monitor without becoming any more aggressive with care. If respiratory status stabilizes then will plan home with hospice next 24-48 hours. If not, then comfort care would be appropriate and acceptable to patient.  Code Status: DO NOT RESUSCITATE/DO NOT INTUBATE  Family Communication: None bedside  Disposition Plan: Home when improved.  Kaitlin Sacks, MD  [facility] Pager [phone]. If 8PM-8AM, please contact night-coverage at [email or web address], password [facility] 01/21/2012, 11:36 AM  LOS: 4 days   Brief narrative: 71 year old woman with history of oxygen-dependent COPD presented with acute respiratory failure, pneumonia, COPD exacerbation, non-ST elevation MI, type 2.  Consultants:  Pulmonology: Recommendations: Continue oxygen, complete 10 days of antibiotics, taper prednisone slowly, change bronchodilator regimen to albuterol/Atrovent and budesonide 4 times a day with albuterol as needed, all nebulized. Stop Symbicort. Home with hospice. Followup with Dr. Delford Field June 12 at 4 PM.  Physical therapy: Home health physical therapy. Tub/shower seat. Toilet riser with handles.  Occupational therapy: Home health occupational therapy.  Procedures:  01/18/2012 2-D echocardiogram: Left ventricular ejection fraction 60-65%. Normal wall motion. Grade 1 diastolic dysfunction.  Antibiotics:  Rocephin May 17?18  Levaquin May 17?26  HPI/Subjective: Borderline hypoxic on 4 L. Vital signs otherwise stable.  Objective: Filed Vitals:   01/21/12 0819 01/21/12 0935 01/21/12 1000 01/21/12 1136  BP:   101/64   Pulse:   79   Temp:   97.6 F (36.4 C)   TempSrc:   Oral   Resp:   20   Height:      Weight:      SpO2: 91% 85% 88% 88%    Intake/Output Summary (Last 24 hours) at 01/21/12 1136 Last data filed at 01/21/12 1032  Gross per 24 hour  Intake   1326 ml  Output    750 ml  Net    576 ml    Exam:   General:  Appears calm and comfortable.   Eyes: Left eye without matting, sclerus with minimal injection.  Cardiovascular: Regular rate and rhythm. No murmur, rub, gallop. No lower extremity edema.  Respiratory: Clear to auscultation bilaterally. Improved air movement. No frank wheezes, rales, rhonchi. Normal respiratory effort. Speaks in full sentences.  Psychiatric: Grossly normal mood  and affect. Speech fluent  and appropriate.  Data Reviewed: Basic Metabolic Panel:  Lab 01/18/12 1610 01/17/12 2152 01/17/12 1322  NA 141 -- 139  K 4.0 -- 4.8  CL 98 -- 96  CO2 25 -- 20  GLUCOSE 199* -- 208*  BUN 26* -- 19  CREATININE 0.99 0.92 0.85  CALCIUM 9.9 -- 10.0  MG -- -- --  PHOS -- -- --   CBC:  Lab 01/21/12 0545 01/18/12 0405 01/17/12 2152 01/17/12 1322  WBC 11.9* 10.3 11.1* 11.1*  NEUTROABS -- -- -- 8.5*  HGB 14.6 15.1* 15.7* 16.8*  HCT 44.2 44.0 44.1 48.9*  MCV 99.3 97.6 96.3 97.4  PLT 274 252 242 242   Cardiac Enzymes:  Lab 01/19/12 0452 01/18/12 0827 01/17/12 2345 01/17/12 1711  CKTOTAL 198* 210* 229* 151  CKMB 12.6* 14.9* 13.1* 7.0*  CKMBINDEX -- -- -- --  TROPONINI 1.86* 2.48* 2.05* 1.40*   BNP (last 3 results)  Basename 01/17/12 1418  PROBNP 1047.0*   CBG:  Lab 01/21/12 0628 01/20/12 2145 01/20/12 1611 01/20/12 1120 01/20/12 0616  GLUCAP 134* 135* 216* 289* 171*    Recent Results (from the past 240 hour(s))  CULTURE, BLOOD (ROUTINE X 2)     Status: Normal (Preliminary result)   Collection Time   01/17/12  5:00 PM      Component Value Range Status Comment   Specimen Description BLOOD ARM LEFT   Final    Special Requests BOTTLES DRAWN AEROBIC AND ANAEROBIC 10CC   Final    Culture  Setup Time [id number]   Final    Culture     Final    Value:        BLOOD CULTURE RECEIVED NO GROWTH TO DATE CULTURE WILL BE HELD FOR 5 DAYS BEFORE ISSUING A FINAL NEGATIVE REPORT   Report Status PENDING   Incomplete   CULTURE, BLOOD (ROUTINE X 2)     Status: Normal (Preliminary result)   Collection Time   01/17/12  5:18 PM      Component Value Range Status Comment   Specimen Description BLOOD ARM RIGHT   Final    Special Requests BOTTLES DRAWN AEROBIC ONLY 10CC   Final    Culture  Setup Time [id number]   Final    Culture     Final    Value:        BLOOD CULTURE RECEIVED NO GROWTH TO DATE CULTURE WILL BE HELD FOR 5 DAYS BEFORE ISSUING A FINAL NEGATIVE REPORT   Report Status PENDING    Incomplete   MRSA PCR SCREENING     Status: Normal   Collection Time   01/18/12  3:43 AM      Component Value Range Status Comment   MRSA by PCR NEGATIVE  NEGATIVE  Final   CULTURE, EXPECTORATED SPUTUM-ASSESSMENT     Status: Normal   Collection Time   01/18/12 11:00 AM      Component Value Range Status Comment   Specimen Description SPUTUM   Final    Special Requests NONE   Final    Sputum evaluation     Final    Value: THIS SPECIMEN IS ACCEPTABLE. RESPIRATORY CULTURE REPORT TO FOLLOW.   Report Status 01/18/2012 FINAL   Final   CULTURE, RESPIRATORY     Status: Normal   Collection Time   01/18/12 11:00 AM      Component Value Range Status Comment   Specimen Description SPUTUM   Final    Special Requests  NONE   Final    Gram Stain     Final    Value: MODERATE WBC PRESENT, PREDOMINANTLY PMN     RARE SQUAMOUS EPITHELIAL CELLS PRESENT     RARE GRAM POSITIVE COCCI IN PAIRS     RARE GRAM POSITIVE RODS   Culture NORMAL OROPHARYNGEAL FLORA   Final    Report Status 01/20/2012 FINAL   Final     Scheduled Meds:    . ipratropium  0.5 mg Nebulization Q6H   And  . albuterol  2.5 mg Nebulization Q6H  . antiseptic oral rinse  15 mL Mouth Rinse q12n4p  . aspirin EC  81 mg Oral Daily  . bacitracin-polymyxin b  1 application Left Eye Q4H  . budesonide  0.25 mg Nebulization Q6H  . chlorhexidine  15 mL Mouth/Throat BID  . enoxaparin (LOVENOX) injection  40 mg Subcutaneous Q24H  . furosemide  40 mg Oral Daily  . glimepiride  1 mg Oral BID WC  . insulin aspart  0-15 Units Subcutaneous TID WC  . levofloxacin  750 mg Oral Q24H  . pantoprazole  40 mg Oral Daily  . potassium chloride SA  20 mEq Oral BID  . predniSONE  40 mg Oral Q breakfast  . sertraline  50 mg Oral Daily  . sodium chloride  3 mL Intravenous Q12H  . DISCONTD: levofloxacin (LEVAQUIN) IV  750 mg Intravenous Q24H   Continuous Infusions:   Principal Problem:  *Acute respiratory failure Active Problems:  C O P D  Community  acquired pneumonia  COPD exacerbation  NSTEMI (non-ST elevated myocardial infarction)  DIABETES, TYPE 2  Conjunctivitis of left eye

## 2012-01-21 NOTE — Progress Notes (Signed)
Patient's O2 sats are 88% on 4.5L sustaining, MD made aware and is not concerned at this time orders given to increase as needed at the discretion of the RN; will continue to monitor patient___________________D. Manson Passey RN

## 2012-01-21 NOTE — Progress Notes (Signed)
Physical Therapy Treatment Patient Details Name: Kaitlin Henderson MRN: [id number] DOB: [birth year] Today's Date: 01/21/2012 Time: 7829-5621 PT Time Calculation (min): 28 min  PT Assessment / Plan / Recommendation Comments on Treatment Session  Kaitlin Henderson was more resistant to working with therapy today reporting that she is just too tired and she doesn't feel well. She also states that she doesn't feel well enough to go home today. I asked her what she would do on days like this when she finally did get home and she reported that she will just sit around and do nothing. I still question if its the best idea for her to be living alone but she appears resistant to any type of facility or placement. Her sats dropped to low 80s with all of the activity from today and she had a difficult time catching her breath. Re-educated pt on knowing her limits and knowing when she needs to sit and rest.     Follow Up Recommendations  Home health PT;Supervision for mobility/OOB    Barriers to Discharge        Equipment Recommendations  None recommended by PT    Recommendations for Other Services    Frequency Min 3X/week   Plan Discharge plan needs to be updated;Frequency remains appropriate    Precautions / Restrictions Precautions Precautions: Fall Precaution Comments: O2 dependent Restrictions Weight Bearing Restrictions: No   Pertinent Vitals/Pain Sats dropped to 85% following session on 4 liters. RN made aware.    Mobility  Bed Mobility Bed Mobility: Supine to Sit;Sit to Supine;Scooting to [facility]  Supine to Sit: HOB elevated;With rails;5: Supervision Scooting to [facility]: 5: Supervision Details for Bed Mobility Assistance: Min verbal cues for use of bed rail to complete transfer Transfers Sit to Stand: 5: Supervision;With upper extremity assist;From bed;From toilet Stand to Sit: 5: Supervision;To chair/3-in-1;With upper extremity assist Details for Transfer Assistance: Min verbal cues for safe  hand placement and to maintain upright position Ambulation/Gait Ambulation/Gait Assistance: 4: Min guard Ambulation Distance (Feet): 5 Feet Assistive device: None Ambulation/Gait Assistance Details: pt ambulated a few feet and pivoted around to sit in the chair, cues for safe hand placement and controlled descent to chair; cues during ambulation for pursed lip breathing; very gaurded stiff pattern Gait Pattern: Trunk flexed    Exercises      PT Goals Acute Rehab PT Goals Pt will go Supine/Side to Sit: with modified independence PT Goal: Supine/Side to Sit - Progress: Progressing toward goal Pt will go Sit to Stand: with modified independence PT Goal: Sit to Stand - Progress: Progressing toward goal Pt will go Stand to Sit: with modified independence PT Goal: Stand to Sit - Progress: Progressing toward goal Pt will Transfer Bed to Chair/Chair to Bed: with modified independence PT Transfer Goal: Bed to Chair/Chair to Bed - Progress: Progressing toward goal Pt will Ambulate: 16 - 50 feet;with modified independence;with least restrictive assistive device PT Goal: Ambulate - Progress: Progressing toward goal  Visit Information  Last PT Received On: 01/21/12 Assistance Needed: +1    Subjective Data  Subjective: I just don't feel good today. I can't go home today.    Cognition  Overall Cognitive Status: Appears within functional limits for tasks assessed/performed Arousal/Alertness: Awake/alert Orientation Level: Appears intact for tasks assessed Behavior During Session: Anxious Cognition - Other Comments: Pt. anxious about going home and not moving/feeling well there    Balance     End of Session PT - End of Session Equipment Utilized During Treatment: Gait belt  Activity Tolerance: Patient limited by fatigue Patient left: in chair;with call bell/phone within reach Nurse Communication: Mobility status;Other (comment) (O2 sats 85% on 4.5 liters)    Kaitlin Henderson,Kaitlin Henderson  Kaitlin Henderson 01/21/2012, 11:01 AM

## 2012-01-21 NOTE — Telephone Encounter (Signed)
LMOM TCB x1 for Kaitlin Henderson.  Due to the nature of the message, did not feel comfortable leaving am message - wanted to give this verbally.

## 2012-01-21 NOTE — Telephone Encounter (Signed)
Yes to both questions.

## 2012-01-21 NOTE — Evaluation (Signed)
Occupational Therapy Evaluation Patient Details Name: Kaitlin Henderson MRN: [id number] DOB: 08-21-1941 Today's Date: 01/21/2012 Time: 4696-2952 OT Time Calculation (min): 29 min  OT Assessment / Plan / Recommendation Clinical Impression  Pt. presents with COPD exacerbation and overall deconditioning. Pt. will benefit from skilled OT to increase activity tolerance and increase safety at D/C home by getting pt. to supervision level with ADLs.    OT Assessment  Patient needs continued OT Services    Follow Up Recommendations  Home health OT;Supervision - Intermittent    Barriers to Discharge Decreased caregiver support;Other (comment) (Pt. lives alone and with only intermittent assist available)    Equipment Recommendations  Toilet rise with handles;Tub/shower seat       Frequency  Min 2X/week    Precautions / Restrictions Precautions Precautions: Fall Restrictions Weight Bearing Restrictions: No       ADL  Eating/Feeding: Simulated;Set up Where Assessed - Eating/Feeding: Chair Grooming: Performed;Set up;Wash/dry face Where Assessed - Grooming: Supported sitting Upper Body Bathing: Simulated;Set up Where Assessed - Upper Body Bathing: Supported sitting Lower Body Bathing: Simulated;Moderate assistance Where Assessed - Lower Body Bathing: Unsupported sit to stand Upper Body Dressing: Performed;Minimal assistance Where Assessed - Upper Body Dressing: Supported sitting Lower Body Dressing: Performed;Maximal assistance (donning bil socks) Where Assessed - Lower Body Dressing: Unsupported sit to stand Toilet Transfer: Performed;Min guard Toilet Transfer Method: Surveyor, minerals: Bedside commode Toileting - Clothing Manipulation and Hygiene: Performed;Min guard;Set up Where Assessed - Toileting Clothing Manipulation and Hygiene: Sit to stand from 3-in-1 or toilet Tub/Shower Transfer Method: Not assessed Equipment Used: Rolling walker Transfers/Ambulation  Related to ADLs: Pt. min guard ~8' with RW and min verbal cues for hand placement and safety ADL Comments: Pt. educated on energy conservation and breathing techniques to complete with mobility and ADLs to increase activity tolerance and decrease SOB. Pt. sats were 82-83% on 4LO2 throughout session and mobility and RN notified.    OT Diagnosis: Generalized weakness  OT Problem List: Decreased strength;Decreased activity tolerance;Impaired balance (sitting and/or standing);Decreased safety awareness;Decreased knowledge of use of DME or AE;Decreased knowledge of precautions;Cardiopulmonary status limiting activity OT Treatment Interventions: Self-care/ADL training;DME and/or AE instruction;Therapeutic activities;Visual/perceptual remediation/compensation;Patient/family education;Balance training;Other (comment) (Energy conservation strategies)   OT Goals Acute Rehab OT Goals OT Goal Formulation: With patient Time For Goal Achievement: 02/04/12 Potential to Achieve Goals: Good ADL Goals Pt Will Perform Grooming: with supervision;Standing at sink ADL Goal: Grooming - Progress: Goal set today Pt Will Perform Lower Body Bathing: with set-up;with supervision;Sit to stand from bed;with adaptive equipment ADL Goal: Lower Body Bathing - Progress: Goal set today Pt Will Perform Lower Body Dressing: with set-up;with supervision;Sit to stand from bed;with adaptive equipment ADL Goal: Lower Body Dressing - Progress: Goal set today Pt Will Transfer to Toilet: with supervision;Ambulation;Raised toilet seat with arms ADL Goal: Toilet Transfer - Progress: Goal set today Pt Will Perform Tub/Shower Transfer: Shower transfer;with supervision;Shower seat with back ADL Goal: Web designer - Progress: Goal set today Additional ADL Goal #1: Pt. will use 2 energy conservation techniques with an ADL task ADL Goal: Additional Goal #1 - Progress: Goal set today  Visit Information  Last OT Received On:  01/21/12 Assistance Needed: +1 PT/OT Co-Evaluation/Treatment: Yes    Subjective Data  Subjective: "I can't go home today" Patient Stated Goal: "I don't want to go anywhere for rehab"   Prior Functioning  Home Living Lives With: Alone Available Help at Discharge: Friend(s);Neighbor;Available PRN/intermittently Type of Home: Mobile home Home Access: Stairs  to enter Entrance Stairs-Number of Steps: 4-5 steps Entrance Stairs-Rails: Can reach both Home Layout: One level Bathroom Shower/Tub: Health visitor: Handicapped height Home Adaptive Equipment: Long-handled sponge;Grab bars in shower;Walker - rolling;Straight cane Prior Function Level of Independence: Independent Able to Take Stairs?: Yes Driving: Yes Vocation: Retired Comments: Pt. does grocery shopping, driving, bathing and dressing independently Communication Communication: No difficulties    Cognition  Overall Cognitive Status: Appears within functional limits for tasks assessed/performed Arousal/Alertness: Awake/alert Orientation Level: Appears intact for tasks assessed Behavior During Session: Anxious Cognition - Other Comments: Pt. anxious about going home and not moving/feeling well there       Mobility Bed Mobility Bed Mobility: Supine to Sit;Sit to Supine;Scooting to [facility] Supine to Sit: HOB elevated;With rails;5: Supervision Scooting to [facility]: 5: Supervision Details for Bed Mobility Assistance: Min verbal cues for use of bed rail to complete transfer Transfers Transfers: Sit to Stand Sit to Stand: 5: Supervision;With upper extremity assist;From bed;From toilet Stand to Sit: 5: Supervision;To chair/3-in-1;With upper extremity assist Details for Transfer Assistance: Min verbal cues for safe hand placement and to maintain upright position         End of Session OT - End of Session Equipment Utilized During Treatment: Gait belt Activity Tolerance: Patient limited by fatigue Patient left: in  chair;with call bell/phone within reach Nurse Communication: Mobility status;Other (comment) (SpO2 levels of 82-85% on 4LO2)   Amel Gianino, OTR/L Pager [phone] 01/21/2012, 10:26 AM

## 2012-01-21 NOTE — Progress Notes (Addendum)
Notified by [name] CMRN, patient requesting services of [facility] after discharge.   Patient currently going into third benefit period for HMB. face to face done by Dr Kern Reap, [facility] staff physician  hospice eligible with dx: COPD.  Spoke with patient at bedside to review education related to hospice services, philosophy and team approach to care with good understanding voiced by Ms Kaitlin Henderson. She voiced she is familiar with services and very appreciative of being able to return home with [facility] following.  No firm discharge date noted in chart - patient voiced plan to travel home by friend's personal vehicle -she stated she had travel O2 -  O2 for travel will need to be available for transport - patient is currently on 4-4.5 LNC continuous with O2 sats in mid 80's - patient stated she believes her current O2 concentrator will go to 5 LNC  DME was discussed patient currently has O2 and walker in the home- declines additional DME except for a 3n1 BSC which can be used to elevate the existing commode;  Initial paperwork faxed to [facility] in anticipation of d/c when patient medically ready  Please notify [facility] when patient is ready to leave unit at d/c call [phone] (or [phone] if after 5 pm); [facility] information and contact numbers also given to patient during visit.   Above information shared with [name] CMRN Please call with any questions or concerns  Valente David, RN 01/21/2012, 11:40 AM [facility] Palliative Medicine Team RN Liaison [phone]

## 2012-01-21 NOTE — Telephone Encounter (Signed)
Left msg on vm pt is currently in [facility] will be discharging to day with hospice. Wanting to know will Dr. Felicity Coyer be the attending md. Janae Bridgeman back she stated never mind Dr. Delford Field already stated that the will be the attending md,,,, 01/21/12@3 :46pm/LMB

## 2012-01-21 NOTE — Progress Notes (Signed)
Follow up  Palliative Medicine Team recieved a hospice consult; was notified by Dr Ladona Ridgel PMT who spoke with attending team- goc/symptom management not needed at this time - call placed and spoke with Selena Batten [facility] to request hospice choice be offered;  will await call back from [facility]  to advise on patient's decision on choice.    Valente David, RN Palliative Medicine Team RN Liaison [phone]

## 2012-01-21 NOTE — Telephone Encounter (Signed)
I spoke with Kaitlin Henderson and she is wanting to know if PW will be the attending physician and if would like symptom management from hospice doctors. She states he was the attending in the past but still has to get okay from PW. I advised her he is currently out of the office until Thursday. Please advise Dr. Delford Field, thanks

## 2012-01-21 NOTE — Care Management Note (Addendum)
    Page 1 of 2   01/22/2012     2:21:57 PM   CARE MANAGEMENT NOTE 01/22/2012  Patient:  Kaitlin Henderson, Kaitlin Henderson   Account Number:  [id number]  Date Initiated:  01/21/2012  Documentation initiated by:  Fransico Michael  Subjective/Objective Assessment:   admitted on 01/17/12 with c/o SOB     Action/Plan:   prior to admission, patient lived at home alone. [facility]   Anticipated DC Date:  01/24/2012   Anticipated DC Plan:  HOME W HOSPICE CARE      DC Planning Services  CM consult      PAC Choice  HOSPICE   Choice offered to / List presented to:  C-1 Patient   DME arranged  [facility]  3-N-1  [facility]YGEN      DME agency  [facility]     [facility] arranged  HH-1 RN      [facility] agency  [facility]   Status of service:  In process, will continue to follow Medicare Important Message given?   (If response is "NO", the following Medicare IM given date fields will be blank) Date Medicare IM given:   Date Additional Medicare IM given:    Discharge Disposition:    Per UR Regulation:  Reviewed for med. necessity/level of care/duration of stay  If discussed at Long Length of Stay Meetings, dates discussed:    Comments:  01/21/12-1123-J.Minnich,RN,BSN [phone]      In to speak with patient regarding need for hospice care at home. Choice of home hospice agencies offered to patient. List given to patient. Patient chose [facility]. Margie, RN with [facility] notified of referral. CM will continue to monitor for home/discharge needs.

## 2012-01-22 DIAGNOSIS — I214 Non-ST elevation (NSTEMI) myocardial infarction: Secondary | ICD-10-CM

## 2012-01-22 DIAGNOSIS — J438 Other emphysema: Secondary | ICD-10-CM

## 2012-01-22 DIAGNOSIS — D696 Thrombocytopenia, unspecified: Secondary | ICD-10-CM

## 2012-01-22 DIAGNOSIS — J9600 Acute respiratory failure, unspecified whether with hypoxia or hypercapnia: Secondary | ICD-10-CM

## 2012-01-22 LAB — GLUCOSE, CAPILLARY: Glucose-Capillary: 115 mg/dL — ABNORMAL HIGH (ref 70–99)

## 2012-01-22 MED ORDER — FUROSEMIDE 10 MG/ML IJ SOLN
40.0000 mg | Freq: Every day | INTRAMUSCULAR | Status: DC
  Filled 2012-01-22: qty 4

## 2012-01-22 MED ORDER — MORPHINE SULFATE 2 MG/ML IJ SOLN
1.0000 mg | Freq: Once | INTRAMUSCULAR | Status: DC
  Filled 2012-01-22: qty 1

## 2012-01-22 MED ORDER — FUROSEMIDE 10 MG/ML IJ SOLN
20.0000 mg | Freq: Once | INTRAMUSCULAR | Status: AC
  Administered 2012-01-22: 20 mg via INTRAVENOUS
  Filled 2012-01-22: qty 2

## 2012-01-22 NOTE — Progress Notes (Signed)
Palliative Medicine Team consult for goals of care called to phone by Dr Isidoro Donning; spoke with patient at bedside she does not want to involve any family or friends in this conversation agreeable to talk with team this morning - PMT provider will meet this morning with patient  Kaitlin David, RN 01/22/2012, 11:00 AM Palliative Medicine Team RN Liaison [phone]

## 2012-01-22 NOTE — Progress Notes (Signed)
PT Cancellation Note  Treatment cancelled today due to medical issues with patient which prohibited therapy.  Pt O2 sats at 83 resting in bed on 5L O2 via nasal cannula.  MD has been called to assess pt (per pt report).  Pt refusing to participate stating that she does not feel up to it.  Jaykwon Morones 01/22/2012, 10:47 AM Theron Arista L. Jamielee Mchale DPT [phone]

## 2012-01-22 NOTE — Progress Notes (Signed)
Palliative Medicine Team SW Referred for psychosocial support. Pt a bit agitated, asked for "peace and quiet" and states she is "trying to get my oxygen up". Pt expressed her frustration with multiple parties asking her to make decisions that she "needs to think about". Pt did mention that she would like to "say I love you one more time" to the hospice aide that cared for her prior to hospitalization; I will look into it. Pt also agreeable for a return visit at a later time. Will follow up.   Kennieth Francois, [place] Pager [phone]

## 2012-01-22 NOTE — Progress Notes (Signed)
This NP meet briefly at the bedside, pt easily agitated when conversation reflected decisions related to GOC and disposition.  Patient asked me to call her neighbor Selina Cooley # [phone] for support.  He agrees to meet tomorrow at 200pm to support patient with a GOC meeting.  I also spoke with  other "support person" at patients request, Zenia Resides  # [phone], who claims she has had very little to do with this patient over the past several years and is unable to provide support to her at home at this time.  Patient has children whom she is estranged from and refuses to allow contact.  Patient at this time has capacity to make her own decisions.  Hopefully tomorrows meeting will be more constructive.  Lorinda Creed NP

## 2012-01-22 NOTE — Progress Notes (Signed)
Arrived to bedside to give morning meds.  Checked 02 sat and pt 82% on 4.5L/02 via [place].  Pt placed on venti mask at 50% with 02 at 15L.  02 sats returned to 88%.  MD has been made aware.  New orders given.  Will continue to monitor. Nino Glow RN

## 2012-01-22 NOTE — Progress Notes (Signed)
Patient ID: Kaitlin Henderson  female  ZOX:[id number]    DOB: 10/06/1940    DOA: 01/17/2012  PCP: [name], MD, MD  Subjective: Was called by RN in a.m. for hypoxia, placed on NRB mask    Objective: Weight change: 0 kg (0 lb)  Intake/Output Summary (Last 24 hours) at 01/22/12 1437 Last data filed at 01/22/12 1300  Gross per 24 hour  Intake    846 ml  Output    675 ml  Net    171 ml   Blood pressure 118/74, pulse 104, temperature 98 F (36.7 C), temperature source Oral, resp. rate 22, height 5\' 2"  (1.575 m), weight 83.7 kg (184 lb 8.4 oz), SpO2 90.00%.  Physical Exam: General: Alert and awake, oriented x3, not in any acute distress. HEENT: anicteric sclera, pupils reactive to light and accommodation, EOMI CVS: S1-S2 clear, no murmur rubs or gallops Chest: Decreased breath sounds throughout bilaterally Abdomen: soft nontender, nondistended, normal bowel sounds, no organomegaly Extremities: no cyanosis, clubbing or edema noted bilaterally Neuro: Cranial nerves II-XII intact, no focal neurological deficits  Lab Results: Basic Metabolic Panel:  Lab 01/18/12 8119 01/17/12 2152 01/17/12 1322  NA 141 -- 139  K 4.0 -- 4.8  CL 98 -- 96  CO2 25 -- 20  GLUCOSE 199* -- 208*  BUN 26* -- 19  CREATININE 0.99 0.92 --  CALCIUM 9.9 -- 10.0  MG -- -- --  PHOS -- -- --   CBC:  Lab 01/21/12 0545 01/18/12 0405 01/17/12 1322  WBC 11.9* 10.3 --  NEUTROABS -- -- 8.5*  HGB 14.6 15.1* --  HCT 44.2 44.0 --  MCV 99.3 97.6 --  PLT 274 252 --   Cardiac Enzymes:  Lab 01/19/12 0452 01/18/12 0827 01/17/12 2345  CKTOTAL 198* 210* 229*  CKMB 12.6* 14.9* 13.1*  CKMBINDEX -- -- --  TROPONINI 1.86* 2.48* 2.05*   BNP: No components found with this basename: POCBNP:2 CBG:  Lab 01/22/12 1200 01/22/12 0640 01/21/12 2141 01/21/12 1607 01/21/12 1125  GLUCAP 258* 103* 154* 282* 202*     Micro Results: Recent Results (from the past 240 hour(s))  CULTURE, BLOOD (ROUTINE X 2)     Status:  Normal (Preliminary result)   Collection Time   01/17/12  5:00 PM      Component Value Range Status Comment   Specimen Description BLOOD ARM LEFT   Final    Special Requests BOTTLES DRAWN AEROBIC AND ANAEROBIC 10CC   Final    Culture  Setup Time [id number]   Final    Culture     Final    Value:        BLOOD CULTURE RECEIVED NO GROWTH TO DATE CULTURE WILL BE HELD FOR 5 DAYS BEFORE ISSUING A FINAL NEGATIVE REPORT   Report Status PENDING   Incomplete   CULTURE, BLOOD (ROUTINE X 2)     Status: Normal (Preliminary result)   Collection Time   01/17/12  5:18 PM      Component Value Range Status Comment   Specimen Description BLOOD ARM RIGHT   Final    Special Requests BOTTLES DRAWN AEROBIC ONLY 10CC   Final    Culture  Setup Time [id number]   Final    Culture     Final    Value:        BLOOD CULTURE RECEIVED NO GROWTH TO DATE CULTURE WILL BE HELD FOR 5 DAYS BEFORE ISSUING A FINAL NEGATIVE REPORT   Report Status PENDING  Incomplete   MRSA PCR SCREENING     Status: Normal   Collection Time   01/18/12  3:43 AM      Component Value Range Status Comment   MRSA by PCR NEGATIVE  NEGATIVE  Final   CULTURE, EXPECTORATED SPUTUM-ASSESSMENT     Status: Normal   Collection Time   01/18/12 11:00 AM      Component Value Range Status Comment   Specimen Description SPUTUM   Final    Special Requests NONE   Final    Sputum evaluation     Final    Value: THIS SPECIMEN IS ACCEPTABLE. RESPIRATORY CULTURE REPORT TO FOLLOW.   Report Status 01/18/2012 FINAL   Final   CULTURE, RESPIRATORY     Status: Normal   Collection Time   01/18/12 11:00 AM      Component Value Range Status Comment   Specimen Description SPUTUM   Final    Special Requests NONE   Final    Gram Stain     Final    Value: MODERATE WBC PRESENT, PREDOMINANTLY PMN     RARE SQUAMOUS EPITHELIAL CELLS PRESENT     RARE GRAM POSITIVE COCCI IN PAIRS     RARE GRAM POSITIVE RODS   Culture NORMAL OROPHARYNGEAL FLORA   Final    Report Status  01/20/2012 FINAL   Final     Studies/Results: Dg Chest Portable 1 View  01/17/2012  *RADIOLOGY REPORT*  Clinical Data:  Shortness of breath, history CHF, COPD, smoking  PORTABLE CHEST - 1 VIEW  Comparison: Portable exam 1330 hours compared to 08/11/2007  Findings: Rotated to the right. Normal heart size and mediastinal contours for rotation. Slight increase in pulmonary vascular interstitial markings versus previous study, cannot exclude minimal edema or infection. No segmental consolidation, pleural effusion or pneumothorax. Pleural thickening or scarring at right apex appears stable. Bones appear diffusely demineralized. Surgical clips right axilla.  IMPRESSION: Increased interstitial markings versus previous exam, question minimal edema or infection.  Original Report Authenticated By: Lollie Marrow, M.D.    Medications: Scheduled Meds:   . ipratropium  0.5 mg Nebulization Q6H   And  . albuterol  2.5 mg Nebulization Q6H  . antiseptic oral rinse  15 mL Mouth Rinse q12n4p  . aspirin EC  81 mg Oral Daily  . bacitracin-polymyxin b  1 application Left Eye Q4H  . budesonide  0.25 mg Nebulization Q6H  . chlorhexidine  15 mL Mouth/Throat BID  . enoxaparin (LOVENOX) injection  40 mg Subcutaneous Q24H  . furosemide  20 mg Intravenous Once  . furosemide  40 mg Oral Daily  . glimepiride  1 mg Oral BID WC  . insulin aspart  0-15 Units Subcutaneous TID WC  . levofloxacin  750 mg Oral Q24H  .  morphine injection  1 mg Intravenous Once  . pantoprazole  40 mg Oral Daily  . potassium chloride SA  20 mEq Oral BID  . predniSONE  40 mg Oral Q breakfast  . sertraline  50 mg Oral Daily  . sodium chloride  3 mL Intravenous Q12H   Continuous Infusions:    Assessment/Plan:  1. Acute respiratory failure:  - Improved rapidly during hospitalization, but now has relapsed. - placed on NRB mask today, IV 20 mg Lasix, patient refused morphine - continue empiric treatment for pneumonia and COPD  exacerbation.  2. Community acquired pneumonia: Appears worse today. Continue levofloxacin.  3. COPD exacerbation/oxygen-dependent COPD:  continue Pulmicort, prednisone, antibiotics, nebulizer treatments, supplemental oxygen.  4. Non-ST elevation MI: Asymptomatic. Most likely reflects strain rather than acute coronary syndrome/primary event. Completed 48 hours of therapeutic Lovenox. Continue aspirin. No beta blocker secondary to COPD. 2-D echocardiogram showed EF of 60-65% with no regional wall motion abnormalities. Conservative management. Not an interventional candidate.  5. Elevated BNP: 2-D echocardiogram revealed diastolic dysfunction. Placed her on IV Lasix today.  6. Conjunctivitis left eye: Stable. Continue empiric ophthalmic drops.  7. Diabetes mellitus type 2: Stable. Continue sliding scale insulin and Amaryl. Hold metformin while hospitalized.  8. Goals of care: Patient at this point somewhat worse today with more hypoxia and shortness of breath. She is under hospice care outpatient, lives alone. Discussed in detail with the patient and had ordered palliative medicine consult for possible residential hospice placement, goals of care and symptom management. Although patient has a realistic outlook, she declined to talk to palliative medicine NP, she refused to be transitioned to palliative floor and wants to continue current management on 4700.  DVT Prophylaxis: Lovenox  Code Status: DO NOT RESUSCITATE  Disposition: Not medically ready   LOS: 5 days   Almon Whitford M.D. [facility] 01/22/2012, 2:37 PM Pager: [phone]

## 2012-01-22 NOTE — Telephone Encounter (Signed)
Called, spoke with [facility] with Hospice.  I informed her Dr. Delford Field ok with being attending and hospice drs assisting with symptom management.  She verbalized understanding of this and voiced no further questions/concerns at this time.

## 2012-01-23 ENCOUNTER — Inpatient Hospital Stay (HOSPITAL_COMMUNITY): Payer: Medicare Other

## 2012-01-23 DIAGNOSIS — R5381 Other malaise: Secondary | ICD-10-CM

## 2012-01-23 DIAGNOSIS — J438 Other emphysema: Secondary | ICD-10-CM

## 2012-01-23 DIAGNOSIS — R5383 Other fatigue: Secondary | ICD-10-CM

## 2012-01-23 DIAGNOSIS — J9600 Acute respiratory failure, unspecified whether with hypoxia or hypercapnia: Secondary | ICD-10-CM

## 2012-01-23 DIAGNOSIS — I214 Non-ST elevation (NSTEMI) myocardial infarction: Secondary | ICD-10-CM

## 2012-01-23 DIAGNOSIS — R0602 Shortness of breath: Secondary | ICD-10-CM

## 2012-01-23 DIAGNOSIS — F411 Generalized anxiety disorder: Secondary | ICD-10-CM

## 2012-01-23 DIAGNOSIS — D696 Thrombocytopenia, unspecified: Secondary | ICD-10-CM

## 2012-01-23 LAB — GLUCOSE, CAPILLARY: Glucose-Capillary: 103 mg/dL — ABNORMAL HIGH (ref 70–99)

## 2012-01-23 MED ORDER — SIMVASTATIN 40 MG PO TABS
40.0000 mg | ORAL_TABLET | Freq: Every day | ORAL | Status: DC
  Administered 2012-01-23 – 2012-01-27 (×5): 40 mg via ORAL
  Filled 2012-01-23 (×6): qty 1

## 2012-01-23 MED ORDER — PREDNISONE 10 MG PO TABS
30.0000 mg | ORAL_TABLET | Freq: Two times a day (BID) | ORAL | Status: DC
  Administered 2012-01-23 – 2012-01-24 (×2): 30 mg via ORAL
  Filled 2012-01-23 (×4): qty 1

## 2012-01-23 MED ORDER — GUAIFENESIN ER 600 MG PO TB12
1200.0000 mg | ORAL_TABLET | Freq: Two times a day (BID) | ORAL | Status: DC
  Administered 2012-01-23 – 2012-01-28 (×11): 1200 mg via ORAL
  Filled 2012-01-23 (×12): qty 2

## 2012-01-23 MED ORDER — FUROSEMIDE 10 MG/ML IJ SOLN
40.0000 mg | Freq: Two times a day (BID) | INTRAMUSCULAR | Status: DC
  Administered 2012-01-23 – 2012-01-24 (×3): 40 mg via INTRAVENOUS
  Filled 2012-01-23 (×6): qty 4

## 2012-01-23 MED ORDER — ALPRAZOLAM 0.5 MG PO TABS
0.5000 mg | ORAL_TABLET | Freq: Three times a day (TID) | ORAL | Status: DC
  Administered 2012-01-23 – 2012-01-28 (×15): 0.5 mg via ORAL
  Filled 2012-01-23 (×15): qty 1

## 2012-01-23 NOTE — Progress Notes (Signed)
Name: Kaitlin Henderson MRN: [id number] DOB: 02-18-1941    LOS: 6  History of Present Illness:  71 year old woman with history of oxygen-dependent COPD Gold D   Subj:  Still dyspneic and on oxygen 15L VM  Cultures: BC X 2 5/17>>>neg Sputum  5/18: rare gpc pairs and rare gpr>>>nl flore   Antibiotics: levaquin 5/17>>>  Tests / Events: Echo 5/18: Left ventricle: The cavity size was normal. Wall thickness was normal. Systolic function was normal. The estimated  ejection fraction was in the range of 60% to 65%. Wall motion was normal; there were no regional wall motion abnormalities. Doppler parameters are consistent with abnormal left ventricular relaxation (grade 1 diastolic dysfunction).   Past Medical History  Diagnosis Date  . Breast cancer, right breast 1998    s/p chemo & XRT, right mastectomy  . OBESITY   . CORONARY ARTERY DISEASE   . APHTHOUS STOMATITIS   . DEPRESSION     started sertraline 09/2010  . DIABETES, TYPE 2 dx 03/2010  . HYPERLIPIDEMIA   . Hypothyroidism   . C O P D     chronic O2 3LPM [place]  . Essential tremor    Past Surgical History  Procedure Date  . Appendectomy   . Cholecystectomy   . Abdominal hysterectomy   . Right mastectomy   . Tonsillectomy    Prior to Admission medications   Medication Sig Start Date End Date Taking? Authorizing Provider  albuterol (PROAIR HFA) 108 (90 BASE) MCG/ACT inhaler Inhale 2 puffs into the lungs every 6 (six) hours as needed for wheezing. 12/04/11 12/03/12 Yes Storm Frisk, MD  albuterol (PROVENTIL) (2.5 MG/3ML) 0.083% nebulizer solution Take 2.5 mg by nebulization 4 (four) times daily as needed. For shortness of breath   Yes Historical Provider, MD  ALPRAZolam (XANAX) 0.5 MG tablet Take 1 tablet (0.5 mg total) by mouth 2 (two) times daily. 09/13/11  Yes Newt Lukes, MD  Ascorbic Acid (VITAMIN C) 500 MG tablet Take 500 mg by mouth daily.     Yes Historical Provider, MD  aspirin 81 MG tablet Take 81 mg  by mouth daily.     Yes Historical Provider, MD  benzonatate (TESSALON) 100 MG capsule Take 100 mg by mouth every 8 (eight) hours as needed. For cough   Yes Historical Provider, MD  budesonide-formoterol (SYMBICORT) 160-4.5 MCG/ACT inhaler Inhale 2 puffs into the lungs 2 (two) times daily. 10/07/11 10/06/12 Yes Storm Frisk, MD  esomeprazole (NEXIUM) 40 MG capsule Take 40 mg by mouth daily before breakfast.   Yes Historical Provider, MD  furosemide (LASIX) 40 MG tablet Take 40 mg by mouth daily.   Yes Historical Provider, MD  glimepiride (AMARYL) 2 MG tablet Take 1 mg by mouth 2 (two) times daily. Take 1/2 by mouth every morning if cbg > 150   Yes Historical Provider, MD  Glucose Blood (ONETOUCH ULTRA BLUE VI) by In Vitro route. Use two times a day to check blood sugars    Yes Historical Provider, MD  metFORMIN (GLUCOPHAGE-XR) 500 MG 24 hr tablet Take 1 tablet (500 mg total) by mouth 2 (two) times daily. 11/25/11  Yes Newt Lukes, MD  Multiple Vitamins-Minerals (CENTRUM SILVER) tablet Take 1 tablet by mouth daily.     Yes Historical Provider, MD  Omega-3 Fatty Acids (FISH OIL) 1200 MG CAPS Take by mouth 2 (two) times daily.     Yes Historical Provider, MD  ONE TOUCH LANCETS MISC by  Does not apply route. Use two times a day to check blood sugars     Yes Historical Provider, MD  ONE TOUCH ULTRA TEST test strip CHECK BLOOD SUGAR TWO TIMES A DAY DX: 250.00 06/04/11  Yes Newt Lukes, MD  potassium chloride SA (KLOR-CON M20) 20 MEQ tablet Take 1 tablet (20 mEq total) by mouth 2 (two) times daily. 01/07/12 01/07/13 Yes Storm Frisk, MD  pravastatin (PRAVACHOL) 40 MG tablet Take 40 mg by mouth daily.   Yes Historical Provider, MD  predniSONE (DELTASONE) 10 MG tablet Take 10 mg by mouth daily.   Yes Historical Provider, MD  sennosides-docusate sodium (SENOKOT-S) 8.6-50 MG tablet Take 1 tablet by mouth at bedtime as needed. For constipation   Yes Historical Provider, MD  sertraline (ZOLOFT) 50 MG  tablet Take 1 tablet (50 mg total) by mouth daily. 11/25/11  Yes Newt Lukes, MD  tiotropium (SPIRIVA) 18 MCG inhalation capsule Place 1 capsule (18 mcg total) into inhaler and inhale daily. 10/07/11 10/06/12 Yes Storm Frisk, MD  vitamin E 400 UNIT capsule Take 400 Units by mouth 2 (two) times daily.    Yes Historical Provider, MD    Allergies Allergies  Allergen Reactions  . Codeine     REACTION: makes pt pass out  . Levaquin (Levofloxacin In D5w) Other (See Comments)    redness  . Sulfonamide Derivatives     REACTION: edema    Family History Family History  Problem Relation Age of Onset  . Diabetes Neg Hx   . Cancer Neg Hx     Social History  reports that she quit smoking about 5 years ago. Her smoking use included Cigarettes. She has a 135 pack-year smoking history. She has never used smokeless tobacco. She reports that she does not drink alcohol or use illicit drugs.   Vital Signs: BP 137/84  Pulse 97  Temp(Src) 97.9 F (36.6 C) (Oral)  Resp 20  Ht 5\' 2"  (1.575 m)  Wt 83.5 kg (184 lb 1.4 oz)  BMI 33.67 kg/m2  SpO2 94%      Intake/Output Summary (Last 24 hours) at 01/23/12 1610 Last data filed at 01/22/12 2018  Gross per 24 hour  Intake    723 ml  Output    875 ml  Net   -152 ml    Physical Examination: General: 71 year old female in no acute distress.  Neuro:  Awake, oriented X 3, no focal def HEENT:  [place], no JVD Cardiovascular:rrr Lungs:  Exp wheeze, distant BS Abdomen:non-tender, + bowel sounds Musculoskeletal:intacts Skin:scattered area of ecchymosis   Labs and Imaging:   Lab 01/18/12 0405 01/17/12 2152 01/17/12 1322  NA 141 -- 139  K 4.0 -- 4.8  CL 98 -- 96  CO2 25 -- 20  BUN 26* -- 19  CREATININE 0.99 0.92 0.85  GLUCOSE 199* -- 208*    Lab 01/21/12 0545 01/18/12 0405 01/17/12 2152  HGB 14.6 15.1* 15.7*  HCT 44.2 44.0 44.1  WBC 11.9* 10.3 11.1*  PLT 274 252 242     ASSESSMENT AND PLAN  Principal Problem:  *Acute  respiratory failure Active Problems:  DIABETES, TYPE 2  C O P D  Community acquired pneumonia  COPD exacerbation  NSTEMI (non-ST elevated myocardial infarction)  Conjunctivitis of left eye   Acute on Chronic Respiratory Failure in the setting of CAP, c/b NSTEMI super imposed on underlying Gold stage IV COPD    I have been out of town. I apprec  care of [facility] and Dr Craige Cotta. STill quite hypoxic ?why.  Pt agrees to NCB but not ready for SNF or [facility]. She will accept home hospice.  15L VM too high FIO2 for home care  Plan: Repeat cxr Diurese more Increase steroids Add simvastatin Adjust nebs Flutter valve Titrate oxygen for spo2> 88% Prob does not need TELE See orders I will chk pt again 5/24         [name]  Beeper  [phone]  Cell  [phone]  If no response or cell goes to voicemail, call beeper [phone]   01/23/2012, 8:11 AM

## 2012-01-23 NOTE — Progress Notes (Signed)
Palliative Medicine Team SW  Follow up psychosocial visit with pt at bedside. Pt more talkative, calmer today. Pt reports today is the 1 year anniversary of her mother's death. Allowed pt to express her grief and also to reminisce on the good times of what she describes as a "difficult" relationship.   Pt requesting to reschedule GOC mtg with PMT NP until tomorrow; states she "just can't handle it today". We did, however, discuss her desire to have an at-home death. Pt expressed that she is "not ready for [employer] yet" and is not interested in a SNF. Pt states she feels that having someone "check in on her" 1-2x/day, but also concedes that she needs someone to provide personal care and dinner. Pt understands that she does not have a reliable careplan nor individuals to take on caregiver responsibilities. Pt also unable to give a definitive answer about whether she would want to continue to be hospitalized for future COPD exacerbations.  Attempted to provide education for the need for a caregiver under home with hospice services, but pt remains unrealistic about care needs.   Discussed all of this with CSW as well as PMT NP Lorinda Creed. Will continue to be available for support, disposition likely to be managed by RN/CM.   Kennieth Francois, [place] Pager [phone]

## 2012-01-23 NOTE — Progress Notes (Signed)
Patient ID: Kaitlin [facility]  female  EAV:[id number]    DOB: 1941/07/15    DOA: 01/17/2012  PCP: [name], MD, MD  Subjective: Breathing slightly better today, but appears somewhat depressed  Objective: Weight change: -0.2 kg (-7.1 oz)  Intake/Output Summary (Last 24 hours) at 01/23/12 1438 Last data filed at 01/23/12 1300  Gross per 24 hour  Intake    823 ml  Output   1875 ml  Net  -1052 ml   Blood pressure 138/81, pulse 108, temperature 97.9 F (36.6 C), temperature source Oral, resp. rate 20, height 5\' 2"  (1.575 m), weight 83.5 kg (184 lb 1.4 oz), SpO2 88.00%.  Physical Exam: General: Alert and awake, oriented x3, not in any acute distress. HEENT: anicteric sclera, pupils reactive to light and accommodation, EOMI CVS: S1-S2 clear, no murmur rubs or gallops Chest: Decreased breath sounds throughout bilaterally Abdomen: soft nontender, nondistended, normal bowel sounds, no organomegaly Extremities: no cyanosis, clubbing or edema noted bilaterally   Lab Results: Basic Metabolic Panel:  Lab 01/18/12 7829 01/17/12 2152 01/17/12 1322  NA 141 -- 139  K 4.0 -- 4.8  CL 98 -- 96  CO2 25 -- 20  GLUCOSE 199* -- 208*  BUN 26* -- 19  CREATININE 0.99 0.92 --  CALCIUM 9.9 -- 10.0  MG -- -- --  PHOS -- -- --   CBC:  Lab 01/21/12 0545 01/18/12 0405 01/17/12 1322  WBC 11.9* 10.3 --  NEUTROABS -- -- 8.5*  HGB 14.6 15.1* --  HCT 44.2 44.0 --  MCV 99.3 97.6 --  PLT 274 252 --   Cardiac Enzymes:  Lab 01/19/12 0452 01/18/12 0827 01/17/12 2345  CKTOTAL 198* 210* 229*  CKMB 12.6* 14.9* 13.1*  CKMBINDEX -- -- --  TROPONINI 1.86* 2.48* 2.05*   BNP: No components found with this basename: POCBNP:2 CBG:  Lab 01/23/12 1119 01/23/12 0620 01/22/12 2155 01/22/12 1626 01/22/12 1200  GLUCAP 246* 103* 115* 273* 258*     Micro Results: Recent Results (from the past 240 hour(s))  CULTURE, BLOOD (ROUTINE X 2)     Status: Normal (Preliminary result)   Collection Time   01/17/12  5:00 PM      Component Value Range Status Comment   Specimen Description BLOOD ARM LEFT   Final    Special Requests BOTTLES DRAWN AEROBIC AND ANAEROBIC 10CC   Final    Culture  Setup Time [id number]   Final    Culture     Final    Value:        BLOOD CULTURE RECEIVED NO GROWTH TO DATE CULTURE WILL BE HELD FOR 5 DAYS BEFORE ISSUING A FINAL NEGATIVE REPORT   Report Status PENDING   Incomplete   CULTURE, BLOOD (ROUTINE X 2)     Status: Normal (Preliminary result)   Collection Time   01/17/12  5:18 PM      Component Value Range Status Comment   Specimen Description BLOOD ARM RIGHT   Final    Special Requests BOTTLES DRAWN AEROBIC ONLY 10CC   Final    Culture  Setup Time [id number]   Final    Culture     Final    Value:        BLOOD CULTURE RECEIVED NO GROWTH TO DATE CULTURE WILL BE HELD FOR 5 DAYS BEFORE ISSUING A FINAL NEGATIVE REPORT   Report Status PENDING   Incomplete   MRSA PCR SCREENING     Status: Normal   Collection Time  01/18/12  3:43 AM      Component Value Range Status Comment   MRSA by PCR NEGATIVE  NEGATIVE  Final   CULTURE, EXPECTORATED SPUTUM-ASSESSMENT     Status: Normal   Collection Time   01/18/12 11:00 AM      Component Value Range Status Comment   Specimen Description SPUTUM   Final    Special Requests NONE   Final    Sputum evaluation     Final    Value: THIS SPECIMEN IS ACCEPTABLE. RESPIRATORY CULTURE REPORT TO FOLLOW.   Report Status 01/18/2012 FINAL   Final   CULTURE, RESPIRATORY     Status: Normal   Collection Time   01/18/12 11:00 AM      Component Value Range Status Comment   Specimen Description SPUTUM   Final    Special Requests NONE   Final    Gram Stain     Final    Value: MODERATE WBC PRESENT, PREDOMINANTLY PMN     RARE SQUAMOUS EPITHELIAL CELLS PRESENT     RARE GRAM POSITIVE COCCI IN PAIRS     RARE GRAM POSITIVE RODS   Culture NORMAL OROPHARYNGEAL FLORA   Final    Report Status 01/20/2012 FINAL   Final     Studies/Results: Dg  Chest Portable 1 View  01/17/2012  *RADIOLOGY REPORT*  Clinical Data:  Shortness of breath, history CHF, COPD, smoking  PORTABLE CHEST - 1 VIEW  Comparison: Portable exam 1330 hours compared to 08/11/2007  Findings: Rotated to the right. Normal heart size and mediastinal contours for rotation. Slight increase in pulmonary vascular interstitial markings versus previous study, cannot exclude minimal edema or infection. No segmental consolidation, pleural effusion or pneumothorax. Pleural thickening or scarring at right apex appears stable. Bones appear diffusely demineralized. Surgical clips right axilla.  IMPRESSION: Increased interstitial markings versus previous exam, question minimal edema or infection.  Original Report Authenticated By: Lollie Marrow, M.D.    Medications: Scheduled Meds:    . ipratropium  0.5 mg Nebulization Q6H   And  . albuterol  2.5 mg Nebulization Q6H  . ALPRAZolam  0.5 mg Oral TID  . antiseptic oral rinse  15 mL Mouth Rinse q12n4p  . aspirin EC  81 mg Oral Daily  . bacitracin-polymyxin b  1 application Left Eye Q4H  . chlorhexidine  15 mL Mouth/Throat BID  . enoxaparin (LOVENOX) injection  40 mg Subcutaneous Q24H  . furosemide  40 mg Intravenous Q12H  . glimepiride  1 mg Oral BID WC  . guaiFENesin  1,200 mg Oral BID  . insulin aspart  0-15 Units Subcutaneous TID WC  . levofloxacin  750 mg Oral Q24H  .  morphine injection  1 mg Intravenous Once  . pantoprazole  40 mg Oral Daily  . potassium chloride SA  20 mEq Oral BID  . predniSONE  30 mg Oral BID WC  . sertraline  50 mg Oral Daily  . simvastatin  40 mg Oral q1800  . sodium chloride  3 mL Intravenous Q12H  . DISCONTD: budesonide  0.25 mg Nebulization Q6H  . DISCONTD: furosemide  40 mg Intravenous Daily  . DISCONTD: furosemide  40 mg Intravenous Daily  . DISCONTD: furosemide  40 mg Oral Daily  . DISCONTD: predniSONE  40 mg Oral Q breakfast   Continuous Infusions:    Assessment/Plan:  1. Acute  respiratory failure: Still dyspneic, Improved during hospitalization, but now has relapsed. Somewhat improving after starting IV Lasix yesterday  - cont O2  supplementation, continue IV Lasix (increased to BID today), repeat chest x-ray stable  - continue empiric treatment for pneumonia and COPD exacerbation. - appreciate pulmonology recommendations by Dr. Delford Field  2. Community acquired pneumonia:  Continue levofloxacin and #1.  3. COPD exacerbation/oxygen-dependent COPD:  continue Pulmicort, prednisone, antibiotics, nebulizer treatments, supplemental oxygen.  4. Non-ST elevation MI: Asymptomatic. Most likely reflects strain rather than acute coronary syndrome/primary event. Completed 48 hours of therapeutic Lovenox. Continue aspirin. No beta blocker secondary to COPD. 2-D echocardiogram showed EF of 60-65% with no regional wall motion abnormalities. Conservative management. Not an interventional candidate.  5. Elevated BNP: 2-D echocardiogram revealed diastolic dysfunction. Continue IV Lasix (increased to BID today).  6. Conjunctivitis left eye: Stable. Continue empiric ophthalmic drops.  7. Diabetes mellitus type 2: Stable. Continue sliding scale insulin and Amaryl. Hold metformin while hospitalized.  Goals of care: Unclear, she is under hospice care outpatient, lives alone. Palliative medicine consult following for goals of care however patient is currently reluctant to discuss anything about goals of care today due to her mother's passing away.  Prophylaxis: Lovenox  Code Status: DO NOT RESUSCITATE  Disposition: Not medically ready   LOS: 6 days   Kerri-Anne Haeberle M.D. [facility] 01/23/2012, 2:38 PM Pager: [phone]

## 2012-01-23 NOTE — Progress Notes (Signed)
UR Completed. Nyron Mozer, RN, Nurse Case Manager [phone]     

## 2012-01-23 NOTE — Progress Notes (Signed)
Nutrition Follow-up  Diet Order:  Heart healthy, PO intake 25% several meals  Pt denies any nutrition supplements. Would like to have snacks between meals and fruit with meals. RD will put these requests into [facility].   Meds: Scheduled Meds:   . ipratropium  0.5 mg Nebulization Q6H   And  . albuterol  2.5 mg Nebulization Q6H  . ALPRAZolam  0.5 mg Oral TID  . antiseptic oral rinse  15 mL Mouth Rinse q12n4p  . aspirin EC  81 mg Oral Daily  . bacitracin-polymyxin b  1 application Left Eye Q4H  . chlorhexidine  15 mL Mouth/Throat BID  . enoxaparin (LOVENOX) injection  40 mg Subcutaneous Q24H  . furosemide  40 mg Intravenous Q12H  . glimepiride  1 mg Oral BID WC  . guaiFENesin  1,200 mg Oral BID  . insulin aspart  0-15 Units Subcutaneous TID WC  . levofloxacin  750 mg Oral Q24H  .  morphine injection  1 mg Intravenous Once  . pantoprazole  40 mg Oral Daily  . potassium chloride SA  20 mEq Oral BID  . predniSONE  30 mg Oral BID WC  . sertraline  50 mg Oral Daily  . simvastatin  40 mg Oral q1800  . sodium chloride  3 mL Intravenous Q12H  . DISCONTD: budesonide  0.25 mg Nebulization Q6H  . DISCONTD: furosemide  40 mg Intravenous Daily  . DISCONTD: predniSONE  40 mg Oral Q breakfast   Continuous Infusions:  PRN Meds:.sodium chloride, albuterol, guaiFENesin-dextromethorphan, sodium chloride, DISCONTD: ALPRAZolam  Labs:  CMP     Component Value Date/Time   NA 141 01/18/2012 0405   K 4.0 01/18/2012 0405   CL 98 01/18/2012 0405   CO2 25 01/18/2012 0405   GLUCOSE 199* 01/18/2012 0405   BUN 26* 01/18/2012 0405   CREATININE 0.99 01/18/2012 0405   CALCIUM 9.9 01/18/2012 0405   PROT 6.7 09/25/2010 1340   ALBUMIN 4.0 09/25/2010 1340   AST 22 09/25/2010 1340   ALT 23 09/25/2010 1340   ALKPHOS 48 09/25/2010 1340   BILITOT 0.6 09/25/2010 1340   GFRNONAA 56* 01/18/2012 0405   GFRAA 65* 01/18/2012 0405   CBG (last 3)   Basename 01/23/12 1119 01/23/12 0620 01/22/12 2155  GLUCAP 246* 103* 115*         Intake/Output Summary (Last 24 hours) at 01/23/12 1526 Last data filed at 01/23/12 1511  Gross per 24 hour  Intake    703 ml  Output   2075 ml  Net  -1372 ml    Weight Status:  Stable   Re-estimated needs:  1675-1800 kcal, 60-70 gm protein  Nutrition Dx:  Altered nutrition-related lab values, ongoing Pt continues to have high CBG's on occasion   Goal: Intake will be adequate to meet nutrition needs, unmet  Intervention:   1. RD will add snacks between meals 2. RD will add pt preferred food items to health touch 3. RD will continue to follow  Monitor:  PO intake, weight, labs, I/O's   Rudean Haskell Pager #:  [phone]

## 2012-01-23 NOTE — Consult Note (Signed)
Consult Note from the Palliative Medicine Team at [facility] Patient Kaitlin Henderson      DOB: May 01, 1941      YQM:[id number]   Consult Requested by: Dr Isidoro Donning     PCP: [name], MD, MD Reason for Consultation:Calrification of GOC and Options  Phone [phone]  This patient at some point today  had verbalized not wanting to discuss "anything" today but at this time request a conversation with this NP with her friend and neighbor Romana Juniper  This NP Lorinda Creed reviewed medical records, received report from team, assessed the patient and then meet at the patient's bedside along with her neighbor/friend Cydney Ok support Romana Juniper [phone]  to discuss diagnosis prognosis, GOC, EOL wishes disposition and options.  Natural trajectory and expectations at EOL were discussed.  Questions and concerns addressed.  Hard Choices booklet left for review. Family encouraged to call with questions or concerns.  PMT will continue to support holistically.  Assessment and Plan: 1. Code Status:DNR/DNI 2. Symptom Control: pt refuses any opioids for dyspnea control, attempted to educate regarding sake use, risks and benefits 3. Psycho/Social:emotional support offered to patient in presence of Mr Gerre Pebbles, he detailed the reality that there is nobody to help her at home.  There are no friends or family able to assist her in her daily needs.  The patient sadly recognizes her situation.  4. Disposition:Although she wishes it was possible for a safe discharge home, she and her support person Raiford Noble feel that a discharge to [facility] would be in her best interest.  Her mother died at [facility] and she would feel comfortable there. I did discuss with them the process of  Bed availability  at [facility] and insurance plays a role in her dc plan.  This is a difficult situation.   Social work made aware of above  Brief HPI: Chronically ill, COPD, oxygen dependent, overall failure to thrive.   ROS: dyspnea,  weakness, fatigue,     PMH:  Past Medical History  Diagnosis Date  . Breast cancer, right breast 1998    s/p chemo & XRT, right mastectomy  . OBESITY   . CORONARY ARTERY DISEASE   . APHTHOUS STOMATITIS   . DEPRESSION     started sertraline 09/2010  . DIABETES, TYPE 2 dx 03/2010  . HYPERLIPIDEMIA   . Hypothyroidism   . C O P D     chronic O2 3LPM [place]  . Essential tremor      PSH: Past Surgical History  Procedure Date  . Appendectomy   . Cholecystectomy   . Abdominal hysterectomy   . Right mastectomy   . Tonsillectomy    I have reviewed the FH and SH and  If appropriate update it with new information. Allergies  Allergen Reactions  . Codeine     REACTION: makes pt pass out  . Levaquin (Levofloxacin In D5w) Other (See Comments)    redness  . Sulfonamide Derivatives     REACTION: edema   Scheduled Meds:   . ipratropium  0.5 mg Nebulization Q6H   And  . albuterol  2.5 mg Nebulization Q6H  . ALPRAZolam  0.5 mg Oral TID  . antiseptic oral rinse  15 mL Mouth Rinse q12n4p  . aspirin EC  81 mg Oral Daily  . bacitracin-polymyxin b  1 application Left Eye Q4H  . chlorhexidine  15 mL Mouth/Throat BID  . enoxaparin (LOVENOX) injection  40 mg Subcutaneous Q24H  . furosemide  40 mg Intravenous  Q12H  . glimepiride  1 mg Oral BID WC  . guaiFENesin  1,200 mg Oral BID  . insulin aspart  0-15 Units Subcutaneous TID WC  . levofloxacin  750 mg Oral Q24H  .  morphine injection  1 mg Intravenous Once  . pantoprazole  40 mg Oral Daily  . potassium chloride SA  20 mEq Oral BID  . predniSONE  30 mg Oral BID WC  . sertraline  50 mg Oral Daily  . simvastatin  40 mg Oral q1800  . sodium chloride  3 mL Intravenous Q12H  . DISCONTD: budesonide  0.25 mg Nebulization Q6H  . DISCONTD: furosemide  40 mg Intravenous Daily  . DISCONTD: predniSONE  40 mg Oral Q breakfast   Continuous Infusions:  PRN Meds:.sodium chloride, albuterol, guaiFENesin-dextromethorphan, sodium chloride, DISCONTD:  ALPRAZolam    BP 138/81  Pulse 108  Temp(Src) 97.9 F (36.6 C) (Oral)  Resp 20  Ht 5\' 2"  (1.575 m)  Wt 83.5 kg (184 lb 1.4 oz)  BMI 33.67 kg/m2  SpO2 88%   PPS:30%   Intake/Output Summary (Last 24 hours) at 01/23/12 1543 Last data filed at 01/23/12 1511  Gross per 24 hour  Intake    703 ml  Output   2075 ml  Net  -1372 ml   LBM: 01-23-12                   Physical Exam:  General: chronically ill appearing, dyspneic at rest HEENT:  PERRL, dry mouth membranes Chest:   Diminished, scattered exp wheeze CVS: tachycardic Abdomen:soft NT +BS Ext: trace edema,  Neuro:alert and oriented,  Psych easily agitated, with capacity  Labs: CBC    Component Value Date/Time   WBC 11.9* 01/21/2012 0545   RBC 4.45 01/21/2012 0545   HGB 14.6 01/21/2012 0545   HCT 44.2 01/21/2012 0545   PLT 274 01/21/2012 0545   MCV 99.3 01/21/2012 0545   MCH 32.8 01/21/2012 0545   MCHC 33.0 01/21/2012 0545   RDW 12.6 01/21/2012 0545   LYMPHSABS 1.7 01/17/2012 1322   MONOABS 0.8 01/17/2012 1322   EOSABS 0.0 01/17/2012 1322   BASOSABS 0.0 01/17/2012 1322    BMET    Component Value Date/Time   NA 141 01/18/2012 0405   K 4.0 01/18/2012 0405   CL 98 01/18/2012 0405   CO2 25 01/18/2012 0405   GLUCOSE 199* 01/18/2012 0405   BUN 26* 01/18/2012 0405   CREATININE 0.99 01/18/2012 0405   CALCIUM 9.9 01/18/2012 0405   GFRNONAA 56* 01/18/2012 0405   GFRAA 65* 01/18/2012 0405    CMP     Component Value Date/Time   NA 141 01/18/2012 0405   K 4.0 01/18/2012 0405   CL 98 01/18/2012 0405   CO2 25 01/18/2012 0405   GLUCOSE 199* 01/18/2012 0405   BUN 26* 01/18/2012 0405   CREATININE 0.99 01/18/2012 0405   CALCIUM 9.9 01/18/2012 0405   PROT 6.7 09/25/2010 1340   ALBUMIN 4.0 09/25/2010 1340   AST 22 09/25/2010 1340   ALT 23 09/25/2010 1340   ALKPHOS 48 09/25/2010 1340   BILITOT 0.6 09/25/2010 1340   GFRNONAA 56* 01/18/2012 0405   GFRAA 65* 01/18/2012 0405      Time In Time Out Total Time Spent with Patient Total Overall  Time  1400 1520 75 minutes 80 minutes    Greater than 50%  of this time was spent counseling and coordinating care related to the above assessment and plan.  Lorinda Creed  NP   [phone]

## 2012-01-23 NOTE — Progress Notes (Signed)
Inpatient Diabetes Program Recommendations  AACE/ADA: New Consensus Statement on Inpatient Glycemic Control (2009)  Target Ranges:  Prepandial:   less than 140 mg/dL      Peak postprandial:   less than 180 mg/dL (1-2 hours)      Critically ill patients:  140 - 180 mg/dL   Reason for Visit: .Post-prandial hyperglycemia requiring approximately 5-8 units before lunch and dinner.  Inpatient Diabetes Program Recommendations Insulin - Meal Coverage:  Please add low dose meal coverage- 3 units tidwc  Oral Agents: Please discontinue Amaryl while here.  PO intake is too variable.  Note: Thank you, Lenor Coffin, RN, CNS, Diabetes Coordinator [phone])

## 2012-01-23 NOTE — Progress Notes (Signed)
OT Cancellation Note  Treatment cancelled today due to patient's refusal to participate. Due to not feeling "up to move".  Philander Ake, OTR/L Pager [phone] 01/23/2012, 3:12 PM

## 2012-01-24 DIAGNOSIS — J9600 Acute respiratory failure, unspecified whether with hypoxia or hypercapnia: Secondary | ICD-10-CM

## 2012-01-24 DIAGNOSIS — J438 Other emphysema: Secondary | ICD-10-CM

## 2012-01-24 DIAGNOSIS — I214 Non-ST elevation (NSTEMI) myocardial infarction: Secondary | ICD-10-CM

## 2012-01-24 DIAGNOSIS — D696 Thrombocytopenia, unspecified: Secondary | ICD-10-CM

## 2012-01-24 LAB — CULTURE, BLOOD (ROUTINE X 2)
Culture  Setup Time: 201305180226
Culture: NO GROWTH

## 2012-01-24 LAB — CBC
HCT: 49.1 % — ABNORMAL HIGH (ref 36.0–46.0)
Platelets: 307 10*3/uL (ref 150–400)
RDW: 12.6 % (ref 11.5–15.5)
WBC: 16.2 10*3/uL — ABNORMAL HIGH (ref 4.0–10.5)

## 2012-01-24 LAB — GLUCOSE, CAPILLARY
Glucose-Capillary: 142 mg/dL — ABNORMAL HIGH (ref 70–99)
Glucose-Capillary: 237 mg/dL — ABNORMAL HIGH (ref 70–99)

## 2012-01-24 LAB — CREATININE, SERUM: GFR calc Af Amer: 64 mL/min — ABNORMAL LOW (ref >90)

## 2012-01-24 MED ORDER — FUROSEMIDE 40 MG PO TABS
40.0000 mg | ORAL_TABLET | Freq: Every day | ORAL | Status: DC
  Administered 2012-01-25: 40 mg via ORAL
  Filled 2012-01-24: qty 1

## 2012-01-24 MED ORDER — PREDNISONE 50 MG PO TABS
50.0000 mg | ORAL_TABLET | Freq: Every day | ORAL | Status: DC
  Administered 2012-01-25 – 2012-01-27 (×3): 50 mg via ORAL
  Filled 2012-01-24 (×4): qty 1

## 2012-01-24 NOTE — Progress Notes (Addendum)
Clinical Social Work Department CLINICAL SOCIAL WORK PLACEMENT NOTE 01/24/2012  Patient:  Kaitlin Henderson, Kaitlin Henderson  Account Number:  [id number] Admit date:  01/17/2012  Clinical Social Worker:  AMY Lubertha Basque  Date/time:  01/24/2012 11:00 AM  Clinical Social Work is seeking post-discharge placement for this patient at the following level of care:   SKILLED NURSING   (*CSW will update this form in Epic as items are completed)   01/24/2012  Patient/family provided with [facility] Department of Clinical Social Work's list of facilities offering this level of care within the geographic area requested by the patient (or if unable, by the patient's family).  01/24/2012  Patient/family informed of their freedom to choose among providers that offer the needed level of care, that participate in Medicare, Medicaid or managed care program needed by the patient, have an available bed and are willing to accept the patient.  01/24/2012  Patient/family informed of [facility]' ownership interest in [facility], as well as of the fact that they are under no obligation to receive care at this facility.  PASARR submitted to EDS on 01/24/2012 PASARR number received from EDS on 01/24/2012  FL2 transmitted to all facilities in geographic area requested by pt/family on  01/24/2012 FL2 transmitted to all facilities within larger geographic area on 01/24/2012  Patient informed that his/her managed care company has contracts with or will negotiate with  certain facilities, including the following:     Patient/family informed of bed offers received: 01/25/12  Patient chooses bed at  Physician recommends and patient chooses bed at    Patient to be transferred to  on   Patient to be transferred to facility by   The following physician request were entered in Epic:   Additional Comments: D/C is scheduled for Monday per Dr. Isidoro Donning. Patient will either go to SNF, go home with home health, or home with  Hospice.  01/25/12 - Patient given all bed offers but wants to remain in [facility]. Her preferences are [facility] and [facility], however patient advised that neither of these facilities have responded. Patient stated that she does not want either of the [facility].  Alvina Chou, LCSW   Amy Riley Kill, MSW, LCSWA [phone]

## 2012-01-24 NOTE — Progress Notes (Signed)
Name: Kaitlin Henderson MRN: [id number] DOB: Mar 31, 1941    LOS: 7  History of Present Illness:  71 year old woman with history of oxygen-dependent COPD Gold D   Subj:  Much improved, on 4L [place] sats 89% Cultures: BC X 2 5/17>>>neg Sputum  5/18: rare gpc pairs and rare gpr>>>nl flore   Antibiotics: levaquin (cap)5/17>>>  Tests / Events: Echo 5/18: Left ventricle: The cavity size was normal. Wall thickness was normal. Systolic function was normal. The estimated  ejection fraction was in the range of 60% to 65%. Wall motion was normal; there were no regional wall motion abnormalities. Doppler parameters are consistent with abnormal left ventricular relaxation (grade 1 diastolic dysfunction).   Past Medical History  Diagnosis Date  . Breast cancer, right breast 1998    s/p chemo & XRT, right mastectomy  . OBESITY   . CORONARY ARTERY DISEASE   . APHTHOUS STOMATITIS   . DEPRESSION     started sertraline 09/2010  . DIABETES, TYPE 2 dx 03/2010  . HYPERLIPIDEMIA   . Hypothyroidism   . C O P D     chronic O2 3LPM [place]  . Essential tremor    Past Surgical History  Procedure Date  . Appendectomy   . Cholecystectomy   . Abdominal hysterectomy   . Right mastectomy   . Tonsillectomy    Prior to Admission medications   Medication Sig Start Date End Date Taking? Authorizing Provider  albuterol (PROAIR HFA) 108 (90 BASE) MCG/ACT inhaler Inhale 2 puffs into the lungs every 6 (six) hours as needed for wheezing. 12/04/11 12/03/12 Yes Storm Frisk, MD  albuterol (PROVENTIL) (2.5 MG/3ML) 0.083% nebulizer solution Take 2.5 mg by nebulization 4 (four) times daily as needed. For shortness of breath   Yes Historical Provider, MD  ALPRAZolam (XANAX) 0.5 MG tablet Take 1 tablet (0.5 mg total) by mouth 2 (two) times daily. 09/13/11  Yes Newt Lukes, MD  Ascorbic Acid (VITAMIN C) 500 MG tablet Take 500 mg by mouth daily.     Yes Historical Provider, MD  aspirin 81 MG tablet Take 81 mg  by mouth daily.     Yes Historical Provider, MD  benzonatate (TESSALON) 100 MG capsule Take 100 mg by mouth every 8 (eight) hours as needed. For cough   Yes Historical Provider, MD  budesonide-formoterol (SYMBICORT) 160-4.5 MCG/ACT inhaler Inhale 2 puffs into the lungs 2 (two) times daily. 10/07/11 10/06/12 Yes Storm Frisk, MD  esomeprazole (NEXIUM) 40 MG capsule Take 40 mg by mouth daily before breakfast.   Yes Historical Provider, MD  furosemide (LASIX) 40 MG tablet Take 40 mg by mouth daily.   Yes Historical Provider, MD  glimepiride (AMARYL) 2 MG tablet Take 1 mg by mouth 2 (two) times daily. Take 1/2 by mouth every morning if cbg > 150   Yes Historical Provider, MD  Glucose Blood (ONETOUCH ULTRA BLUE VI) by In Vitro route. Use two times a day to check blood sugars    Yes Historical Provider, MD  metFORMIN (GLUCOPHAGE-XR) 500 MG 24 hr tablet Take 1 tablet (500 mg total) by mouth 2 (two) times daily. 11/25/11  Yes Newt Lukes, MD  Multiple Vitamins-Minerals (CENTRUM SILVER) tablet Take 1 tablet by mouth daily.     Yes Historical Provider, MD  Omega-3 Fatty Acids (FISH OIL) 1200 MG CAPS Take by mouth 2 (two) times daily.     Yes Historical Provider, MD  ONE TOUCH LANCETS MISC by Does  not apply route. Use two times a day to check blood sugars     Yes Historical Provider, MD  ONE TOUCH ULTRA TEST test strip CHECK BLOOD SUGAR TWO TIMES A DAY DX: 250.00 06/04/11  Yes Newt Lukes, MD  potassium chloride SA (KLOR-CON M20) 20 MEQ tablet Take 1 tablet (20 mEq total) by mouth 2 (two) times daily. 01/07/12 01/07/13 Yes Storm Frisk, MD  pravastatin (PRAVACHOL) 40 MG tablet Take 40 mg by mouth daily.   Yes Historical Provider, MD  predniSONE (DELTASONE) 10 MG tablet Take 10 mg by mouth daily.   Yes Historical Provider, MD  sennosides-docusate sodium (SENOKOT-S) 8.6-50 MG tablet Take 1 tablet by mouth at bedtime as needed. For constipation   Yes Historical Provider, MD  sertraline (ZOLOFT) 50 MG  tablet Take 1 tablet (50 mg total) by mouth daily. 11/25/11  Yes Newt Lukes, MD  tiotropium (SPIRIVA) 18 MCG inhalation capsule Place 1 capsule (18 mcg total) into inhaler and inhale daily. 10/07/11 10/06/12 Yes Storm Frisk, MD  vitamin E 400 UNIT capsule Take 400 Units by mouth 2 (two) times daily.    Yes Historical Provider, MD    Allergies Allergies  Allergen Reactions  . Codeine     REACTION: makes pt pass out  . Levaquin (Levofloxacin In D5w) Other (See Comments)    redness  . Sulfonamide Derivatives     REACTION: edema    Family History Family History  Problem Relation Age of Onset  . Diabetes Neg Hx   . Cancer Neg Hx     Social History  reports that she quit smoking about 5 years ago. Her smoking use included Cigarettes. She has a 135 pack-year smoking history. She has never used smokeless tobacco. She reports that she does not drink alcohol or use illicit drugs.   Vital Signs: BP 122/83  Pulse 88  Temp(Src) 97.6 F (36.4 C) (Oral)  Resp 20  Ht 5\' 2"  (1.575 m)  Wt 82 kg (180 lb 12.4 oz)  BMI 33.06 kg/m2  SpO2 89%      Intake/Output Summary (Last 24 hours) at 01/24/12 9147 Last data filed at 01/24/12 0725  Gross per 24 hour  Intake    806 ml  Output   3400 ml  Net  -2594 ml    Physical Examination: General: 71 year old female in no acute distress.  Neuro:  Awake, oriented X 3, no focal def HEENT:  [place], no JVD Cardiovascular:rrr Lungs: less wheeze distant BS Abdomen:non-tender, + bowel sounds Musculoskeletal:intacts Skin:scattered area of ecchymosis   Labs and Imaging:   Lab 01/24/12 0500 01/18/12 0405 01/17/12 2152 01/17/12 1322  NA -- 141 -- 139  K -- 4.0 -- 4.8  CL -- 98 -- 96  CO2 -- 25 -- 20  BUN -- 26* -- 19  CREATININE 1.00 0.99 0.92 --  GLUCOSE -- 199* -- 208*    Lab 01/24/12 0500 01/21/12 0545 01/18/12 0405  HGB 16.5* 14.6 15.1*  HCT 49.1* 44.2 44.0  WBC 16.2* 11.9* 10.3  PLT 307 274 252  Dg Chest 2 View  01/23/2012   *RADIOLOGY REPORT*  Clinical Data: Shortness of breath, cough.  CHEST - 2 VIEW  Comparison: 01/17/2012  Findings: Right base atelectasis.  Heart is upper limits normal in size.  No focal opacity on the left.  Mild biapical scarring, right greater than left, stable.  No effusions or acute bony abnormality.  IMPRESSION: Right base atelectasis.  Stable biapical scarring.  Original Report Authenticated By: Cyndie Chime, M.D.      ASSESSMENT AND PLAN  Principal Problem:  *Acute respiratory failure Active Problems:  DIABETES, TYPE 2  C O P D  Community acquired pneumonia  COPD exacerbation  NSTEMI (non-ST elevated myocardial infarction)  Conjunctivitis of left eye   Acute on Chronic Respiratory Failure in the setting of CAP, c/b NSTEMI super imposed on underlying Gold stage IV COPD Cap better     ON acceptable O2 needs for snf tfr.  Plan: Reduce lasix Cont abx Taper steroids Cont oxygen Ok for d/c to SNF       [name]  Beeper  [phone]  Cell  [phone]  If no response or cell goes to voicemail, call beeper [phone]   01/24/2012, 8:23 AM

## 2012-01-24 NOTE — Progress Notes (Signed)
Patient ID: EDLA PARA  female  ZOX:[id number]    DOB: May 14, 1941    DOA: 01/17/2012  PCP: [name], MD, MD  Subjective: Improving on 4 L nasal cannula  Objective: Weight change: -1.5 kg (-3 lb 4.9 oz)  Intake/Output Summary (Last 24 hours) at 01/24/12 1140 Last data filed at 01/24/12 1100  Gross per 24 hour  Intake    903 ml  Output   3500 ml  Net  -2597 ml   Blood pressure 122/83, pulse 88, temperature 97.6 F (36.4 C), temperature source Oral, resp. rate 20, height 5\' 2"  (1.575 m), weight 82 kg (180 lb 12.4 oz), SpO2 92.00%.  Physical Exam: General: Alert and awake, oriented x3, not in any acute distress. HEENT: anicteric sclera, pupils reactive to light and accommodation, EOMI CVS: S1-S2 clear, no murmur rubs or gallops Chest: Decreased breath sounds throughout bilaterally, somewhat shallow Abdomen: soft nontender, nondistended, normal bowel sounds, no organomegaly Extremities: no cyanosis, clubbing or edema noted bilaterally   Lab Results: Basic Metabolic Panel:  Lab 01/24/12 8119 01/18/12 0405 01/17/12 1322  NA -- 141 139  K -- 4.0 4.8  CL -- 98 96  CO2 -- 25 20  GLUCOSE -- 199* 208*  BUN -- 26* 19  CREATININE 1.00 0.99 --  CALCIUM -- 9.9 10.0  MG -- -- --  PHOS -- -- --   CBC:  Lab 01/24/12 0500 01/21/12 0545 01/17/12 1322  WBC 16.2* 11.9* --  NEUTROABS -- -- 8.5*  HGB 16.5* 14.6 --  HCT 49.1* 44.2 --  MCV 99.2 99.3 --  PLT 307 274 --   Cardiac Enzymes:  Lab 01/19/12 0452 01/18/12 0827 01/17/12 2345  CKTOTAL 198* 210* 229*  CKMB 12.6* 14.9* 13.1*  CKMBINDEX -- -- --  TROPONINI 1.86* 2.48* 2.05*   BNP: No components found with this basename: POCBNP:2 CBG:  Lab 01/24/12 1100 01/24/12 0609 01/23/12 2047 01/23/12 1628 01/23/12 1119  GLUCAP 235* 142* 156* 172* 246*     Micro Results: Recent Results (from the past 240 hour(s))  CULTURE, BLOOD (ROUTINE X 2)     Status: Normal   Collection Time   01/17/12  5:00 PM      Component  Value Range Status Comment   Specimen Description BLOOD ARM LEFT   Final    Special Requests BOTTLES DRAWN AEROBIC AND ANAEROBIC 10CC   Final    Culture  Setup Time [id number]   Final    Culture NO GROWTH 5 DAYS   Final    Report Status 01/24/2012 FINAL   Final   CULTURE, BLOOD (ROUTINE X 2)     Status: Normal   Collection Time   01/17/12  5:18 PM      Component Value Range Status Comment   Specimen Description BLOOD ARM RIGHT   Final    Special Requests BOTTLES DRAWN AEROBIC ONLY 10CC   Final    Culture  Setup Time [id number]   Final    Culture NO GROWTH 5 DAYS   Final    Report Status 01/24/2012 FINAL   Final   MRSA PCR SCREENING     Status: Normal   Collection Time   01/18/12  3:43 AM      Component Value Range Status Comment   MRSA by PCR NEGATIVE  NEGATIVE  Final   CULTURE, EXPECTORATED SPUTUM-ASSESSMENT     Status: Normal   Collection Time   01/18/12 11:00 AM      Component Value Range Status Comment  Specimen Description SPUTUM   Final    Special Requests NONE   Final    Sputum evaluation     Final    Value: THIS SPECIMEN IS ACCEPTABLE. RESPIRATORY CULTURE REPORT TO FOLLOW.   Report Status 01/18/2012 FINAL   Final   CULTURE, RESPIRATORY     Status: Normal   Collection Time   01/18/12 11:00 AM      Component Value Range Status Comment   Specimen Description SPUTUM   Final    Special Requests NONE   Final    Gram Stain     Final    Value: MODERATE WBC PRESENT, PREDOMINANTLY PMN     RARE SQUAMOUS EPITHELIAL CELLS PRESENT     RARE GRAM POSITIVE COCCI IN PAIRS     RARE GRAM POSITIVE RODS   Culture NORMAL OROPHARYNGEAL FLORA   Final    Report Status 01/20/2012 FINAL   Final     Studies/Results: Dg Chest Portable 1 View  01/17/2012  *RADIOLOGY REPORT*  Clinical Data:  Shortness of breath, history CHF, COPD, smoking  PORTABLE CHEST - 1 VIEW  Comparison: Portable exam 1330 hours compared to 08/11/2007  Findings: Rotated to the right. Normal heart size and mediastinal  contours for rotation. Slight increase in pulmonary vascular interstitial markings versus previous study, cannot exclude minimal edema or infection. No segmental consolidation, pleural effusion or pneumothorax. Pleural thickening or scarring at right apex appears stable. Bones appear diffusely demineralized. Surgical clips right axilla.  IMPRESSION: Increased interstitial markings versus previous exam, question minimal edema or infection.  Original Report Authenticated By: Lollie Marrow, M.D.    Medications: Scheduled Meds:    . ipratropium  0.5 mg Nebulization Q6H   And  . albuterol  2.5 mg Nebulization Q6H  . ALPRAZolam  0.5 mg Oral TID  . antiseptic oral rinse  15 mL Mouth Rinse q12n4p  . aspirin EC  81 mg Oral Daily  . bacitracin-polymyxin b  1 application Left Eye Q4H  . chlorhexidine  15 mL Mouth/Throat BID  . enoxaparin (LOVENOX) injection  40 mg Subcutaneous Q24H  . furosemide  40 mg Oral Daily  . glimepiride  1 mg Oral BID WC  . guaiFENesin  1,200 mg Oral BID  . insulin aspart  0-15 Units Subcutaneous TID WC  . levofloxacin  750 mg Oral Q24H  .  morphine injection  1 mg Intravenous Once  . pantoprazole  40 mg Oral Daily  . potassium chloride SA  20 mEq Oral BID  . predniSONE  50 mg Oral Q breakfast  . sertraline  50 mg Oral Daily  . simvastatin  40 mg Oral q1800  . sodium chloride  3 mL Intravenous Q12H  . DISCONTD: furosemide  40 mg Intravenous Q12H  . DISCONTD: predniSONE  30 mg Oral BID WC   Continuous Infusions:    Assessment/Plan:  1. 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 - appreciate pulmonology recommendations by Dr. Delford Field  2. Community acquired pneumonia:  Continue levofloxacin and #1.  3. COPD exacerbation/oxygen-dependent COPD:  continue Pulmicort, prednisone,  antibiotics, nebulizer treatments, supplemental oxygen.  4. Non-ST elevation MI: Asymptomatic. Most likely reflects strain rather than acute coronary syndrome/primary event. Completed 48 hours of therapeutic Lovenox. Continue aspirin. No beta blocker secondary to COPD. 2-D echocardiogram showed EF of 60-65% with no regional  wall motion abnormalities. Conservative management. Not an interventional candidate.  5. Elevated BNP: 2-D echocardiogram revealed diastolic dysfunction, on Lasix  6. Conjunctivitis left eye: Stable. Continue empiric ophthalmic drops.  7. Diabetes mellitus type 2:  Continue sliding scale insulin and Amaryl. Hold metformin while hospitalized.  Goals of care: Goals of care done by palliative medicine yesterday. Now patient is agreeing for SNF, discussed with social worker today to start the process and bed availability. Patient wishing for discharge to [facility]. DNR, DNI .    Prophylaxis: Lovenox  Code Status: DO NOT RESUSCITATE  Disposition: SNF when bed available.    LOS: 7 days   Aravind Chrismer M.D. [facility] 01/24/2012, 11:40 AM Pager: [phone]

## 2012-01-24 NOTE — Progress Notes (Signed)
Physical Therapy Treatment Patient Details Name: Kaitlin Henderson MRN: [id number] DOB: [birth year] Today's Date: 01/24/2012 Time: 1040-1110 PT Time Calculation (min): 30 min  PT Assessment / Plan / Recommendation Comments on Treatment Session  Ms. Netherland continues to present with low O2 saturation at rest (83 on 4L O2).  Pt requires max effort to perform simple task of supine-sit transition and sit-stand transfer.  Pt fatigues quickly and presents with gait abnormality that places her at "significant" falls risk.  Pt is unsafe for return to living alone due to her current functional limitations.  Pt would benefit from continued PT in SNF to address functional mobility, cardio-pulmonary endurance, generalized weakness and balance deficits.  Acute PT will continue to follow pt.      Follow Up Recommendations  Skilled nursing facility;Supervision/Assistance - 24 hour    Barriers to Discharge        Equipment Recommendations  Defer to next venue    Recommendations for Other Services    Frequency Min 3X/week   Plan Discharge plan needs to be updated;Frequency remains appropriate    Precautions / Restrictions Precautions Precautions: Fall Precaution Comments: O2 dependent Restrictions Weight Bearing Restrictions: No   Pertinent Vitals/Pain Pt denied pain.     Mobility  Bed Mobility Bed Mobility: Supine to Sit Supine to Sit: 5: Supervision;HOB elevated;With rails Sit to Supine: Not Tested (comment) Scooting to [facility]: Not tested (comment) Details for Bed Mobility Assistance: Pt pulling on rail of bed with extensive effort to come to sitting.  HR increased from 86 at rest to 119 during transition to sitting.  Transfers Transfers: Sit to Stand;Stand to Sit Sit to Stand: 4: Min assist;With upper extremity assist;From bed;3: Mod assist (two trials from EOB 1 bed low(mod A); 2 bed high(min a) ) Stand to Sit: 4: Min guard;With upper extremity assist;To chair/3-in-1 Details for Transfer  Assistance: Verbal cues for safe hand placement, tactile cues for anterior wt shift to initiate standing and controlled descent when sitting.  Pt stood from bed in elevated position.  Pt was unable to stand from bed in lower position similar to her home without moderate assist.  Ambulation/Gait Ambulation/Gait Assistance: 4: Min assist Ambulation Distance (Feet): 25 Feet Assistive device: Rolling walker Ambulation/Gait Assistance Details: Pt gait is unsteady and excessively slow. LOB x 1 in first 10 feet of ambulating required assist to correct.  Pt presents with shuffle gait.  O2 sats on 4L O2 via [place] ranged from 83 to 88.  HR as high as 134 and RR as high as 28.   Gait Pattern: Step-to pattern;Decreased stride length;Decreased hip/knee flexion - right;Decreased hip/knee flexion - left;Shuffle;Narrow base of support;Trunk flexed Gait velocity: 0.74ft/sec which places the pt at a significant falls risk.   Stairs: No Wheelchair Mobility Wheelchair Mobility: No    Exercises     PT Diagnosis:    PT Problem List:   PT Treatment Interventions:     PT Goals Acute Rehab PT Goals PT Goal Formulation: With patient Time For Goal Achievement: 01/27/12 Potential to Achieve Goals: Fair Pt will go Supine/Side to Sit: with modified independence PT Goal: Supine/Side to Sit - Progress: Progressing toward goal Pt will go Sit to Supine/Side: with modified independence;with HOB 0 degrees PT Goal: Sit to Supine/Side - Progress: Progressing toward goal Pt will go Sit to Stand: with modified independence PT Goal: Sit to Stand - Progress: Progressing toward goal Pt will go Stand to Sit: with modified independence PT Goal: Stand to Sit - Progress: Progressing  toward goal Pt will Transfer Bed to Chair/Chair to Bed: with modified independence PT Transfer Goal: Bed to Chair/Chair to Bed - Progress: Progressing toward goal Pt will Ambulate: 16 - 50 feet;with modified independence;with least restrictive assistive  device PT Goal: Ambulate - Progress: Progressing toward goal Pt will Go Up / Down Stairs: 3-5 stairs;with supervision;with least restrictive assistive device;with rail(s) PT Goal: Up/Down Stairs - Progress: Not met Additional Goals Additional Goal #1: Pt will independently recongize her need for a rest break.  PT Goal: Additional Goal #1 - Progress: Not met  Visit Information  Last PT Received On: 01/24/12    Subjective Data  Subjective: I feel weak I cant go very far.  Patient Stated Goal: Be able to take care of myself   Cognition  Overall Cognitive Status: Appears within functional limits for tasks assessed/performed Arousal/Alertness: Awake/alert Orientation Level: Appears intact for tasks assessed Behavior During Session: [facility] for tasks performed Cognition - Other Comments: Pt continues to be concerned about ability to take go home alone.      Balance  Balance Balance Assessed: Yes Static Sitting Balance Static Sitting - Balance Support: No upper extremity supported;Feet supported Static Sitting - Level of Assistance: 5: Stand by assistance Static Sitting - Comment/# of Minutes: pt sat on EOB 3+ minutes with supervision for c/o dizziness.  Dizziness resolved after approximately 1 minute.  Static Standing Balance Static Standing - Balance Support: No upper extremity supported Static Standing - Level of Assistance: 4: Min assist Static Standing - Comment/# of Minutes: <30 seconds static standing with increased A-P sway and LOB posteriorly requiring UE support to correct.    End of Session PT - End of Session Equipment Utilized During Treatment: Gait belt Activity Tolerance: Patient limited by fatigue;Treatment limited secondary to medical complications (Comment);Other (comment) (O2 sats in Low 80s on 4-6 L O2 via [place].  ) Patient left: in chair;with call bell/phone within reach Nurse Communication: Mobility status;Other (comment) (pulmonary response to activity.)     Bronte Sabado 01/24/2012, 12:46 PM Ikram Riebe L. [place] DPT [phone]

## 2012-01-24 NOTE — Progress Notes (Signed)
Clinical Social Work Department BRIEF PSYCHOSOCIAL ASSESSMENT 01/24/2012  Patient:  Kaitlin Henderson, Kaitlin Henderson     Account Number:  [id number]     Admit date:  01/17/2012  Clinical Social Worker:  Juliette Mangle  Date/Time:  01/24/2012 01:30 PM  Referred by:  Physician  Date Referred:  01/23/2012 Referred for  SNF Placement   Other Referral:   Interview type:  Patient Other interview type:    PSYCHOSOCIAL DATA Living Status:  ALONE Admitted from facility:   Level of care:   Primary support name:  none reported Primary support relationship to patient:  NONE Degree of support available:   extremely poor    CURRENT CONCERNS Current Concerns  Post-Acute Placement  Other - See comment   Other Concerns:   L:ack of support    SOCIAL WORK ASSESSMENT / PLAN CSW received a referral for SNF placement for patient. Both PT and OT recommended HH with supervision and until yesterday patient had been refusing to go to a SNF. This AM, the MD reported that patient needed SNF care to become more independent and patient was now agreeable.  PT also worked with the patient and recommended SNF level of care for the  patient. CSW and CM met with patient to discuss placement.  Patient reported that she wants to go to [facility]. However, CSW contacted [facility] and they are not agreeable at this time to accepting the patient. CSW spoke to the patient about this and provided patient with a choice list to look over. CSW re-enforced that patient would need to choose several other facilities besides [facility].  CSW will continue to follow and will have Weekend CSW provide patient with choice list.   Assessment/plan status:  Psychosocial Support/Ongoing Assessment of Needs Other assessment/ plan:   Information/referral to community resources:    PATIENT'S/FAMILY'S RESPONSE TO PLAN OF CARE: Patient was obstinate and was not interested in looking at the SNF choice list. Patient did express some gratitude. CSW will  continue to follow and assist with d/c. D/C is scheduled for Monday per Dr. Isidoro Donning. Patient will either go to SNF, go home with home health, or home with Hospice.   Sabino Niemann, MSW, [employer] [phone]

## 2012-01-24 NOTE — Progress Notes (Signed)
Palliative Medicine Team SW  Discussed case with PMT NP and CSW Amy Stuckey. Will follow from afar as most of pt's concerns are disposition related at this time. Available as needed.   Kennieth Francois, [place] Pager [phone]

## 2012-01-25 DIAGNOSIS — I214 Non-ST elevation (NSTEMI) myocardial infarction: Secondary | ICD-10-CM

## 2012-01-25 DIAGNOSIS — D696 Thrombocytopenia, unspecified: Secondary | ICD-10-CM

## 2012-01-25 DIAGNOSIS — J438 Other emphysema: Secondary | ICD-10-CM

## 2012-01-25 DIAGNOSIS — J9600 Acute respiratory failure, unspecified whether with hypoxia or hypercapnia: Secondary | ICD-10-CM

## 2012-01-25 LAB — GLUCOSE, CAPILLARY
Glucose-Capillary: 120 mg/dL — ABNORMAL HIGH (ref 70–99)
Glucose-Capillary: 261 mg/dL — ABNORMAL HIGH (ref 70–99)
Glucose-Capillary: 257 mg/dL — ABNORMAL HIGH (ref 70–99)

## 2012-01-25 MED ORDER — FUROSEMIDE 40 MG PO TABS
40.0000 mg | ORAL_TABLET | Freq: Two times a day (BID) | ORAL | Status: DC
  Administered 2012-01-25 – 2012-01-28 (×6): 40 mg via ORAL
  Filled 2012-01-25 (×8): qty 1

## 2012-01-25 NOTE — Progress Notes (Signed)
Patient ID: Kaitlin Henderson  female  XBJ:[id number]    DOB: [birth year]    DOA: 01/17/2012  PCP: [name], MD, MD  Subjective: Sats in 90s on 4 L nasal cannula, patient states that she was more improving on BID Lasix  Objective: Weight change: -1.169 kg (-2 lb 9.2 oz)  Intake/Output Summary (Last 24 hours) at 01/25/12 1108 Last data filed at 01/25/12 0948  Gross per 24 hour  Intake    603 ml  Output   1075 ml  Net   -472 ml   Blood pressure 123/70, pulse 94, temperature 97.3 F (36.3 C), temperature source Oral, resp. rate 20, height 5\' 2"  (1.575 m), weight 80.831 kg (178 lb 3.2 oz), SpO2 94.00%.  Physical Exam: General: Alert and awake, oriented x3, not in any acute distress. HEENT: anicteric sclera, pupils reactive to light and accommodation, EOMI CVS: S1-S2 clear, no murmur rubs or gallops Chest: Decreased breath sounds throughout bilaterally, somewhat shallow Abdomen: soft nontender, nondistended, normal bowel sounds, no organomegaly Extremities: no cyanosis, clubbing or edema noted bilaterally   Lab Results: Basic Metabolic Panel:  Lab 01/24/12 3086  NA --  K --  CL --  CO2 --  GLUCOSE --  BUN --  CREATININE 1.00  CALCIUM --  MG --  PHOS --   CBC:  Lab 01/24/12 0500 01/21/12 0545  WBC 16.2* 11.9*  NEUTROABS -- --  HGB 16.5* 14.6  HCT 49.1* 44.2  MCV 99.2 99.3  PLT 307 274   Cardiac Enzymes:  Lab 01/19/12 0452  CKTOTAL 198*  CKMB 12.6*  CKMBINDEX --  TROPONINI 1.86*   BNP: No components found with this basename: POCBNP:2 CBG:  Lab 01/25/12 0637 01/24/12 2112 01/24/12 1611 01/24/12 1100 01/24/12 0609  GLUCAP 120* 129* 237* 235* 142*     Micro Results: Recent Results (from the past 240 hour(s))  CULTURE, BLOOD (ROUTINE X 2)     Status: Normal   Collection Time   01/17/12  5:00 PM      Component Value Range Status Comment   Specimen Description BLOOD ARM LEFT   Final    Special Requests BOTTLES DRAWN AEROBIC AND ANAEROBIC 10CC    Final    Culture  Setup Time [id number]   Final    Culture NO GROWTH 5 DAYS   Final    Report Status 01/24/2012 FINAL   Final   CULTURE, BLOOD (ROUTINE X 2)     Status: Normal   Collection Time   01/17/12  5:18 PM      Component Value Range Status Comment   Specimen Description BLOOD ARM RIGHT   Final    Special Requests BOTTLES DRAWN AEROBIC ONLY 10CC   Final    Culture  Setup Time [id number]   Final    Culture NO GROWTH 5 DAYS   Final    Report Status 01/24/2012 FINAL   Final   MRSA PCR SCREENING     Status: Normal   Collection Time   01/18/12  3:43 AM      Component Value Range Status Comment   MRSA by PCR NEGATIVE  NEGATIVE  Final   CULTURE, EXPECTORATED SPUTUM-ASSESSMENT     Status: Normal   Collection Time   01/18/12 11:00 AM      Component Value Range Status Comment   Specimen Description SPUTUM   Final    Special Requests NONE   Final    Sputum evaluation     Final    Value: THIS  SPECIMEN IS ACCEPTABLE. RESPIRATORY CULTURE REPORT TO FOLLOW.   Report Status 01/18/2012 FINAL   Final   CULTURE, RESPIRATORY     Status: Normal   Collection Time   01/18/12 11:00 AM      Component Value Range Status Comment   Specimen Description SPUTUM   Final    Special Requests NONE   Final    Gram Stain     Final    Value: MODERATE WBC PRESENT, PREDOMINANTLY PMN     RARE SQUAMOUS EPITHELIAL CELLS PRESENT     RARE GRAM POSITIVE COCCI IN PAIRS     RARE GRAM POSITIVE RODS   Culture NORMAL OROPHARYNGEAL FLORA   Final    Report Status 01/20/2012 FINAL   Final     Studies/Results: Dg Chest Portable 1 View  01/17/2012  *RADIOLOGY REPORT*  Clinical Data:  Shortness of breath, history CHF, COPD, smoking  PORTABLE CHEST - 1 VIEW  Comparison: Portable exam 1330 hours compared to 08/11/2007  Findings: Rotated to the right. Normal heart size and mediastinal contours for rotation. Slight increase in pulmonary vascular interstitial markings versus previous study, cannot exclude minimal edema or  infection. No segmental consolidation, pleural effusion or pneumothorax. Pleural thickening or scarring at right apex appears stable. Bones appear diffusely demineralized. Surgical clips right axilla.  IMPRESSION: Increased interstitial markings versus previous exam, question minimal edema or infection.  Original Report Authenticated By: Lollie Marrow, M.D.    Medications: Scheduled Meds:    . ipratropium  0.5 mg Nebulization Q6H   And  . albuterol  2.5 mg Nebulization Q6H  . ALPRAZolam  0.5 mg Oral TID  . antiseptic oral rinse  15 mL Mouth Rinse q12n4p  . aspirin EC  81 mg Oral Daily  . bacitracin-polymyxin b  1 application Left Eye Q4H  . chlorhexidine  15 mL Mouth/Throat BID  . enoxaparin (LOVENOX) injection  40 mg Subcutaneous Q24H  . furosemide  40 mg Oral BID  . glimepiride  1 mg Oral BID WC  . guaiFENesin  1,200 mg Oral BID  . insulin aspart  0-15 Units Subcutaneous TID WC  . levofloxacin  750 mg Oral Q24H  .  morphine injection  1 mg Intravenous Once  . pantoprazole  40 mg Oral Daily  . potassium chloride SA  20 mEq Oral BID  . predniSONE  50 mg Oral Q breakfast  . sertraline  50 mg Oral Daily  . simvastatin  40 mg Oral q1800  . sodium chloride  3 mL Intravenous Q12H  . DISCONTD: furosemide  40 mg Oral Daily   Continuous Infusions:    Assessment/Plan:  1. 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 - appreciate pulmonology recommendations by Dr. Delford Field, felt she was acceptable for SNF transfer however no facility was available.   2. Community acquired pneumonia:  Continue levofloxacin and #1.  3. COPD exacerbation/oxygen-dependent COPD:  continue Pulmicort, prednisone, antibiotics, nebulizer treatments, supplemental oxygen.  4. Non-ST elevation MI:  Asymptomatic. Most likely reflects strain rather than acute coronary syndrome/primary event. Completed 48 hours of therapeutic Lovenox. Continue aspirin. No beta blocker secondary to COPD. 2-D echocardiogram showed EF of 60-65% with no regional wall motion abnormalities. Conservative management. Not an interventional candidate.  5. Elevated BNP: 2-D echocardiogram revealed diastolic dysfunction, on Lasix  6.  Conjunctivitis left eye: Stable completed the course of Polysporin antibiotics.   7. Diabetes mellitus type 2:  Continue sliding scale insulin and Amaryl. Hold metformin while hospitalized.  Goals of care: Goals of care done by palliative medicine, DNR/DNI. Difficult disposition as patient was not agreeing to SNF until 01/24/2012 and then subsequently requested for [facility] snf however she was declined by [facility]. Updated the patient by the social worker to choose several other facilities, will follow on 01/27/2012 if accepted by different facility for DC. Otherwise patient will need 24-hour supervision, patient does not allow me to talk to her daughter.     Prophylaxis: Lovenox  Code Status: DO NOT RESUSCITATE  Disposition: SNF when bed available, hopefully Monday.    LOS: 8 days   Justin Meisenheimer M.D. [facility] 01/25/2012, 11:08 AM Pager: [phone]

## 2012-01-25 NOTE — Progress Notes (Signed)
OT Note:  Pt fatiqued this am.  NT had just helped her to bathroom and assisted with bathing.  Will check back another time.  [place], Payne [phone] 01/25/2012

## 2012-01-26 DIAGNOSIS — J438 Other emphysema: Secondary | ICD-10-CM

## 2012-01-26 DIAGNOSIS — J9600 Acute respiratory failure, unspecified whether with hypoxia or hypercapnia: Secondary | ICD-10-CM

## 2012-01-26 DIAGNOSIS — D696 Thrombocytopenia, unspecified: Secondary | ICD-10-CM

## 2012-01-26 DIAGNOSIS — I214 Non-ST elevation (NSTEMI) myocardial infarction: Secondary | ICD-10-CM

## 2012-01-26 LAB — GLUCOSE, CAPILLARY
Glucose-Capillary: 207 mg/dL — ABNORMAL HIGH (ref 70–99)
Glucose-Capillary: 187 mg/dL — ABNORMAL HIGH (ref 70–99)

## 2012-01-26 NOTE — Progress Notes (Signed)
Patient ID: Kaitlin Henderson  female  [id number]:[id number]    DOB: 1940-10-20    DOA: 01/17/2012  PCP: [name], MD, MD  Subjective: Sats in 90s on 4 L nasal cannula, resting with her eyes closed, easily arousable, states that she has not looked at the list from the social worker and states that "Dr. Delford Field will get her into [facility] SNF". No other acute issues overnight  Objective: Weight change: 0.369 kg (13 oz)  Intake/Output Summary (Last 24 hours) at 01/26/12 1032 Last data filed at 01/26/12 0845  Gross per 24 hour  Intake    880 ml  Output    950 ml  Net    -70 ml   Blood pressure 108/51, pulse 80, temperature 97.3 F (36.3 C), temperature source Oral, resp. rate 20, height 5\' 2"  (1.575 m), weight 81.2 kg (179 lb 0.2 oz), SpO2 93.00%.  Physical Exam: General: Alert and awake, oriented x3, not in any acute distress. HEENT: anicteric sclera, pupils reactive to light and accommodation, EOMI CVS: S1-S2 clear, no murmur rubs or gallops Chest: Decreased breath sounds throughout bilaterally, somewhat shallow Abdomen: soft nontender, nondistended, normal bowel sounds, no organomegaly Extremities: no cyanosis, clubbing or edema noted bilaterally   Lab Results: Basic Metabolic Panel:  Lab 01/24/12 6295  NA --  K --  CL --  CO2 --  GLUCOSE --  BUN --  CREATININE 1.00  CALCIUM --  MG --  PHOS --   CBC:  Lab 01/24/12 0500 01/21/12 0545  WBC 16.2* 11.9*  NEUTROABS -- --  HGB 16.5* 14.6  HCT 49.1* 44.2  MCV 99.2 99.3  PLT 307 274   Cardiac Enzymes: No results found for this basename: CKTOTAL:3,CKMB:3,CKMBINDEX:3,TROPONINI:3 in the last 168 hours BNP: No components found with this basename: POCBNP:2 CBG:  Lab 01/26/12 0605 01/25/12 2114 01/25/12 1604 01/25/12 1106 01/25/12 0637  GLUCAP 95 233* 257* 261* 120*     Micro Results: Recent Results (from the past 240 hour(s))  CULTURE, BLOOD (ROUTINE X 2)     Status: Normal   Collection Time   01/17/12  5:00 PM   Component Value Range Status Comment   Specimen Description BLOOD ARM LEFT   Final    Special Requests BOTTLES DRAWN AEROBIC AND ANAEROBIC 10CC   Final    Culture  Setup Time [id number]   Final    Culture NO GROWTH 5 DAYS   Final    Report Status 01/24/2012 FINAL   Final   CULTURE, BLOOD (ROUTINE X 2)     Status: Normal   Collection Time   01/17/12  5:18 PM      Component Value Range Status Comment   Specimen Description BLOOD ARM RIGHT   Final    Special Requests BOTTLES DRAWN AEROBIC ONLY 10CC   Final    Culture  Setup Time [id number]   Final    Culture NO GROWTH 5 DAYS   Final    Report Status 01/24/2012 FINAL   Final   MRSA PCR SCREENING     Status: Normal   Collection Time   01/18/12  3:43 AM      Component Value Range Status Comment   MRSA by PCR NEGATIVE  NEGATIVE  Final   CULTURE, EXPECTORATED SPUTUM-ASSESSMENT     Status: Normal   Collection Time   01/18/12 11:00 AM      Component Value Range Status Comment   Specimen Description SPUTUM   Final    Special Requests NONE  Final    Sputum evaluation     Final    Value: THIS SPECIMEN IS ACCEPTABLE. RESPIRATORY CULTURE REPORT TO FOLLOW.   Report Status 01/18/2012 FINAL   Final   CULTURE, RESPIRATORY     Status: Normal   Collection Time   01/18/12 11:00 AM      Component Value Range Status Comment   Specimen Description SPUTUM   Final    Special Requests NONE   Final    Gram Stain     Final    Value: MODERATE WBC PRESENT, PREDOMINANTLY PMN     RARE SQUAMOUS EPITHELIAL CELLS PRESENT     RARE GRAM POSITIVE COCCI IN PAIRS     RARE GRAM POSITIVE RODS   Culture NORMAL OROPHARYNGEAL FLORA   Final    Report Status 01/20/2012 FINAL   Final     Studies/Results: Dg Chest Portable 1 View  01/17/2012  *RADIOLOGY REPORT*  Clinical Data:  Shortness of breath, history CHF, COPD, smoking  PORTABLE CHEST - 1 VIEW  Comparison: Portable exam 1330 hours compared to 08/11/2007  Findings: Rotated to the right. Normal heart size and  mediastinal contours for rotation. Slight increase in pulmonary vascular interstitial markings versus previous study, cannot exclude minimal edema or infection. No segmental consolidation, pleural effusion or pneumothorax. Pleural thickening or scarring at right apex appears stable. Bones appear diffusely demineralized. Surgical clips right axilla.  IMPRESSION: Increased interstitial markings versus previous exam, question minimal edema or infection.  Original Report Authenticated By: Lollie Marrow, M.D.    Medications: Scheduled Meds:    . ipratropium  0.5 mg Nebulization Q6H   And  . albuterol  2.5 mg Nebulization Q6H  . ALPRAZolam  0.5 mg Oral TID  . antiseptic oral rinse  15 mL Mouth Rinse q12n4p  . aspirin EC  81 mg Oral Daily  . chlorhexidine  15 mL Mouth/Throat BID  . enoxaparin (LOVENOX) injection  40 mg Subcutaneous Q24H  . furosemide  40 mg Oral BID  . glimepiride  1 mg Oral BID WC  . guaiFENesin  1,200 mg Oral BID  . insulin aspart  0-15 Units Subcutaneous TID WC  . levofloxacin  750 mg Oral Q24H  .  morphine injection  1 mg Intravenous Once  . pantoprazole  40 mg Oral Daily  . potassium chloride SA  20 mEq Oral BID  . predniSONE  50 mg Oral Q breakfast  . sertraline  50 mg Oral Daily  . simvastatin  40 mg Oral q1800  . sodium chloride  3 mL Intravenous Q12H  . DISCONTD: furosemide  40 mg Oral Daily   Continuous Infusions:    Assessment/Plan:  1. 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 - appreciate pulmonology recommendations by Dr. Delford Field, felt she was acceptable for SNF transfer however no facility was available.   2. Community acquired pneumonia:  Continue levofloxacin and #1.  3. COPD exacerbation/oxygen-dependent COPD:  continue Pulmicort, prednisone, antibiotics,  nebulizer treatments, supplemental oxygen.  4. Non-ST elevation MI: Asymptomatic. Most likely reflects strain rather than acute coronary syndrome/primary event. Completed 48 hours of therapeutic Lovenox. Continue aspirin. No beta blocker secondary to COPD. 2-D echocardiogram showed EF of 60-65% with no regional wall motion abnormalities. Conservative management. Not an interventional candidate.  5. Elevated BNP: 2-D echocardiogram revealed diastolic dysfunction, on  Lasix  6. Conjunctivitis left eye: Stable completed the course of Polysporin antibiotics.   7. Diabetes mellitus type 2:  Continue sliding scale insulin and Amaryl. Hold metformin while hospitalized.  Goals of care: Goals of care done by palliative medicine, DNR/DNI. Difficult disposition as patient was not agreeing to SNF until 01/24/2012 and then subsequently requested for [facility] snf however she was declined by [facility]. Updated the patient by the social worker to choose several other facilities, however on today's encounter patient states that she has not looked at the list and states that Dr Delford Field will get her into [facility]. I explained to the patient that she needs to look at the list and give the social worker more choices for other SNF's. Patient will need 24-hour supervision and maximizing the resources, patient does not allow me to talk to her daughter. Discharging patient home alone is unsafe unless she has 24-hour supervision. Will also discuss with home health hospice as to how much assistance they can provide if she chooses to be DC'ed home.      Prophylaxis: Lovenox  Code Status: DO NOT RESUSCITATE  Disposition: Unclear until safe discharge option is available.     LOS: 9 days   Kennieth Plotts M.D. [facility] 01/26/2012, 10:32 AM Pager: [phone]

## 2012-01-26 NOTE — Progress Notes (Addendum)
Clinical social worker received call from pt RN, stating that patient was very anxious and concerned regarding pt discharge plan.   Per chart review, pt MD had stated that patient would hopefully discharge on Monday if medically stable and safe discharge plan was in place.   Per chart review, CSW colleague spoke with pt on 5/25 regarding bed offers. At this time, Patient bed offers include [address] [facility] and [facility] which pt does not feel are safe discharge plans and are unacceptable discharge plans for pt.   Pt is requesting snf placement for short term rehab only at [facility] or [facility].   CSW met with pt at bedside to provide emotional support and to discuss pt disposition and discharge plans. CSW spoke with pt in depth regarding available bed offers, and pt wishes. Pt states she understands that the facilities have to determine if they are able to meet pt needs as well as have bed availability. Pt states she also understands that CSW and Medical staff here at the hospital do not have any influence on whether a patient can go to the facility requested if facility is unable to meet needs or does not have space available.   CSW provided emotional support, active listening, as well as motivational interviewing in regards to pt handling emotions related to discharge and the unknown answers at this time.   Per motivational interviewing, pt was able to verbalize her wishes as well as realistically evaluate all of pt disposition options. These options included, discharging to [facility], [facility], a facility which she feels is safe in [place], or home with home health services.    Pt asked CSW to expand snf search to [place] to look for other facilities. CSW and Pt agreed for csw colleague to follow up with pt bed offers that are available as of tomorrow.   Pt also reiterated with CSW that pt does not wish for  pt children to be notified or informed of pt hospitalization or discharge plans.   CSW and Pt discussed that pt MD would have to agree to discharge plan in order for discharge plan to be deemed an appropriate and safe discharge plan.   Per discussion with MD, pt discharging home alone is not a safe discharge plan due to patient not up and walking and on 4L of oxygen. CSW to follow up with pt after all skilled nursing facility options have been reviewed with pt with expanded search and potential assistance at home.   .Clinical social worker continuing to follow pt to assist with pt dc plans and further csw needs.   Doree Albee Weekend Coverage [phone] 11:16am .01/26/2012

## 2012-01-27 DIAGNOSIS — J438 Other emphysema: Secondary | ICD-10-CM

## 2012-01-27 DIAGNOSIS — I214 Non-ST elevation (NSTEMI) myocardial infarction: Secondary | ICD-10-CM

## 2012-01-27 DIAGNOSIS — J9600 Acute respiratory failure, unspecified whether with hypoxia or hypercapnia: Secondary | ICD-10-CM

## 2012-01-27 DIAGNOSIS — D696 Thrombocytopenia, unspecified: Secondary | ICD-10-CM

## 2012-01-27 LAB — GLUCOSE, CAPILLARY
Glucose-Capillary: 211 mg/dL — ABNORMAL HIGH (ref 70–99)
Glucose-Capillary: 185 mg/dL — ABNORMAL HIGH (ref 70–99)
Glucose-Capillary: 224 mg/dL — ABNORMAL HIGH (ref 70–99)

## 2012-01-27 LAB — CBC
Hemoglobin: 14.9 g/dL (ref 12.0–15.0)
MCH: 33.1 pg (ref 26.0–34.0)
Platelets: 285 10*3/uL (ref 150–400)
RBC: 4.5 MIL/uL (ref 3.87–5.11)
WBC: 13.3 10*3/uL — ABNORMAL HIGH (ref 4.0–10.5)

## 2012-01-27 LAB — BASIC METABOLIC PANEL
BUN: 27 mg/dL — ABNORMAL HIGH (ref 6–23)
Calcium: 8.9 mg/dL (ref 8.4–10.5)
Chloride: 92 mEq/L — ABNORMAL LOW (ref 96–112)
Creatinine, Ser: 1.03 mg/dL (ref 0.50–1.10)
GFR calc Af Amer: 62 mL/min — ABNORMAL LOW (ref >90)
GFR calc non Af Amer: 53 mL/min — ABNORMAL LOW (ref >90)

## 2012-01-27 MED ORDER — IPRATROPIUM BROMIDE 0.02 % IN SOLN: 0.5000 mg | Freq: Four times a day (QID) | RESPIRATORY_TRACT | Status: DC

## 2012-01-27 MED ORDER — PREDNISONE 10 MG PO TABS: 10.0000 mg | ORAL_TABLET | Freq: Every day | ORAL | Status: DC

## 2012-01-27 MED ORDER — ALPRAZOLAM 0.5 MG PO TABS: 0.5000 mg | ORAL_TABLET | Freq: Two times a day (BID) | ORAL | Status: DC

## 2012-01-27 MED ORDER — ALBUTEROL SULFATE (5 MG/ML) 0.5% IN NEBU: 2.5000 mg | INHALATION_SOLUTION | Freq: Four times a day (QID) | RESPIRATORY_TRACT | Status: DC

## 2012-01-27 MED ORDER — BUDESONIDE 0.5 MG/2ML IN SUSP: 0.5000 mg | Freq: Two times a day (BID) | RESPIRATORY_TRACT | Status: DC

## 2012-01-27 MED ORDER — PREDNISONE 20 MG PO TABS
40.0000 mg | ORAL_TABLET | Freq: Every day | ORAL | Status: DC
  Administered 2012-01-28: 40 mg via ORAL
  Filled 2012-01-27 (×2): qty 2

## 2012-01-27 MED ORDER — LEVOFLOXACIN 750 MG PO TABS: 750.0000 mg | ORAL_TABLET | ORAL | Status: DC

## 2012-01-27 MED ORDER — FUROSEMIDE 40 MG PO TABS: 40.0000 mg | ORAL_TABLET | Freq: Two times a day (BID) | ORAL | Status: DC

## 2012-01-27 MED ORDER — GUAIFENESIN-DM 100-10 MG/5ML PO SYRP: 5.0000 mL | ORAL_SOLUTION | ORAL | Status: DC | PRN

## 2012-01-27 MED ORDER — PREDNISONE (PAK) 10 MG PO TABS: 10.0000 mg | ORAL_TABLET | Freq: Every day | ORAL | Status: DC

## 2012-01-27 MED ORDER — INSULIN GLARGINE 100 UNIT/ML ~~LOC~~ SOLN
5.0000 [IU] | Freq: Every day | SUBCUTANEOUS | Status: DC
  Administered 2012-01-27: 5 [IU] via SUBCUTANEOUS

## 2012-01-27 MED ORDER — PREDNISONE (PAK) 10 MG PO TABS: ORAL_TABLET | ORAL | Status: DC

## 2012-01-27 MED ORDER — GUAIFENESIN ER 600 MG PO TB12: 1200.0000 mg | ORAL_TABLET | Freq: Two times a day (BID) | ORAL | Status: DC

## 2012-01-27 NOTE — Progress Notes (Signed)
Physical Therapy Treatment Patient Details Name: Kaitlin Henderson MRN: [id number] DOB: 05/15/1941 Today's Date: 01/27/2012 Time: 4098-1191 PT Time Calculation (min): 20 min  PT Assessment / Plan / Recommendation Comments on Treatment Session  Pt now states that she plans to go home from hospital not wanting to go to SNF.      Follow Up Recommendations  Skilled nursing facility;Supervision/Assistance - 24 hour    Barriers to Discharge        Equipment Recommendations  Defer to next venue    Recommendations for Other Services    Frequency Min 3X/week   Plan Discharge plan remains appropriate;Frequency remains appropriate    Precautions / Restrictions Precautions Precautions: Fall Precaution Comments: O2 dependent Restrictions Weight Bearing Restrictions: No   Pertinent Vitals/Pain No c/o pain     Mobility  Bed Mobility Bed Mobility: Supine to Sit Supine to Sit: 6: Modified independent (Device/Increase time);HOB elevated Sit to Supine: 5: Supervision Scooting to [facility]: Not tested (comment) Details for Bed Mobility Assistance: No assistance required. Transfers Transfers: Sit to Stand;Stand to Sit Sit to Stand: 5: Supervision;From bed;With upper extremity assist Stand to Sit: 5: Supervision;To bed Details for Transfer Assistance: Continues to require cueing for safe technique.   Ambulation/Gait Ambulation/Gait Assistance: 5: Supervision Ambulation Distance (Feet): 250 Feet Assistive device: Rolling walker Ambulation/Gait Assistance Details: Pt O2 sats in mid 80s on 4L O2 via [place].  Cues to increase gait. speed.  Several standing rest breaks.  Gait Pattern: Step-to pattern;Decreased stride length;Decreased hip/knee flexion - right;Decreased hip/knee flexion - left;Shuffle;Narrow base of support;Trunk flexed Gait velocity: 17ft/26sec.= .68ft/sec.  High falls risk.   Stairs: No Wheelchair Mobility Wheelchair Mobility: No    Exercises     PT Diagnosis:    PT Problem List:    PT Treatment Interventions:     PT Goals Acute Rehab PT Goals PT Goal Formulation: With patient Time For Goal Achievement: 02/03/12 Potential to Achieve Goals: Fair Pt will go Supine/Side to Sit: with modified independence PT Goal: Supine/Side to Sit - Progress: Met Pt will go Sit to Supine/Side: with modified independence;with HOB 0 degrees PT Goal: Sit to Supine/Side - Progress: Met Pt will go Sit to Stand: with modified independence PT Goal: Sit to Stand - Progress: Progressing toward goal Pt will go Stand to Sit: with modified independence PT Goal: Stand to Sit - Progress: Progressing toward goal Pt will Transfer Bed to Chair/Chair to Bed: with modified independence PT Transfer Goal: Bed to Chair/Chair to Bed - Progress: Progressing toward goal Pt will Ambulate: with modified independence;with least restrictive assistive device;>150 feet PT Goal: Ambulate - Progress: Updated due to goal met Pt will Go Up / Down Stairs: 3-5 stairs;with supervision;with least restrictive assistive device;with rail(s) PT Goal: Up/Down Stairs - Progress: Not met Additional Goals Additional Goal #1: Pt will independently recongize her need for a rest break.  PT Goal: Additional Goal #1 - Progress: Met  Visit Information  Last PT Received On: 01/27/12    Subjective Data  Subjective: I feel better.  Patient Stated Goal: Be able to take care of myself   Cognition  Overall Cognitive Status: Appears within functional limits for tasks assessed/performed Arousal/Alertness: Awake/alert Orientation Level: Appears intact for tasks assessed Behavior During Session: [facility] for tasks performed    Balance  Balance Balance Assessed: No  End of Session PT - End of Session Equipment Utilized During Treatment: Gait belt Activity Tolerance: Patient tolerated treatment well Patient left: in bed;with call bell/phone within reach Nurse Communication:  Mobility status;Other (comment)    Stancil Deisher 01/27/2012,  3:10 PM Neliah Cuyler L. Jenisse Vullo DPT [phone]

## 2012-01-27 NOTE — Discharge Summary (Signed)
Physician Discharge Summary  Patient ID: Kaitlin Henderson MRN: [id number] DOB/AGE: 1940-11-08 71 y.o.  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 [name]                      Palliative medicine    Discharge Medications: Medication List  As of 01/28/2012 10:50 AM      STOP taking these medications         albuterol (2.5 MG/3ML) 0.083% nebulizer solution      albuterol 108 (90 BASE) MCG/ACT inhaler      budesonide-formoterol 160-4.5 MCG/ACT inhaler      NEXIUM 40 MG capsule      ONE TOUCH LANCETS Misc      ONETOUCH ULTRA BLUE VI      tiotropium 18 MCG inhalation capsule         TAKE these medications         albuterol (5 MG/ML) 0.5% nebulizer solution   Commonly known as: PROVENTIL   Take 0.5 mLs (2.5 mg total) by nebulization 4 (four) times daily.      ALPRAZolam 0.5 MG tablet   Commonly known as: XANAX   Take 1 tablet (0.5 mg total) by mouth 2 (two) times daily.      aspirin 81 MG tablet   Take 81 mg by mouth daily.      benzonatate 100 MG capsule   Commonly known as: TESSALON   Take 100 mg by mouth every 8 (eight) hours as needed. For cough      budesonide 0.5 MG/2ML nebulizer solution   Commonly known as: PULMICORT   Take 2 mLs (0.5 mg total) by nebulization 2 (two) times daily.      CENTRUM SILVER tablet   Take 1 tablet by mouth daily.      esomeprazole 40 MG capsule   Commonly known as: NEXIUM   Take 40 mg by mouth daily before breakfast.      Fish Oil 1200 MG Caps   Take  by mouth 2 (two) times daily.      furosemide 40 MG tablet   Commonly known as: LASIX   Take 1 tablet (40 mg total) by mouth 2 (two) times daily.      glimepiride 2 MG tablet   Commonly known as: AMARYL   Take 1 mg by mouth 2 (two) times daily. Take 1/2 by mouth every morning if cbg > 150      guaiFENesin 600 MG 12 hr tablet   Commonly known as: MUCINEX   Take 2 tablets (1,200 mg total) by mouth 2 (two) times daily. X 10days      ipratropium 0.02 % nebulizer solution   Commonly known as: ATROVENT   Take 2.5 mLs (0.5 mg total) by nebulization 4 (four) times daily.      levofloxacin 750 MG tablet   Commonly known as: LEVAQUIN   Take 1 tablet (750 mg total) by mouth daily. X 3 days      metFORMIN 500 MG 24 hr tablet   Commonly  known as: GLUCOPHAGE-XR   Take 1 tablet (500 mg total) by mouth 2 (two) times daily.      ONE TOUCH ULTRA TEST test strip   Generic drug: glucose blood   CHECK BLOOD SUGAR TWO TIMES A DAY DX: 250.00      potassium chloride SA 20 MEQ tablet   Commonly known as: K-DUR,KLOR-CON   Take 1 tablet (20 mEq total) by mouth daily.      pravastatin 40 MG tablet   Commonly known as: PRAVACHOL   Take 40 mg by mouth daily.      predniSONE 10 MG tablet   Commonly known as: STERAPRED UNI-PAK   Prednisone dosing: Take  Prednisone 40mg  (4 tabs) x 3 days, then taper to 30mg  (3 tabs) x 3 days, then 20mg  (2 tabs) x 3days, then resume 10mg  (1 tab) daily as your maintenance dose      sennosides-docusate sodium 8.6-50 MG tablet   Commonly known as: SENOKOT-S   Take 1 tablet by mouth at bedtime as needed. For constipation      sertraline 50 MG tablet   Commonly known as: ZOLOFT   Take 1 tablet (50 mg total) by mouth daily.      vitamin C 500 MG tablet   Commonly known as: ASCORBIC ACID   Take 500 mg by mouth daily.      vitamin E 400 UNIT capsule   Take 400 Units by mouth 2 (two) times daily.             Brief H and P: For complete details please refer to  admission H and P, but in brief 71 year old woman with history of oxygen-dependent COPD presented to the emergency department in acute respiratory failure on 01/17/2012. Symptoms began 5 days ago with shortness of breath and productive cough. Since onset her symptoms had continued to worsen. Breathing treatments at home provided no relief. Because of her acute respiratory distress she was started on BiPAP in the emergency department. She denied ever having been intubated.  In the emergency department she was noted be febrile with a temperature 101.4, tachypneic with a respiratory rate 30s-50S, tachycardic with a heart rate 67-137 and normotensive. Oxygen saturations in general high 90s on BiPAP. Laboratory studies available for review included an unremarkable basic metabolic panel, troponin 0.32, BNP 1047, White blood cell count 11.1. Chest x-ray: Edema versus infection. EKG very poor quality and difficult to interpret. Sinus tachycardia. No definite acute changes seen. The patient did not want to be intubated even if not doing so would result in her death.   Hospital Course:   Consultants:  Pulmonology: Recommendations: Continue oxygen, complete 10 days of antibiotics, taper prednisone slowly, change bronchodilator regimen to albuterol/Atrovent QID and budesonide BID with albuterol as needed, all nebulized. Stop Symbicort. Followup with Dr. Delford Field on June 12 at 4:00 PM.   Procedures:  01/18/2012 2-D echocardiogram: Left ventricular ejection fraction 60-65%. Normal wall motion. Grade 1 diastolic dysfunction. 01/23/2012: Chest x-ray 2 view: Right base atelectasis, stable biapical scarring   71 year old female with end-stage COPD, on 3 L O2 via nasal cannula and maintenance steroids was admitted with acute respiratory failure on end-stage COPD requiring BiPAP initially. She was admitted to step down unit, however she did not want to be intubated. Initial troponins were also positive at 1.4. She was not  considered a beta blocker candidate secondary to COPD/end-stage and not an interventional candidate for NSTEMI which was also considered to be demand ischemia rather than a primary  MI. The patient improved significantly on BiPAP in treatment for pneumonia and COPD exacerbation and by 01/19/2012 she was off BiPAP. The antibiotics were narrowed to monotherapy. Pulmonology was consulted and patient was followed closely by Dr. [name]. The adjustment as stated above were made to her COPD regimen. Patient however relapsed again on 01/21/2012 and was placed on 15L VM, she slowly had a progressive improvement and on 01/27/2012 was back to her baseline 3 L of O2 supplementation. Palliative medicine was also consulted for goals of care. Patient's CODE STATUS was addressed and is DNR/DNI. Patient refused any opioids for dyspnea control. Social work was also consulted for SNF placement. 1. Acute respiratory failure on end-stage COPD: Stable now on 3 L oxygen supplementation via nasal cannula. cont O2 supplementation, Lasix BID, continue albuterol and ipratropium, budesonide, prednisone as per pulmonology recommendations. She has an appointment with Dr. Delford Field on June 12th at [facility].  2. Community acquired pneumonia: Continue levofloxacin for 3 more days and #1. 3. COPD exacerbation/oxygen-dependent COPD: continue Pulmicort, prednisone, antibiotics, nebulizer treatments, supplemental oxygen. 4. Non-ST elevation MI: Asymptomatic. Most likely reflects strain rather than acute coronary syndrome/primary event. Completed 48 hours of therapeutic Lovenox. Continue aspirin. No beta blocker secondary to COPD. 2-D echocardiogram showed EF of 60-65% with no regional wall motion abnormalities. Conservative management. Not an interventional candidate. 5. Elevated BNP: 2-D echocardiogram revealed diastolic dysfunction, on Lasix. I recommend checking BMET on Friday, 01/31/12.  6. Conjunctivitis left eye: Stable completed the course  of Polysporin antibiotics. 7. Diabetes mellitus type 2: Continue sliding scale insulin, metformin and Amaryl.    Day of Discharge BP 107/55  Pulse 90  Temp(Src) 97.5 F (36.4 C) (Oral)  Resp 20  Ht 5\' 2"  (1.575 m)  Wt 81.6 kg (179 lb 14.3 oz)  BMI 32.90 kg/m2  SpO2 90%  Physical Exam: General: Alert and awake oriented x3 not in any acute distress. HEENT: anicteric sclera, pupils reactive to light and accommodation CVS: S1-S2 clear no murmur rubs or gallops Chest: Decreased breath sound at the bases, otherwise clear  Abdomen: soft nontender, nondistended, normal bowel sounds, no organomegaly Extremities: no cyanosis, clubbing or edema noted bilaterally Neuro: Cranial nerves II-XII intact, no focal neurological deficits   The results of significant diagnostics from this hospitalization (including imaging, microbiology, ancillary and laboratory) are listed below for reference.    LAB RESULTS: Basic Metabolic Panel:  Lab 01/28/12 2841 01/27/12 1300  NA 137 133*  K 3.2* 5.0  CL 96 92*  CO2 33* 31  GLUCOSE 110* 301*  BUN 24* 27*  CREATININE 1.02 1.03  CALCIUM 8.6 8.9  MG -- --  PHOS -- --   CBC:  Lab 01/27/12 0440 01/24/12 0500  WBC 13.3* 16.2*  NEUTROABS -- --  HGB 14.9 16.5*  HCT 43.8 49.1*  MCV 97.3 --  PLT 285 307   CBG:  Lab 01/28/12 0632 01/27/12 2201  GLUCAP 109* 95    Significant Diagnostic Studies:  Dg Chest Portable 1 View  01/17/2012  *RADIOLOGY REPORT*  Clinical Data:  Shortness of breath, history CHF, COPD, smoking  PORTABLE CHEST - 1 VIEW  Comparison: Portable exam 1330 hours compared to 08/11/2007  Findings: Rotated to the right. Normal heart size and mediastinal contours for rotation. Slight increase in pulmonary vascular interstitial markings versus previous study, cannot exclude minimal edema or infection. No segmental consolidation, pleural effusion or pneumothorax. Pleural thickening or scarring at right apex appears stable. Bones appear  diffusely demineralized. Surgical clips right axilla.  IMPRESSION: Increased interstitial markings versus previous exam, question minimal edema or infection.  Original Report Authenticated By: Lollie Marrow, M.D.     Disposition and Follow-up: Discharge Orders    Future Appointments: Provider: Department: Dept Phone: Center:   02/12/2012 4:00 PM Storm Frisk, MD [facility] [phone] None   03/25/2012 1:45 PM Newt Lukes, MD [facility] [phone] [facility]     Future Orders Please Complete By Expires   Diet Carb Modified      Increase activity slowly          DISPOSITION: SNF  DIET: Carb modified  ACTIVITY: As tolerated  Test to be followed: Please check BMET on Friday 01/31/12  DISCHARGE FOLLOW-UP Follow-up Information    Follow up with [facility]. ([facility] to follow after d/c -please contact when patient ready to leave unit at d/c call [phone] (or [phone] after 5 pm))    Contact information:   [facility]) [address] [place], [place]  [phone]      Follow up with [name], MD. Schedule an appointment as soon as possible for a visit on 02/10/2012. (at 4:00 PM)    Contact information:   [address] [facility] [address] [place] [address] [place] [address] [phone]       Follow up with [name], MD in 2 weeks. (for hospital follow-up)    Contact information:   [address] [facility] [address] [place] [address] [phone]          Time spent on Discharge: 45 minutes  Signed:  Tahmir Kleckner M.D. [facility] 01/28/2012, 10:50 AM

## 2012-01-27 NOTE — Progress Notes (Addendum)
Patient ID: Kaitlin Henderson  female  WGN:[id number]    DOB: 1941/07/28    DOA: 01/17/2012  PCP: [name], MD, MD  Subjective: Sats in 90s on 4 L nasal cannula, alert and awake, weaned down to 3 L O2, sats still fine. No other acute issues overnight  Objective: Weight change: -0.097 kg (-3.4 oz)  Intake/Output Summary (Last 24 hours) at 01/27/12 1240 Last data filed at 01/27/12 1046  Gross per 24 hour  Intake    603 ml  Output   1400 ml  Net   -797 ml   Blood pressure 131/63, pulse 88, temperature 98.2 F (36.8 C), temperature source Oral, resp. rate 18, height 5\' 2"  (1.575 m), weight 81.103 kg (178 lb 12.8 oz), SpO2 90.00%.  Physical Exam: General: Alert and awake, oriented x3, not in any acute distress. HEENT: anicteric sclera, pupils reactive to light and accommodation, EOMI CVS: S1-S2 clear, no murmur rubs or gallops Chest: Decreased breath sounds throughout bilaterally, somewhat shallow Abdomen: soft nontender, nondistended, normal bowel sounds, no organomegaly Extremities: no cyanosis, clubbing or edema noted bilaterally   Lab Results: Basic Metabolic Panel:  Lab 01/24/12 7846  NA --  K --  CL --  CO2 --  GLUCOSE --  BUN --  CREATININE 1.00  CALCIUM --  MG --  PHOS --   CBC:  Lab 01/27/12 0440 01/24/12 0500  WBC 13.3* 16.2*  NEUTROABS -- --  HGB 14.9 16.5*  HCT 43.8 49.1*  MCV 97.3 99.2  PLT 285 307   Cardiac Enzymes: No results found for this basename: CKTOTAL:3,CKMB:3,CKMBINDEX:3,TROPONINI:3 in the last 168 hours BNP: No components found with this basename: POCBNP:2 CBG:  Lab 01/27/12 1039 01/27/12 0620 01/26/12 2137 01/26/12 1552 01/26/12 1058  GLUCAP 185* 211* 187* 227* 207*     Micro Results: Recent Results (from the past 240 hour(s))  CULTURE, BLOOD (ROUTINE X 2)     Status: Normal   Collection Time   01/17/12  5:00 PM      Component Value Range Status Comment   Specimen Description BLOOD ARM LEFT   Final    Special Requests  BOTTLES DRAWN AEROBIC AND ANAEROBIC 10CC   Final    Culture  Setup Time [id number]   Final    Culture NO GROWTH 5 DAYS   Final    Report Status 01/24/2012 FINAL   Final   CULTURE, BLOOD (ROUTINE X 2)     Status: Normal   Collection Time   01/17/12  5:18 PM      Component Value Range Status Comment   Specimen Description BLOOD ARM RIGHT   Final    Special Requests BOTTLES DRAWN AEROBIC ONLY 10CC   Final    Culture  Setup Time [id number]   Final    Culture NO GROWTH 5 DAYS   Final    Report Status 01/24/2012 FINAL   Final   MRSA PCR SCREENING     Status: Normal   Collection Time   01/18/12  3:43 AM      Component Value Range Status Comment   MRSA by PCR NEGATIVE  NEGATIVE  Final   CULTURE, EXPECTORATED SPUTUM-ASSESSMENT     Status: Normal   Collection Time   01/18/12 11:00 AM      Component Value Range Status Comment   Specimen Description SPUTUM   Final    Special Requests NONE   Final    Sputum evaluation     Final    Value: THIS SPECIMEN  IS ACCEPTABLE. RESPIRATORY CULTURE REPORT TO FOLLOW.   Report Status 01/18/2012 FINAL   Final   CULTURE, RESPIRATORY     Status: Normal   Collection Time   01/18/12 11:00 AM      Component Value Range Status Comment   Specimen Description SPUTUM   Final    Special Requests NONE   Final    Gram Stain     Final    Value: MODERATE WBC PRESENT, PREDOMINANTLY PMN     RARE SQUAMOUS EPITHELIAL CELLS PRESENT     RARE GRAM POSITIVE COCCI IN PAIRS     RARE GRAM POSITIVE RODS   Culture NORMAL OROPHARYNGEAL FLORA   Final    Report Status 01/20/2012 FINAL   Final     Studies/Results: Dg Chest Portable 1 View  01/17/2012  *RADIOLOGY REPORT*  Clinical Data:  Shortness of breath, history CHF, COPD, smoking  PORTABLE CHEST - 1 VIEW  Comparison: Portable exam 1330 hours compared to 08/11/2007  Findings: Rotated to the right. Normal heart size and mediastinal contours for rotation. Slight increase in pulmonary vascular interstitial markings versus previous  study, cannot exclude minimal edema or infection. No segmental consolidation, pleural effusion or pneumothorax. Pleural thickening or scarring at right apex appears stable. Bones appear diffusely demineralized. Surgical clips right axilla.  IMPRESSION: Increased interstitial markings versus previous exam, question minimal edema or infection.  Original Report Authenticated By: Lollie Marrow, M.D.    Medications: Scheduled Meds:    . ipratropium  0.5 mg Nebulization Q6H   And  . albuterol  2.5 mg Nebulization Q6H  . ALPRAZolam  0.5 mg Oral TID  . antiseptic oral rinse  15 mL Mouth Rinse q12n4p  . aspirin EC  81 mg Oral Daily  . chlorhexidine  15 mL Mouth/Throat BID  . enoxaparin (LOVENOX) injection  40 mg Subcutaneous Q24H  . furosemide  40 mg Oral BID  . glimepiride  1 mg Oral BID WC  . guaiFENesin  1,200 mg Oral BID  . insulin aspart  0-15 Units Subcutaneous TID WC  . levofloxacin  750 mg Oral Q24H  .  morphine injection  1 mg Intravenous Once  . pantoprazole  40 mg Oral Daily  . potassium chloride SA  20 mEq Oral BID  . predniSONE  50 mg Oral Q breakfast  . sertraline  50 mg Oral Daily  . simvastatin  40 mg Oral q1800  . sodium chloride  3 mL Intravenous Q12H   Continuous Infusions:    Assessment/Plan:  1. 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 - appreciate pulmonology recommendations by Dr. Delford Field, felt she was acceptable for SNF transfer however no facility was available.   2. Community acquired pneumonia:  Continue levofloxacin and #1.  3. COPD exacerbation/oxygen-dependent COPD:  continue Pulmicort, prednisone, antibiotics, nebulizer treatments, supplemental oxygen.  4. Non-ST elevation MI: Asymptomatic. Most likely reflects strain rather than acute coronary syndrome/primary  event. Completed 48 hours of therapeutic Lovenox. Continue aspirin. No beta blocker secondary to COPD. 2-D echocardiogram showed EF of 60-65% with no regional wall motion abnormalities. Conservative management. Not an interventional candidate.  5. Elevated BNP: 2-D echocardiogram revealed diastolic dysfunction, on Lasix  6. Conjunctivitis left eye: Stable completed the course of Polysporin antibiotics.   7. Diabetes mellitus type 2:  Continue sliding scale insulin and  Amaryl. Hold metformin while hospitalized.  Goals of care: Goals of care done by palliative medicine, DNR/DNI. Difficult disposition as patient was not agreeing to SNF until 01/24/2012 and then subsequently requested for [facility] snf however she was declined by [facility]. Updated the patient by the social worker to choose several other facilities, however on today's encounter patient states that she has not looked at the list and states that Dr Delford Field will get her into [facility]. I explained to the patient that she needs to look at the list and give the social worker more choices for other SNF's. Patient will need 24-hour supervision and maximizing the resources, patient does not allow me to talk to her daughter. Discharging patient home alone is unsafe unless she has 24-hour supervision. Will also discuss with home health hospice as to how much assistance they can provide if she chooses to be DC'ed home.      Prophylaxis: Lovenox  Code Status: DO NOT RESUSCITATE  Disposition: Unclear until safe discharge option is available.     LOS: 10 days   Jaclin Finks M.D. [facility] 01/27/2012, 12:40 PM Pager: [phone]   Patient ID: Kaitlin Henderson  female  JYN:[id number]    DOB: 28-Oct-1940    DOA: 01/17/2012  PCP: [name], MD, MD  Subjective: Sats in 90s on 4 L nasal cannula, resting with her eyes closed, easily arousable, states that she has not looked at the list from the social worker and states that "Dr. Delford Field will get  her into [facility] SNF". No other acute issues overnight  Objective: Weight change: -0.097 kg (-3.4 oz)  Intake/Output Summary (Last 24 hours) at 01/27/12 1240 Last data filed at 01/27/12 1046  Gross per 24 hour  Intake    603 ml  Output   1400 ml  Net   -797 ml   Blood pressure 131/63, pulse 88, temperature 98.2 F (36.8 C), temperature source Oral, resp. rate 18, height 5\' 2"  (1.575 m), weight 81.103 kg (178 lb 12.8 oz), SpO2 90.00%.  Physical Exam: General: Alert and awake, oriented x3, not in any acute distress. HEENT: anicteric sclera, pupils reactive to light and accommodation, EOMI CVS: S1-S2 clear, no murmur rubs or gallops Chest: Decreased breath sounds throughout bilaterally, somewhat shallow Abdomen: soft nontender, nondistended, normal bowel sounds, no organomegaly Extremities: no cyanosis, clubbing or edema noted bilaterally   Lab Results: Basic Metabolic Panel:  Lab 01/24/12 8657  NA --  K --  CL --  CO2 --  GLUCOSE --  BUN --  CREATININE 1.00  CALCIUM --  MG --  PHOS --   CBC:  Lab 01/27/12 0440 01/24/12 0500  WBC 13.3* 16.2*  NEUTROABS -- --  HGB 14.9 16.5*  HCT 43.8 49.1*  MCV 97.3 99.2  PLT 285 307   Cardiac Enzymes: No results found for this basename: CKTOTAL:3,CKMB:3,CKMBINDEX:3,TROPONINI:3 in the last 168 hours BNP: No components found with this basename: POCBNP:2 CBG:  Lab 01/27/12 1039 01/27/12 0620 01/26/12 2137 01/26/12 1552 01/26/12 1058  GLUCAP 185* 211* 187* 227* 207*     Micro Results: Recent Results (from the past 240 hour(s))  CULTURE, BLOOD (ROUTINE X 2)     Status: Normal   Collection Time   01/17/12  5:00 PM      Component Value Range Status Comment   Specimen Description BLOOD ARM LEFT   Final    Special Requests BOTTLES DRAWN AEROBIC AND ANAEROBIC 10CC   Final    Culture  Setup Time [id number]   Final  Culture NO GROWTH 5 DAYS   Final    Report Status 01/24/2012 FINAL   Final   CULTURE, BLOOD (ROUTINE X 2)      Status: Normal   Collection Time   01/17/12  5:18 PM      Component Value Range Status Comment   Specimen Description BLOOD ARM RIGHT   Final    Special Requests BOTTLES DRAWN AEROBIC ONLY 10CC   Final    Culture  Setup Time [id number]   Final    Culture NO GROWTH 5 DAYS   Final    Report Status 01/24/2012 FINAL   Final   MRSA PCR SCREENING     Status: Normal   Collection Time   01/18/12  3:43 AM      Component Value Range Status Comment   MRSA by PCR NEGATIVE  NEGATIVE  Final   CULTURE, EXPECTORATED SPUTUM-ASSESSMENT     Status: Normal   Collection Time   01/18/12 11:00 AM      Component Value Range Status Comment   Specimen Description SPUTUM   Final    Special Requests NONE   Final    Sputum evaluation     Final    Value: THIS SPECIMEN IS ACCEPTABLE. RESPIRATORY CULTURE REPORT TO FOLLOW.   Report Status 01/18/2012 FINAL   Final   CULTURE, RESPIRATORY     Status: Normal   Collection Time   01/18/12 11:00 AM      Component Value Range Status Comment   Specimen Description SPUTUM   Final    Special Requests NONE   Final    Gram Stain     Final    Value: MODERATE WBC PRESENT, PREDOMINANTLY PMN     RARE SQUAMOUS EPITHELIAL CELLS PRESENT     RARE GRAM POSITIVE COCCI IN PAIRS     RARE GRAM POSITIVE RODS   Culture NORMAL OROPHARYNGEAL FLORA   Final    Report Status 01/20/2012 FINAL   Final     Studies/Results: Dg Chest Portable 1 View  01/17/2012  *RADIOLOGY REPORT*  Clinical Data:  Shortness of breath, history CHF, COPD, smoking  PORTABLE CHEST - 1 VIEW  Comparison: Portable exam 1330 hours compared to 08/11/2007  Findings: Rotated to the right. Normal heart size and mediastinal contours for rotation. Slight increase in pulmonary vascular interstitial markings versus previous study, cannot exclude minimal edema or infection. No segmental consolidation, pleural effusion or pneumothorax. Pleural thickening or scarring at right apex appears stable. Bones appear diffusely demineralized.  Surgical clips right axilla.  IMPRESSION: Increased interstitial markings versus previous exam, question minimal edema or infection.  Original Report Authenticated By: Lollie Marrow, M.D.    Medications: Scheduled Meds:    . ipratropium  0.5 mg Nebulization Q6H   And  . albuterol  2.5 mg Nebulization Q6H  . ALPRAZolam  0.5 mg Oral TID  . antiseptic oral rinse  15 mL Mouth Rinse q12n4p  . aspirin EC  81 mg Oral Daily  . chlorhexidine  15 mL Mouth/Throat BID  . enoxaparin (LOVENOX) injection  40 mg Subcutaneous Q24H  . furosemide  40 mg Oral BID  . glimepiride  1 mg Oral BID WC  . guaiFENesin  1,200 mg Oral BID  . insulin aspart  0-15 Units Subcutaneous TID WC  . levofloxacin  750 mg Oral Q24H  .  morphine injection  1 mg Intravenous Once  . pantoprazole  40 mg Oral Daily  . potassium chloride SA  20 mEq Oral BID  .  predniSONE  50 mg Oral Q breakfast  . sertraline  50 mg Oral Daily  . simvastatin  40 mg Oral q1800  . 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 Oxygen dependent, 3 L at home.  - cont O2 supplementation, on 3 L, Lasix BID, repeat chest x-ray stable  - continue empiric treatment for pneumonia and COPD exacerbation. - appreciate pulmonology recommendations by Dr. Delford Field, felt she was acceptable for SNF transfer however no facility was available.   Community acquired pneumonia:  Continue levofloxacin and #1.  COPD exacerbation/oxygen-dependent COPD:  continue Pulmicort, prednisone, antibiotics, nebulizer treatments, supplemental oxygen.  Non-ST elevation MI: Asymptomatic. Most likely reflects strain rather than acute coronary syndrome/primary event. Completed 48 hours of therapeutic Lovenox. Continue aspirin. No beta blocker secondary to COPD. 2-D echocardiogram showed EF of 60-65% with no regional wall motion abnormalities.  Conservative management. Not an interventional candidate.  Elevated BNP: 2-D echocardiogram revealed diastolic dysfunction, on Lasix  Conjunctivitis left eye: Stable completed the course of Polysporin antibiotics.   Diabetes mellitus type 2:  Continue sliding scale insulin and Amaryl. Hold metformin while hospitalized.  Goals of care: Goals of care done by palliative medicine, DNR/DNI. Difficult disposition as patient was not agreeing to SNF until 01/24/2012 and then subsequently requested for [facility] snf however she was declined by [facility]. Social worker subsequently provided with further choices of SNF's. She was accepted [facility]  today, however she adamantly refuses to go to this SNF stating that her 'friends and family' cannot visit there. However during the whole hospitalization, she has refused to discuss her care with her family. Patient is ready for discharge. Discussed in detail with social work, [employer], who will continue to facilitate DC to SNF's available in [place].   Prophylaxis: Lovenox  Code Status: DO NOT RESUSCITATE  Disposition: DC tomorrow.    LOS: 10 days   Kaira Stringfield M.D. [facility] 01/27/2012, 12:40 PM Pager: [phone]

## 2012-01-27 NOTE — Progress Notes (Addendum)
CSW spoke with patient about her bed offers. Patient reported being interested in [facility] and [place] [facility].  [facility] however [facility] not. CSW has contacted both facilities to see about availability. Due to the holiday, [facility] has no one working on admissions. CSW  Left message for [facility]. CSW will continue to f/u.   CSW informed patient that she has a bed at [facility]. Patient became extremely agitated and is refusing to go to the facility. Patient was offered beds at [facility] and [facility]. Patient is requesting that [facility]W f[facility] with [facility] in the am post holiday. CSW will f/u with [facility]. If not an option patient will have to choose a bed from her offers.     Sabino Niemann, [place] [phone]

## 2012-01-28 DIAGNOSIS — J9600 Acute respiratory failure, unspecified whether with hypoxia or hypercapnia: Secondary | ICD-10-CM

## 2012-01-28 DIAGNOSIS — I214 Non-ST elevation (NSTEMI) myocardial infarction: Secondary | ICD-10-CM

## 2012-01-28 DIAGNOSIS — J438 Other emphysema: Secondary | ICD-10-CM

## 2012-01-28 DIAGNOSIS — D696 Thrombocytopenia, unspecified: Secondary | ICD-10-CM

## 2012-01-28 LAB — BASIC METABOLIC PANEL
BUN: 24 mg/dL — ABNORMAL HIGH (ref 6–23)
CO2: 33 mEq/L — ABNORMAL HIGH (ref 19–32)
Chloride: 96 mEq/L (ref 96–112)
Glucose, Bld: 110 mg/dL — ABNORMAL HIGH (ref 70–99)
Potassium: 3.2 mEq/L — ABNORMAL LOW (ref 3.5–5.1)
Sodium: 137 mEq/L (ref 135–145)

## 2012-01-28 LAB — GLUCOSE, CAPILLARY
Glucose-Capillary: 109 mg/dL — ABNORMAL HIGH (ref 70–99)
Glucose-Capillary: 194 mg/dL — ABNORMAL HIGH (ref 70–99)

## 2012-01-28 MED ORDER — POTASSIUM CHLORIDE CRYS ER 20 MEQ PO TBCR: 20.0000 meq | EXTENDED_RELEASE_TABLET | Freq: Every day | ORAL | Status: DC

## 2012-01-28 MED ORDER — POTASSIUM CHLORIDE CRYS ER 20 MEQ PO TBCR
40.0000 meq | EXTENDED_RELEASE_TABLET | Freq: Once | ORAL | Status: AC
  Administered 2012-01-28: 40 meq via ORAL
  Filled 2012-01-28: qty 2

## 2012-01-28 NOTE — Progress Notes (Signed)
Clinical social worker assisted with patient discharge to skilled nursing facility,[facility].  CSW addressed all patient's questions and concerns. CSW copied chart and added all important documents. CSW also set up patient transportation with [employer]. Clinical Social Worker will sign off for now as social work intervention is no longer needed.   Sabino Niemann, MSW, [employer] [phone]

## 2012-01-28 NOTE — Progress Notes (Signed)
Palliative Medicine Team SW Brief check in, pt on the phone. Note discharge summary, no further needs at this time.   Kennieth Francois, [place] Pager [phone]

## 2012-01-28 NOTE — Progress Notes (Signed)
Pt has been educated, given Heart Failure Packet and ready for discharge.  Pt is being transported to SNF via ambulance.

## 2012-01-28 NOTE — Progress Notes (Signed)
Pt's potassium 3.2. Notified MD and new orders placed. Will continue to monitor.

## 2012-01-28 NOTE — Progress Notes (Signed)
01/28/2012 Around 0200 this am was notified by monitor tech that patient had a 5 beat run of v-tach this am. Micah Flesher into patient room to assess. She was A/Ox 3 and was asymptomatic. VS were taken and were documented. Patient also asked to use the bedside commode after vitals were taken and was able to tolerate ambulating to the bedside commode to the bed well.

## 2012-01-28 NOTE — Progress Notes (Signed)
Occupational Therapy Treatment Patient Details Name: Kaitlin Henderson MRN: [id number] DOB: 20-Feb-1941 Today's Date: 01/28/2012 Time: 4098-1191 OT Time Calculation (min): 21 min  OT Assessment / Plan / Recommendation Comments on Treatment Session Pt. making good progress today and activity tolerance is improving during ADL tasks     Follow Up Recommendations  Home health OT;Supervision - Intermittent;Skilled nursing facility       Equipment Recommendations  Defer to next venue       Frequency Min 2X/week   Plan Discharge plan remains appropriate    Precautions / Restrictions Precautions Precautions: Fall Precaution Comments: O2 dependent Restrictions Weight Bearing Restrictions: No       ADL  Grooming: Performed;Wash/dry hands;Wash/dry face;Brushing hair;Min guard;Set up Where Assessed - Grooming: Unsupported standing Upper Body Dressing: Performed;Minimal assistance (don gown) Where Assessed - Upper Body Dressing: Supported sitting Toilet Transfer: Simulated;Min guard Statistician Method: Surveyor, minerals: Other (comment) (recliner) Transfers/Ambulation Related to ADLs: Pt. min guard ~8' with RW and min verbal cues for hand placement and safety ADL Comments: Pt. provided with education on LB ADL techniques to conserve energy and alos breathing techniques to improvie activity tolerance due to pt. becoming SOB with ADL tasks standing at sink ~31mins. Pt. on 3LO2 throughout session      OT Goals Acute Rehab OT Goals OT Goal Formulation: With patient Time For Goal Achievement: 02/04/12 Potential to Achieve Goals: Good ADL Goals Pt Will Perform Grooming: with supervision;Standing at sink ADL Goal: Grooming - Progress: Progressing toward goals Pt Will Transfer to Toilet: with supervision;Ambulation;Raised toilet seat with arms ADL Goal: Toilet Transfer - Progress: Progressing toward goals Additional ADL Goal #1: Pt. will use 2 energy conservation  techniques with an ADL task ADL Goal: Additional Goal #1 - Progress: Progressing toward goals  Visit Information  Last OT Received On: 01/28/12 Assistance Needed: +1          Cognition  Overall Cognitive Status: Appears within functional limits for tasks assessed/performed Arousal/Alertness: Awake/alert Orientation Level: Appears intact for tasks assessed Behavior During Session: [facility] for tasks performed    Mobility Bed Mobility Bed Mobility: Not assessed Transfers Sit to Stand: 5: Supervision;From bed;With upper extremity assist Stand to Sit: 5: Supervision;To bed         End of Session OT - End of Session Equipment Utilized During Treatment: Gait belt Activity Tolerance: Patient tolerated treatment well Patient left: with call bell/phone within reach;in bed Nurse Communication: Mobility status;Other (comment)   Tiffanee Mcnee, OTR/L Pager [phone] 01/28/2012, 9:51 AM

## 2012-01-28 NOTE — Progress Notes (Signed)
Cosign for [name] Albrecht RN assessment, med admin, and notes. 

## 2012-01-30 ENCOUNTER — Telehealth: Payer: Self-pay | Admitting: *Deleted

## 2012-01-30 NOTE — Telephone Encounter (Signed)
Per msg from Dr. Delford Field on 01/28/12, pt will need at follow up in 7 days as she has recently been in the hospital.  He does not want her waiting til pending OV on June 10 with him.  I spoke with him regarding this.  He is ok with pt coming in on June 5.  I have called, spoke with pt.  She is scheduled for a HFU with Dr. Delford Field on June 5th at 9:30 am.  She is currently at [facility] and has spoken with nurse regarding transportation.

## 2012-02-04 ENCOUNTER — Telehealth: Payer: Self-pay | Admitting: Critical Care Medicine

## 2012-02-04 NOTE — Telephone Encounter (Signed)
I spoke with Kaitlin Henderson and she states the # to reach Dr. Rhunette Croft is to call the main # [phone], ask to speak with doctor, and she is only in on Tuesday from 8:30-1 pm. Kaitlin Henderson states she can;t give out MD's # bc she is at different facilities daily. Will forward to Dr. Delford Field so he is aware

## 2012-02-04 NOTE — Telephone Encounter (Signed)
That is not practical for me so I wont be calling  The MD

## 2012-02-04 NOTE — Telephone Encounter (Signed)
I am happy to speak to the Dr but did not request  Get the MDs number and I will phone her/him

## 2012-02-04 NOTE — Telephone Encounter (Signed)
I spoke with [place] and she states pt told Dr. Rhunette Croft (at [facility]) that PW wanted to speak with him. Deon Pilling is wanting to confirm this. I do not see any mention of this in pt chart but will forward to PW to verify. Please advise Dr. Delford Field thanks

## 2012-02-05 ENCOUNTER — Encounter: Payer: Self-pay | Admitting: Critical Care Medicine

## 2012-02-05 ENCOUNTER — Ambulatory Visit (INDEPENDENT_AMBULATORY_CARE_PROVIDER_SITE_OTHER): Payer: Medicare Other | Admitting: Critical Care Medicine

## 2012-02-05 VITALS — BP 106/72 | HR 87 | Temp 98.7°F | Ht 62.0 in | Wt 181.0 lb

## 2012-02-05 DIAGNOSIS — J449 Chronic obstructive pulmonary disease, unspecified: Secondary | ICD-10-CM

## 2012-02-05 NOTE — Patient Instructions (Signed)
No change in medications. Return in        6 weeks  

## 2012-02-05 NOTE — Assessment & Plan Note (Signed)
Gold stage D. COPD with recent hospitalization for community acquired pneumonia with associated acute on chronic respiratory failure and non-ST segment myocardial infarction Patient now improved and stabilized in outpatient nursing rehabilitation setting Plan Maintain prednisone 10 mg daily Maintain albuterol and Atrovent by nebulization 4 times daily and budesonide twice daily Maintain oxygen therapy From a pulmonary standpoint the patient appears ready for home discharge and will have outpatient hospice following

## 2012-02-05 NOTE — Progress Notes (Signed)
Subjective:    Patient ID: Kaitlin Henderson, female    DOB: 1941/05/21, 71 y.o.   MRN: [id number]  HPI  71 y.o.WF copd Golds D    11/25/2011 At the last office visit we continued Spiriva and add Symbicort and discontinued Advair.  Now has mold under the house.  Coughing up pearls.  Not in bedroom.  Dyspnea is the same.   No chest pain.  No edema in feet. Pt denies any significant sore throat, nasal congestion or excess secretions, fever, chills, sweats, unintended weight loss, pleurtic or exertional chest pain, orthopnea PND, or leg swelling Pt denies any increase in rescue therapy over baseline, denies waking up needing it or having any early am or nocturnal exacerbations of coughing/wheezing/or dyspnea. Pt also denies any obvious fluctuation in symptoms with  weather or environmental change or other alleviating or aggravating factors  02/05/2012 Pt in hosp end of May with copd exac 5/17- 5/28 . [facility] with [facility].  Making some progress. Notes less labored breathing.  No chest pain.  Pt will cough up thick pearls of mucus. No real edema in the feet.  Participating in PT , walking , elastic band, bounce balls.  Pt went to the kitchen for dishes.           Past Medical History  Diagnosis Date  . Breast cancer, right breast 1998    s/p chemo & XRT, right mastectomy  . OBESITY   . CORONARY ARTERY DISEASE   . APHTHOUS STOMATITIS   . DEPRESSION     started sertraline 09/2010  . DIABETES, TYPE 2 dx 03/2010  . HYPERLIPIDEMIA   . Hypothyroidism   . C O P D     chronic O2 3LPM [place]  . Essential tremor      Family History  Problem Relation Age of Onset  . Diabetes Neg Hx   . Cancer Neg Hx      History   Social History  . Marital Status: Widowed    Spouse Name: N/A    Number of Children: N/A  . Years of Education: N/A   Occupational History  . Not on file.   Social History Main Topics  . Smoking status: Former Smoker -- 2.5 packs/day for 54 years    Types:  Cigarettes    Quit date: 09/02/2006  . Smokeless tobacco: Never Used   Comment: Retired. Widow/widower since 2008-lives alone. Home hospice since 06/2009 related to COPD  . Alcohol Use: No  . Drug Use: No  . Sexually Active: Not on file   Other Topics Concern  . Not on file   Social History Narrative  . No narrative on file     Allergies  Allergen Reactions  . Codeine     REACTION: makes pt pass out  . Levaquin (Levofloxacin In D5w) Other (See Comments)    redness  . Sulfonamide Derivatives     REACTION: edema     Outpatient Prescriptions Prior to Visit  Medication Sig Dispense Refill  . albuterol (PROVENTIL) (5 MG/ML) 0.5% nebulizer solution Take 0.5 mLs (2.5 mg total) by nebulization 4 (four) times daily.  20 mL    . ALPRAZolam (XANAX) 0.5 MG tablet Take 1 tablet (0.5 mg total) by mouth 2 (two) times daily.  30 tablet  0  . Ascorbic Acid (VITAMIN C) 500 MG tablet Take 500 mg by mouth daily.        [name] aspirin 81 MG tablet Take 81 mg by mouth  daily.        . benzonatate (TESSALON) 100 MG capsule Take 100 mg by mouth every 8 (eight) hours as needed. For cough      . budesonide (PULMICORT) 0.5 MG/2ML nebulizer solution Take 2 mLs (0.5 mg total) by nebulization 2 (two) times daily.  120 mL  11  . esomeprazole (NEXIUM) 40 MG capsule Take 40 mg by mouth daily before breakfast.      . furosemide (LASIX) 40 MG tablet Take 1 tablet (40 mg total) by mouth 2 (two) times daily.  30 tablet    . glimepiride (AMARYL) 2 MG tablet Take 1 mg by mouth 2 (two) times daily. Take 1/2 by mouth every morning if cbg > 150      . guaiFENesin (MUCINEX) 600 MG 12 hr tablet Take 2 tablets (1,200 mg total) by mouth 2 (two) times daily. X 10days      . ipratropium (ATROVENT) 0.02 % nebulizer solution Take 2.5 mLs (0.5 mg total) by nebulization 4 (four) times daily.  75 mL    . Multiple Vitamins-Minerals (CENTRUM SILVER) tablet Take 1 tablet by mouth daily.        . Omega-3 Fatty Acids (FISH OIL) 1200 MG CAPS  Take by mouth 2 (two) times daily.        . ONE TOUCH ULTRA TEST test strip CHECK BLOOD SUGAR TWO TIMES A DAY DX: 250.00  50 each  3  . potassium chloride SA (KLOR-CON M20) 20 MEQ tablet Take 1 tablet (20 mEq total) by mouth daily.  60 tablet  2  . pravastatin (PRAVACHOL) 40 MG tablet Take 40 mg by mouth daily.      . predniSONE (STERAPRED UNI-PAK) 10 MG tablet Prednisone dosing: Take  Prednisone 40mg  (4 tabs) x 3 days, then taper to 30mg  (3 tabs) x 3 days, then 20mg  (2 tabs) x 3days, then resume 10mg  (1 tab) daily as your maintenance dose      . sennosides-docusate sodium (SENOKOT-S) 8.6-50 MG tablet Take 1 tablet by mouth at bedtime as needed. For constipation      . vitamin E 400 UNIT capsule Take 400 Units by mouth 2 (two) times daily.       . metFORMIN (GLUCOPHAGE-XR) 500 MG 24 hr tablet Take 1 tablet (500 mg total) by mouth 2 (two) times daily.  60 tablet  5  . sertraline (ZOLOFT) 50 MG tablet Take 1 tablet (50 mg total) by mouth daily.      [name] levofloxacin (LEVAQUIN) 750 MG tablet Take 1 tablet (750 mg total) by mouth daily. X 3 days         Review of Systems  Constitutional:   No  weight loss, night sweats,  Fevers, chills, fatigue, lassitude. HEENT:   No headaches,  Difficulty swallowing,  Tooth/dental problems,  Sore throat,                No sneezing, itching, ear ache, nasal congestion, post nasal drip,   CV:  No chest pain,  Orthopnea, PND, swelling in lower extremities, anasarca, dizziness, palpitations  GI  No heartburn, indigestion, abdominal pain, nausea, vomiting, diarrhea, change in bowel habits, loss of appetite  Resp: Notes  shortness of breath with exertion not  at rest.  No excess mucus, no productive cough,  No non-productive cough,  No coughing up of blood.  No change in color of mucus.  No wheezing.  No chest wall deformity  Skin: no rash or lesions.  GU: no dysuria, change  in color of urine, no urgency or frequency.  No flank pain.  MS:  No joint pain or  swelling.  No decreased range of motion.  No back pain.  Psych:  No change in mood or affect. No depression or anxiety.  No memory loss. t    Objective:   Physical Exam  Filed Vitals:   02/05/12 0934  BP: 106/72  Pulse: 87  Temp: 98.7 F (37.1 C)  TempSrc: Oral  Height: 5\' 2"  (1.575 m)  Weight: 181 lb (82.101 kg)  SpO2: 93%    Gen: Pleasant, well-nourished, in no distress,  normal affect  ENT: No lesions,  mouth clear,  oropharynx clear, no postnasal drip  Neck: No JVD, no TMG, no carotid bruits  Lungs: No use of accessory muscles, no dullness to percussion, distant BS   Cardiovascular: RRR, heart sounds normal, no murmur or gallops, no peripheral edema  Abdomen: soft and NT, no HSM,  BS normal  Musculoskeletal: No deformities, no cyanosis or clubbing  Neuro: alert, non focal  Skin: Warm, no lesions or rashes   PFT Conversion 12/06/2008  FVC 1.95  FVC PREDICT 2.76  FVC  % Predicted 71  FEV1 1.01  FEV1 PREDICT 1.97  FEV % Predicted 51  FEV1/FVC 51.8  FEV1/FVC PRE 72  FEV1/FVC%EXP   FeF 25-75 0.31  FeF 25-75 % Predicted 2.29  FEF % EXPEC 13  POST FVC 2.08  FVCPRDPST 2.76  POST FVC%EXP 75  POST FEV1 1.16  FEV1PRDPST 1.97  POSTFEV1%PRD 59  PSTFEV1/FVC 56  FEV1FVCPRDPS 72  PSTFEF25/75% 0.46  PSTFEF25/75P 2.29  FEF2575%EXPS 20  Residual Volume (ml) 2.86  RV % EXPECT 156  DLCO (ml/mmHg sec) 12.8  DLCO % EXPEC 53  DLCO/VA 4.35  DLCO/VA%EXP 122  PFT RSLT severe obstruction with significant bronchodilator response   PFT Conversion 09/25/2010  FVC   FVC PREDICT 2.755  FVC  % Predicted 54.77  FEV1   FEV1 PREDICT 2.081  FEV % Predicted 42.52  FEV1/FVC   FEV1/FVC PRE 75.934  FEV1/FVC%EXP 77.220  FeF 25-75   FeF 25-75 % Predicted   FEF % EXPEC 27.860  POST FVC   FVCPRDPST   POST FVC%EXP   POST FEV1   FEV1PRDPST   POSTFEV1%PRD   PSTFEV1/FVC   FEV1FVCPRDPS   PSTFEF25/75%   PSTFEF25/75P   FEF2575%EXPS   Residual Volume (ml)   RV % EXPECT     DLCO (ml/mmHg sec)   DLCO % EXPEC   DLCO/VA   DLCO/VA%EXP   PFT RSLT    Assessment & Plan:   C O P D Gold stage D. COPD with recent hospitalization for community acquired pneumonia with associated acute on chronic respiratory failure and non-ST segment myocardial infarction Patient now improved and stabilized in outpatient nursing rehabilitation setting Plan Maintain prednisone 10 mg daily Maintain albuterol and Atrovent by nebulization 4 times daily and budesonide twice daily Maintain oxygen therapy From a pulmonary standpoint the patient appears ready for home discharge and will have outpatient hospice following     Updated Medication List Outpatient Encounter Prescriptions as of 02/05/2012  Medication Sig Dispense Refill  . albuterol (PROVENTIL) (5 MG/ML) 0.5% nebulizer solution Take 0.5 mLs (2.5 mg total) by nebulization 4 (four) times daily.  20 mL    . ALPRAZolam (XANAX) 0.5 MG tablet Take 1 tablet (0.5 mg total) by mouth 2 (two) times daily.  30 tablet  0  . Ascorbic Acid (VITAMIN C) 500 MG tablet Take 500 mg by mouth daily.        [name]  aspirin 81 MG tablet Take 81 mg by mouth daily.        . benzonatate (TESSALON) 100 MG capsule Take 100 mg by mouth every 8 (eight) hours as needed. For cough      . budesonide (PULMICORT) 0.5 MG/2ML nebulizer solution Take 2 mLs (0.5 mg total) by nebulization 2 (two) times daily.  120 mL  11  . esomeprazole (NEXIUM) 40 MG capsule Take 40 mg by mouth daily before breakfast.      . furosemide (LASIX) 40 MG tablet Take 1 tablet (40 mg total) by mouth 2 (two) times daily.  30 tablet    . glimepiride (AMARYL) 2 MG tablet Take 1 mg by mouth 2 (two) times daily. Take 1/2 by mouth every morning if cbg > 150      . guaiFENesin (MUCINEX) 600 MG 12 hr tablet Take 2 tablets (1,200 mg total) by mouth 2 (two) times daily. X 10days      . ipratropium (ATROVENT) 0.02 % nebulizer solution Take 2.5 mLs (0.5 mg total) by nebulization 4 (four) times daily.  75 mL     . Multiple Vitamins-Minerals (CENTRUM SILVER) tablet Take 1 tablet by mouth daily.        . Omega-3 Fatty Acids (FISH OIL) 1200 MG CAPS Take by mouth 2 (two) times daily.        . ONE TOUCH ULTRA TEST test strip CHECK BLOOD SUGAR TWO TIMES A DAY DX: 250.00  50 each  3  . potassium chloride SA (KLOR-CON M20) 20 MEQ tablet Take 1 tablet (20 mEq total) by mouth daily.  60 tablet  2  . pravastatin (PRAVACHOL) 40 MG tablet Take 40 mg by mouth daily.      . predniSONE (STERAPRED UNI-PAK) 10 MG tablet Prednisone dosing: Take  Prednisone 40mg  (4 tabs) x 3 days, then taper to 30mg  (3 tabs) x 3 days, then 20mg  (2 tabs) x 3days, then resume 10mg  (1 tab) daily as your maintenance dose      . sennosides-docusate sodium (SENOKOT-S) 8.6-50 MG tablet Take 1 tablet by mouth at bedtime as needed. For constipation      . vitamin E 400 UNIT capsule Take 400 Units by mouth 2 (two) times daily.       [name] DISCONTD: metFORMIN (GLUCOPHAGE-XR) 500 MG 24 hr tablet Take 1 tablet (500 mg total) by mouth 2 (two) times daily.  60 tablet  5  . DISCONTD: sertraline (ZOLOFT) 50 MG tablet Take 1 tablet (50 mg total) by mouth daily.      [name] DISCONTD: levofloxacin (LEVAQUIN) 750 MG tablet Take 1 tablet (750 mg total) by mouth daily. X 3 days

## 2012-02-06 NOTE — Consult Note (Signed)
Agree with above 

## 2012-02-11 ENCOUNTER — Telehealth: Payer: Self-pay | Admitting: Critical Care Medicine

## 2012-02-11 NOTE — Telephone Encounter (Signed)
yes

## 2012-02-11 NOTE — Telephone Encounter (Signed)
Spoke with [employer] and notified of recs per PW. She verbalized understanding and states nothing further needed.

## 2012-02-11 NOTE — Telephone Encounter (Signed)
Dr. Delford Field please advise, thanks

## 2012-02-12 ENCOUNTER — Telehealth: Payer: Self-pay | Admitting: Critical Care Medicine

## 2012-02-12 ENCOUNTER — Inpatient Hospital Stay: Payer: Medicare Other | Admitting: Critical Care Medicine

## 2012-02-12 NOTE — Telephone Encounter (Signed)
Called, spoke with Larita Fife.  States pt is currently on 3 LPM.  She would like to know if Dr. Delford Field would like pt to stay at this liter flow, or if he would like to give an order stating pt can go up to a certain Liter.  She is aware he will be in office this afternoon and ok with this.  Dr. Delford Field, pls advise.  Thank you.  Note:  Per Larita Fife, if she does not answer when we call back, pls leave a detailed msg on above #.  She will call back if any further questions.

## 2012-02-12 NOTE — Telephone Encounter (Signed)
Ok to go to 4L prn

## 2012-02-12 NOTE — Telephone Encounter (Signed)
Spoke with [facility] of recs of 4L prn per PW.

## 2012-02-17 ENCOUNTER — Telehealth: Payer: Self-pay | Admitting: *Deleted

## 2012-02-17 NOTE — Telephone Encounter (Signed)
Hospice called to see if pt can have Potassium level drawn tomorrow instead of Wednesday as ordered since Hospice RN is going out tomorrow for visit.

## 2012-02-17 NOTE — Telephone Encounter (Signed)
ok 

## 2012-02-18 NOTE — Telephone Encounter (Signed)
Kaitlin Henderson at [facility] informed via VM and to callback office with any questions/concerns.

## 2012-03-02 ENCOUNTER — Other Ambulatory Visit (INDEPENDENT_AMBULATORY_CARE_PROVIDER_SITE_OTHER): Payer: Medicare Other

## 2012-03-02 ENCOUNTER — Ambulatory Visit (INDEPENDENT_AMBULATORY_CARE_PROVIDER_SITE_OTHER): Payer: Medicare Other | Admitting: Internal Medicine

## 2012-03-02 ENCOUNTER — Ambulatory Visit (INDEPENDENT_AMBULATORY_CARE_PROVIDER_SITE_OTHER): Payer: Medicare Other | Admitting: Critical Care Medicine

## 2012-03-02 ENCOUNTER — Encounter: Payer: Self-pay | Admitting: Internal Medicine

## 2012-03-02 ENCOUNTER — Encounter: Payer: Self-pay | Admitting: Critical Care Medicine

## 2012-03-02 VITALS — BP 112/68 | HR 93 | Temp 98.4°F | Ht 62.5 in | Wt 173.8 lb

## 2012-03-02 VITALS — BP 140/72 | HR 91 | Temp 98.4°F | Ht 62.0 in | Wt 164.8 lb

## 2012-03-02 DIAGNOSIS — J4489 Other specified chronic obstructive pulmonary disease: Secondary | ICD-10-CM

## 2012-03-02 DIAGNOSIS — J449 Chronic obstructive pulmonary disease, unspecified: Secondary | ICD-10-CM

## 2012-03-02 DIAGNOSIS — F329 Major depressive disorder, single episode, unspecified: Secondary | ICD-10-CM

## 2012-03-02 DIAGNOSIS — E876 Hypokalemia: Secondary | ICD-10-CM

## 2012-03-02 DIAGNOSIS — E119 Type 2 diabetes mellitus without complications: Secondary | ICD-10-CM

## 2012-03-02 LAB — POTASSIUM: Potassium: 4.6 mEq/L (ref 3.5–5.1)

## 2012-03-02 NOTE — Progress Notes (Signed)
Subjective:    Patient ID: Kaitlin Henderson, female    DOB: Jan 12, 1941, 71 y.o.   MRN: [id number]  HPI  71 y.o.WF copd Golds D   02/05/2012 Pt in hosp end of May with copd exac 5/17- 5/28 . [facility].  Making some progress. Notes less labored breathing.  No chest pain.  Pt will cough up thick pearls of mucus. No real edema in the feet.  Participating in PT , walking , elastic band, bounce balls.  Pt went to the kitchen for dishes.      03/02/2012 Hospice seeing the pt now.  Not much cough.  No real wheeze.  Dyspnea is better. Pt denies any significant sore throat, nasal congestion or excess secretions, fever, chills, sweats, unintended weight loss, pleurtic or exertional chest pain, orthopnea PND, or leg swelling Pt denies any increase in rescue therapy over baseline, denies waking up needing it or having any early am or nocturnal exacerbations of coughing/wheezing/or dyspnea. Pt also denies any obvious fluctuation in symptoms with  weather or environmental change or other alleviating or aggravating factors        Past Medical History  Diagnosis Date  . Breast cancer, right breast 1998    s/p chemo & XRT, right mastectomy  . OBESITY   . CORONARY ARTERY DISEASE   . APHTHOUS STOMATITIS   . DEPRESSION     started sertraline 09/2010  . DIABETES, TYPE 2 dx 03/2010  . HYPERLIPIDEMIA   . Hypothyroidism   . C O P D     chronic O2 3LPM [place]  . Essential tremor   . NSTEMI (non-ST elevated myocardial infarction) 01/2012    med mgmt     Family History  Problem Relation Age of Onset  . Diabetes Neg Hx   . Cancer Neg Hx      History   Social History  . Marital Status: Widowed    Spouse Name: N/A    Number of Children: N/A  . Years of Education: N/A   Occupational History  . Not on file.   Social History Main Topics  . Smoking status: Former Smoker -- 2.5 packs/day for 54 years    Types: Cigarettes    Quit date: 09/02/2006  . Smokeless tobacco: Never Used     Comment: Retired. Widow/widower since 2008-lives alone. Home hospice since 06/2009 related to COPD  . Alcohol Use: No  . Drug Use: No  . Sexually Active: Not on file   Other Topics Concern  . Not on file   Social History Narrative  . No narrative on file     Allergies  Allergen Reactions  . Codeine     REACTION: makes pt pass out  . Levaquin (Levofloxacin In D5w) Other (See Comments)    redness  . Sulfonamide Derivatives     REACTION: edema     Outpatient Prescriptions Prior to Visit  Medication Sig Dispense Refill  . ALPRAZolam (XANAX) 0.5 MG tablet Take 1 tablet (0.5 mg total) by mouth 2 (two) times daily.  30 tablet  0  . aspirin 81 MG tablet Take 81 mg by mouth daily.        . benzonatate (TESSALON) 100 MG capsule Take 100 mg by mouth every 8 (eight) hours as needed. For cough      . esomeprazole (NEXIUM) 40 MG capsule Take 40 mg by mouth daily before breakfast.      . furosemide (LASIX) 40 MG tablet Take 1 tablet (  40 mg total) by mouth 2 (two) times daily.  30 tablet    . ONE TOUCH ULTRA TEST test strip CHECK BLOOD SUGAR TWO TIMES A DAY DX: 250.00  50 each  3  . pravastatin (PRAVACHOL) 40 MG tablet Take 40 mg by mouth daily.      [name] albuterol (PROVENTIL) (5 MG/ML) 0.5% nebulizer solution Take 0.5 mLs (2.5 mg total) by nebulization 4 (four) times daily.  20 mL    . budesonide (PULMICORT) 0.5 MG/2ML nebulizer solution Take 2 mLs (0.5 mg total) by nebulization 2 (two) times daily.  120 mL  11  . ipratropium (ATROVENT) 0.02 % nebulizer solution Take 2.5 mLs (0.5 mg total) by nebulization 4 (four) times daily.  75 mL    . Ascorbic Acid (VITAMIN C) 500 MG tablet Take 500 mg by mouth daily.        [name] guaiFENesin (MUCINEX) 600 MG 12 hr tablet Take 2 tablets (1,200 mg total) by mouth 2 (two) times daily. X 10days      . Multiple Vitamins-Minerals (CENTRUM SILVER) tablet Take 1 tablet by mouth daily.        . Omega-3 Fatty Acids (FISH OIL) 1200 MG CAPS Take by mouth 2 (two) times  daily.        . sennosides-docusate sodium (SENOKOT-S) 8.6-50 MG tablet Take 1 tablet by mouth at bedtime as needed. For constipation      . vitamin E 400 UNIT capsule Take 400 Units by mouth 2 (two) times daily.          Review of Systems  Constitutional:   No  weight loss, night sweats,  Fevers, chills, fatigue, lassitude. HEENT:   No headaches,  Difficulty swallowing,  Tooth/dental problems,  Sore throat,                No sneezing, itching, ear ache, nasal congestion, post nasal drip,   CV:  No chest pain,  Orthopnea, PND, swelling in lower extremities, anasarca, dizziness, palpitations  GI  No heartburn, indigestion, abdominal pain, nausea, vomiting, diarrhea, change in bowel habits, loss of appetite  Resp: Notes  shortness of breath with exertion not  at rest.  No excess mucus, no productive cough,  No non-productive cough,  No coughing up of blood.  No change in color of mucus.  No wheezing.  No chest wall deformity  Skin: no rash or lesions.  GU: no dysuria, change in color of urine, no urgency or frequency.  No flank pain.  MS:  No joint pain or swelling.  No decreased range of motion.  No back pain.  Psych:  No change in mood or affect. No depression or anxiety.  No memory loss. t    Objective:   Physical Exam  Filed Vitals:   03/02/12 1044 03/02/12 1049  BP:  112/68  Pulse:  93  Temp:  98.4 F (36.9 C)  TempSrc:  Oral  Height:  5' 2.5" (1.588 m)  Weight:  173 lb 12.8 oz (78.835 kg)  SpO2: 89% 94%    Gen: Pleasant, well-nourished, in no distress,  normal affect  ENT: No lesions,  mouth clear,  oropharynx clear, no postnasal drip  Neck: No JVD, no TMG, no carotid bruits  Lungs: No use of accessory muscles, no dullness to percussion, distant BS   Cardiovascular: RRR, heart sounds normal, no murmur or gallops, no peripheral edema  Abdomen: soft and NT, no HSM,  BS normal  Musculoskeletal: No deformities, no cyanosis or clubbing  Neuro: alert, non  focal  Skin: Warm, no lesions or rashes   PFT Conversion 12/06/2008  FVC 1.95  FVC PREDICT 2.76  FVC  % Predicted 71  FEV1 1.01  FEV1 PREDICT 1.97  FEV % Predicted 51  FEV1/FVC 51.8  FEV1/FVC PRE 72  FEV1/FVC%EXP   FeF 25-75 0.31  FeF 25-75 % Predicted 2.29  FEF % EXPEC 13  POST FVC 2.08  FVCPRDPST 2.76  POST FVC%EXP 75  POST FEV1 1.16  FEV1PRDPST 1.97  POSTFEV1%PRD 59  PSTFEV1/FVC 56  FEV1FVCPRDPS 72  PSTFEF25/75% 0.46  PSTFEF25/75P 2.29  FEF2575%EXPS 20  Residual Volume (ml) 2.86  RV % EXPECT 156  DLCO (ml/mmHg sec) 12.8  DLCO % EXPEC 53  DLCO/VA 4.35  DLCO/VA%EXP 122  PFT RSLT severe obstruction with significant bronchodilator response   PFT Conversion 09/25/2010  FVC   FVC PREDICT 2.755  FVC  % Predicted 54.77  FEV1   FEV1 PREDICT 2.081  FEV % Predicted 42.52  FEV1/FVC   FEV1/FVC PRE 75.934  FEV1/FVC%EXP 77.220  FeF 25-75   FeF 25-75 % Predicted   FEF % EXPEC 27.860  POST FVC   FVCPRDPST   POST FVC%EXP   POST FEV1   FEV1PRDPST   POSTFEV1%PRD   PSTFEV1/FVC   FEV1FVCPRDPS   PSTFEF25/75%   PSTFEF25/75P   FEF2575%EXPS   Residual Volume (ml)   RV % EXPECT   DLCO (ml/mmHg sec)   DLCO % EXPEC   DLCO/VA   DLCO/VA%EXP   PFT RSLT    Assessment & Plan:   C O P D Severe COPD  Golds Stage D [facility]   following as outpatient  No changes indicated No change in inhaled or maintenance medications. Return in 3 months    Updated Medication List Outpatient Encounter Prescriptions as of 03/02/2012  Medication Sig Dispense Refill  . albuterol (PROVENTIL) (5 MG/ML) 0.5% nebulizer solution Take 2.5 mg by nebulization 3 (three) times daily.      [name] ALPRAZolam (XANAX) 0.5 MG tablet Take 1 tablet (0.5 mg total) by mouth 2 (two) times daily.  30 tablet  0  . aspirin 81 MG tablet Take 81 mg by mouth daily.        . benzonatate (TESSALON) 100 MG capsule Take 100 mg by mouth every 8 (eight) hours as needed. For cough      . budesonide (PULMICORT) 0.5 MG/2ML  nebulizer solution Take 0.5 mg by nebulization daily.      [name] esomeprazole (NEXIUM) 40 MG capsule Take 40 mg by mouth daily before breakfast.      . furosemide (LASIX) 40 MG tablet Take 1 tablet (40 mg total) by mouth 2 (two) times daily.  30 tablet    . glimepiride (AMARYL) 2 MG tablet Take 1 tablet (2 mg total) by mouth daily before breakfast.      . ipratropium (ATROVENT) 0.02 % nebulizer solution Take 0.5 mg by nebulization 3 (three) times daily.      . ONE TOUCH ULTRA TEST test strip CHECK BLOOD SUGAR TWO TIMES A DAY DX: 250.00  50 each  3  . potassium chloride SA (KLOR-CON M20) 20 MEQ tablet Take 2 tablets (40 mEq total) by mouth daily.  60 tablet  2  . pravastatin (PRAVACHOL) 40 MG tablet Take 40 mg by mouth daily.      [name] DISCONTD: albuterol (PROVENTIL) (5 MG/ML) 0.5% nebulizer solution Take 0.5 mLs (2.5 mg total) by nebulization 4 (four) times daily.  20 mL    . DISCONTD:  budesonide (PULMICORT) 0.5 MG/2ML nebulizer solution Take 2 mLs (0.5 mg total) by nebulization 2 (two) times daily.  120 mL  11  . DISCONTD: ipratropium (ATROVENT) 0.02 % nebulizer solution Take 2.5 mLs (0.5 mg total) by nebulization 4 (four) times daily.  75 mL    . DISCONTD: Ascorbic Acid (VITAMIN C) 500 MG tablet Take 500 mg by mouth daily.        [name] DISCONTD: guaiFENesin (MUCINEX) 600 MG 12 hr tablet Take 2 tablets (1,200 mg total) by mouth 2 (two) times daily. X 10days      . DISCONTD: Multiple Vitamins-Minerals (CENTRUM SILVER) tablet Take 1 tablet by mouth daily.        [name] DISCONTD: Omega-3 Fatty Acids (FISH OIL) 1200 MG CAPS Take by mouth 2 (two) times daily.        [name] DISCONTD: sennosides-docusate sodium (SENOKOT-S) 8.6-50 MG tablet Take 1 tablet by mouth at bedtime as needed. For constipation      . DISCONTD: vitamin E 400 UNIT capsule Take 400 Units by mouth 2 (two) times daily.

## 2012-03-02 NOTE — Assessment & Plan Note (Signed)
GOLD stage D - advanced dz Recent hosp 01/2012 complicated by NSTEMI (med mgmt) Now back to baseline Chronic pred/O2 Continue nebs Home hospice following for same Mgmt as per pulm (wright)

## 2012-03-02 NOTE — Progress Notes (Signed)
Subjective:    Patient ID: Kaitlin Henderson, female    DOB: December 16, 1940, 71 y.o.   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 denies adverse side effects related to current therapy. no hypoglycemic events or symptoms - checks sugars 2x/day - home cbg log reviewed - range 101-170s, lowest in AM   COPD - advanced dz - follows with pulm regularly; previously enrolled with hospice for same since 06/2009 through summer 2012; resumed hospice 01/2012 -  chronic pred + cont O2 use, inhaler changes fall 2012 with slightly improved symptoms - daily dyspnea on exertion but no cough or flares at this time - reports compliance with ongoing medical treatment and no changes in medication dose or frequency. denies adverse side effects related to current therapy.   breast cancer history. >48yr out - dx 1998 s/p chemo and xrt - s/p r mastect for same -   Anxiety/depression- largely related to trouble breathing and fear of being alone - "i have no one" since mom & spouse expired and children are "distantly involved" per her report - uses BZ as needed - started SSRI 09/2010- then mom passed 01/2011 -still "feels sad", but denies SI/HI  - not interesting in resuming SSRI at this time   Past Medical History  Diagnosis Date  . Breast cancer, right breast 1998    s/p chemo & XRT, right mastectomy  . OBESITY   . CORONARY  ARTERY DISEASE   . APHTHOUS STOMATITIS   . DEPRESSION     started sertraline 09/2010  . DIABETES, TYPE 2 dx 03/2010  . HYPERLIPIDEMIA   . Hypothyroidism   . C O P D     chronic O2 3LPM [place]  . Essential tremor   . NSTEMI (non-ST elevated myocardial infarction) 01/2012    med mgmt     Review of Systems  Constitutional: Positive for fatigue. Negative for fever.  Cardiovascular: Negative for chest pain.  Genitourinary: Negative for dysuria and frequency.  Neurological: Negative for headaches.       Objective:   Physical Exam BP 140/72  Pulse 91  Temp 98.4 F (36.9 C) (Oral)  Ht 5\' 2"  (1.575 m)  Wt 164 lb 12.8 oz (74.753 kg)  BMI 30.14 kg/m2  SpO2 96% Wt Readings from Last 3 Encounters:  03/02/12 164 lb 12.8 oz (74.753 kg)  02/05/12 181 lb (82.101 kg)  01/28/12 179 lb 14.3 oz (81.6 kg)   Constitutional: She is obese; appears well-developed and well-nourished. No distress. friend Karena Addison at side Neck: Normal range of motion. Neck supple. No JVD present. No thyromegaly present.  Cardiovascular: Normal rate, regular rhythm and normal heart sounds.  No murmur heard. No BLE edema. Pulmonary/Chest: Effort normal at rest: breath sounds diminished at bases. She has no wheezes.  Skin: Pale, chronically - but skin is warm and dry. No rash noted. No erythema.  Neuro: R hand active tremor without change Psychiatric: She has a dysphoric  mood and affect, mild anxiety. Appropriately despondent about sad events in her past. Her behavior is normal. Judgment and thought content normal.      Lab Results  Component Value Date   WBC 13.3* 01/27/2012   HGB 14.9 01/27/2012   HCT 43.8 01/27/2012   PLT 285 01/27/2012   CHOL 209* 01/19/2012   TRIG 154* 01/19/2012   HDL 43 01/19/2012   ALT 23 12/09/8117   AST 22 09/25/2010   NA 137 01/28/2012   K 3.2* 01/28/2012   CL 96 01/28/2012   CREATININE 1.02 01/28/2012   BUN 24* 01/28/2012   CO2 33* 01/28/2012   TSH 0.60 07/29/2011   HGBA1C 6.5* 01/19/2012    MICROALBUR 0.3 11/25/2011   Assessment & Plan:   See problem list. Medications and labs reviewed today.  Hypokalemia - recheck today

## 2012-03-02 NOTE — Patient Instructions (Addendum)
No change in medications. Return in        2 months 

## 2012-03-02 NOTE — Patient Instructions (Signed)
It was good to see you today. We have reviewed your hospital records including labs and tests today Test(s) ordered today. Your results will be called to you after review (48-72hours after test completion). If any changes need to be made, you will be notified at that time. Medications reviewed, no changes at this time Please schedule followup in 3 months for diabetes, call sooner if problems.

## 2012-03-02 NOTE — Assessment & Plan Note (Signed)
The current medical regimen is effective;  continue present plan and medications.  Metformin daily with prn amaryl for cbg >150 - no change in steroid dose Home cbg 95-150s - reasonable control for state of other health issues Also on ASA 81, statin -  Lab Results  Component Value Date   HGBA1C 6.5* 01/19/2012   

## 2012-03-02 NOTE — Assessment & Plan Note (Signed)
Was improved with addition of SSRI 10-19-10 - but worse following death of mom 02-17-2011 -  exacerbated by recent hospitalization and acute illness Uses BZ prn to control anxiety overlap - Continue present plan and medications.

## 2012-03-02 NOTE — Assessment & Plan Note (Signed)
Severe COPD  [place] Stage D [facility]   following as outpatient  No changes indicated No change in inhaled or maintenance medications. Return in 3 months

## 2012-03-03 ENCOUNTER — Telehealth: Payer: Self-pay | Admitting: Internal Medicine

## 2012-03-03 NOTE — Telephone Encounter (Signed)
Caller: [place]/Patient is calling with a question about Glymepipride.The medication was written by [name]. Caller unsure of instructions. Advised per [facility], Glymipride ordered: glimepiride (AMARYL) 2 MG tablet  (Taking/Discontinued)       03/02/2012  Sig - Route:  Take 1 mg by mouth 2 (two) times daily. Take 1/2 by mouth every morning if cbg > 150 - Oral. Reviewed same with pt.

## 2012-03-04 ENCOUNTER — Telehealth: Payer: Self-pay | Admitting: Critical Care Medicine

## 2012-03-04 NOTE — Telephone Encounter (Signed)
Stay on lasix at current dose for now

## 2012-03-04 NOTE — Telephone Encounter (Signed)
Pt aware. Jennifer Castillo, CMA  

## 2012-03-04 NOTE — Telephone Encounter (Signed)
I spoke with the pt and she states when she saw Dr. Delford Field on Monday they were talking about her lasix and fluid and he stated that maybe they were getting too much fluid off of her, but then she states they got distracted and started talking about something else and never finished the conversation. The pt is currently taking 80mg  lasix daily. She denies any swelling in extremities of increased SOB. Pt just wanted to know if there was more that Dr. Delford Field was going to say about this subject? She wanted to know if he was going to change her lasix dosage. Please advise. Carron Curie, CMA

## 2012-03-10 ENCOUNTER — Other Ambulatory Visit: Payer: Self-pay | Admitting: Internal Medicine

## 2012-03-13 ENCOUNTER — Ambulatory Visit: Payer: Medicare Other | Admitting: Critical Care Medicine

## 2012-03-25 ENCOUNTER — Ambulatory Visit: Payer: Medicare Other | Admitting: Internal Medicine

## 2012-04-07 ENCOUNTER — Other Ambulatory Visit: Payer: Self-pay | Admitting: Critical Care Medicine

## 2012-04-07 ENCOUNTER — Other Ambulatory Visit: Payer: Self-pay | Admitting: Internal Medicine

## 2012-06-08 ENCOUNTER — Other Ambulatory Visit (INDEPENDENT_AMBULATORY_CARE_PROVIDER_SITE_OTHER): Payer: Medicare Other

## 2012-06-08 ENCOUNTER — Ambulatory Visit (INDEPENDENT_AMBULATORY_CARE_PROVIDER_SITE_OTHER): Payer: Medicare Other | Admitting: Critical Care Medicine

## 2012-06-08 ENCOUNTER — Encounter: Payer: Self-pay | Admitting: Critical Care Medicine

## 2012-06-08 ENCOUNTER — Ambulatory Visit (INDEPENDENT_AMBULATORY_CARE_PROVIDER_SITE_OTHER): Payer: Medicare Other | Admitting: Internal Medicine

## 2012-06-08 ENCOUNTER — Encounter: Payer: Self-pay | Admitting: Internal Medicine

## 2012-06-08 VITALS — BP 118/66 | HR 83 | Temp 98.3°F | Ht 62.5 in | Wt 175.0 lb

## 2012-06-08 VITALS — BP 118/72 | HR 77 | Temp 98.1°F | Ht 62.5 in | Wt 175.0 lb

## 2012-06-08 DIAGNOSIS — L659 Nonscarring hair loss, unspecified: Secondary | ICD-10-CM

## 2012-06-08 DIAGNOSIS — F329 Major depressive disorder, single episode, unspecified: Secondary | ICD-10-CM

## 2012-06-08 DIAGNOSIS — E119 Type 2 diabetes mellitus without complications: Secondary | ICD-10-CM

## 2012-06-08 DIAGNOSIS — J449 Chronic obstructive pulmonary disease, unspecified: Secondary | ICD-10-CM

## 2012-06-08 DIAGNOSIS — Z23 Encounter for immunization: Secondary | ICD-10-CM

## 2012-06-08 DIAGNOSIS — J9610 Chronic respiratory failure, unspecified whether with hypoxia or hypercapnia: Secondary | ICD-10-CM

## 2012-06-08 LAB — HEMOGLOBIN A1C: Hgb A1c MFr Bld: 5.9 % (ref 4.6–6.5)

## 2012-06-08 MED ORDER — ALBUTEROL SULFATE HFA 108 (90 BASE) MCG/ACT IN AERS: 2.0000 | INHALATION_SPRAY | Freq: Four times a day (QID) | RESPIRATORY_TRACT | Status: DC | PRN

## 2012-06-08 NOTE — Progress Notes (Signed)
Subjective:    Patient ID: Kaitlin Henderson, female    DOB: [birth year], 71 y.o.   MRN: [id number]  HPI  71 y.o.WF copd G[place] D   02/05/2012 Pt in hosp end of May with copd exac 5/17- 5/28 . [facility] is [facility] with [facility].  Making some progress. Notes less labored breathing.  No chest pain.  Pt will cough up thick pearls of mucus. No real edema in the feet.  Participating in PT , walking , elastic band, bounce balls.  Pt went to the kitchen for dishes.      03/02/2012 Hospice seeing the pt now.  Not much cough.  No real wheeze.  Dyspnea is better. Pt denies any significant sore throat, nasal congestion or excess secretions, fever, chills, sweats, unintended weight loss, pleurtic or exertional chest pain, orthopnea PND, or leg swelling Pt denies any increase in rescue therapy over baseline, denies waking up needing it or having any early am or nocturnal exacerbations of coughing/wheezing/or dyspnea. Pt also denies any obvious fluctuation in symptoms with  weather or environmental change or other alleviating or aggravating factors    06/08/2012 Since 7/13 seems slightly worse.  Now in Hospice.  Pt wants to be on inhalers albuterol in the purse. Pt denies any significant sore throat, nasal congestion or excess secretions, fever, chills, sweats, unintended weight loss, pleurtic or exertional chest pain, orthopnea PND, or leg swelling Pt denies any increase in rescue therapy over baseline, denies waking up needing it or having any early am or nocturnal exacerbations of coughing/wheezing/or dyspnea. Pt also denies any obvious fluctuation in symptoms with  weather or environmental change or other alleviating or aggravating factors      Past Medical History  Diagnosis Date  . Breast cancer, right breast 1998    s/p chemo & XRT, right mastectomy  . OBESITY   . CORONARY ARTERY DISEASE   . APHTHOUS STOMATITIS   . DEPRESSION     started sertraline 09/2010  . DIABETES, TYPE 2 dx 03/2010  .  HYPERLIPIDEMIA   . Hypothyroidism   . C O P D     chronic O2 3LPM [place]  . Essential tremor   . NSTEMI (non-ST elevated myocardial infarction) 01/2012    med mgmt     Family History  Problem Relation Age of Onset  . Diabetes Neg Hx   . Cancer Neg Hx      History   Social History  . Marital Status: Widowed    Spouse Name: N/A    Number of Children: N/A  . Years of Education: N/A   Occupational History  . Not on file.   Social History Main Topics  . Smoking status: Former Smoker -- 2.5 packs/day for 54 years    Types: Cigarettes    Quit date: 09/02/2006  . Smokeless tobacco: Never Used   Comment: Retired. Widow/widower since 2008-lives alone. Home hospice since 06/2009 related to COPD  . Alcohol Use: No  . Drug Use: No  . Sexually Active: Not on file   Other Topics Concern  . Not on file   Social History Narrative  . No narrative on file     Allergies  Allergen Reactions  . Codeine     REACTION: makes pt pass out  . Levaquin (Levofloxacin In D5w) Other (See Comments)    redness  . Sulfonamide Derivatives     REACTION: edema     Outpatient Prescriptions Prior to Visit  Medication Sig Dispense Refill  .  albuterol (PROVENTIL) (5 MG/ML) 0.5% nebulizer solution Take 2.5 mg by nebulization 3 (three) times daily.      [name] aspirin 81 MG tablet Take 81 mg by mouth daily.        . benzonatate (TESSALON) 100 MG capsule Take 100 mg by mouth every 8 (eight) hours as needed. For cough      . esomeprazole (NEXIUM) 40 MG capsule Take 40 mg by mouth daily before breakfast.      . furosemide (LASIX) 40 MG tablet TAKE 1 TABLET TWICE DAILY  60 tablet  5  . glimepiride (AMARYL) 1 MG tablet TAKE 1 TABLET TWICE DAILY  60 tablet  5  . KLOR-CON M20 20 MEQ tablet TAKE 1 TABLET TWICE DAILY.  60 tablet  2  . ONE TOUCH ULTRA TEST test strip CHECK BLOOD SUGAR TWO TIMES A DAY DX: 250.00  50 each  3  . pravastatin (PRAVACHOL) 40 MG tablet TAKE 1 TABLET BY MOUTH ONCE DAILY  30 tablet  5  .  ALPRAZolam (XANAX) 0.5 MG tablet Take 1 tablet (0.5 mg total) by mouth 2 (two) times daily.  30 tablet  0  . furosemide (LASIX) 40 MG tablet Take 1 tablet (40 mg total) by mouth 2 (two) times daily.  30 tablet    . potassium chloride SA (KLOR-CON M20) 20 MEQ tablet Take 2 tablets (40 mEq total) by mouth daily.  60 tablet  2  . pravastatin (PRAVACHOL) 40 MG tablet Take 40 mg by mouth daily.      [name] ipratropium (ATROVENT) 0.02 % nebulizer solution Take 0.5 mg by nebulization 3 (three) times daily.      . budesonide (PULMICORT) 0.5 MG/2ML nebulizer solution Take 0.5 mg by nebulization daily.         Review of Systems  Constitutional:   No  weight loss, night sweats,  Fevers, chills, fatigue, lassitude. HEENT:   No headaches,  Difficulty swallowing,  Tooth/dental problems,  Sore throat,                No sneezing, itching, ear ache, nasal congestion, post nasal drip,   CV:  No chest pain,  Orthopnea, PND, swelling in lower extremities, anasarca, dizziness, palpitations  GI  No heartburn, indigestion, abdominal pain, nausea, vomiting, diarrhea, change in bowel habits, loss of appetite  Resp: Notes  shortness of breath with exertion not  at rest.  No excess mucus, no productive cough,  No non-productive cough,  No coughing up of blood.  No change in color of mucus.  No wheezing.  No chest wall deformity  Skin: no rash or lesions.  GU: no dysuria, change in color of urine, no urgency or frequency.  No flank pain.  MS:  No joint pain or swelling.  No decreased range of motion.  No back pain.  Psych:  No change in mood or affect. No depression or anxiety.  No memory loss. t    Objective:   Physical Exam  Filed Vitals:   06/08/12 1218  BP: 118/72  Pulse: 77  Temp: 98.1 F (36.7 C)  TempSrc: Oral  Height: 5' 2.5" (1.588 m)  Weight: 175 lb (79.379 kg)  SpO2: 96%    Gen: Pleasant, well-nourished, in no distress,  normal affect  ENT: No lesions,  mouth clear,  oropharynx clear, no  postnasal drip  Neck: No JVD, no TMG, no carotid bruits  Lungs: No use of accessory muscles, no dullness to percussion, distant BS   Cardiovascular: RRR, heart  sounds normal, no murmur or gallops, no peripheral edema  Abdomen: soft and NT, no HSM,  BS normal  Musculoskeletal: No deformities, no cyanosis or clubbing  Neuro: alert, non focal  Skin: Warm, no lesions or rashes   PFT Conversion 12/06/2008  FVC 1.95  FVC PREDICT 2.76  FVC  % Predicted 71  FEV1 1.01  FEV1 PREDICT 1.97  FEV % Predicted 51  FEV1/FVC 51.8  FEV1/FVC PRE 72  FEV1/FVC%EXP   FeF 25-75 0.31  FeF 25-75 % Predicted 2.29  FEF % EXPEC 13  POST FVC 2.08  FVCPRDPST 2.76  POST FVC%EXP 75  POST FEV1 1.16  FEV1PRDPST 1.97  POSTFEV1%PRD 59  PSTFEV1/FVC 56  FEV1FVCPRDPS 72  PSTFEF25/75% 0.46  PSTFEF25/75P 2.29  FEF2575%EXPS 20  Residual Volume (ml) 2.86  RV % EXPECT 156  DLCO (ml/mmHg sec) 12.8  DLCO % EXPEC 53  DLCO/VA 4.35  DLCO/VA%EXP 122  PFT RSLT severe obstruction with significant bronchodilator response   PFT Conversion 09/25/2010  FVC   FVC PREDICT 2.755  FVC  % Predicted 54.77  FEV1   FEV1 PREDICT 2.081  FEV % Predicted 42.52  FEV1/FVC   FEV1/FVC PRE 75.934  FEV1/FVC%EXP 77.220  FeF 25-75   FeF 25-75 % Predicted   FEF % EXPEC 27.860  POST FVC   FVCPRDPST   POST FVC%EXP   POST FEV1   FEV1PRDPST   POSTFEV1%PRD   PSTFEV1/FVC   FEV1FVCPRDPS   PSTFEF25/75%   PSTFEF25/75P   FEF2575%EXPS   Residual Volume (ml)   RV % EXPECT   DLCO (ml/mmHg sec)   DLCO % EXPEC   DLCO/VA   DLCO/VA%EXP   PFT RSLT    Assessment & Plan:   C O P D Gold D Severe COPD gold stage D. oxygen dependent but stable at this time Plan Stop Budesonide Stay on albuterol and ipratroprium in nebulizer Stay on oxygen Flu vaccine today Return 3 months     Updated Medication List Outpatient Encounter Prescriptions as of 06/08/2012  Medication Sig Dispense Refill  . albuterol (PROVENTIL) (5 MG/ML) 0.5%  nebulizer solution Take 2.5 mg by nebulization 3 (three) times daily.      [name] ALPRAZolam (XANAX) 0.5 MG tablet Take 0.5 mg by mouth 2 (two) times daily as needed.      [name] aspirin 81 MG tablet Take 81 mg by mouth daily.        . benzonatate (TESSALON) 100 MG capsule Take 100 mg by mouth every 8 (eight) hours as needed. For cough      . esomeprazole (NEXIUM) 40 MG capsule Take 40 mg by mouth daily before breakfast.      . furosemide (LASIX) 40 MG tablet TAKE 1 TABLET TWICE DAILY  60 tablet  5  . glimepiride (AMARYL) 1 MG tablet TAKE 1 TABLET TWICE DAILY  60 tablet  5  . KLOR-CON M20 20 MEQ tablet TAKE 1 TABLET TWICE DAILY.  60 tablet  2  . ONE TOUCH ULTRA TEST test strip CHECK BLOOD SUGAR TWO TIMES A DAY DX: 250.00  50 each  3  . pravastatin (PRAVACHOL) 40 MG tablet TAKE 1 TABLET BY MOUTH ONCE DAILY  30 tablet  5  . DISCONTD: ALPRAZolam (XANAX) 0.5 MG tablet Take 1 tablet (0.5 mg total) by mouth 2 (two) times daily.  30 tablet  0  . DISCONTD: furosemide (LASIX) 40 MG tablet Take 1 tablet (40 mg total) by mouth 2 (two) times daily.  30 tablet    . DISCONTD: potassium  chloride SA (KLOR-CON M20) 20 MEQ tablet Take 2 tablets (40 mEq total) by mouth daily.  60 tablet  2  . DISCONTD: pravastatin (PRAVACHOL) 40 MG tablet Take 40 mg by mouth daily.      [name] albuterol (PROVENTIL HFA;VENTOLIN HFA) 108 (90 BASE) MCG/ACT inhaler Inhale 2 puffs into the lungs every 6 (six) hours as needed for wheezing.  1 Inhaler  11  . ipratropium (ATROVENT) 0.02 % nebulizer solution Take 0.5 mg by nebulization 3 (three) times daily.      [name] DISCONTD: budesonide (PULMICORT) 0.5 MG/2ML nebulizer solution Take 0.5 mg by nebulization daily.

## 2012-06-08 NOTE — Assessment & Plan Note (Signed)
GOLD stage D - advanced dz s/p hosp 01/2012 with PNA, complicated by NSTEMI (med mgmt) Now back to baseline Chronic pred/O2 Continue nebs Home hospice following for same Mgmt as per pulm ([place])

## 2012-06-08 NOTE — Patient Instructions (Signed)
It was good to see you today. We have reviewed your prior records including labs and tests today Test(s) ordered today. Your results will be called to you after review (48-72hours after test completion). If any changes need to be made, you will be notified at that time. Medications reviewed, no changes at this time Please schedule followup in 4 months for diabetes, call sooner if problems.

## 2012-06-08 NOTE — Assessment & Plan Note (Signed)
Was improved with addition of SSRI 10-11-2010 - but worse following death of mom 02/09/2011 -  exacerbated by hospitalization and acute illness summer 2013 Uses BZ prn to control anxiety overlap -declines to resume SSI Continue present plan and medications. Support offered

## 2012-06-08 NOTE — Assessment & Plan Note (Signed)
Severe COPD gold stage D. oxygen dependent but stable at this time Plan Stop Budesonide Stay on albuterol and ipratroprium in nebulizer Stay on oxygen Flu vaccine today Return 3 months

## 2012-06-08 NOTE — Progress Notes (Signed)
Subjective:    Patient ID: Kaitlin Henderson, female    DOB: [birth year], 71 y.o.   MRN: [id number]  HPI  Here for follow up - reviewed chronic medical issues:  DM2 - dx 03/2010 by routine pulm labs; started on metformin + glimepiride 03/2010; reports compliance with ongoing medical treatment and no changes in medication dose or frequency. denies adverse side effects related to current therapy. no hypoglycemic events or symptoms - checks sugars 2x/day - home cbg log reviewed - range 101-170s, lowest in AM   COPD - advanced dz, O2-dep; hospitalization summer 2013 with PNA - follows with pulm regularly; previously enrolled with hospice for same since 06/2009 through summer 2012; resumed hospice 01/2012 -  chronic pred + cont O2 use, inhaler changes fall 2012 with slightly improved symptoms - daily dyspnea on exertion but no cough or flares at this time - reports compliance with ongoing medical treatment and no changes in medication dose or frequency. denies adverse side effects related to current therapy.   breast cancer history. >14yr out - dx 1998 s/p chemo and xrt - s/p r mastect for same -   Anxiety/depression- largely related to trouble breathing and fear of being alone - "i have no one" since mom & spouse expired and children are "distantly involved" - uses BZ as needed - started SSRI 09/2010- then mom passed 01/2011, so she stopped medication - denies SI/HI  - not interesting in resuming SSRI at this time   Past Medical History  Diagnosis Date  . Breast cancer, right breast 1998    s/p chemo & XRT, right mastectomy  . OBESITY   . CORONARY ARTERY DISEASE   . APHTHOUS STOMATITIS   . DEPRESSION     started sertraline 09/2010  . DIABETES, TYPE 2 dx 03/2010  . HYPERLIPIDEMIA   . Hypothyroidism   . C O P D     chronic O2 3LPM [place]  . Essential tremor   . NSTEMI (non-ST elevated myocardial infarction) 01/2012    med mgmt     Review of Systems  Constitutional: Positive for fatigue. Negative  for fever.  Cardiovascular: Negative for chest pain.  Genitourinary: Negative for dysuria and frequency.  Neurological: Negative for headaches.       Objective:   Physical Exam BP 118/66  Pulse 83  Temp 98.3 F (36.8 C) (Oral)  Ht 5' 2.5" (1.588 m)  Wt 175 lb (79.379 kg)  BMI 31.50 kg/m2  SpO2 96% Wt Readings from Last 3 Encounters:  06/08/12 175 lb (79.379 kg)  03/02/12 173 lb 12.8 oz (78.835 kg)  03/02/12 164 lb 12.8 oz (74.753 kg)   Constitutional: She is obese, but appears well-developed and well-nourished. No distress. friend Karena Addison at side Neck: Normal range of motion. Neck supple. No JVD present. No thyromegaly present.  Cardiovascular: Normal rate, regular rhythm and normal heart sounds.  No murmur heard. No BLE edema. Pulmonary/Chest: Effort normal at rest: breath sounds diminished at bases. She has no wheezes.  Skin: thinning hair but no alopecia - chronically pale but without change -  skin is warm and dry. No rash noted. No erythema.  Neuro: R hand active tremor without change Psychiatric: She has a dysphoric mood with overlapping mild anxiety. despondent about sad events in her past. Her behavior is normal. Judgment and thought content normal.      Lab Results  Component Value Date   WBC 13.3* 01/27/2012   HGB 14.9 01/27/2012   HCT 43.8 01/27/2012  PLT 285 01/27/2012   CHOL 209* 01/19/2012   TRIG 154* 01/19/2012   HDL 43 01/19/2012   ALT 23 1/61/0960   AST 22 09/25/2010   NA 137 01/28/2012   K 4.6 03/02/2012   CL 96 01/28/2012   CREATININE 1.02 01/28/2012   BUN 24* 01/28/2012   CO2 33* 01/28/2012   TSH 0.60 07/29/2011   HGBA1C 6.5* 01/19/2012   MICROALBUR 0.3 11/25/2011   Assessment & Plan:   See problem list. Medications and labs reviewed today.  Hair loss - no alopecia on exam - check TSH

## 2012-06-08 NOTE — Patient Instructions (Addendum)
Stop Budesonide Stay on albuterol and ipratroprium in nebulizer Stay on oxygen Flu vaccine today Return 3 months

## 2012-06-08 NOTE — Assessment & Plan Note (Signed)
The current medical regimen is effective;  continue present plan and medications.  Metformin daily with prn amaryl for cbg >150 - no change in steroid dose Home cbg 95-150s - reasonable control for state of other health issues Also on ASA 81, statin -  Lab Results  Component Value Date   HGBA1C 6.5* 01/19/2012

## 2012-06-09 ENCOUNTER — Telehealth: Payer: Self-pay | Admitting: Internal Medicine

## 2012-06-09 NOTE — Telephone Encounter (Signed)
Pt informed of lab results. 

## 2012-06-09 NOTE — Telephone Encounter (Signed)
The pt called hoping to get her test results. I informed her she would be called as soon as they were reviewed, however, she stated she was told they would be ready this morning.  Her callback number is [phone].

## 2012-06-29 ENCOUNTER — Other Ambulatory Visit: Payer: Self-pay | Admitting: Critical Care Medicine

## 2012-06-29 ENCOUNTER — Other Ambulatory Visit: Payer: Self-pay | Admitting: Internal Medicine

## 2012-08-03 ENCOUNTER — Encounter: Payer: Self-pay | Admitting: Adult Health

## 2012-08-03 ENCOUNTER — Ambulatory Visit (INDEPENDENT_AMBULATORY_CARE_PROVIDER_SITE_OTHER): Payer: Medicare Other | Admitting: Adult Health

## 2012-08-03 VITALS — BP 128/76 | HR 82 | Temp 97.3°F | Ht 62.5 in | Wt 173.2 lb

## 2012-08-03 DIAGNOSIS — J449 Chronic obstructive pulmonary disease, unspecified: Secondary | ICD-10-CM

## 2012-08-03 DIAGNOSIS — J4489 Other specified chronic obstructive pulmonary disease: Secondary | ICD-10-CM

## 2012-08-03 MED ORDER — AMOXICILLIN-POT CLAVULANATE 875-125 MG PO TABS: 1.0000 | ORAL_TABLET | Freq: Two times a day (BID) | ORAL | Status: AC

## 2012-08-03 NOTE — Assessment & Plan Note (Signed)
Exacerbation   Plan  Augmentin 875mg  Twice daily  For 7 days , take with food .  Mucinex DM Twice daily  As needed  Cough/congestion  Fluids and rest  Increase prednisone 20mg  daily for 1 week then back to 10mg  daily  Please contact office for sooner follow up if symptoms do not improve or worsen or seek emergency care  follow up Dr. Delford Field  As planned and As needed

## 2012-08-03 NOTE — Patient Instructions (Addendum)
Augmentin 875mg  Twice daily  For 7 days , take with food .  Mucinex DM Twice daily  As needed  Cough/congestion  Fluids and rest  Increase prednisone 20mg  daily for 1 week then back to 10mg  daily  Please contact office for sooner follow up if symptoms do not improve or worsen or seek emergency care  follow up Dr. Delford Field  As planned and As needed

## 2012-08-03 NOTE — Progress Notes (Signed)
Subjective:    Patient ID: Kaitlin Henderson, female    DOB: Mar 09, 1941, 71 y.o.   MRN: [id number]  HPI  71 y.o.WF copd Golds D   02/05/2012 Pt in hosp end of May with copd exac 5/17- 5/28 . Pt is back with [facility].  Making some progress. Notes less labored breathing.  No chest pain.  Pt will cough up thick pearls of mucus. No real edema in the feet.  Participating in PT , walking , elastic band, bounce balls.  Pt went to the kitchen for dishes.      03/02/2012 Hospice seeing the pt now.  Not much cough.  No real wheeze.  Dyspnea is better.  06/08/2012 Since 7/13 seems slightly worse.  Now in Hospice.  Pt wants to be on inhalers albuterol in the purse. >Stop Budesonide  08/03/2012 Acute OV  Complains of prod cough with light yellow mucus, increased SOB, chills/sweats x 5 days .  CAT score today >15 .  Currently on prednisone 10mg  daily  No hemoptysis or chest pain . No edema.  OTC not helping.     Past Medical History  Diagnosis Date  . Breast cancer, right breast 1998    s/p chemo & XRT, right mastectomy  . OBESITY   . CORONARY ARTERY DISEASE   . APHTHOUS STOMATITIS   . DEPRESSION     started sertraline 09/2010  . DIABETES, TYPE 2 dx 03/2010  . HYPERLIPIDEMIA   . Hypothyroidism   . C O P D     chronic O2 3LPM [place]  . Essential tremor   . NSTEMI (non-ST elevated myocardial infarction) 01/2012    med mgmt     Family History  Problem Relation Age of Onset  . Diabetes Neg Hx   . Cancer Neg Hx      History   Social History  . Marital Status: Widowed    Spouse Name: N/A    Number of Children: N/A  . Years of Education: N/A   Occupational History  . Not on file.   Social History Main Topics  . Smoking status: Former Smoker -- 2.5 packs/day for 54 years    Types: Cigarettes    Quit date: 09/02/2006  . Smokeless tobacco: Never Used     Comment: Retired. Widow/widower since 2008-lives alone. Home hospice since 06/2009 related to COPD  . Alcohol Use: No  . Drug  Use: No  . Sexually Active: Not on file   Other Topics Concern  . Not on file   Social History Narrative  . No narrative on file     Allergies  Allergen Reactions  . Codeine     REACTION: makes pt pass out  . Levaquin (Levofloxacin In D5w) Other (See Comments)    redness  . Sulfonamide Derivatives     REACTION: edema     Outpatient Prescriptions Prior to Visit  Medication Sig Dispense Refill  . albuterol (PROVENTIL HFA;VENTOLIN HFA) 108 (90 BASE) MCG/ACT inhaler Inhale 2 puffs into the lungs every 6 (six) hours as needed for wheezing.  1 Inhaler  11  . albuterol (PROVENTIL) (5 MG/ML) 0.5% nebulizer solution Take 2.5 mg by nebulization 3 (three) times daily.      [name] ALPRAZolam (XANAX) 0.5 MG tablet Take 0.5 mg by mouth 2 (two) times daily as needed.      [name] aspirin 81 MG tablet Take 81 mg by mouth daily.        . benzonatate (TESSALON) 100 MG capsule Take  100 mg by mouth every 8 (eight) hours as needed. For cough      . esomeprazole (NEXIUM) 40 MG capsule Take 40 mg by mouth daily before breakfast.      . furosemide (LASIX) 40 MG tablet TAKE 1 TABLET TWICE DAILY  60 tablet  5  . glimepiride (AMARYL) 1 MG tablet TAKE 1 TABLET TWICE DAILY  60 tablet  5  . ipratropium (ATROVENT) 0.02 % nebulizer solution Take 0.5 mg by nebulization 3 (three) times daily.      [name] KLOR-CON M20 20 MEQ tablet TAKE 1 TABLET TWICE DAILY.  60 tablet  2  . metFORMIN (GLUCOPHAGE-XR) 500 MG 24 hr tablet TAKE 1 TABLET BY MOUTH TWICE A DAY  60 tablet  5  . ONE TOUCH ULTRA TEST test strip CHECK BLOOD SUGAR TWO TIMES A DAY DX: 250.00  50 each  3  . pravastatin (PRAVACHOL) 40 MG tablet TAKE 1 TABLET BY MOUTH ONCE DAILY  30 tablet  5     Review of Systems  Constitutional:   No  weight loss, night sweats,  Fevers, chills, fatigue, lassitude. HEENT:   No headaches,  Difficulty swallowing,  Tooth/dental problems,  Sore throat,                No sneezing, itching, ear ache,  +nasal congestion, post nasal drip,    CV:  No chest pain,  Orthopnea, PND, swelling in lower extremities, anasarca, dizziness, palpitations  GI  No heartburn, indigestion, abdominal pain, nausea, vomiting, diarrhea, change in bowel habits, loss of appetite  Resp: Notes  shortness of breath with exertion not  at rest.  No chest wall deformity  Skin: no rash or lesions.  GU: no dysuria, change in color of urine, no urgency or frequency.  No flank pain.  MS:  No joint pain or swelling.  No decreased range of motion.  No back pain.  Psych:  No change in mood or affect. No depression or anxiety.  No memory loss. t    Objective:   Physical Exam     Gen: Pleasant,   in no distress,  normal affect  ENT: No lesions,  mouth clear,  oropharynx clear, no postnasal drip  Neck: No JVD, no TMG, no carotid bruits  Lungs: No use of accessory muscles, no dullness to percussion, distant BS   Cardiovascular: RRR, heart sounds normal, no murmur or gallops, no peripheral edema  Abdomen: soft and NT, no HSM,  BS normal  Musculoskeletal: No deformities, no cyanosis or clubbing  Neuro: alert, non focal  Skin: Warm, no lesions or rashes   PFT Conversion 12/06/2008  FVC 1.95  FVC PREDICT 2.76  FVC  % Predicted 71  FEV1 1.01  FEV1 PREDICT 1.97  FEV % Predicted 51  FEV1/FVC 51.8  FEV1/FVC PRE 72  FEV1/FVC%EXP   FeF 25-75 0.31  FeF 25-75 % Predicted 2.29  FEF % EXPEC 13  POST FVC 2.08  FVCPRDPST 2.76  POST FVC%EXP 75  POST FEV1 1.16  FEV1PRDPST 1.97  POSTFEV1%PRD 59  PSTFEV1/FVC 56  FEV1FVCPRDPS 72  PSTFEF25/75% 0.46  PSTFEF25/75P 2.29  FEF2575%EXPS 20  Residual Volume (ml) 2.86  RV % EXPECT 156  DLCO (ml/mmHg sec) 12.8  DLCO % EXPEC 53  DLCO/VA 4.35  DLCO/VA%EXP 122  PFT RSLT severe obstruction with significant bronchodilator response   PFT Conversion 09/25/2010  FVC   FVC PREDICT 2.755  FVC  % Predicted 54.77  FEV1   FEV1 PREDICT 2.081  FEV %  Predicted 42.52  FEV1/FVC   FEV1/FVC PRE 75.934   FEV1/FVC%EXP 77.220  FeF 25-75   FeF 25-75 % Predicted   FEF % EXPEC 27.860  POST FVC   FVCPRDPST   POST FVC%EXP   POST FEV1   FEV1PRDPST   POSTFEV1%PRD   PSTFEV1/FVC   FEV1FVCPRDPS   PSTFEF25/75%   PSTFEF25/75P   FEF2575%EXPS   Residual Volume (ml)   RV % EXPECT   DLCO (ml/mmHg sec)   DLCO % EXPEC   DLCO/VA   DLCO/VA%EXP   PFT RSLT    Assessment & Plan:   No problem-specific assessment & plan notes found for this encounter.   Updated Medication List Outpatient Encounter Prescriptions as of 08/03/2012  Medication Sig Dispense Refill  . predniSONE (DELTASONE) 10 MG tablet Take 10 mg by mouth daily.      [name] albuterol (PROVENTIL HFA;VENTOLIN HFA) 108 (90 BASE) MCG/ACT inhaler Inhale 2 puffs into the lungs every 6 (six) hours as needed for wheezing.  1 Inhaler  11  . albuterol (PROVENTIL) (5 MG/ML) 0.5% nebulizer solution Take 2.5 mg by nebulization 3 (three) times daily.      [name] ALPRAZolam (XANAX) 0.5 MG tablet Take 0.5 mg by mouth 2 (two) times daily as needed.      [name] aspirin 81 MG tablet Take 81 mg by mouth daily.        . benzonatate (TESSALON) 100 MG capsule Take 100 mg by mouth every 8 (eight) hours as needed. For cough      . esomeprazole (NEXIUM) 40 MG capsule Take 40 mg by mouth daily before breakfast.      . furosemide (LASIX) 40 MG tablet TAKE 1 TABLET TWICE DAILY  60 tablet  5  . glimepiride (AMARYL) 1 MG tablet TAKE 1 TABLET TWICE DAILY  60 tablet  5  . ipratropium (ATROVENT) 0.02 % nebulizer solution Take 0.5 mg by nebulization 3 (three) times daily.      [name] KLOR-CON M20 20 MEQ tablet TAKE 1 TABLET TWICE DAILY.  60 tablet  2  . metFORMIN (GLUCOPHAGE-XR) 500 MG 24 hr tablet TAKE 1 TABLET BY MOUTH TWICE A DAY  60 tablet  5  . ONE TOUCH ULTRA TEST test strip CHECK BLOOD SUGAR TWO TIMES A DAY DX: 250.00  50 each  3  . pravastatin (PRAVACHOL) 40 MG tablet TAKE 1 TABLET BY MOUTH ONCE DAILY  30 tablet  5

## 2012-08-10 ENCOUNTER — Telehealth: Payer: Self-pay | Admitting: Critical Care Medicine

## 2012-08-10 NOTE — Telephone Encounter (Signed)
Ok to refill 

## 2012-08-10 NOTE — Telephone Encounter (Signed)
Pt requesting  Xanax 0.5 mg take 1 tablet bid prn #60 x1 Allergies  Allergen Reactions  . Codeine     REACTION: makes pt pass out  . Levaquin (Levofloxacin In D5w) Other (See Comments)    redness  . Sulfonamide Derivatives     REACTION: edema   Dr Delford Field please advise Thank you

## 2012-08-11 ENCOUNTER — Telehealth: Payer: Self-pay | Admitting: Critical Care Medicine

## 2012-08-11 MED ORDER — ALPRAZOLAM 0.5 MG PO TABS: 0.5000 mg | ORAL_TABLET | Freq: Two times a day (BID) | ORAL | Status: DC | PRN

## 2012-08-11 MED ORDER — PREDNISONE 10 MG PO TABS: 10.0000 mg | ORAL_TABLET | Freq: Every day | ORAL | Status: DC

## 2012-08-11 NOTE — Telephone Encounter (Signed)
rx called in

## 2012-08-11 NOTE — Telephone Encounter (Signed)
Pt is needing a refill on prednisone and proair. rx sent. Pt is aware. Carron Curie, CMA

## 2012-08-20 ENCOUNTER — Telehealth: Payer: Self-pay | Admitting: Critical Care Medicine

## 2012-08-20 MED ORDER — ALBUTEROL SULFATE HFA 108 (90 BASE) MCG/ACT IN AERS: 2.0000 | INHALATION_SPRAY | Freq: Four times a day (QID) | RESPIRATORY_TRACT | Status: DC | PRN

## 2012-08-20 NOTE — Telephone Encounter (Signed)
Sample at front, pt is aware. Garvin Ellena, CMA  

## 2012-09-05 ENCOUNTER — Other Ambulatory Visit: Payer: Self-pay | Admitting: Internal Medicine

## 2012-09-09 ENCOUNTER — Telehealth: Payer: Self-pay | Admitting: Critical Care Medicine

## 2012-09-09 MED ORDER — ALBUTEROL SULFATE HFA 108 (90 BASE) MCG/ACT IN AERS: 2.0000 | INHALATION_SPRAY | Freq: Four times a day (QID) | RESPIRATORY_TRACT | Status: DC | PRN

## 2012-09-09 NOTE — Telephone Encounter (Signed)
Pt aware that we don't have samples but she requests that we send in a prescription. This has been done.

## 2012-09-17 ENCOUNTER — Encounter: Payer: Self-pay | Admitting: Internal Medicine

## 2012-09-24 ENCOUNTER — Other Ambulatory Visit: Payer: Self-pay | Admitting: Critical Care Medicine

## 2012-10-07 ENCOUNTER — Ambulatory Visit: Payer: Medicare Other | Admitting: Internal Medicine

## 2012-10-08 ENCOUNTER — Other Ambulatory Visit (INDEPENDENT_AMBULATORY_CARE_PROVIDER_SITE_OTHER): Payer: Medicare Other

## 2012-10-08 ENCOUNTER — Encounter: Payer: Self-pay | Admitting: Internal Medicine

## 2012-10-08 ENCOUNTER — Ambulatory Visit (INDEPENDENT_AMBULATORY_CARE_PROVIDER_SITE_OTHER): Payer: Medicare Other | Admitting: Internal Medicine

## 2012-10-08 ENCOUNTER — Telehealth: Payer: Self-pay | Admitting: Critical Care Medicine

## 2012-10-08 VITALS — BP 118/70 | HR 88 | Temp 97.4°F | Wt 172.8 lb

## 2012-10-08 DIAGNOSIS — E119 Type 2 diabetes mellitus without complications: Secondary | ICD-10-CM

## 2012-10-08 DIAGNOSIS — J9610 Chronic respiratory failure, unspecified whether with hypoxia or hypercapnia: Secondary | ICD-10-CM

## 2012-10-08 DIAGNOSIS — E785 Hyperlipidemia, unspecified: Secondary | ICD-10-CM

## 2012-10-08 LAB — BASIC METABOLIC PANEL
Calcium: 8.6 mg/dL (ref 8.4–10.5)
Creatinine, Ser: 1 mg/dL (ref 0.4–1.2)
GFR: 57.84 mL/min — ABNORMAL LOW (ref >60.00)
Glucose, Bld: 149 mg/dL — ABNORMAL HIGH (ref 70–99)
Sodium: 140 mEq/L (ref 135–145)

## 2012-10-08 LAB — LIPID PANEL: HDL: 58.7 mg/dL (ref >39.00)

## 2012-10-08 LAB — LDL CHOLESTEROL, DIRECT: Direct LDL: 144.7 mg/dL

## 2012-10-08 LAB — HEMOGLOBIN A1C: Hgb A1c MFr Bld: 6.5 % (ref 4.6–6.5)

## 2012-10-08 MED ORDER — PRAVASTATIN SODIUM 80 MG PO TABS: 80.0000 mg | ORAL_TABLET | Freq: Every day | ORAL | Status: DC

## 2012-10-08 MED ORDER — ALBUTEROL SULFATE HFA 108 (90 BASE) MCG/ACT IN AERS: 2.0000 | INHALATION_SPRAY | Freq: Four times a day (QID) | RESPIRATORY_TRACT | Status: DC | PRN

## 2012-10-08 NOTE — Assessment & Plan Note (Signed)
On statin - check labs q6-12 mo and titrate as needed The current medical regimen is effective;  continue present plan and medications.

## 2012-10-08 NOTE — Assessment & Plan Note (Signed)
The current medical regimen is effective;  continue present plan and medications.  Metformin daily with prn amaryl for cbg >150 - no change in chronic steroid dose Home cbg 95-150s - reasonable control for state of other health issues Also on ASA 81, statin -  Lab Results  Component Value Date   HGBA1C 5.9 06/08/2012

## 2012-10-08 NOTE — Progress Notes (Signed)
  Subjective:    Patient ID: Kaitlin Henderson, female    DOB: June 24, 1941, 72 y.o.   MRN: [id number]  HPI    Review of Systems     Objective:   Physical Exam        Assessment & Plan:

## 2012-10-08 NOTE — Telephone Encounter (Signed)
Pt aware we have no SABA samples available right now. Will send an RX to her pharmacy, [facility] on [address].

## 2012-10-08 NOTE — Patient Instructions (Signed)
It was good to see you today. We have reviewed your prior records including labs and tests today Test(s) ordered today. Your results will be released to MyChart (or called to you) after review, usually within 72hours after test completion. If any changes need to be made, you will be notified at that same time. Medications reviewed, no changes at this time Please schedule followup in 4 months for diabetes check, call sooner if problems.

## 2012-10-08 NOTE — Progress Notes (Signed)
Subjective:    Patient ID: Kaitlin Henderson, female    DOB: 25-Mar-1941, 72 y.o.   MRN: [id number]  HPI  Here for follow up - reviewed chronic medical issues:  DM2 - dx 03/2010 by routine pulm labs; started on metformin + glimepiride 03/2010; reports compliance with ongoing medical treatment and no changes in medication dose or frequency. denies adverse side effects related to current therapy. no hypoglycemic events or symptoms - checks sugars 2x/day - home cbg log reviewed - range 101-170s, lowest in AM   COPD - advanced dz, O2-dep; hospitalization summer 2013 with PNA - follows with pulm regularly; previously enrolled with hospice for same since 06/2009 through summer 2012; chronic pred + cont O2 use, inhaler changes fall 2012 with slightly improved symptoms - daily dyspnea on exertion but no cough or flares at this time - reports compliance with ongoing medical treatment and no changes in medication dose or frequency. denies adverse side effects related to current therapy.   breast cancer history. >27yr out - dx 1998 s/p chemo and xrt - s/p r mastect for same -   Anxiety/depression- largely related to trouble breathing and fear of being alone - "i have no one" since mom & spouse expired and children are "distantly involved" - uses BZ as needed - started SSRI 09/2010- then mom passed 01/2011, so she stopped medication - denies SI/HI  - not interesting in resuming SSRI at this time   Past Medical History  Diagnosis Date  . Breast cancer, right breast 1998    s/p chemo & XRT, right mastectomy  . OBESITY   . CORONARY ARTERY DISEASE   . APHTHOUS STOMATITIS   . DEPRESSION     started sertraline 09/2010  . DIABETES, TYPE 2 dx 03/2010  . HYPERLIPIDEMIA   . Hypothyroidism   . C O P D     chronic O2 3LPM [place]  . Essential tremor   . NSTEMI (non-ST elevated myocardial infarction) 01/2012    med mgmt     Review of Systems  Constitutional: Positive for fatigue. Negative for fever.  Respiratory:  Positive for shortness of breath (chronic DOE unchanged). Negative for cough and wheezing.   Cardiovascular: Negative for chest pain.  Genitourinary: Negative for dysuria and frequency.  Neurological: Negative for headaches.       Objective:   Physical Exam BP 118/70  Pulse 88  Temp 97.4 F (36.3 C) (Oral)  Wt 172 lb 12.8 oz (78.382 kg)  SpO2 92% Wt Readings from Last 3 Encounters:  10/08/12 172 lb 12.8 oz (78.382 kg)  08/03/12 173 lb 3.2 oz (78.563 kg)  06/08/12 175 lb (79.379 kg)   Constitutional: She is obese, but appears well-developed and well-nourished. No distress. Neck: Normal range of motion. Neck supple. No JVD present. No thyromegaly present.  Cardiovascular: Normal rate, regular rhythm and normal heart sounds.  No murmur heard. No BLE edema. Pulmonary/Chest: Effort normal at rest: breath sounds diminished at bases. She has no wheezes.  Skin: thinning hair but no alopecia - chronically pale but without change -  skin is warm and dry. No rash noted. No erythema.  Neuro: R hand active tremor without change Psychiatric: She has a dysphoric mood with overlapping mild anxiety. despondent about sad events in her past. Her behavior is normal. Judgment and thought content normal.      Lab Results  Component Value Date   WBC 13.3* 01/27/2012   HGB 14.9 01/27/2012   HCT 43.8 01/27/2012   PLT  285 01/27/2012   CHOL 209* 01/19/2012   TRIG 154* 01/19/2012   HDL 43 01/19/2012   ALT 23 01/28/4131   AST 22 09/25/2010   NA 137 01/28/2012   K 4.6 03/02/2012   CL 96 01/28/2012   CREATININE 1.02 01/28/2012   BUN 24* 01/28/2012   CO2 33* 01/28/2012   TSH 0.71 06/08/2012   HGBA1C 5.9 06/08/2012   MICROALBUR 0.3 11/25/2011   Assessment & Plan:   See problem list. Medications and labs reviewed today.

## 2012-10-08 NOTE — Addendum Note (Signed)
Addended by: [name] A on: 10/08/2012 08:22 PM   Modules accepted: Orders

## 2012-10-08 NOTE — Assessment & Plan Note (Signed)
GOLD stage D - advanced dz s/p hosp 01/2012 with PNA, complicated by NSTEMI (med mgmt) Currently at baseline Chronic pred/O2, Continue nebs Previously with home hospice for same Mgmt as per pulm (wright)

## 2012-10-12 ENCOUNTER — Ambulatory Visit: Payer: Medicare Other | Admitting: Internal Medicine

## 2012-10-23 ENCOUNTER — Ambulatory Visit (INDEPENDENT_AMBULATORY_CARE_PROVIDER_SITE_OTHER)
Admission: RE | Admit: 2012-10-23 | Discharge: 2012-10-23 | Disposition: A | Discharge status: Home / Self Care | Source: Ambulatory Visit | Attending: Critical Care Medicine | Admitting: Critical Care Medicine

## 2012-10-23 ENCOUNTER — Ambulatory Visit (INDEPENDENT_AMBULATORY_CARE_PROVIDER_SITE_OTHER): Admitting: Critical Care Medicine

## 2012-10-23 ENCOUNTER — Encounter: Payer: Self-pay | Admitting: Critical Care Medicine

## 2012-10-23 ENCOUNTER — Ambulatory Visit: Payer: Medicare Other | Admitting: Critical Care Medicine

## 2012-10-23 ENCOUNTER — Telehealth: Payer: Self-pay | Admitting: *Deleted

## 2012-10-23 VITALS — BP 118/78 | HR 88 | Temp 98.5°F | Ht 62.5 in | Wt 172.0 lb

## 2012-10-23 DIAGNOSIS — M25539 Pain in unspecified wrist: Secondary | ICD-10-CM

## 2012-10-23 DIAGNOSIS — S62109A Fracture of unspecified carpal bone, unspecified wrist, initial encounter for closed fracture: Secondary | ICD-10-CM

## 2012-10-23 DIAGNOSIS — S62101A Fracture of unspecified carpal bone, right wrist, initial encounter for closed fracture: Secondary | ICD-10-CM

## 2012-10-23 DIAGNOSIS — M25531 Pain in right wrist: Secondary | ICD-10-CM

## 2012-10-23 DIAGNOSIS — J449 Chronic obstructive pulmonary disease, unspecified: Secondary | ICD-10-CM

## 2012-10-23 NOTE — Telephone Encounter (Signed)
Left msg on vm thurs 10/22/12 requesting call back, i was not in the office on yesterday so call pt back this am no answer LMOM RTC....Raechel Chute

## 2012-10-23 NOTE — Progress Notes (Signed)
Subjective:    Patient ID: Kaitlin Henderson, female    DOB: 06-06-1941, 72 y.o.   MRN: [id number]  HPI  72 y.o.WF copd Golds D   10/23/2012 Pt seen by NP 12/13 for exac and Rx pred/augmentin. Pt needs eye surgery 11/11/12. Henderson, Kaitlin Hidden.   No real  Cough.   No mucus unless uses inhaler .   Fell on ice and hurt R wrist.    Past Medical History  Diagnosis Date  . Breast cancer, right breast 1998    s/p chemo & XRT, right mastectomy  . OBESITY   . CORONARY ARTERY DISEASE   . APHTHOUS STOMATITIS   . DEPRESSION     started sertraline 09/2010  . DIABETES, TYPE 2 dx 03/2010  . HYPERLIPIDEMIA   . Hypothyroidism   . C O P D     chronic O2 3LPM [place]  . Essential tremor   . NSTEMI (non-ST elevated myocardial infarction) 01/2012    med mgmt     Family History  Problem Relation Age of Onset  . Diabetes Neg Hx   . Cancer Neg Hx      History   Social History  . Marital Status: Widowed    Spouse Name: N/A    Number of Children: N/A  . Years of Education: N/A   Occupational History  . Not on file.   Social History Main Topics  . Smoking status: Former Smoker -- 2.50 packs/day for 54 years    Types: Cigarettes    Quit date: 09/02/2006  . Smokeless tobacco: Never Used     Comment: Retired. Widow/widower since 2008-lives alone. Home hospice since 06/2009 related to COPD  . Alcohol Use: No  . Drug Use: No  . Sexually Active: Not on file   Other Topics Concern  . Not on file   Social History Narrative  . No narrative on file     Allergies  Allergen Reactions  . Codeine     REACTION: makes pt pass out  . Levaquin (Levofloxacin In D5w) Other (See Comments)    redness  . Sulfonamide Derivatives     REACTION: edema     Outpatient Prescriptions Prior to Visit  Medication Sig Dispense Refill  . albuterol (PROVENTIL HFA;VENTOLIN HFA) 108 (90 BASE) MCG/ACT inhaler Inhale 2 puffs into the lungs every 6 (six) hours as needed for wheezing.  1 Inhaler  5  . albuterol  (PROVENTIL) (5 MG/ML) 0.5% nebulizer solution Take 2.5 mg by nebulization 3 (three) times daily.      [name] aspirin 81 MG tablet Take 81 mg by mouth daily.        . benzonatate (TESSALON) 100 MG capsule Take 100 mg by mouth every 8 (eight) hours as needed. For cough      . furosemide (LASIX) 40 MG tablet TAKE 1 TABLET TWICE DAILY  60 tablet  2  . glimepiride (AMARYL) 1 MG tablet TAKE 1 TABLET TWICE DAILY  60 tablet  5  . ipratropium (ATROVENT) 0.02 % nebulizer solution Take 0.5 mg by nebulization 3 (three) times daily.      [name] KLOR-CON M20 20 MEQ tablet TAKE 1 TABLET TWICE DAILY.  60 tablet  2  . metFORMIN (GLUCOPHAGE-XR) 500 MG 24 hr tablet TAKE 1 TABLET BY MOUTH TWICE A DAY  60 tablet  5  . NEXIUM 40 MG capsule TAKE 1 CAPSULE BY MOUTH ONCE A DAY  30 capsule  5  . ONE TOUCH ULTRA TEST test strip CHECK BLOOD  SUGAR TWO TIMES A DAY DX: 250.00  50 each  3  . pravastatin (PRAVACHOL) 80 MG tablet Take 1 tablet (80 mg total) by mouth daily.  30 tablet  5  . predniSONE (DELTASONE) 10 MG tablet Take 1 tablet (10 mg total) by mouth daily.  30 tablet  2  . ALPRAZolam (XANAX) 0.5 MG tablet Take 1 tablet (0.5 mg total) by mouth 2 (two) times daily as needed.  60 tablet  1  . esomeprazole (NEXIUM) 40 MG capsule Take 40 mg by mouth daily before breakfast.       No facility-administered medications prior to visit.     Review of Systems  Constitutional:   No  weight loss, night sweats,  Fevers, chills, fatigue, lassitude. HEENT:   No headaches,  Difficulty swallowing,  Tooth/dental problems,  Sore throat,                No sneezing, itching, ear ache,  +nasal congestion, post nasal drip,   CV:  No chest pain,  Orthopnea, PND, swelling in lower extremities, anasarca, dizziness, palpitations  GI  No heartburn, indigestion, abdominal pain, nausea, vomiting, diarrhea, change in bowel habits, loss of appetite  Resp: Notes  shortness of breath with exertion not  at rest.  No chest wall deformity  Skin: no rash  or lesions.  GU: no dysuria, change in color of urine, no urgency or frequency.  No flank pain.  MS: c/o R wrist pain  Psych:  No change in mood or affect. No depression or anxiety.  No memory loss.     Objective:   Physical Exam    BP 118/78  Pulse 88  Temp(Src) 98.5 F (36.9 C) (Oral)  Ht 5' 2.5" (1.588 m)  Wt 78.019 kg (172 lb)  BMI 30.94 kg/m2  SpO2 94%  Gen: Pleasant,   in no distress,  normal affect  ENT: No lesions,  mouth clear,  oropharynx clear, no postnasal drip  Neck: No JVD, no TMG, no carotid bruits  Lungs: No use of accessory muscles, no dullness to percussion, distant BS   Cardiovascular: RRR, heart sounds normal, no murmur or gallops, no peripheral edema  Abdomen: soft and NT, no HSM,  BS normal  Musculoskeletal: tender R Wrist   Neuro: alert, non focal  Skin: Warm, no lesions or rashes   PFT Conversion 12/06/2008  FVC 1.95  FVC PREDICT 2.76  FVC  % Predicted 71  FEV1 1.01  FEV1 PREDICT 1.97  FEV % Predicted 51  FEV1/FVC 51.8  FEV1/FVC PRE 72  FEV1/FVC%EXP   FeF 25-75 0.31  FeF 25-75 % Predicted 2.29  FEF % EXPEC 13  POST FVC 2.08  FVCPRDPST 2.76  POST FVC%EXP 75  POST FEV1 1.16  FEV1PRDPST 1.97  POSTFEV1%PRD 59  PSTFEV1/FVC 56  FEV1FVCPRDPS 72  PSTFEF25/75% 0.46  PSTFEF25/75P 2.29  FEF2575%EXPS 20  Residual Volume (ml) 2.86  RV % EXPECT 156  DLCO (ml/mmHg sec) 12.8  DLCO % EXPEC 53  DLCO/VA 4.35  DLCO/VA%EXP 122  PFT RSLT severe obstruction with significant bronchodilator response   PFT Conversion 09/25/2010  FVC   FVC PREDICT 2.755  FVC  % Predicted 54.77  FEV1   FEV1 PREDICT 2.081  FEV % Predicted 42.52  FEV1/FVC   FEV1/FVC PRE 75.934  FEV1/FVC%EXP 77.220  FeF 25-75   FeF 25-75 % Predicted   FEF % EXPEC 27.860  POST FVC   FVCPRDPST   POST FVC%EXP   POST FEV1   FEV1PRDPST  POSTFEV1%PRD   PSTFEV1/FVC   FEV1FVCPRDPS   PSTFEF25/75%   PSTFEF25/75P   FEF2575%EXPS   Residual Volume (ml)   RV % EXPECT   DLCO  (ml/mmHg sec)   DLCO % EXPEC   DLCO/VA   DLCO/VA%EXP   PFT RSLT   Dg Wrist Complete Right  10/23/2012  *RADIOLOGY REPORT*  Clinical Data: Wrist pain.  RIGHT WRIST - COMPLETE 3+ VIEW  Comparison: None  Findings: Mild degenerative changes of the first carpal metacarpal joint. No acute bony abnormality.  Specifically, no fracture, subluxation, or dislocation.  Soft tissues are intact.  IMPRESSION: No acute bony abnormality.   Original Report Authenticated By: Charlett Nose, M.D.    Assessment & Plan:   C O P D Gold D Gold D Copd with chronic resp failure Plan No change in medications. Return in        3 months    Updated Medication List Outpatient Encounter Prescriptions as of 10/23/2012  Medication Sig Dispense Refill  . albuterol (PROVENTIL HFA;VENTOLIN HFA) 108 (90 BASE) MCG/ACT inhaler Inhale 2 puffs into the lungs every 6 (six) hours as needed for wheezing.  1 Inhaler  5  . albuterol (PROVENTIL) (5 MG/ML) 0.5% nebulizer solution Take 2.5 mg by nebulization 3 (three) times daily.      [name] ALPRAZolam (XANAX) 0.5 MG tablet Take 0.25 mg by mouth 2 (two) times daily as needed.      [name] aspirin 81 MG tablet Take 81 mg by mouth daily.        . benzonatate (TESSALON) 100 MG capsule Take 100 mg by mouth every 8 (eight) hours as needed. For cough      . BESIVANCE 0.6 % SUSP Place 1 drop into the right eye every 2 (two) hours.      . DUREZOL 0.05 % EMUL Apply to right eye twice daily      . furosemide (LASIX) 40 MG tablet TAKE 1 TABLET TWICE DAILY  60 tablet  2  . glimepiride (AMARYL) 1 MG tablet TAKE 1 TABLET TWICE DAILY  60 tablet  5  . ILEVRO 0.3 % SUSP Place 1 drop into the right eye every 2 (two) hours.      [name] ipratropium (ATROVENT) 0.02 % nebulizer solution Take 0.5 mg by nebulization 3 (three) times daily.      [name] KLOR-CON M20 20 MEQ tablet TAKE 1 TABLET TWICE DAILY.  60 tablet  2  . metFORMIN (GLUCOPHAGE-XR) 500 MG 24 hr tablet TAKE 1 TABLET BY MOUTH TWICE A DAY  60 tablet  5  . NEXIUM 40 MG  capsule TAKE 1 CAPSULE BY MOUTH ONCE A DAY  30 capsule  5  . ONE TOUCH ULTRA TEST test strip CHECK BLOOD SUGAR TWO TIMES A DAY DX: 250.00  50 each  3  . pravastatin (PRAVACHOL) 80 MG tablet Take 1 tablet (80 mg total) by mouth daily.  30 tablet  5  . predniSONE (DELTASONE) 10 MG tablet Take 1 tablet (10 mg total) by mouth daily.  30 tablet  2  . [DISCONTINUED] ALPRAZolam (XANAX) 0.5 MG tablet Take 1 tablet (0.5 mg total) by mouth 2 (two) times daily as needed.  60 tablet  1  . ofloxacin (OCUFLOX) 0.3 % ophthalmic solution Use prior to eye surgery as directed      . prednisoLONE acetate (PRED FORTE) 1 % ophthalmic suspension Use prior to eye surgery as directed      . [DISCONTINUED] esomeprazole (NEXIUM) 40 MG capsule Take 40 mg by  mouth daily before breakfast.       No facility-administered encounter medications on file as of 10/23/2012.

## 2012-10-23 NOTE — Patient Instructions (Addendum)
Obtain a wrist splint from a pharmacy An orthopedic referral will be made See samples for nebulizer Return 4 months

## 2012-10-23 NOTE — Progress Notes (Signed)
Quick Note:  Called, spoke with pt. Informed her of results and recs per Dr. Delford Field. She verbalized understanding of this and voiced no further questions or concerns at this time. ______

## 2012-10-23 NOTE — Telephone Encounter (Signed)
Called pt again no answer on home phone x's 10 rings. LMOM (cell) to RTC....Raechel Chute

## 2012-10-24 NOTE — Assessment & Plan Note (Signed)
Gold D Copd with chronic resp failure Plan No change in medications. Return in        3 months

## 2012-10-26 ENCOUNTER — Telehealth: Payer: Self-pay | Admitting: Critical Care Medicine

## 2012-10-26 NOTE — Telephone Encounter (Signed)
Spoke with patient-states she has the phone number to the ortho MD she was due to see; she stated she is unable to keep the appt due to money/transportation. Pt is aware to call and cancel the appt herself and I will send this message to PW as an FYI.

## 2012-10-26 NOTE — Telephone Encounter (Signed)
Called pt again no answer x's 10 rings. Closing phone note,,,,/lmb

## 2012-10-26 NOTE — Telephone Encounter (Signed)
noted 

## 2012-10-28 ENCOUNTER — Telehealth: Payer: Self-pay | Admitting: Critical Care Medicine

## 2012-10-28 NOTE — Telephone Encounter (Signed)
What kind of letter/form is she needing?  Typically a specific form is provided by the power company. ATC pt x1 > line rang 10 times with no answer and no option to LM.  WCB.

## 2012-10-28 NOTE — Telephone Encounter (Signed)
Spoke with pt  She states that she is needing letter faxed to [employer] stating that she is o2 dependent  She does not know the fax number to the [employer], so I called them at [phone] to get this information  I had to [facility] Will forward to CJ to followup on the letter per triage protocol

## 2012-10-28 NOTE — Telephone Encounter (Signed)
Pt returned call. Kathleen W Perdue  

## 2012-10-29 ENCOUNTER — Encounter: Payer: Self-pay | Admitting: *Deleted

## 2012-10-29 NOTE — Telephone Encounter (Signed)
Pt called back this morning. She thought this would have been faxed to [facility]. She is upset because she called them this morning and they haven't received a fax yet. Her power will be cut off today if this isn't done "now". [name]

## 2012-10-29 NOTE — Telephone Encounter (Signed)
Spoke with pt and advised that we need the number to send the letter to I never got a call back from [employer] yet Pt now has the fax number- [phone]  I have created letter and faxed to this number  Nothing further needed per pt

## 2012-11-02 ENCOUNTER — Other Ambulatory Visit: Payer: Self-pay | Admitting: Critical Care Medicine

## 2012-11-03 ENCOUNTER — Telehealth: Payer: Self-pay | Admitting: *Deleted

## 2012-11-03 ENCOUNTER — Telehealth: Payer: Self-pay | Admitting: Critical Care Medicine

## 2012-11-03 ENCOUNTER — Other Ambulatory Visit: Payer: Self-pay | Admitting: Adult Health

## 2012-11-03 ENCOUNTER — Other Ambulatory Visit: Payer: Self-pay | Admitting: Critical Care Medicine

## 2012-11-03 MED ORDER — PREDNISONE 10 MG PO TABS: 10.0000 mg | ORAL_TABLET | Freq: Every day | ORAL | Status: DC

## 2012-11-03 MED ORDER — FUROSEMIDE 40 MG PO TABS: 40.0000 mg | ORAL_TABLET | Freq: Two times a day (BID) | ORAL | Status: DC

## 2012-11-03 MED ORDER — POTASSIUM CHLORIDE CRYS ER 20 MEQ PO TBCR: 20.0000 meq | EXTENDED_RELEASE_TABLET | Freq: Two times a day (BID) | ORAL | Status: DC

## 2012-11-03 MED ORDER — ALPRAZOLAM 0.5 MG PO TABS: 0.2500 mg | ORAL_TABLET | Freq: Two times a day (BID) | ORAL | Status: DC | PRN

## 2012-11-03 NOTE — Telephone Encounter (Signed)
ok 

## 2012-11-03 NOTE — Telephone Encounter (Signed)
Faxed script [facility] to [facility].../lmb 

## 2012-11-03 NOTE — Telephone Encounter (Signed)
Requesting refill on her alprazolam.../lmb

## 2012-11-03 NOTE — Telephone Encounter (Signed)
Called and spoke with pt and she is aware of refills that have been sent to the pharmacy.  Nothing further is needed.  

## 2012-11-04 ENCOUNTER — Other Ambulatory Visit: Payer: Self-pay | Admitting: Critical Care Medicine

## 2012-11-08 ENCOUNTER — Emergency Department (HOSPITAL_COMMUNITY): Payer: No Typology Code available for payment source

## 2012-11-08 ENCOUNTER — Emergency Department (HOSPITAL_COMMUNITY)
Admission: EM | Admit: 2012-11-08 | Discharge: 2012-11-08 | Disposition: A | Discharge status: Home / Self Care | Payer: No Typology Code available for payment source | Attending: Emergency Medicine | Admitting: Emergency Medicine

## 2012-11-08 ENCOUNTER — Encounter (HOSPITAL_COMMUNITY): Payer: Self-pay | Admitting: Emergency Medicine

## 2012-11-08 DIAGNOSIS — S4980XA Other specified injuries of shoulder and upper arm, unspecified arm, initial encounter: Secondary | ICD-10-CM | POA: Insufficient documentation

## 2012-11-08 DIAGNOSIS — Y9389 Activity, other specified: Secondary | ICD-10-CM | POA: Insufficient documentation

## 2012-11-08 DIAGNOSIS — Z8679 Personal history of other diseases of the circulatory system: Secondary | ICD-10-CM | POA: Insufficient documentation

## 2012-11-08 DIAGNOSIS — Z79899 Other long term (current) drug therapy: Secondary | ICD-10-CM | POA: Insufficient documentation

## 2012-11-08 DIAGNOSIS — Z7982 Long term (current) use of aspirin: Secondary | ICD-10-CM | POA: Insufficient documentation

## 2012-11-08 DIAGNOSIS — S5291XA Unspecified fracture of right forearm, initial encounter for closed fracture: Secondary | ICD-10-CM

## 2012-11-08 DIAGNOSIS — J4489 Other specified chronic obstructive pulmonary disease: Secondary | ICD-10-CM | POA: Insufficient documentation

## 2012-11-08 DIAGNOSIS — E119 Type 2 diabetes mellitus without complications: Secondary | ICD-10-CM | POA: Insufficient documentation

## 2012-11-08 DIAGNOSIS — F3289 Other specified depressive episodes: Secondary | ICD-10-CM | POA: Insufficient documentation

## 2012-11-08 DIAGNOSIS — Z87891 Personal history of nicotine dependence: Secondary | ICD-10-CM | POA: Insufficient documentation

## 2012-11-08 DIAGNOSIS — IMO0002 Reserved for concepts with insufficient information to code with codable children: Secondary | ICD-10-CM | POA: Insufficient documentation

## 2012-11-08 DIAGNOSIS — Z853 Personal history of malignant neoplasm of breast: Secondary | ICD-10-CM | POA: Insufficient documentation

## 2012-11-08 DIAGNOSIS — F329 Major depressive disorder, single episode, unspecified: Secondary | ICD-10-CM | POA: Insufficient documentation

## 2012-11-08 DIAGNOSIS — I252 Old myocardial infarction: Secondary | ICD-10-CM | POA: Insufficient documentation

## 2012-11-08 DIAGNOSIS — J449 Chronic obstructive pulmonary disease, unspecified: Secondary | ICD-10-CM | POA: Insufficient documentation

## 2012-11-08 DIAGNOSIS — I2510 Atherosclerotic heart disease of native coronary artery without angina pectoris: Secondary | ICD-10-CM | POA: Insufficient documentation

## 2012-11-08 DIAGNOSIS — S46909A Unspecified injury of unspecified muscle, fascia and tendon at shoulder and upper arm level, unspecified arm, initial encounter: Secondary | ICD-10-CM | POA: Insufficient documentation

## 2012-11-08 DIAGNOSIS — E669 Obesity, unspecified: Secondary | ICD-10-CM | POA: Insufficient documentation

## 2012-11-08 DIAGNOSIS — Z8669 Personal history of other diseases of the nervous system and sense organs: Secondary | ICD-10-CM | POA: Insufficient documentation

## 2012-11-08 DIAGNOSIS — Z8639 Personal history of other endocrine, nutritional and metabolic disease: Secondary | ICD-10-CM | POA: Insufficient documentation

## 2012-11-08 DIAGNOSIS — S52599A Other fractures of lower end of unspecified radius, initial encounter for closed fracture: Secondary | ICD-10-CM | POA: Insufficient documentation

## 2012-11-08 DIAGNOSIS — E785 Hyperlipidemia, unspecified: Secondary | ICD-10-CM | POA: Insufficient documentation

## 2012-11-08 DIAGNOSIS — Z862 Personal history of diseases of the blood and blood-forming organs and certain disorders involving the immune mechanism: Secondary | ICD-10-CM | POA: Insufficient documentation

## 2012-11-08 DIAGNOSIS — Y92410 Unspecified street and highway as the place of occurrence of the external cause: Secondary | ICD-10-CM | POA: Insufficient documentation

## 2012-11-08 MED ORDER — TRAMADOL HCL 50 MG PO TABS: 50.0000 mg | ORAL_TABLET | Freq: Four times a day (QID) | ORAL | Status: DC | PRN
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
## 2012-11-08 NOTE — ED Notes (Signed)
Pt ambulating independently w/ steady gait on d/c in no acute distress, A&Ox4. D/c instructions reviewed w/ pt and family - pt and family deny any further questions or concerns at present. Rx given x1  

## 2012-11-08 NOTE — ED Notes (Signed)
Pt reports she normally has oxygen level off 88% d/t normally wearing oxygen and her PCP is aware.
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
## 2012-11-10 ENCOUNTER — Telehealth: Payer: Self-pay | Admitting: Critical Care Medicine

## 2012-11-10 NOTE — Telephone Encounter (Signed)
atc na wcb UA to leave VM

## 2012-11-11 NOTE — Telephone Encounter (Signed)
noted 

## 2012-11-11 NOTE — Telephone Encounter (Signed)
I will forward this as an FYI to Dr. Delford Field. Nothing needed per pt. Carron Curie, CMA

## 2012-11-23 ENCOUNTER — Other Ambulatory Visit (HOSPITAL_COMMUNITY): Payer: Self-pay | Admitting: Orthopedic Surgery

## 2012-11-23 ENCOUNTER — Ambulatory Visit (HOSPITAL_COMMUNITY)
Admission: RE | Admit: 2012-11-23 | Discharge: 2012-11-23 | Disposition: A | Discharge status: Home / Self Care | Payer: No Typology Code available for payment source | Source: Ambulatory Visit | Attending: Orthopedic Surgery | Admitting: Orthopedic Surgery

## 2012-11-23 DIAGNOSIS — M7989 Other specified soft tissue disorders: Secondary | ICD-10-CM | POA: Insufficient documentation

## 2012-11-23 DIAGNOSIS — M25561 Pain in right knee: Secondary | ICD-10-CM

## 2012-11-23 DIAGNOSIS — M79609 Pain in unspecified limb: Secondary | ICD-10-CM | POA: Insufficient documentation

## 2012-11-23 NOTE — Progress Notes (Signed)
VASCULAR LAB PRELIMINARY  PRELIMINARY  PRELIMINARY  PRELIMINARY  Right lower extremity venous duplex completed.    Preliminary report:  Right:  No evidence of DVT, superficial thrombosis, or Baker's cyst.  Kaitlin Henderson, RVS 11/23/2012, 6:04 PM

## 2012-12-01 ENCOUNTER — Telehealth: Payer: Self-pay | Admitting: Critical Care Medicine

## 2012-12-01 MED ORDER — ALBUTEROL SULFATE HFA 108 (90 BASE) MCG/ACT IN AERS: 2.0000 | INHALATION_SPRAY | Freq: Four times a day (QID) | RESPIRATORY_TRACT | Status: DC | PRN
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
## 2012-12-01 NOTE — Telephone Encounter (Signed)
Will need preop ov with tparrett   Ok for proair sample

## 2012-12-01 NOTE — Telephone Encounter (Signed)
Patient has been scheduled to see TP Wed 12/02/12 at 1130am. Patient aware and nothing further needed at this time.

## 2012-12-02 ENCOUNTER — Encounter: Payer: Self-pay | Admitting: Internal Medicine

## 2012-12-02 ENCOUNTER — Ambulatory Visit (INDEPENDENT_AMBULATORY_CARE_PROVIDER_SITE_OTHER): Payer: Medicare Other | Admitting: Internal Medicine

## 2012-12-02 ENCOUNTER — Ambulatory Visit: Payer: Medicare Other | Admitting: Adult Health

## 2012-12-02 ENCOUNTER — Telehealth: Payer: Self-pay | Admitting: Internal Medicine

## 2012-12-02 VITALS — BP 120/68 | HR 75 | Temp 98.3°F | Ht 62.5 in | Wt 157.0 lb

## 2012-12-02 DIAGNOSIS — J9610 Chronic respiratory failure, unspecified whether with hypoxia or hypercapnia: Secondary | ICD-10-CM

## 2012-12-02 DIAGNOSIS — J449 Chronic obstructive pulmonary disease, unspecified: Secondary | ICD-10-CM

## 2012-12-02 NOTE — Assessment & Plan Note (Signed)
GOLD II s/p remote smoking cessation using 02 prn  The proper method of use, as well as anticipated side effects, of a metered-dose inhaler are discussed and demonstrated to the patient. Improved effectiveness after extensive coaching during this visit to a level of approximately  75%   Vastly overusing albuterol, remains prednisone dependent, but should do ok with knee surgery in absence of flare of asthma or bronchitis.  No change rx for now.    Each maintenance medication was reviewed in detail including most importantly the difference between maintenance and as needed and under what circumstances the prns are to be used.  Please see instructions for details which were reviewed in writing and the patient given a copy.

## 2012-12-02 NOTE — Telephone Encounter (Signed)
Noted thanks °

## 2012-12-02 NOTE — Assessment & Plan Note (Signed)
Adequate control on present rx, reviewed ok to leave 02 off at rest (sats ok here) and use prn with activity, always at rest

## 2012-12-02 NOTE — Patient Instructions (Addendum)
Work on inhaler technique:  relax and gently blow all the way out then take a nice smooth deep breath back in, triggering the inhaler at same time you start breathing in.  Hold for up to 5 seconds if you can.  Rinse and gargle with water when done   If your mouth or throat starts to bother you,   I suggest you time the inhaler to your dental care and after using the inhaler(s) brush teeth and tongue with a baking soda containing toothpaste and when you rinse this out, gargle with it first to see if this helps your mouth and throat.    Only use your albuterol as a rescue medication to be used if you can't catch your breath by resting or doing a relaxed purse lip breathing pattern. The less you use it, the better it will work when you need it.   You are cleared for knee surgery   Please schedule a follow up visit in 3 months but call sooner if needed with Dr Delford Field
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
## 2012-12-02 NOTE — Progress Notes (Signed)
Subjective:    Patient ID: Kaitlin Henderson, female    DOB: 10/10/1940, 72 y.o.   MRN: [id number]  HPI  72 y.o.WF copd Gold  II/  D quit smoking 2008  10/23/2012 Pt seen by NP 12/13 for exac and Rx pred/augmentin. Pt needs eye surgery 11/11/12. [name], [name].   No real  Cough.   No mucus unless uses inhaler .   Fell on ice and hurt R wrist.  rec Obtain a wrist splint from a pharmacy An orthopedic referral will be made See samples for nebulizer   12/02/2012 preop eval for R knee arthroscopy [facility] cc doe = leans on buggy to do shopping, has 02 uses prn and sleeps ok on 02 3lpm on prednisone x years takes 10 mg once each plus proaire and ventolin 4-6 puffs  x since got up 6 hours prior to OV   and no neb needed.   No obvious daytime variabilty or assoc chronic cough or cp or chest tightness, subjective wheeze overt sinus or hb symptoms. No unusual exp hx or h/o childhood pna/ asthma or premature birth to her knowledge.   Sleeping ok without nocturnal  or early am exacerbation  of respiratory  c/o's or need for noct saba. Also denies any obvious fluctuation of symptoms with weather or environmental changes or other aggravating or alleviating factors except as outlined above    Past Medical History  Diagnosis Date  . Breast cancer, right breast 1998    s/p chemo & XRT, right mastectomy  . OBESITY   . CORONARY ARTERY DISEASE   . APHTHOUS STOMATITIS   . DEPRESSION     started sertraline 09/2010  . DIABETES, TYPE 2 dx 03/2010  . HYPERLIPIDEMIA   . Hypothyroidism   . C O P D     chronic O2 3LPM [place]  . Essential tremor   . NSTEMI (non-ST elevated myocardial infarction) 01/2012    med mgmt     Family History  Problem Relation Age of Onset  . Diabetes Neg Hx   . Cancer Neg Hx      History   Social History  . Marital Status: Widowed    Spouse Name: N/A    Number of Children: N/A  . Years of Education: N/A   Occupational History  . Not on file.   Social History Main Topics   . Smoking status: Former Smoker -- 2.50 packs/day for 54 years    Types: Cigarettes    Quit date: 09/02/2006  . Smokeless tobacco: Never Used     Comment: Retired. Widow/widower since 2008-lives alone. Home hospice since 06/2009 related to COPD  . Alcohol Use: No  . Drug Use: No  . Sexually Active: Not on file   Other Topics Concern  . Not on file   Social History Narrative  . No narrative on file     Allergies  Allergen Reactions  . Codeine     REACTION: makes pt pass out  . Levaquin (Levofloxacin In D5w) Other (See Comments)    redness  . Sulfonamide Derivatives     REACTION: edema     Outpatient Prescriptions Prior to Visit  Medication Sig Dispense Refill  . albuterol (PROVENTIL HFA;VENTOLIN HFA) 108 (90 BASE) MCG/ACT inhaler Inhale 2 puffs into the lungs every 6 (six) hours as needed for wheezing.  1 Inhaler  5  . albuterol (PROVENTIL) (5 MG/ML) 0.5% nebulizer solution Take 2.5 mg by nebulization 3 (three) times daily.      [name]  aspirin 81 MG tablet Take 81 mg by mouth daily.        . benzonatate (TESSALON) 100 MG capsule Take 100 mg by mouth every 8 (eight) hours as needed. For cough      . furosemide (LASIX) 40 MG tablet TAKE 1 TABLET TWICE DAILY  60 tablet  2  . glimepiride (AMARYL) 1 MG tablet TAKE 1 TABLET TWICE DAILY  60 tablet  5  . ipratropium (ATROVENT) 0.02 % nebulizer solution Take 0.5 mg by nebulization 3 (three) times daily.      [name] KLOR-CON M20 20 MEQ tablet TAKE 1 TABLET TWICE DAILY.  60 tablet  2  . metFORMIN (GLUCOPHAGE-XR) 500 MG 24 hr tablet TAKE 1 TABLET BY MOUTH TWICE A DAY  60 tablet  5  . NEXIUM 40 MG capsule TAKE 1 CAPSULE BY MOUTH ONCE A DAY  30 capsule  5  . ONE TOUCH ULTRA TEST test strip CHECK BLOOD SUGAR TWO TIMES A DAY DX: 250.00  50 each  3  . pravastatin (PRAVACHOL) 80 MG tablet Take 1 tablet (80 mg total) by mouth daily.  30 tablet  5  . predniSONE (DELTASONE) 10 MG tablet Take 1 tablet (10 mg total) by mouth daily.  30 tablet  2  .  ALPRAZolam (XANAX) 0.5 MG tablet Take 1 tablet (0.5 mg total) by mouth 2 (two) times daily as needed.  60 tablet  1  . esomeprazole (NEXIUM) 40 MG capsule Take 40 mg by mouth daily before breakfast.       No facility-administered medications prior to visit.          Objective:   Physical Exam  Wt Readings from Last 3 Encounters:  12/02/12 157 lb (71.215 kg)  11/08/12 169 lb (76.658 kg)  10/23/12 172 lb (78.019 kg)      Gen: chronically ill amb wf, unusual affect, did not answer any questions in a straightforward manner   HEENT mild turbinate edema.  Oropharynx no thrush or excess pnd or cobblestoning.  No JVD or cervical adenopathy. Mild accessory muscle hypertrophy. Trachea midline, nl thryroid. Chest was hyperinflated by percussion with diminished breath sounds and moderate increased exp time without wheeze. Hoover sign positive at mid inspiration. Regular rate and rhythm without murmur gallop or rub or increase P2 or edema.  Abd: no hsm, nl excursion. Ext warm without cyanosis or clubbing.    cxr > none recently   PFT Conversion 12/06/2008  FVC 1.95  FVC PREDICT 2.76  FVC  % Predicted 71  FEV1 1.01  FEV1 PREDICT 1.97  FEV % Predicted 51  FEV1/FVC 51.8  FEV1/FVC PRE 72  FEV1/FVC%EXP   FeF 25-75 0.31  FeF 25-75 % Predicted 2.29  FEF % EXPEC 13  POST FVC 2.08  FVCPRDPST 2.76  POST FVC%EXP 75  POST FEV1 1.16  FEV1PRDPST 1.97  POSTFEV1%PRD 59  PSTFEV1/FVC 56  FEV1FVCPRDPS 72  PSTFEF25/75% 0.46  PSTFEF25/75P 2.29  FEF2575%EXPS 20  Residual Volume (ml) 2.86  RV % EXPECT 156  DLCO (ml/mmHg sec) 12.8  DLCO % EXPEC 53  DLCO/VA 4.35  DLCO/VA%EXP 122  PFT RSLT severe obstruction with significant bronchodilator response   PFT Conversion 09/25/2010  FVC   FVC PREDICT 2.755  FVC  % Predicted 54.77  FEV1   FEV1 PREDICT 2.081  FEV % Predicted 42.52  FEV1/FVC   FEV1/FVC PRE 75.934  FEV1/FVC%EXP 77.220  FeF 25-75   FeF 25-75 % Predicted   FEF % EXPEC 27.860  POST  FVC  FVCPRDPST   POST FVC%EXP   POST FEV1   FEV1PRDPST   POSTFEV1%PRD   PSTFEV1/FVC   FEV1FVCPRDPS   PSTFEF25/75%   PSTFEF25/75P   FEF2575%EXPS   Residual Volume (ml)   RV % EXPECT   DLCO (ml/mmHg sec)   DLCO % EXPEC   DLCO/VA   DLCO/VA%EXP   PFT RSLT         Assessment & Plan:

## 2012-12-11 ENCOUNTER — Telehealth: Payer: Self-pay | Admitting: Internal Medicine

## 2012-12-11 NOTE — Telephone Encounter (Signed)
Tried calling patient to schedule surgery clearance appt, however, both pt's numbers are out of service.

## 2012-12-11 NOTE — Telephone Encounter (Signed)
Pt sent msg previously informing md she will be having surgery. Placing medical clearance form on her desk for completion...lmb

## 2012-12-21 ENCOUNTER — Telehealth: Payer: Self-pay | Admitting: Critical Care Medicine

## 2012-12-21 NOTE — Telephone Encounter (Signed)
Spoke with pt and notified no samples of any rescue inhalers available at this time I offered to call in rx and she declined Nothing further needed per pt

## 2013-01-04 ENCOUNTER — Telehealth: Payer: Self-pay | Admitting: Critical Care Medicine

## 2013-01-04 NOTE — Telephone Encounter (Signed)
I spoke with pt and advised her we did not have any samples at this time of ventolin/proair. Nothing further was needed

## 2013-01-06 ENCOUNTER — Telehealth: Payer: Self-pay | Admitting: Critical Care Medicine

## 2013-01-06 MED ORDER — ALBUTEROL SULFATE HFA 108 (90 BASE) MCG/ACT IN AERS: 2.0000 | INHALATION_SPRAY | Freq: Four times a day (QID) | RESPIRATORY_TRACT | Status: DC | PRN

## 2013-01-06 NOTE — Telephone Encounter (Signed)
1 sample left upfront for pick up. Nothing further was needed

## 2013-01-19 ENCOUNTER — Other Ambulatory Visit: Payer: Self-pay | Admitting: Internal Medicine

## 2013-02-15 ENCOUNTER — Ambulatory Visit: Payer: Medicare Other | Admitting: Critical Care Medicine

## 2013-02-16 ENCOUNTER — Ambulatory Visit (INDEPENDENT_AMBULATORY_CARE_PROVIDER_SITE_OTHER): Payer: Medicare Other | Admitting: Critical Care Medicine

## 2013-02-16 ENCOUNTER — Encounter: Payer: Self-pay | Admitting: Critical Care Medicine

## 2013-02-16 VITALS — BP 120/82 | HR 77 | Temp 98.3°F | Ht 62.5 in | Wt 169.0 lb

## 2013-02-16 DIAGNOSIS — J449 Chronic obstructive pulmonary disease, unspecified: Secondary | ICD-10-CM

## 2013-02-16 NOTE — Patient Instructions (Signed)
No change in medications. Return in         4 months 

## 2013-02-16 NOTE — Assessment & Plan Note (Signed)
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

## 2013-02-16 NOTE — Progress Notes (Signed)
Subjective:    Patient ID: Kaitlin Henderson, female    DOB: 30-Sep-1940, 72 y.o.   MRN: [id number]  HPI 72 y.o.WF copd Gold  II/  D quit smoking 2008  02/16/2013 Chief Complaint  Patient presents with  . COPD    Breathing is unchanged. Reports SOB. Denies chest pain, chest tightness, coughing or wheezing.  Pt held off on knee surgery.  Not much pain, ibuprofen helps Dyspnea is about the same.  Harder to move around.   Pt denies any significant sore throat, nasal congestion or excess secretions, fever, chills, sweats, unintended weight loss, pleurtic or exertional chest pain, orthopnea PND, or leg swelling Pt denies any increase in rescue therapy over baseline, denies waking up needing it or having any early am or nocturnal exacerbations of coughing/wheezing/or dyspnea. Pt also denies any obvious fluctuation in symptoms with  weather or environmental change or other alleviating or aggravating factors    Review of Systems Constitutional:   No  weight loss, night sweats,  Fevers, chills, fatigue, lassitude. HEENT:   No headaches,  Difficulty swallowing,  Tooth/dental problems,  Sore throat,                No sneezing, itching, ear ache, nasal congestion, post nasal drip,   CV:  No chest pain,  Orthopnea, PND, swelling in lower extremities, anasarca, dizziness, palpitations  GI  No heartburn, indigestion, abdominal pain, nausea, vomiting, diarrhea, change in bowel habits, loss of appetite  Resp: notes  shortness of breath with exertion not  at rest.  No excess mucus, no productive cough,  Notes non-productive cough,  No coughing up of blood.  No change in color of mucus.  No wheezing.  No chest wall deformity  Skin: no rash or lesions.  GU: no dysuria, change in color of urine, no urgency or frequency.  No flank pain.  MS:  No joint pain or swelling.  No decreased range of motion.  No back pain.  Psych:  No change in mood or affect. No depression or anxiety.  No memory loss.      Objective:   Physical Exam Filed Vitals:   02/16/13 1443  BP: 120/82  Pulse: 77  Temp: 98.3 F (36.8 C)  TempSrc: Oral  Height: 5' 2.5" (1.588 m)  Weight: 169 lb (76.658 kg)  SpO2: 93%    Gen: Pleasant, well-nourished, in no distress,  normal affect  ENT: No lesions,  mouth clear,  oropharynx clear, no postnasal drip  Neck: No JVD, no TMG, no carotid bruits  Lungs: No use of accessory muscles, no dullness to percussion, distant BS, no wheezes  Cardiovascular: RRR, heart sounds normal, no murmur or gallops, no peripheral edema  Abdomen: soft and NT, no HSM,  BS normal  Musculoskeletal: No deformities, no cyanosis or clubbing  Neuro: alert, non focal  Skin: Warm, no lesions or rashes  No results found.        Assessment & Plan:   C O P D Gold D 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   Updated Medication List Outpatient Encounter Prescriptions as of 02/16/2013  Medication Sig Dispense Refill  . albuterol (PROAIR HFA) 108 (90 BASE) MCG/ACT inhaler Inhale 2 puffs into the lungs every 6 (six) hours as needed for wheezing.  1 Inhaler  0  . albuterol (PROVENTIL) (5 MG/ML) 0.5% nebulizer solution Take 2.5 mg by nebulization  3 (three) times daily.      [name] ALPRAZolam (XANAX) 0.5 MG tablet Take 0.5-1 tablets (0.25-0.5 mg total) by mouth 2 (two) times daily as needed.  60 tablet  3  . aspirin 81 MG tablet Take 81 mg by mouth daily.        . benzonatate (TESSALON) 100 MG capsule Take 100 mg by mouth every 8 (eight) hours as needed. For cough      . BESIVANCE 0.6 % SUSP Place 1 drop into the right eye every 2 (two) hours.      . DUREZOL 0.05 % EMUL Apply to right eye twice daily      . furosemide (LASIX) 40 MG tablet Take 1 tablet (40 mg total) by mouth 2 (two) times daily.  60 tablet  5  . ILEVRO 0.3 % SUSP Place 1 drop into the right eye every 2  (two) hours.      [name] ipratropium (ATROVENT) 0.02 % nebulizer solution Take 0.5 mg by nebulization 3 (three) times daily.      . metFORMIN (GLUCOPHAGE-XR) 500 MG 24 hr tablet TAKE 1 TABLET BY MOUTH TWICE A DAY  60 tablet  5  . NEXIUM 40 MG capsule TAKE 1 CAPSULE BY MOUTH ONCE A DAY  30 capsule  5  . ofloxacin (OCUFLOX) 0.3 % ophthalmic solution Place 1 drop into the right eye daily. Use prior to eye surgery as directed      . potassium chloride SA (KLOR-CON M20) 20 MEQ tablet Take 1 tablet (20 mEq total) by mouth 2 (two) times daily.  60 tablet  5  . pravastatin (PRAVACHOL) 80 MG tablet Take 1 tablet (80 mg total) by mouth daily.  30 tablet  5  . prednisoLONE acetate (PRED FORTE) 1 % ophthalmic suspension Place 1 drop into the right eye 2 (two) times daily. Use prior to eye surgery as directed      . predniSONE (DELTASONE) 10 MG tablet Take 1 tablet (10 mg total) by mouth daily.  30 tablet  5  . traMADol (ULTRAM) 50 MG tablet Take 1 tablet (50 mg total) by mouth every 6 (six) hours as needed for pain.  15 tablet  0  . [DISCONTINUED] albuterol (PROVENTIL HFA;VENTOLIN HFA) 108 (90 BASE) MCG/ACT inhaler Inhale 2 puffs into the lungs every 6 (six) hours as needed for wheezing.  1 Inhaler  0   No facility-administered encounter medications on file as of 02/16/2013.

## 2013-02-23 ENCOUNTER — Ambulatory Visit: Payer: Medicare Other | Admitting: Internal Medicine

## 2013-02-26 ENCOUNTER — Telehealth: Payer: Self-pay

## 2013-02-26 MED ORDER — LANCETS MISC: Status: DC

## 2013-02-26 NOTE — Telephone Encounter (Signed)
Patient called stating she only had 2 lancets left. Routed new script to pharmacy as she requested.

## 2013-03-01 ENCOUNTER — Ambulatory Visit: Payer: Medicare Other | Admitting: Internal Medicine

## 2013-03-01 DIAGNOSIS — Z0289 Encounter for other administrative examinations: Secondary | ICD-10-CM

## 2013-03-03 ENCOUNTER — Telehealth: Payer: Self-pay | Admitting: Critical Care Medicine

## 2013-03-03 MED ORDER — ALBUTEROL SULFATE HFA 108 (90 BASE) MCG/ACT IN AERS: 2.0000 | INHALATION_SPRAY | Freq: Four times a day (QID) | RESPIRATORY_TRACT | Status: DC | PRN

## 2013-03-03 NOTE — Telephone Encounter (Signed)
Called spoke with patient who reported that she was told [facility] [address] that her insurance will pay for 2 inhalers per month rather than 1; she is on a fixed income and this would help her out greatly.  Advised pt that though the pharmacy told her this, we cannot gaurantee that insurance will pay for 2 inhalers.  Pt verbalized her understanding; [facility].  Called [facility] [address] and spoke with Crystal who reported that insurance will typically pay for 2 inhalers for someone going to school (for example) so that the pt can have a rescue with them and at home.  Crystal cannot guarantee payment by insurance either but reported the pharmacy will simply dispense 1 inhaler if this is the case.  Rx for proair hfa #2 sent to pharmacy.  Last ov w/ PW 6.17.14 Nothing further needed; will sign off.

## 2013-03-09 ENCOUNTER — Ambulatory Visit (INDEPENDENT_AMBULATORY_CARE_PROVIDER_SITE_OTHER): Payer: Medicare Other | Admitting: Internal Medicine

## 2013-03-09 ENCOUNTER — Other Ambulatory Visit (INDEPENDENT_AMBULATORY_CARE_PROVIDER_SITE_OTHER): Payer: Medicare Other

## 2013-03-09 ENCOUNTER — Encounter: Payer: Self-pay | Admitting: Internal Medicine

## 2013-03-09 VITALS — BP 104/60 | HR 90 | Temp 98.0°F | Wt 166.0 lb

## 2013-03-09 DIAGNOSIS — E119 Type 2 diabetes mellitus without complications: Secondary | ICD-10-CM

## 2013-03-09 DIAGNOSIS — J9610 Chronic respiratory failure, unspecified whether with hypoxia or hypercapnia: Secondary | ICD-10-CM

## 2013-03-09 DIAGNOSIS — F329 Major depressive disorder, single episode, unspecified: Secondary | ICD-10-CM

## 2013-03-09 DIAGNOSIS — E785 Hyperlipidemia, unspecified: Secondary | ICD-10-CM

## 2013-03-09 LAB — LIPID PANEL
Cholesterol: 239 mg/dL — ABNORMAL HIGH (ref 0–200)
HDL: 64.6 mg/dL (ref >39.00)

## 2013-03-09 LAB — HEMOGLOBIN A1C: Hgb A1c MFr Bld: 6.3 % (ref 4.6–6.5)

## 2013-03-09 LAB — MICROALBUMIN / CREATININE URINE RATIO
Creatinine,U: 139.4 mg/dL
Microalb Creat Ratio: 0.7 mg/g (ref 0.0–30.0)

## 2013-03-09 NOTE — Assessment & Plan Note (Signed)
On statin, recommended dose increase 10/2012 to max prava recheck labs now and titrate as needed for LDL goal <100

## 2013-03-09 NOTE — Assessment & Plan Note (Signed)
GOLD stage D - advanced dz s/p hosp 01/2012 with PNA, complicated by NSTEMI (med mgmt) Currently at baseline Chronic pred/O2, Continue nebs Previously with home hospice for same, dismissed 2012 because she was doing well Mgmt as per pulm (wright)

## 2013-03-09 NOTE — Patient Instructions (Signed)
It was good to see you today. We have reviewed your prior records including labs and tests today Test(s) ordered today. Your results will be released to MyChart (or called to you) after review, usually within 72hours after test completion. If any changes need to be made, you will be notified at that same time. Medications reviewed, no changes at this time Please schedule followup in 4 months for diabetes check, call sooner if problems.

## 2013-03-09 NOTE — Progress Notes (Signed)
Subjective:    Patient ID: Kaitlin Henderson, female    DOB: 07/11/1941, 72 y.o.   MRN: [id number]  HPI  Here for follow up - reviewed chronic medical issues:  DM2 - dx 03/2010 by routine pulm labs; started on metformin + glimepiride 03/2010; reports compliance with ongoing medical treatment and no changes in medication dose or frequency. denies adverse side effects related to current therapy. no hypoglycemic events or symptoms - checks sugars 2x/day - home cbg log reviewed - range 101-170s, lowest in AM   COPD - advanced dz, O2-dep; hospitalization summer 2013 with PNA - follows with pulm regularly; previously enrolled with hospice for same since 06/2009 through summer 2012; chronic pred + cont O2 use, inhaler changes fall 2012 with slightly improved symptoms - daily dyspnea on exertion but no cough or flares at this time - reports compliance with ongoing medical treatment and no changes in medication dose or frequency. denies adverse side effects related to current therapy.   breast cancer history. dx 1998 s/p chemo and xrt - s/p r mastect for same -   Anxiety/depression- largely related to trouble breathing and fear of being alone - "i have no one" since mom & spouse expired and children are "distantly involved" - uses BZ as needed - started SSRI 09/2010- then mom passed 01/2011, so she stopped medication - denies SI/HI  - not interesting in resuming SSRI at this time   Past Medical History  Diagnosis Date  . Breast cancer, right breast 1998    s/p chemo & XRT, right mastectomy  . OBESITY   . CORONARY ARTERY DISEASE   . APHTHOUS STOMATITIS   . DEPRESSION     started sertraline 09/2010  . DIABETES, TYPE 2 dx 03/2010  . HYPERLIPIDEMIA   . Hypothyroidism   . C O P D     chronic O2 3LPM [place]  . Essential tremor   . NSTEMI (non-ST elevated myocardial infarction) 01/2012    med mgmt     Review of Systems  Constitutional: Positive for fatigue. Negative for fever.  Respiratory: Positive for  shortness of breath (chronic DOE unchanged). Negative for cough and wheezing.   Cardiovascular: Negative for chest pain.  Genitourinary: Negative for dysuria and frequency.  Neurological: Negative for headaches.       Objective:   Physical Exam BP 104/60  Pulse 90  Temp(Src) 98 F (36.7 C) (Oral)  Wt 166 lb (75.297 kg)  BMI 29.86 kg/m2  SpO2 91% Wt Readings from Last 3 Encounters:  03/09/13 166 lb (75.297 kg)  02/16/13 169 lb (76.658 kg)  12/02/12 157 lb (71.215 kg)   Constitutional: She is obese, but appears well-developed and well-nourished. No distress. Neck: Normal range of motion. Neck supple. No JVD present. No thyromegaly present.  Cardiovascular: Normal rate, regular rhythm and normal heart sounds.  No murmur heard. No BLE edema. Pulmonary/Chest: Effort normal at rest: breath sounds diminished at bases. She has no wheezes.  Skin: thinning hair but no alopecia - chronically pale but without change -  skin is warm and dry. No rash noted. No erythema.  Neuro: R hand active tremor without change Psychiatric: She has a dysphoric mood with overlapping mild anxiety. despondent about sad events in her past. Her behavior is normal. Judgment and thought content normal.      Lab Results  Component Value Date   WBC 13.3* 01/27/2012   HGB 14.9 01/27/2012   HCT 43.8 01/27/2012   PLT 285 01/27/2012   CHOL  232* 10/08/2012   TRIG 149.0 10/08/2012   HDL 58.70 10/08/2012   LDLDIRECT 144.7 10/08/2012   ALT 23 09/25/2010   AST 22 09/25/2010   NA 140 10/08/2012   K 4.0 10/08/2012   CL 100 10/08/2012   CREATININE 1.0 10/08/2012   BUN 12 10/08/2012   CO2 31 10/08/2012   TSH 0.71 06/08/2012   HGBA1C 6.5 10/08/2012   MICROALBUR 0.3 11/25/2011   Assessment & Plan:   See problem list. Medications and labs reviewed today.

## 2013-03-09 NOTE — Assessment & Plan Note (Signed)
Was improved with addition of SSRI 10-02-2010 - but worse following death of mom 2011/01/31 -  exacerbated by hospitalization and acute illness summer 2013 Now exacerbated by "breakup" with boyfriend 01/2013 Uses BZ prn to control anxiety overlap -declines to resume SSRI Continue present plan and medications. Support offered

## 2013-03-09 NOTE — Assessment & Plan Note (Signed)
Complicated by neuropathy The current medical regimen is effective;  continue present plan and medications.  Metformin daily with prn amaryl for cbg >150 - no change in chronic steroid dose Home cbg 95-150s - reasonable control for state of other health issues Also on ASA 81, statin -  Lab Results  Component Value Date   HGBA1C 6.5 10/08/2012

## 2013-03-10 ENCOUNTER — Telehealth: Payer: Self-pay | Admitting: *Deleted

## 2013-03-10 MED ORDER — ATORVASTATIN CALCIUM 40 MG PO TABS: 40.0000 mg | ORAL_TABLET | Freq: Every day | ORAL | Status: DC

## 2013-03-10 NOTE — Telephone Encounter (Signed)
Change pravastatin 80 to atorvastatin 40, take once daily, preferably bedtime - erx done

## 2013-03-10 NOTE — Telephone Encounter (Signed)
Pt return call bck concerning her labs. i verified with pt on the dosage on her pravastatin. Pt has been taking the 80 mg daily. Inform pt will let md know & will call her back to let her know what md change med to...Raechel Chute

## 2013-03-10 NOTE — Telephone Encounter (Signed)
Notified pt with md response.../lmb 

## 2013-03-11 ENCOUNTER — Telehealth: Payer: Self-pay | Admitting: *Deleted

## 2013-03-11 NOTE — Telephone Encounter (Signed)
Notified pt with md response. Pt also states md was going to check her magnesium. Per chart magnesium was not check. Pt is wanting to have magnesium check...lmb

## 2013-03-11 NOTE — Telephone Encounter (Signed)
Notified pt with md response.../lmb 

## 2013-03-11 NOTE — Telephone Encounter (Signed)
Pt is wanting to know is it ok to take fish oil to help with cholesterol. If ok what dosage md recommends...Raechel Chute

## 2013-03-11 NOTE — Telephone Encounter (Signed)
Fish oil is unlikely to make significant changes in cholesterol management Okay to take 1000 mg of omega-3 fish oil daily if desired, but I recommend this in addition to change in statin as prescribed yesterday

## 2013-03-11 NOTE — Telephone Encounter (Signed)
I think we discussed taking magnesium, but not "labs" for Mg -  We can check Mag next time if still desired

## 2013-05-06 ENCOUNTER — Telehealth: Payer: Self-pay | Admitting: Critical Care Medicine

## 2013-05-06 NOTE — Telephone Encounter (Signed)
[facility] They stated pt is going through 2 rescue inhalers every 2 1/2 weeks. She told them she was using 12-14 puffs daily. Pt insurance finally stated they will no longer allow her to get 2 inhalers every 2 weeks and needs to be on maintenance inhaler to help cut down how many times she is needing her rescue inhaler. Please advise Dr. Delford Field thanks  Allergies  Allergen Reactions  . Codeine     REACTION: makes pt pass out  . Levaquin [Levofloxacin In D5w] Other (See Comments)    redness  . Sulfonamide Derivatives     REACTION: edema

## 2013-05-06 NOTE — Telephone Encounter (Signed)
She needs an ov to regroup

## 2013-05-06 NOTE — Telephone Encounter (Signed)
i spoke with pt. She scheduled to come in and see PW in the am at 11:15. Nothing further needed

## 2013-05-07 ENCOUNTER — Ambulatory Visit: Payer: Medicare Other | Admitting: Critical Care Medicine

## 2013-05-07 ENCOUNTER — Ambulatory Visit (INDEPENDENT_AMBULATORY_CARE_PROVIDER_SITE_OTHER): Payer: Medicare Other | Admitting: Critical Care Medicine

## 2013-05-07 ENCOUNTER — Encounter: Payer: Self-pay | Admitting: Critical Care Medicine

## 2013-05-07 VITALS — BP 112/60 | HR 107 | Temp 98.1°F | Ht 62.5 in | Wt 164.0 lb

## 2013-05-07 DIAGNOSIS — J438 Other emphysema: Secondary | ICD-10-CM

## 2013-05-07 DIAGNOSIS — Z23 Encounter for immunization: Secondary | ICD-10-CM

## 2013-05-07 DIAGNOSIS — J449 Chronic obstructive pulmonary disease, unspecified: Secondary | ICD-10-CM

## 2013-05-07 DIAGNOSIS — J4489 Other specified chronic obstructive pulmonary disease: Secondary | ICD-10-CM

## 2013-05-07 DIAGNOSIS — J439 Emphysema, unspecified: Secondary | ICD-10-CM

## 2013-05-07 MED ORDER — ALBUTEROL SULFATE HFA 108 (90 BASE) MCG/ACT IN AERS: 2.0000 | INHALATION_SPRAY | Freq: Four times a day (QID) | RESPIRATORY_TRACT | Status: DC | PRN

## 2013-05-07 MED ORDER — ALBUTEROL SULFATE (5 MG/ML) 0.5% IN NEBU: 2.5000 mg | INHALATION_SOLUTION | Freq: Three times a day (TID) | RESPIRATORY_TRACT | Status: DC

## 2013-05-07 MED ORDER — PREDNISONE 10 MG PO TABS: ORAL_TABLET | ORAL | Status: DC

## 2013-05-07 MED ORDER — IPRATROPIUM-ALBUTEROL 0.5-2.5 (3) MG/3ML IN SOLN
3.0000 mL | Freq: Once | RESPIRATORY_TRACT | Status: AC
  Administered 2013-05-07: 3 mL via RESPIRATORY_TRACT

## 2013-05-07 MED ORDER — ALPRAZOLAM 0.5 MG PO TABS: 0.2500 mg | ORAL_TABLET | Freq: Two times a day (BID) | ORAL | Status: DC | PRN

## 2013-05-07 NOTE — Patient Instructions (Addendum)
Xanax , albuterol inhaler/nebulizer refilled Use albuterol in nebulizer at least three times daily Take prednisone 10mg  Take 4 for three days 3 for three days 2 for three days then one daily Stay on oxygen Return 2 weeks for recheck with tammy parrett Flu vaccine and pneumovax were given

## 2013-05-07 NOTE — Progress Notes (Signed)
Subjective:    Patient ID: Kaitlin Henderson, female    DOB: [birth year], 72 y.o.   MRN: [id number]  HPI  72 y.o. .WF copd Gold  II/  D quit smoking 2008  05/07/2013  Chief Complaint  Patient presents with  . Acute Visit    Increased SOB at rest and with any activity - has worsened with the heat.  Some wheezing qhs and cough qhs with gray mucus.  No chest tightness or chest pain.  Patient notes significant shortness of breath with rest and any activity. This patient has continued to decline and is overusing her rescue inhaler at this point the patient unable to afford the nebulized medication and had run out of but is using oxygen therapy Her cough is productive of thick gray mucus and there is increased wheezing noted     Review of Systems  Constitutional:   No  weight loss, night sweats,  Fevers, chills, fatigue, lassitude. HEENT:   No headaches,  Difficulty swallowing,  Tooth/dental problems,  Sore throat,                No sneezing, itching, ear ache, nasal congestion, post nasal drip,   CV:  No chest pain,  Orthopnea, PND, swelling in lower extremities, anasarca, dizziness, palpitations  GI  No heartburn, indigestion, abdominal pain, nausea, vomiting, diarrhea, change in bowel habits, loss of appetite  Resp: notes  shortness of breath with exertion not  at rest.  No excess mucus, notes productive cough,  Notes non-productive cough,  No coughing up of blood.  No change in color of mucus.  No wheezing.  No chest wall deformity  Skin: no rash or lesions.  GU: no dysuria, change in color of urine, no urgency or frequency.  No flank pain.  MS:  No joint pain or swelling.  No decreased range of motion.  No back pain.  Psych:  No change in mood or affect. No depression or anxiety.  No memory loss.     Objective:   Physical Exam  Filed Vitals:   05/07/13 1110  BP: 112/60  Pulse: 107  Temp: 98.1 F (36.7 C)  TempSrc: Oral  Height: 5' 2.5" (1.588 m)  Weight: 164 lb (74.39  kg)  SpO2: 93%    Gen: Pleasant, well-nourished, in no distress,  normal affect  ENT: No lesions,  mouth clear,  oropharynx clear, no postnasal drip  Neck: No JVD, no TMG, no carotid bruits  Lungs: No use of accessory muscles, no dullness to percussion, distant BS, expired wheezes   Cardiovascular: RRR, heart sounds normal, no murmur or gallops, no peripheral edema  Abdomen: soft and NT, no HSM,  BS normal  Musculoskeletal: No deformities, no cyanosis or clubbing  Neuro: alert, non focal  Skin: Warm, no lesions or rashes  No results found. Spirometry: 05/07/2013: FEV1 50% predicted FVC 70% predicted FEV1 to FVC ratio 54% predicted    Assessment & Plan:   C O P D Gold D COPD gold stage D  with mild acute exacerbation Albuterol overuse Plan Xanax , albuterol inhaler/nebulizer refilled Use albuterol in nebulizer at least three times daily Take prednisone 10mg  Take 4 for three days 3 for three days 2 for three days then one daily Stay on oxygen Return 2 weeks for recheck with tammy parrett Flu vaccine and pneumovax were given     Updated Medication List Outpatient Encounter Prescriptions as of 05/07/2013  Medication Sig Dispense Refill  . ALPRAZolam (XANAX) 0.5 MG tablet  Take 0.5-1 tablets (0.25-0.5 mg total) by mouth 2 (two) times daily as needed.  60 tablet  3  . aspirin 81 MG tablet Take 81 mg by mouth daily.        . benzonatate (TESSALON) 100 MG capsule Take 100 mg by mouth every 8 (eight) hours as needed. For cough      . furosemide (LASIX) 40 MG tablet Take 1 tablet (40 mg total) by mouth 2 (two) times daily.  60 tablet  5  . Lancets MISC Use as directed to test blood sugar twice daily. Dx: 250.00  100 each  0  . metFORMIN (GLUCOPHAGE-XR) 500 MG 24 hr tablet TAKE 1 TABLET BY MOUTH TWICE A DAY  60 tablet  5  . NEXIUM 40 MG capsule TAKE 1 CAPSULE BY MOUTH ONCE A DAY  30 capsule  5  . potassium chloride SA (KLOR-CON M20) 20 MEQ tablet Take 1 tablet (20 mEq total) by  mouth 2 (two) times daily.  60 tablet  5  . pravastatin (PRAVACHOL) 80 MG tablet Take 1 tablet by mouth daily.      . predniSONE (DELTASONE) 10 MG tablet Take 4 for three days 3 for three days 2 for three days then one daily  60 tablet  5  . traMADol (ULTRAM) 50 MG tablet Take 1 tablet (50 mg total) by mouth every 6 (six) hours as needed for pain.  15 tablet  0  . [DISCONTINUED] ALPRAZolam (XANAX) 0.5 MG tablet Take 0.5-1 tablets (0.25-0.5 mg total) by mouth 2 (two) times daily as needed.  60 tablet  3  . [DISCONTINUED] predniSONE (DELTASONE) 10 MG tablet Take 1 tablet (10 mg total) by mouth daily.  30 tablet  5  . albuterol (PROAIR HFA) 108 (90 BASE) MCG/ACT inhaler Inhale 2 puffs into the lungs every 6 (six) hours as needed for wheezing.  2 Inhaler  3  . albuterol (PROVENTIL) (5 MG/ML) 0.5% nebulizer solution Take 0.5 mLs (2.5 mg total) by nebulization 3 (three) times daily.  120 mL  6  . atorvastatin (LIPITOR) 40 MG tablet Take 1 tablet (40 mg total) by mouth daily.  90 tablet  3  . [DISCONTINUED] albuterol (PROAIR HFA) 108 (90 BASE) MCG/ACT inhaler Inhale 2 puffs into the lungs every 6 (six) hours as needed for wheezing.  2 Inhaler  3  . [DISCONTINUED] albuterol (PROVENTIL) (5 MG/ML) 0.5% nebulizer solution Take 2.5 mg by nebulization 3 (three) times daily.      . [DISCONTINUED] BESIVANCE 0.6 % SUSP Place 1 drop into the right eye every 2 (two) hours.      . [DISCONTINUED] DUREZOL 0.05 % EMUL Apply to right eye twice daily      . [DISCONTINUED] ILEVRO 0.3 % SUSP Place 1 drop into the right eye every 2 (two) hours.      . [DISCONTINUED] ipratropium (ATROVENT) 0.02 % nebulizer solution Take 0.5 mg by nebulization 3 (three) times daily.      . [DISCONTINUED] ofloxacin (OCUFLOX) 0.3 % ophthalmic solution Place 1 drop into the right eye daily. Use prior to eye surgery as directed      . [DISCONTINUED] prednisoLONE acetate (PRED FORTE) 1 % ophthalmic suspension Place 1 drop into the right eye 2 (two)  times daily. Use prior to eye surgery as directed      . [EXPIRED] ipratropium-albuterol (DUONEB) 0.5-2.5 (3) MG/3ML nebulizer solution 3 mL        No facility-administered encounter medications on file as of 05/07/2013.

## 2013-05-07 NOTE — Assessment & Plan Note (Signed)
COPD gold stage D  with mild acute exacerbation Albuterol overuse Plan Xanax , albuterol inhaler/nebulizer refilled Use albuterol in nebulizer at least three times daily Take prednisone 10mg  Take 4 for three days 3 for three days 2 for three days then one daily Stay on oxygen Return 2 weeks for recheck with tammy parrett Flu vaccine and pneumovax were given

## 2013-05-19 ENCOUNTER — Encounter: Payer: Self-pay | Admitting: Adult Health

## 2013-05-19 ENCOUNTER — Telehealth: Payer: Self-pay | Admitting: Critical Care Medicine

## 2013-05-19 ENCOUNTER — Ambulatory Visit (INDEPENDENT_AMBULATORY_CARE_PROVIDER_SITE_OTHER): Payer: Medicare Other | Admitting: Adult Health

## 2013-05-19 VITALS — BP 114/66 | HR 73 | Temp 98.3°F | Ht 62.5 in | Wt 171.0 lb

## 2013-05-19 DIAGNOSIS — J439 Emphysema, unspecified: Secondary | ICD-10-CM

## 2013-05-19 DIAGNOSIS — J438 Other emphysema: Secondary | ICD-10-CM

## 2013-05-19 MED ORDER — FUROSEMIDE 40 MG PO TABS: 40.0000 mg | ORAL_TABLET | Freq: Two times a day (BID) | ORAL | Status: DC

## 2013-05-19 NOTE — Assessment & Plan Note (Signed)
Recent exacerbation now resolved  Back to baseline   Plan  No change in medications. Return in     4 months with Dr. Delford Field   Keep up good work.

## 2013-05-19 NOTE — Telephone Encounter (Signed)
Rx has been sent in. Pt is aware. 

## 2013-05-19 NOTE — Progress Notes (Signed)
  Subjective:    Patient ID: Kaitlin Henderson, female    DOB: 1941-04-08, 72 y.o.   MRN: [id number]  HPI 20 WF copd Gold  II/  D quit smoking 2008  05/19/2013 Follow up  Pt returns for a 2 week follow up  COPD - reports breathing is improved since last ov.  no new complaints Was treated for a COPD flare last ov with  Prednisone taper She is feeling much better. No hemoptysis, chest pain, orthopnea, edema .    Review of Systems Constitutional:   No  weight loss, night sweats,  Fevers, chills,  +fatigue, or  lassitude.  HEENT:   No headaches,  Difficulty swallowing,  Tooth/dental problems, or  Sore throat,                No sneezing, itching, ear ache, nasal congestion, post nasal drip,   CV:  No chest pain,  Orthopnea, PND, swelling in lower extremities, anasarca, dizziness, palpitations, syncope.   GI  No heartburn, indigestion, abdominal pain, nausea, vomiting, diarrhea, change in bowel habits, loss of appetite, bloody stools.   Resp:.  No wheezing.  No chest wall deformity  Skin: no rash or lesions.  GU: no dysuria, change in color of urine, no urgency or frequency.  No flank pain, no hematuria   MS:  No joint pain or swelling.  No decreased range of motion.  No back pain.  Psych:  No change in mood or affect. No depression or anxiety.  No memory loss.          Objective:   Physical Exam GEN: A/Ox3; pleasant , NAD, elderly   HEENT:  [place]/AT,  EACs-clear, TMs-wnl, NOSE-clear, THROAT-clear, no lesions, no postnasal drip or exudate noted.   NECK:  Supple w/ fair ROM; no JVD; normal carotid impulses w/o bruits; no thyromegaly or nodules palpated; no lymphadenopathy.  RESP  Diminished BS in bases ; w/o, wheezes/ rales/ or rhonchi.no accessory muscle use, no dullness to percussion  CARD:  RRR, no m/r/g  , no peripheral edema, pulses intact, no cyanosis or clubbing.  GI:   Soft & nt; nml bowel sounds; no organomegaly or masses detected.  Musco: Warm bil, no deformities or  joint swelling noted.   Neuro: alert, no focal deficits noted.    Skin: Warm, no lesions or rashes         Assessment & Plan:

## 2013-05-19 NOTE — Patient Instructions (Addendum)
No change in medications. Return in     4 months with Dr. Delford Field   Keep up good work.

## 2013-05-21 ENCOUNTER — Other Ambulatory Visit: Payer: Self-pay | Admitting: Critical Care Medicine

## 2013-06-01 ENCOUNTER — Other Ambulatory Visit: Payer: Self-pay | Admitting: Critical Care Medicine

## 2013-06-01 ENCOUNTER — Telehealth: Payer: Self-pay | Admitting: Critical Care Medicine

## 2013-06-01 NOTE — Telephone Encounter (Signed)
Pt states she is getting fed up with the whole Rx situation.  Pt states that her pharmacy has been callin' [place] for a refill & we aren't answerin'.  Pt states that she doesn't live around the corner from a pharmacy.  Wants this called in NOW & asks to be reached at [phone].  Antionette Fairy

## 2013-06-01 NOTE — Telephone Encounter (Signed)
According to epic and rx was sent on 05-07-13 for # 2 with 3 refills. I called pharmacy and asked if they received this and was advised that it was placed on hold and they will fill it now. Carron Curie, CMA

## 2013-06-02 NOTE — Telephone Encounter (Signed)
[facility] # 2 x 3. [facility], spoke with Maureen Ralphs.  Was advised they did receive the rx from Sept 5. They will get medication ready for pt. Nothing further needed.

## 2013-06-18 ENCOUNTER — Ambulatory Visit (INDEPENDENT_AMBULATORY_CARE_PROVIDER_SITE_OTHER): Payer: Medicare Other | Admitting: Critical Care Medicine

## 2013-06-18 ENCOUNTER — Encounter: Payer: Self-pay | Admitting: Critical Care Medicine

## 2013-06-18 VITALS — BP 124/70 | HR 91 | Temp 98.0°F | Ht 62.5 in | Wt 176.6 lb

## 2013-06-18 DIAGNOSIS — J438 Other emphysema: Secondary | ICD-10-CM

## 2013-06-18 DIAGNOSIS — J439 Emphysema, unspecified: Secondary | ICD-10-CM

## 2013-06-18 MED ORDER — ATORVASTATIN CALCIUM 40 MG PO TABS: 40.0000 mg | ORAL_TABLET | Freq: Every day | ORAL | Status: DC

## 2013-06-18 NOTE — Patient Instructions (Addendum)
No change in medications except as below START lipitor STOP  pravachol Return in      3 months

## 2013-06-18 NOTE — Progress Notes (Signed)
Subjective:    Patient ID: Kaitlin Henderson, female    DOB: Aug 17, 1941, 72 y.o.   MRN: [id number]  HPI  86 WF copd Gold  II/  D quit smoking 2008  9/17 Follow up  Pt returns for a 2 week follow up  COPD - reports breathing is improved since last ov.  no new complaints Was treated for a COPD flare last ov with  Prednisone taper She is feeling much better. No hemoptysis, chest pain, orthopnea, edema .   06/18/2013 Chief Complaint  Patient presents with  . Follow-up    Pt c/o some dry cough.  No SOB, tightness, postnasal drip.    Now a dry cough.  Dyspnea is at baseline. Does have some pndrip. No chest pain.  No fever   Review of Systems  Constitutional:   No  weight loss, night sweats,  Fevers, chills,  +fatigue, or  lassitude.  HEENT:   No headaches,  Difficulty swallowing,  Tooth/dental problems, or  Sore throat,                No sneezing, itching, ear ache, nasal congestion, post nasal drip,   CV:  No chest pain,  Orthopnea, PND, swelling in lower extremities, anasarca, dizziness, palpitations, syncope.   GI  No heartburn, indigestion, abdominal pain, nausea, vomiting, diarrhea, change in bowel habits, loss of appetite, bloody stools.   Resp:.  No wheezing.  No chest wall deformity  Skin: no rash or lesions.  GU: no dysuria, change in color of urine, no urgency or frequency.  No flank pain, no hematuria   MS:  No joint pain or swelling.  No decreased range of motion.  No back pain.  Psych:  No change in mood or affect. No depression or anxiety.  No memory loss.     Objective:   Physical Exam BP 124/70  Pulse 91  Temp(Src) 98 F (36.7 C) (Oral)  Ht 5' 2.5" (1.588 m)  Wt 176 lb 9.6 oz (80.105 kg)  BMI 31.77 kg/m2  SpO2 91%  GEN: A/Ox3; pleasant , NAD, elderly   HEENT:  [place]/AT,  EACs-clear, TMs-wnl, NOSE-clear, THROAT-clear, no lesions, no postnasal drip or exudate noted.   NECK:  Supple w/ fair ROM; no JVD; normal carotid impulses w/o bruits; no thyromegaly  or nodules palpated; no lymphadenopathy.  RESP  Diminished BS in bases ; w/o, wheezes/ rales/ or rhonchi.no accessory muscle use, no dullness to percussion  CARD:  RRR, no m/r/g  , no peripheral edema, pulses intact, no cyanosis or clubbing.  GI:   Soft & nt; nml bowel sounds; no organomegaly or masses detected.  Musco: Warm bil, no deformities or joint swelling noted.   Neuro: alert, no focal deficits noted.    Skin: Warm, no lesions or rashes     Assessment & Plan:   COPD with emphysema, Gold D  Stable copd Gold D Note pt taking pravachol and lipitor together Plan Stop pravachol Stay on lipitor Cont neb med   Updated Medication List Outpatient Encounter Prescriptions as of 06/18/2013  Medication Sig Dispense Refill  . albuterol (PROAIR HFA) 108 (90 BASE) MCG/ACT inhaler Inhale 2 puffs into the lungs every 6 (six) hours as needed for wheezing.  2 Inhaler  3  . albuterol (PROVENTIL) (5 MG/ML) 0.5% nebulizer solution Take 0.5 mLs (2.5 mg total) by nebulization 3 (three) times daily.  120 mL  6  . ALPRAZolam (XANAX) 0.5 MG tablet Take 0.5-1 tablets (0.25-0.5 mg total) by mouth 2 (  two) times daily as needed.  60 tablet  3  . aspirin 81 MG tablet Take 81 mg by mouth daily.        [name] atorvastatin (LIPITOR) 40 MG tablet Take 1 tablet (40 mg total) by mouth daily.  90 tablet  3  . benzonatate (TESSALON) 100 MG capsule Take 100 mg by mouth every 8 (eight) hours as needed. For cough      . furosemide (LASIX) 40 MG tablet Take 1 tablet (40 mg total) by mouth 2 (two) times daily.  60 tablet  5  . Lancets MISC Use as directed to test blood sugar twice daily. Dx: 250.00  100 each  0  . metFORMIN (GLUCOPHAGE-XR) 500 MG 24 hr tablet TAKE 1 TABLET BY MOUTH TWICE A DAY  60 tablet  5  . NEXIUM 40 MG capsule TAKE 1 CAPSULE BY MOUTH ONCE A DAY  30 capsule  5  . potassium chloride SA (K-DUR,KLOR-CON) 20 MEQ tablet TAKE 1 TABLET (20 MEQ TOTAL) BY MOUTH 2 (TWO) TIMES DAILY.  60 tablet  5  .  predniSONE (DELTASONE) 10 MG tablet Take 10 mg by mouth daily.      . traMADol (ULTRAM) 50 MG tablet Take 1 tablet (50 mg total) by mouth every 6 (six) hours as needed for pain.  15 tablet  0  . [DISCONTINUED] atorvastatin (LIPITOR) 40 MG tablet Take 1 tablet (40 mg total) by mouth daily.  90 tablet  3  . [DISCONTINUED] pravastatin (PRAVACHOL) 80 MG tablet Take 1 tablet by mouth daily.       No facility-administered encounter medications on file as of 06/18/2013.

## 2013-06-19 NOTE — Assessment & Plan Note (Signed)
Stable copd Gold D Note pt taking pravachol and lipitor together Plan Stop pravachol Stay on lipitor Cont neb med

## 2013-07-14 ENCOUNTER — Encounter: Payer: Self-pay | Admitting: Internal Medicine

## 2013-07-14 ENCOUNTER — Telehealth: Payer: Self-pay

## 2013-07-14 ENCOUNTER — Ambulatory Visit (INDEPENDENT_AMBULATORY_CARE_PROVIDER_SITE_OTHER): Payer: Medicare Other | Admitting: Internal Medicine

## 2013-07-14 ENCOUNTER — Other Ambulatory Visit (INDEPENDENT_AMBULATORY_CARE_PROVIDER_SITE_OTHER): Payer: Medicare Other

## 2013-07-14 VITALS — BP 120/68 | HR 80 | Temp 98.4°F | Wt 170.4 lb

## 2013-07-14 DIAGNOSIS — E785 Hyperlipidemia, unspecified: Secondary | ICD-10-CM

## 2013-07-14 DIAGNOSIS — E1149 Type 2 diabetes mellitus with other diabetic neurological complication: Secondary | ICD-10-CM

## 2013-07-14 DIAGNOSIS — E119 Type 2 diabetes mellitus without complications: Secondary | ICD-10-CM

## 2013-07-14 DIAGNOSIS — F329 Major depressive disorder, single episode, unspecified: Secondary | ICD-10-CM

## 2013-07-14 LAB — HEMOGLOBIN A1C: Hgb A1c MFr Bld: 6 % (ref 4.6–6.5)

## 2013-07-14 MED ORDER — ATORVASTATIN CALCIUM 40 MG PO TABS: 40.0000 mg | ORAL_TABLET | Freq: Every day | ORAL | Status: DC

## 2013-07-14 NOTE — Assessment & Plan Note (Signed)
Was improved with addition of SSRI 2010/10/04 - but worse following death of mom 02/02/2011 -  exacerbated by hospitalization and acute illness summer 2013, then "breakup" with boyfriend 01/2013 Uses BZ prn to control anxiety overlap -declines to resume SSRI Continue present plan and medications. Support offered

## 2013-07-14 NOTE — Progress Notes (Signed)
Subjective:    Patient ID: Kaitlin Henderson, female    DOB: [birth year], 72 y.o.   MRN: [id number]  HPI Here for follow up - reviewed chronic medical issues:  DM2 - dx 03/2010 by routine pulm labs; started on metformin + glimepiride 03/2010; reports compliance with ongoing medical treatment and no changes in medication dose or frequency. denies adverse side effects related to current therapy. no hypoglycemic events or symptoms - checks sugars 2x/day - home cbg log reviewed - range 101-170s, lowest in AM   COPD - advanced dz, O2-dep; hospitalization summer 2013 with PNA - follows with pulm regularly; previously enrolled with hospice for same since 06/2009 through summer 2012; chronic pred + cont O2 use, inhaler changes fall 2012 with slightly improved symptoms - daily dyspnea on exertion but no cough or flares at this time - reports compliance with ongoing medical treatment and no changes in medication dose or frequency. denies adverse side effects related to current therapy.   breast cancer history. dx 1998 s/p chemo and xrt - s/p r mastect for same -   Anxiety/depression- largely related to trouble breathing and fear of being alone - "i have no one" since mom & spouse expired and children are "distantly involved" - uses BZ as needed - started SSRI 09/2010- then mom passed 01/2011, so she stopped medication - denies SI/HI  - not interesting in resuming SSRI at this time   Past Medical History  Diagnosis Date  . Breast cancer, right breast 1998    s/p chemo & XRT, right mastectomy  . OBESITY   . CORONARY ARTERY DISEASE   . APHTHOUS STOMATITIS   . DEPRESSION     started sertraline 09/2010  . DIABETES, TYPE 2 dx 03/2010  . HYPERLIPIDEMIA   . Hypothyroidism   . C O P D     chronic O2 3LPM [name]  . Essential tremor   . NSTEMI (non-ST elevated myocardial infarction) 01/2012    med mgmt     Review of Systems  Constitutional: Positive for fatigue. Negative for fever.  Respiratory: Positive for  shortness of breath (chronic DOE unchanged). Negative for cough and wheezing.   Cardiovascular: Negative for chest pain.  Genitourinary: Negative for dysuria and frequency.  Neurological: Negative for headaches.       Objective:   Physical ExamBP 120/68  Pulse 80  Temp(Src) 98.4 F (36.9 C) (Oral)  Wt 170 lb 6.4 oz (77.293 kg)  SpO2 90% Wt Readings from Last 3 Encounters:  07/14/13 170 lb 6.4 oz (77.293 kg)  06/18/13 176 lb 9.6 oz (80.105 kg)  05/19/13 171 lb (77.565 kg)   Constitutional: She is obese, but appears well-developed and well-nourished. No distress. Neck: Normal range of motion. Neck supple. No JVD present. No thyromegaly present.  Cardiovascular: Normal rate, regular rhythm and normal heart sounds.  No murmur heard. No BLE edema. Pulmonary/Chest: Effort normal at rest: breath sounds diminished at bases. She has no wheezes.  Skin: thinning hair but no alopecia - chronically pale but without change -  skin is warm and dry. No rash noted. No erythema.  Neuro: R hand active tremor without change Psychiatric: She has a dysphoric mood with overlapping mild anxiety.  Her behavior is normal. Judgment and thought content normal.      Lab Results  Component Value Date   WBC 13.3* 01/27/2012   HGB 14.9 01/27/2012   HCT 43.8 01/27/2012   PLT 285 01/27/2012   CHOL 239* 03/09/2013   TRIG 270.0*  03/09/2013   HDL 64.60 03/09/2013   LDLDIRECT 159.8 03/09/2013   ALT 23 09/25/2010   AST 22 09/25/2010   NA 140 10/08/2012   K 4.0 10/08/2012   CL 100 10/08/2012   CREATININE 1.0 10/08/2012   BUN 12 10/08/2012   CO2 31 10/08/2012   TSH 0.71 06/08/2012   HGBA1C 6.3 03/09/2013   MICROALBUR 1.0 03/09/2013   Assessment & Plan:   See problem list. Medications and labs reviewed today.

## 2013-07-14 NOTE — Progress Notes (Signed)
Pre-visit discussion using our clinic review tool. No additional management support is needed unless otherwise documented below in the visit note.  

## 2013-07-14 NOTE — Telephone Encounter (Signed)
Message copied by Eulis Manly on Wed Jul 14, 2013  6:14 PM ------      Message from: [name] A      Created: Wed Jul 14, 2013  5:36 PM       Please call patient if not on MyChart:      I have reviewed all lab results which are normal or stable. No treatment changes recommended. Let me know if there are questions or problems. Thanks ------

## 2013-07-14 NOTE — Assessment & Plan Note (Signed)
Complicated by neuropathy The current medical regimen is effective;  continue present plan and medications.  Metformin daily with prn amaryl for cbg >150 - no change in chronic steroid dose Home cbg 95-150s - reasonable control for state of other health issues Also on ASA 81, statin -  Lab Results  Component Value Date   HGBA1C 6.3 03/09/2013

## 2013-07-14 NOTE — Assessment & Plan Note (Signed)
On statin, clarified rx today recommended dose increase 10/2012 to max prava recheck labs now annually

## 2013-07-14 NOTE — Patient Instructions (Signed)
It was good to see you today.  We have reviewed your prior records including labs and tests today  Test(s) ordered today. Your results will be released to MyChart (or called to you) after review, usually within 72hours after test completion. If any changes need to be made, you will be notified at that same time.  Medications reviewed, no changes at this time  Please schedule followup in 4-6 months for diabetes check, call sooner if problems.

## 2013-07-14 NOTE — Telephone Encounter (Signed)
Patient informed of results.  

## 2013-07-26 ENCOUNTER — Other Ambulatory Visit: Payer: Self-pay | Admitting: *Deleted

## 2013-07-26 MED ORDER — METFORMIN HCL ER 500 MG PO TB24: ORAL_TABLET | ORAL | Status: DC

## 2013-07-28 ENCOUNTER — Other Ambulatory Visit: Payer: Self-pay | Admitting: *Deleted

## 2013-07-28 MED ORDER — FUROSEMIDE 40 MG PO TABS: 40.0000 mg | ORAL_TABLET | Freq: Two times a day (BID) | ORAL | Status: DC

## 2013-07-28 MED ORDER — POTASSIUM CHLORIDE CRYS ER 20 MEQ PO TBCR: EXTENDED_RELEASE_TABLET | ORAL | Status: DC

## 2013-07-28 NOTE — Telephone Encounter (Signed)
[facility] for [place] con and furosemide. Hospice Pt.  Ok per PW to refill.  Rxs sent.

## 2013-08-27 ENCOUNTER — Telehealth: Payer: Self-pay | Admitting: *Deleted

## 2013-08-27 ENCOUNTER — Other Ambulatory Visit: Payer: Self-pay | Admitting: Critical Care Medicine

## 2013-08-27 MED ORDER — ALBUTEROL SULFATE HFA 108 (90 BASE) MCG/ACT IN AERS: 2.0000 | INHALATION_SPRAY | Freq: Four times a day (QID) | RESPIRATORY_TRACT | Status: DC | PRN

## 2013-08-27 NOTE — Telephone Encounter (Signed)
Pt cannot afford maintenance inhalers  Refill the albuterol and get pt in for OV with either myself or TP to regroup

## 2013-08-27 NOTE — Telephone Encounter (Signed)
[facility].  Informed her of below per PW.  She verbalized understanding of this.  Reports pt picked up 2 inhalers on 08/15/13.  Pt is now out. Insurance will not cover this for 10 more days.  Purchase price for 1 inhaler is $61 with no insurance.  Sharyn Blitz I would call pt to discuss.   Called, spoke with pt. We have no albuterol hfa samples right now. Reports she does have enough medication to last until it can go through on insurance.  Pt states she is out of albuterol nebs and doesn't want rx called in.  States she would rather have the hfa than the nebs.  We have scheduled pt to see TP on Jan 8 at 3:30 pm.  Pt states she "feels good" right now and voiced no further questions or concerns at this time.  She will call back if needed prior to her pending OV.

## 2013-08-27 NOTE — Telephone Encounter (Signed)
[facility]. [facility] #2 x 3 additional refills.  With 2 inhalers being given each time, pt should still have rxs left. [facility]  Was advised pt is getting 2 inhalers, each inhaler has 200 puffs.  Pt picked 2 inhalers up on 10/3, 10/29, 11/21, and 12/14.  Each time pt needs the medication 1 day too soon before insurance will cover it.  Lelon Mast reports pt advised her she uses up to 20 puffs on some days.  Pt is using 400 puffs every 2-3 wks.  She does have an albuterol neb on list - Lelon Mast states pt isn't using this.  Albuterol neb was last filled on 05/07/13 for 3 boxex.  Lelon Mast reports pt states the albuterol nebs make her more jittery and isn't comfortable with them.  Lelon Mast believes pt needs a maintenance inhaler to help her.  Dr. Delford Field, pls advise.  Pt was last seen on 06/18/13.

## 2013-09-09 ENCOUNTER — Ambulatory Visit: Admitting: Adult Health

## 2013-09-13 ENCOUNTER — Ambulatory Visit (INDEPENDENT_AMBULATORY_CARE_PROVIDER_SITE_OTHER): Payer: Medicare Other | Admitting: Adult Health

## 2013-09-13 ENCOUNTER — Encounter: Payer: Self-pay | Admitting: Adult Health

## 2013-09-13 ENCOUNTER — Ambulatory Visit (INDEPENDENT_AMBULATORY_CARE_PROVIDER_SITE_OTHER)
Admission: RE | Admit: 2013-09-13 | Discharge: 2013-09-13 | Disposition: A | Discharge status: Home / Self Care | Payer: Medicare Other | Source: Ambulatory Visit | Attending: Adult Health | Admitting: Adult Health

## 2013-09-13 VITALS — BP 124/74 | HR 72 | Temp 97.6°F | Ht 62.5 in | Wt 179.2 lb

## 2013-09-13 DIAGNOSIS — J438 Other emphysema: Secondary | ICD-10-CM

## 2013-09-13 DIAGNOSIS — J439 Emphysema, unspecified: Secondary | ICD-10-CM

## 2013-09-13 MED ORDER — TIOTROPIUM BROMIDE MONOHYDRATE 18 MCG IN CAPS: 18.0000 ug | ORAL_CAPSULE | Freq: Every day | RESPIRATORY_TRACT | Status: DC

## 2013-09-13 NOTE — Progress Notes (Signed)
   Subjective:    Patient ID: Kaitlin Henderson, female    DOB: August 21, 1941, 73 y.o.   MRN: [id number]  HPI 40 WF copd Gold  II/  D quit smoking 2008  9/17 Follow up  Pt returns for a 2 week follow up  COPD - reports breathing is improved since last ov.  no new complaints Was treated for a COPD flare last ov with  Prednisone taper She is feeling much better. No hemoptysis, chest pain, orthopnea, edema .   06/18/2013 Chief Complaint  Patient presents with  . Follow-up    Pt c/o some dry cough.  No SOB, tightness, postnasal drip.    Now a dry cough.  Dyspnea is at baseline. Does have some pndrip. No chest pain.  No fever   09/13/2013 Follow up COPD  Pt returns for a COPD follow up  Says her breathing is at baseline.  No increased cough or dyspnea.  Has daily chronic dyspnea.  Has tried multiple inhaler and nebs in past.  Only using proair . Uses on average 6 times daily  Advised this is too much.  She says she has tried spiriva in past and felt this helped.  She does not like nebulizers .    Review of Systems Constitutional:   No  weight loss, night sweats,  Fevers, chills,  +fatigue, or  lassitude.  HEENT:   No headaches,  Difficulty swallowing,  Tooth/dental problems, or  Sore throat,                No sneezing, itching, ear ache, nasal congestion, post nasal drip,   CV:  No chest pain,  Orthopnea, PND, swelling in lower extremities, anasarca, dizziness, palpitations, syncope.   GI  No heartburn, indigestion, abdominal pain, nausea, vomiting, diarrhea, change in bowel habits, loss of appetite, bloody stools.   Resp:    No chest wall deformity  Skin: no rash or lesions.  GU: no dysuria, change in color of urine, no urgency or frequency.  No flank pain, no hematuria   MS:  No joint pain or swelling.  No decreased range of motion.  No back pain.  Psych:  No change in mood or affect. No depression or anxiety.  No memory loss.         Objective:   Physical Exam GEN:  A/Ox3; pleasant , NAD , elderly   HEENT:  [place]/AT,  EACs-clear, TMs-wnl, NOSE-clear, THROAT-clear, no lesions, no postnasal drip or exudate noted.   NECK:  Supple w/ fair ROM; no JVD; normal carotid impulses w/o bruits; no thyromegaly or nodules palpated; no lymphadenopathy.  RESP  Diminished BS in bases , no accessory muscle use, no dullness to percussion  CARD:  RRR, no m/r/g  , no peripheral edema, pulses intact, no cyanosis or clubbing.  GI:   Soft & nt; nml bowel sounds; no organomegaly or masses detected.  Musco: Warm bil, no deformities or joint swelling noted.   Neuro: alert, no focal deficits noted.    Skin: Warm, no lesions or rashes         Assessment & Plan:

## 2013-09-13 NOTE — Assessment & Plan Note (Addendum)
COPD -poor control with SABA over use  Multiple inhaler trials in past   Pt education on controller use vs rescue use Chest xray today   Plan  Begin Spiriva 1 puff daily .  Only use ProAir as rescue -this is your emergency inhaler.  Follow up Dr. Joya Gaskins  In 3 months and As needed

## 2013-09-13 NOTE — Patient Instructions (Signed)
Begin Spiriva 1 puff daily .  Only use ProAir as rescue -this is your emergency inhaler.  Follow up Dr. Joya Gaskins  In 3 months and As needed

## 2013-09-14 ENCOUNTER — Telehealth: Payer: Self-pay | Admitting: Adult Health

## 2013-09-14 NOTE — Telephone Encounter (Signed)
Pt notified of cxr results per [facility]

## 2013-09-23 ENCOUNTER — Telehealth: Payer: Self-pay | Admitting: Critical Care Medicine

## 2013-09-23 MED ORDER — ALBUTEROL SULFATE HFA 108 (90 BASE) MCG/ACT IN AERS: 2.0000 | INHALATION_SPRAY | Freq: Four times a day (QID) | RESPIRATORY_TRACT | Status: DC | PRN

## 2013-09-23 NOTE — Telephone Encounter (Signed)
Spoke with pt. She needed a refill on her proair. I advised will do so. RX has been sent. Nothing further needed

## 2013-10-07 ENCOUNTER — Other Ambulatory Visit: Payer: Self-pay | Admitting: Internal Medicine

## 2013-10-07 ENCOUNTER — Telehealth: Payer: Self-pay | Admitting: *Deleted

## 2013-10-07 MED ORDER — GLUCOSE BLOOD VI STRP: ORAL_STRIP | Status: DC

## 2013-10-07 NOTE — Telephone Encounter (Signed)
Faxed script [facility] to [facility].../lmb 

## 2013-10-07 NOTE — Telephone Encounter (Signed)
Last OV with PCP 07/14/13  Pt phoned requesting refills for diabetic test strips.  Refilled per protocol and message left for pt notifying her.

## 2013-11-09 ENCOUNTER — Other Ambulatory Visit: Payer: Self-pay | Admitting: Critical Care Medicine

## 2013-12-20 ENCOUNTER — Telehealth: Payer: Self-pay | Admitting: Critical Care Medicine

## 2013-12-20 NOTE — Telephone Encounter (Signed)
i am unaware of any letter.   She can either be offered tammy parrett ov , another provider, or add her to April 28 schedule.  She can take prednisone pulse in the meantime: pred 10mg  Take 4 for two days three for two days two for two days one for two days #20.

## 2013-12-20 NOTE — Telephone Encounter (Signed)
Called and spoke with pt and she was very upset on the phone---she stated that she didn't understand why she didn't get a letter from [place].  She stated that she has to be at the nursing home by Friday---she stated that she is having to go to the nursing home and has to be there by Friday.  Pt stated that her SOB is worse and she is now unable to walk.  She did state that when walking from the house to the car she does ok but from the car to the house she gets very short of breath and takes her 35 mins to get back into the house.  Pt wanted to make PW aware and see if any further recs from [place].  Thanks'  Allergies  Allergen Reactions  . Codeine     REACTION: makes pt pass out  . Levaquin [Levofloxacin In D5w] Other (See Comments)    redness  . Sulfonamide Derivatives     REACTION: edema    Current Outpatient Prescriptions on File Prior to Visit  Medication Sig Dispense Refill  . albuterol (PROVENTIL) (5 MG/ML) 0.5% nebulizer solution Take 0.5 mLs (2.5 mg total) by nebulization 3 (three) times daily.  120 mL  6  . ALPRAZolam (XANAX) 0.5 MG tablet Take 0.5-1 tablets (0.25-0.5 mg total) by mouth 2 (two) times daily as needed.  60 tablet  3  . aspirin 81 MG tablet Take 81 mg by mouth daily.        [name] atorvastatin (LIPITOR) 40 MG tablet Take 1 tablet (40 mg total) by mouth daily.  90 tablet  3  . benzonatate (TESSALON) 100 MG capsule Take 100 mg by mouth every 8 (eight) hours as needed. For cough      . furosemide (LASIX) 40 MG tablet Take 1 tablet (40 mg total) by mouth 2 (two) times daily.  180 tablet  1  . glucose blood (ONE TOUCH ULTRA TEST) test strip CHECK BLOOD SUGAR TWO TIMES A DAY DX: 250.00  100 each  3  . Lancets MISC Use as directed to test blood sugar twice daily. Dx: 250.00  100 each  0  . metFORMIN (GLUCOPHAGE-XR) 500 MG 24 hr tablet TAKE 1 TABLET BY MOUTH TWICE A DAY  60 tablet  5  . NEXIUM 40 MG capsule TAKE 1 CAPSULE BY MOUTH ONCE A DAY  30 capsule  5  . ONE TOUCH ULTRA TEST test  strip CHECK BLOOD SUGAR TWO TIMES A DAY DX: 250.00  50 each  1  . potassium chloride SA (K-DUR,KLOR-CON) 20 MEQ tablet TAKE 1 TABLET (20 MEQ TOTAL) BY MOUTH 2 (TWO) TIMES DAILY.  180 tablet  1  . predniSONE (DELTASONE) 10 MG tablet Take 10 mg by mouth daily.      [name] PROAIR HFA 108 (90 BASE) MCG/ACT inhaler INHALE 2 PUFFS INTO THE LUNGS EVERY 6 (SIX) HOURS AS NEEDED FOR WHEEZING.  8.5 each  2  . tiotropium (SPIRIVA HANDIHALER) 18 MCG inhalation capsule Place 1 capsule (18 mcg total) into inhaler and inhale daily.  30 capsule  5  . traMADol (ULTRAM) 50 MG tablet Take 1 tablet (50 mg total) by mouth every 6 (six) hours as needed for pain.  15 tablet  0   No current facility-administered medications on file prior to visit.

## 2013-12-20 NOTE — Telephone Encounter (Signed)
Pt is aware of PW's recs. States that she has plenty of prednisone. ROV has been scheduled for 12/28/13 at 3:45pm.

## 2013-12-21 ENCOUNTER — Telehealth: Payer: Self-pay | Admitting: Critical Care Medicine

## 2013-12-21 NOTE — Telephone Encounter (Signed)
Kaitlin Stain, MD at 12/20/2013 4:37 PM     Status: Signed        i am unaware of any letter. She can either be offered tammy parrett ov , another provider, or add her to April 28 schedule. She can take prednisone pulse in the meantime: pred 10mg  Take 4 for two days three for two days two for two days one for two days #20  --  Called made pt aware of prednisone directions. She wrote this down and needed nothing further

## 2013-12-28 ENCOUNTER — Ambulatory Visit (INDEPENDENT_AMBULATORY_CARE_PROVIDER_SITE_OTHER): Payer: Medicare Other | Admitting: Critical Care Medicine

## 2013-12-28 ENCOUNTER — Encounter: Payer: Self-pay | Admitting: Critical Care Medicine

## 2013-12-28 VITALS — BP 152/100 | HR 88 | Ht 62.5 in | Wt 188.6 lb

## 2013-12-28 DIAGNOSIS — J438 Other emphysema: Secondary | ICD-10-CM

## 2013-12-28 DIAGNOSIS — J439 Emphysema, unspecified: Secondary | ICD-10-CM

## 2013-12-28 MED ORDER — TIOTROPIUM BROMIDE MONOHYDRATE 18 MCG IN CAPS: 18.0000 ug | ORAL_CAPSULE | Freq: Every day | RESPIRATORY_TRACT | Status: DC

## 2013-12-28 MED ORDER — PREDNISONE 10 MG PO TABS: ORAL_TABLET | ORAL | Status: DC

## 2013-12-28 MED ORDER — ALBUTEROL SULFATE (5 MG/ML) 0.5% IN NEBU: 2.5000 mg | INHALATION_SOLUTION | Freq: Three times a day (TID) | RESPIRATORY_TRACT | Status: DC

## 2013-12-28 MED ORDER — ALBUTEROL SULFATE HFA 108 (90 BASE) MCG/ACT IN AERS: INHALATION_SPRAY | RESPIRATORY_TRACT | Status: DC

## 2013-12-28 NOTE — Patient Instructions (Signed)
REsume prednisone 10mg  Take 4 for three days 3 for three days 2 for three days then one daily Resume spiriva and albuterol Return 2 months

## 2013-12-28 NOTE — Progress Notes (Signed)
Subjective:    Patient ID: Kaitlin Henderson, female    DOB: 1940-09-26, 73 y.o.   MRN: [id number]  HPI  73 y.o. copd Gold   D quit smoking 2008  12/28/2013 Chief Complaint  Patient presents with  . 3 month follow up    Pt states since visit with TP breathing is unchanged. C/o SOB with activity. Pt states she has productive cough at night with clear and grey mucous. Denies CP.   Pt not able to manage any longer.  Pt had mold in the house. Mucus is clear to gray.   Pt denies any significant sore throat, nasal congestion or excess secretions, fever, chills, sweats, unintended weight loss, pleurtic or exertional chest pain, orthopnea PND, or leg swelling Pt denies any increase in rescue therapy over baseline, denies waking up needing it or having any early am or nocturnal exacerbations of coughing/wheezing/or dyspnea. Pt also denies any obvious fluctuation in symptoms with  weather or environmental change or other alleviating or aggravating factors    Review of Systems  Constitutional:   No  weight loss, night sweats,  Fevers, chills,  +fatigue, or  lassitude.  HEENT:   No headaches,  Difficulty swallowing,  Tooth/dental problems, or  Sore throat,                No sneezing, itching, ear ache, nasal congestion, post nasal drip,   CV:  No chest pain,  Orthopnea, PND, swelling in lower extremities, anasarca, dizziness, palpitations, syncope.   GI  No heartburn, indigestion, abdominal pain, nausea, vomiting, diarrhea, change in bowel habits, loss of appetite, bloody stools.   Resp:    No chest wall deformity  Skin: no rash or lesions.  GU: no dysuria, change in color of urine, no urgency or frequency.  No flank pain, no hematuria   MS:  No joint pain or swelling.  No decreased range of motion.  No back pain.  Psych:  No change in mood or affect. No depression or anxiety.  No memory loss.     Objective:   Physical Exam BP 152/100  Pulse 88  Ht 5' 2.5" (1.588 m)  Wt 188 lb  9.6 oz (85.548 kg)  BMI 33.92 kg/m2  SpO2 96%  GEN: A/Ox3; pleasant , NAD , elderly   HEENT:  [place]/AT,  EACs-clear, TMs-wnl, NOSE-clear, THROAT-clear, no lesions, no postnasal drip or exudate noted.   NECK:  Supple w/ fair ROM; no JVD; normal carotid impulses w/o bruits; no thyromegaly or nodules palpated; no lymphadenopathy.  RESP  Diminished BS in bases , no accessory muscle use, no dullness to percussion  CARD:  RRR, no m/r/g  , no peripheral edema, pulses intact, no cyanosis or clubbing.  GI:   Soft & nt; nml bowel sounds; no organomegaly or masses detected.  Musco: Warm bil, no deformities or joint swelling noted.   Neuro: alert, no focal deficits noted.    Skin: Warm, no lesions or rashes      Assessment & Plan:   COPD with emphysema, Gold D  Chronic obstructive lung disease gold stage D. Stable at this time Chronic respiratory failure Plan Maintain inhaled medications as prescribed   Updated Medication List Outpatient Encounter Prescriptions as of 12/28/2013  Medication Sig  . ALPRAZolam (XANAX) 0.5 MG tablet Take 0.5-1 tablets (0.25-0.5 mg total) by mouth 2 (two) times daily as needed.  [name] aspirin 81 MG tablet Take 81 mg by mouth daily.    [name] atorvastatin (LIPITOR) 40 MG  tablet Take 1 tablet (40 mg total) by mouth daily.  . benzonatate (TESSALON) 100 MG capsule Take 100 mg by mouth every 8 (eight) hours as needed. For cough  . furosemide (LASIX) 40 MG tablet Take 1 tablet (40 mg total) by mouth 2 (two) times daily.  [name] glucose blood (ONE TOUCH ULTRA TEST) test strip CHECK BLOOD SUGAR TWO TIMES A DAY DX: 250.00  . Lancets MISC Use as directed to test blood sugar twice daily. Dx: 250.00  . metFORMIN (GLUCOPHAGE-XR) 500 MG 24 hr tablet TAKE 1 TABLET BY MOUTH TWICE A DAY  . NEXIUM 40 MG capsule TAKE 1 CAPSULE BY MOUTH ONCE A DAY  . ONE TOUCH ULTRA TEST test strip CHECK BLOOD SUGAR TWO TIMES A DAY DX: 250.00  . potassium chloride SA (K-DUR,KLOR-CON) 20 MEQ tablet TAKE 1  TABLET (20 MEQ TOTAL) BY MOUTH 2 (TWO) TIMES DAILY.  [name] predniSONE (DELTASONE) 10 MG tablet Take 4 for three days 3 for three days 2 for three days 1 for three days and stop  . [DISCONTINUED] predniSONE (DELTASONE) 10 MG tablet Take 10 mg by mouth daily.  [name] albuterol (PROAIR HFA) 108 (90 BASE) MCG/ACT inhaler INHALE 2 PUFFS INTO THE LUNGS EVERY 6 (SIX) HOURS AS NEEDED FOR WHEEZING.  [name] albuterol (PROVENTIL) (5 MG/ML) 0.5% nebulizer solution Take 0.5 mLs (2.5 mg total) by nebulization 3 (three) times daily.  [name] tiotropium (SPIRIVA HANDIHALER) 18 MCG inhalation capsule Place 1 capsule (18 mcg total) into inhaler and inhale daily.  . traMADol (ULTRAM) 50 MG tablet Take 1 tablet (50 mg total) by mouth every 6 (six) hours as needed for pain.  . [DISCONTINUED] albuterol (PROVENTIL) (5 MG/ML) 0.5% nebulizer solution Take 0.5 mLs (2.5 mg total) by nebulization 3 (three) times daily.  . [DISCONTINUED] PROAIR HFA 108 (90 BASE) MCG/ACT inhaler INHALE 2 PUFFS INTO THE LUNGS EVERY 6 (SIX) HOURS AS NEEDED FOR WHEEZING.  . [DISCONTINUED] tiotropium (SPIRIVA HANDIHALER) 18 MCG inhalation capsule Place 1 capsule (18 mcg total) into inhaler and inhale daily.

## 2013-12-29 NOTE — Assessment & Plan Note (Signed)
Chronic obstructive lung disease gold stage D. Stable at this time Chronic respiratory failure Plan Maintain inhaled medications as prescribed

## 2014-01-05 ENCOUNTER — Ambulatory Visit: Admitting: Internal Medicine

## 2014-01-12 ENCOUNTER — Ambulatory Visit: Admitting: Internal Medicine

## 2014-02-23 ENCOUNTER — Ambulatory Visit: Payer: Medicare Other | Admitting: Critical Care Medicine

## 2014-02-23 ENCOUNTER — Telehealth: Payer: Self-pay | Admitting: Critical Care Medicine

## 2014-02-23 NOTE — Telephone Encounter (Signed)
LMTCB

## 2014-02-24 NOTE — Telephone Encounter (Signed)
LMTCB x2  

## 2014-02-25 MED ORDER — ALBUTEROL SULFATE HFA 108 (90 BASE) MCG/ACT IN AERS: INHALATION_SPRAY | RESPIRATORY_TRACT | Status: DC

## 2014-02-25 NOTE — Telephone Encounter (Signed)
Pt needed RX sent for proair. Nothing further needed

## 2014-03-08 ENCOUNTER — Telehealth: Payer: Self-pay

## 2014-03-08 NOTE — Telephone Encounter (Signed)
Diabetic bundle-pt declined appointments because she states she dosent have transportation to [facility]

## 2014-03-30 ENCOUNTER — Telehealth: Payer: Self-pay | Admitting: Critical Care Medicine

## 2014-03-30 MED ORDER — ALBUTEROL SULFATE HFA 108 (90 BASE) MCG/ACT IN AERS: INHALATION_SPRAY | RESPIRATORY_TRACT | Status: DC

## 2014-03-30 NOTE — Addendum Note (Signed)
Addended by: Virl Cagey on: 03/30/2014 01:59 PM   Modules accepted: Orders

## 2014-03-30 NOTE — Telephone Encounter (Signed)
Proair has been sent to [facility] in [place]  Pt aware. Nothing further needed.

## 2014-04-04 ENCOUNTER — Telehealth: Payer: Self-pay | Admitting: Critical Care Medicine

## 2014-04-04 ENCOUNTER — Ambulatory Visit (INDEPENDENT_AMBULATORY_CARE_PROVIDER_SITE_OTHER): Payer: Medicare Other | Admitting: Critical Care Medicine

## 2014-04-04 ENCOUNTER — Encounter: Payer: Self-pay | Admitting: Critical Care Medicine

## 2014-04-04 VITALS — BP 148/76 | HR 87 | Ht 62.5 in | Wt 202.0 lb

## 2014-04-04 DIAGNOSIS — J438 Other emphysema: Secondary | ICD-10-CM

## 2014-04-04 DIAGNOSIS — J439 Emphysema, unspecified: Secondary | ICD-10-CM

## 2014-04-04 MED ORDER — ALPRAZOLAM 0.5 MG PO TABS: 0.2500 mg | ORAL_TABLET | Freq: Two times a day (BID) | ORAL | Status: DC | PRN

## 2014-04-04 MED ORDER — ATORVASTATIN CALCIUM 40 MG PO TABS: 40.0000 mg | ORAL_TABLET | Freq: Every day | ORAL | Status: DC

## 2014-04-04 MED ORDER — POTASSIUM CHLORIDE CRYS ER 20 MEQ PO TBCR: EXTENDED_RELEASE_TABLET | ORAL | Status: DC

## 2014-04-04 MED ORDER — TRAMADOL HCL 50 MG PO TABS: 50.0000 mg | ORAL_TABLET | Freq: Four times a day (QID) | ORAL | Status: DC | PRN

## 2014-04-04 MED ORDER — ALBUTEROL SULFATE HFA 108 (90 BASE) MCG/ACT IN AERS: INHALATION_SPRAY | RESPIRATORY_TRACT | Status: DC

## 2014-04-04 MED ORDER — ALBUTEROL SULFATE (5 MG/ML) 0.5% IN NEBU: 2.5000 mg | INHALATION_SOLUTION | Freq: Three times a day (TID) | RESPIRATORY_TRACT | Status: DC

## 2014-04-04 MED ORDER — FUROSEMIDE 40 MG PO TABS: 40.0000 mg | ORAL_TABLET | Freq: Two times a day (BID) | ORAL | Status: DC

## 2014-04-04 MED ORDER — ASPIRIN 81 MG PO TABS: 81.0000 mg | ORAL_TABLET | Freq: Every day | ORAL | Status: DC

## 2014-04-04 MED ORDER — TIOTROPIUM BROMIDE MONOHYDRATE 18 MCG IN CAPS: 18.0000 ug | ORAL_CAPSULE | Freq: Every day | RESPIRATORY_TRACT | Status: DC

## 2014-04-04 MED ORDER — ESOMEPRAZOLE MAGNESIUM 40 MG PO CPDR: DELAYED_RELEASE_CAPSULE | ORAL | Status: DC

## 2014-04-04 MED ORDER — ALBUTEROL SULFATE (2.5 MG/3ML) 0.083% IN NEBU: 2.5000 mg | INHALATION_SOLUTION | Freq: Three times a day (TID) | RESPIRATORY_TRACT | Status: DC

## 2014-04-04 NOTE — Telephone Encounter (Signed)
RX has been sent in. Nothing further needed 

## 2014-04-04 NOTE — Telephone Encounter (Signed)
Called spoke with [facility],. They received RX for pt albuterol neb solution (5 MG/ML). They are wanting to change this to dose 2.5mg /81ml. Per [facility] this is the normal albuterol neb RX sent in. Please advise thanks

## 2014-04-04 NOTE — Telephone Encounter (Signed)
This is ok with me  

## 2014-04-04 NOTE — Patient Instructions (Signed)
Refills on all meds given, will ask [facility] to deliver  No change in medications Return 2 months

## 2014-04-04 NOTE — Assessment & Plan Note (Signed)
Gold stage D. COPD Note this patient has been in assisted living but is not receiving her pulmonary medicines on a regular basis Plan all medications were refilled and will be delivered to the patient's assisted living Center A nebulized treatment was given

## 2014-04-04 NOTE — Progress Notes (Signed)
Subjective:    Patient ID: Kaitlin Henderson, female    DOB: [birth year], 73 y.o.   MRN: [id number]  HPI 04/04/2014 Chief Complaint  Patient presents with  . Follow-up    Pt states she breathes well as long as she is on her 77.  States she does have portable 02 but she does not know how to set it up.     Pt was doing poorly at home. Now at [facility] [place].   Does ok on the oxygen .  Notes some edema.  Needs refills on lasix  Pt has been out of medications for several months Pt denies any significant sore throat, nasal congestion or excess secretions, fever, chills, sweats, unintended weight loss, pleurtic or exertional chest pain, orthopnea PND, or leg swelling Pt denies any increase in rescue therapy over baseline, denies waking up needing it or having any early am or nocturnal exacerbations of coughing/wheezing/or dyspnea. Pt also denies any obvious fluctuation in symptoms with  weather or environmental change or other alleviating or aggravating factors   Review of Systems 11 point review of systems taken in detail and is negative except as noted above    Objective:   Physical Exam Filed Vitals:   04/04/14 1118  BP: 148/76  Pulse: 87  Height: 5' 2.5" (1.588 m)  Weight: 202 lb (91.627 kg)  SpO2: 93%    Gen: Anxious female on nasal oxygen sitting in wheelchair in no distress  ENT: No lesions,  mouth clear,  oropharynx clear, no postnasal drip  Neck: No JVD, no TMG, no carotid bruits  Lungs: No use of accessory muscles, no dullness to percussion, expired wheezes  Cardiovascular: RRR, heart sounds normal, no murmur or gallops, no peripheral edema  Abdomen: soft and NT, no HSM,  BS normal  Musculoskeletal: No deformities, no cyanosis or clubbing  Neuro: alert, non focal  Skin: Warm, no lesions or rashes  No results found.        Assessment & Plan:   COPD with emphysema, Gold D  Gold stage D. COPD Note this patient has been in assisted living but is not  receiving her pulmonary medicines on a regular basis Plan all medications were refilled and will be delivered to the patient's assisted living Center A nebulized treatment was given   Updated Medication List Outpatient Encounter Prescriptions as of 04/04/2014  Medication Sig  . benzonatate (TESSALON) 100 MG capsule Take 100 mg by mouth every 8 (eight) hours as needed. For cough  . esomeprazole (NEXIUM) 40 MG capsule TAKE 1 CAPSULE BY MOUTH ONCE A DAY  . glucose blood (ONE TOUCH ULTRA TEST) test strip CHECK BLOOD SUGAR TWO TIMES A DAY DX: 250.00  . Lancets MISC Use as directed to test blood sugar twice daily. Dx: 250.00  . metFORMIN (GLUCOPHAGE-XR) 500 MG 24 hr tablet TAKE 1 TABLET BY MOUTH TWICE A DAY  . ONE TOUCH ULTRA TEST test strip CHECK BLOOD SUGAR TWO TIMES A DAY DX: 250.00  . [DISCONTINUED] NEXIUM 40 MG capsule TAKE 1 CAPSULE BY MOUTH ONCE A DAY  . albuterol (PROAIR HFA) 108 (90 BASE) MCG/ACT inhaler INHALE 2 PUFFS INTO THE LUNGS EVERY 6 (SIX) HOURS AS NEEDED FOR WHEEZING.  [name] albuterol (PROVENTIL) (5 MG/ML) 0.5% nebulizer solution Take 0.5 mLs (2.5 mg total) by nebulization 3 (three) times daily.  [name] ALPRAZolam (XANAX) 0.5 MG tablet Take 0.5-1 tablets (0.25-0.5 mg total) by mouth 2 (two) times daily as needed.  [name] aspirin 81 MG tablet Take  1 tablet (81 mg total) by mouth daily.  [name] atorvastatin (LIPITOR) 40 MG tablet Take 1 tablet (40 mg total) by mouth daily.  . furosemide (LASIX) 40 MG tablet Take 1 tablet (40 mg total) by mouth 2 (two) times daily.  . potassium chloride SA (K-DUR,KLOR-CON) 20 MEQ tablet TAKE 1 TABLET (20 MEQ TOTAL) BY MOUTH 2 (TWO) TIMES DAILY.  [name] tiotropium (SPIRIVA HANDIHALER) 18 MCG inhalation capsule Place 1 capsule (18 mcg total) into inhaler and inhale daily.  . traMADol (ULTRAM) 50 MG tablet Take 1 tablet (50 mg total) by mouth every 6 (six) hours as needed.  . [DISCONTINUED] albuterol (PROAIR HFA) 108 (90 BASE) MCG/ACT inhaler INHALE 2 PUFFS INTO THE LUNGS EVERY  6 (SIX) HOURS AS NEEDED FOR WHEEZING.  . [DISCONTINUED] albuterol (PROVENTIL) (5 MG/ML) 0.5% nebulizer solution Take 0.5 mLs (2.5 mg total) by nebulization 3 (three) times daily.  . [DISCONTINUED] ALPRAZolam (XANAX) 0.5 MG tablet Take 0.5-1 tablets (0.25-0.5 mg total) by mouth 2 (two) times daily as needed.  . [DISCONTINUED] aspirin 81 MG tablet Take 81 mg by mouth daily.    . [DISCONTINUED] atorvastatin (LIPITOR) 40 MG tablet Take 1 tablet (40 mg total) by mouth daily.  . [DISCONTINUED] furosemide (LASIX) 40 MG tablet Take 1 tablet (40 mg total) by mouth 2 (two) times daily.  . [DISCONTINUED] potassium chloride SA (K-DUR,KLOR-CON) 20 MEQ tablet TAKE 1 TABLET (20 MEQ TOTAL) BY MOUTH 2 (TWO) TIMES DAILY.  . [DISCONTINUED] predniSONE (DELTASONE) 10 MG tablet Take 4 for three days 3 for three days 2 for three days 1 for three days and stop  . [DISCONTINUED] tiotropium (SPIRIVA HANDIHALER) 18 MCG inhalation capsule Place 1 capsule (18 mcg total) into inhaler and inhale daily.  . [DISCONTINUED] traMADol (ULTRAM) 50 MG tablet Take 1 tablet (50 mg total) by mouth every 6 (six) hours as needed for pain.

## 2014-06-06 ENCOUNTER — Telehealth: Payer: Self-pay | Admitting: Critical Care Medicine

## 2014-06-06 NOTE — Telephone Encounter (Signed)
Spoke with Cletis Athens  She works with a home health agency affiliated with [facility], where the pt resides  She states that the pt is requesting their services to help with PT and OT  She has faxed a form and wants to know if PW is okay with signing for this  Please advise thanks

## 2014-06-06 NOTE — Telephone Encounter (Signed)
noted 

## 2014-06-06 NOTE — Telephone Encounter (Signed)
Spoke with Kaitlin Henderson, informed her of below.  She verbalized understanding and will refax form to triage.  Will await fax.

## 2014-06-06 NOTE — Telephone Encounter (Signed)
i am ok and will sign for pt /ot

## 2014-06-06 NOTE — Telephone Encounter (Signed)
Fax received.  Will have PW address tomorrow.

## 2014-06-07 NOTE — Telephone Encounter (Signed)
Cletis Athens returned call. Needs these forms faxed back today. Please call her at - [phone] Fax [phone]

## 2014-06-07 NOTE — Telephone Encounter (Signed)
[facility] Rapid Referral Form Completed.  It has been faxed to below fax # and placed in scan folder.  Cletis Athens aware and voiced no further questions or concerns at this time.

## 2014-06-07 NOTE — Telephone Encounter (Signed)
Will forward to Crystal as she has the forms.

## 2014-06-08 ENCOUNTER — Telehealth: Payer: Self-pay | Admitting: Critical Care Medicine

## 2014-06-08 NOTE — Telephone Encounter (Signed)
Spoke with [place]. She reports d/t pt insurance they are not able to take her on as pt. She will need to be sent to another DME.  They received orders for PT/OT/nursing to eval and treat. Please advise PCC's thanks

## 2014-06-08 NOTE — Telephone Encounter (Signed)
Crystal it appears that you had filled out some forms for this patient for this company, have you seen this? [place] was not involved in this

## 2014-06-13 ENCOUNTER — Telehealth: Payer: Self-pay | Admitting: Critical Care Medicine

## 2014-06-13 NOTE — Telephone Encounter (Signed)
Form was completed and faxed to [facility] with [place] Rapid Referral for PT/OT/nursing to eval and treat.   Form was placed in scan folder but has not yet been scanned into chart.   Do you know of a DME that accepts pt's insurance and can provide these services for pt?

## 2014-06-13 NOTE — Telephone Encounter (Signed)
Called spoke with Santiago Glad w/ [facility]. She was told by pt that Dr. Joya Gaskins was her PCP. I advised her according to her chart, she see's Dr. Asa Lente. She will call and make pt aware she needs to call them. Nothing further needed

## 2014-06-13 NOTE — Telephone Encounter (Signed)
Spoke with Santiago Glad at [facility]- she states the patient is telling her she does not want any home health but only wants a motorized wheelchair. Santiago Glad is aware that we will need to run this by PW first before sending any orders to them. She is aware that PW is back in the [place] office on Wednesday 06-15-14 AM.

## 2014-06-13 NOTE — Telephone Encounter (Signed)
Why cant the pts pcp write for the motorized wheelchair

## 2014-06-21 ENCOUNTER — Telehealth: Payer: Self-pay | Admitting: Critical Care Medicine

## 2014-06-21 NOTE — Telephone Encounter (Signed)
lmtcb x1 

## 2014-06-22 NOTE — Telephone Encounter (Signed)
lmtcb

## 2014-06-22 NOTE — Telephone Encounter (Signed)
lmtcb for Kaitlin Henderson

## 2014-06-22 NOTE — Telephone Encounter (Signed)
Arville Go from [facility] returning call - [phone]

## 2014-06-23 ENCOUNTER — Telehealth: Payer: Self-pay | Admitting: Critical Care Medicine

## 2014-06-23 NOTE — Telephone Encounter (Signed)
This would fall under pts PCP

## 2014-06-23 NOTE — Telephone Encounter (Signed)
Pt cannot come in tomorrow. Has no transportation. Was told by her nursing home that she needed to call us to get a portable 02 tank to put on her motorized "cart" to drive down to the dining hall with her.  I advised that she would need to speak to her PCP about the motorized cart and she understands this.  She is also requesting an order for OT d/t not being able to stand, worsening SOB.  Dr. Joya Gaskins please advise.  Thank you.

## 2014-06-23 NOTE — Telephone Encounter (Signed)
Called and spoke to [place], with [employer]. Mechele Claude wanted to give PW FYI on pt's home situation. Mechele Claude stated that the pt is not taking her medications like she should. Pt refusing to take Lasix and is not filling spiriva rx. Advised I will fax the pt's med list to show what pt is suppose to be on. Mechele Claude stated that the pt was not confused, she answered Joanne's questions appropriately. However, the pt stated she has not showered in 1 month but then stated her daughter-in-law gave her a bath on 06/22/14. Pt also stated we are not to contact any of her family members regarding her. Mechele Claude has RN visits once a week for 9 weeks with PRN visits. Mechele Claude stated she would talk to the social worker and see if there is something that can be done to help with the medication issue.  Pt had OV with PW on 04/04/2014. Med list faxed to [facility] at [phone].  Will send to PW as FYI.

## 2014-06-23 NOTE — Telephone Encounter (Signed)
Patient is calling back.  She is now asking for motorized wheelchair.  [phone]

## 2014-06-23 NOTE — Telephone Encounter (Signed)
Called and spoke with pt and she was advised of PW recs.  She stated that Dr. Asa Lente is her PCP and she stated that she does not know anything about what is going on with her and the pt is requesting that PW help her with this.  Pt requested that i send this back to PW for recs.  thanks

## 2014-06-23 NOTE — Telephone Encounter (Signed)
Noted  This pt has had prior hx of depression/anxiety  I suspect this is playing a role.  PCP helped with this in past

## 2014-06-23 NOTE — Telephone Encounter (Signed)
i cannot do this over phone.  Suggest to the pt an OV with Tammy Parrett to sort out

## 2014-06-23 NOTE — Telephone Encounter (Signed)
I called spoke with Santiago Glad w/ [facility]. She is requesting OT for pt. Please advise if okay? thanks

## 2014-06-23 NOTE — Telephone Encounter (Signed)
Spoke with pt. She is requesting a motorized "cart" not a wheelchair. She is aware PW not in until Monday. She was told by hospice she needed to call Dr. Joya Gaskins. Please advise thanks

## 2014-06-24 NOTE — Telephone Encounter (Signed)
Spoke with Santiago Glad and notified ok for OT per PW  She verbalized understanding  Nothing further needed

## 2014-06-24 NOTE — Telephone Encounter (Signed)
i will be ok authorizing an OT order

## 2014-06-29 ENCOUNTER — Telehealth: Payer: Self-pay | Admitting: Critical Care Medicine

## 2014-06-29 DIAGNOSIS — J439 Emphysema, unspecified: Secondary | ICD-10-CM

## 2014-06-29 NOTE — Telephone Encounter (Signed)
lmtcb X1 for [employer].

## 2014-06-30 NOTE — Telephone Encounter (Signed)
i am ok with this order 

## 2014-06-30 NOTE — Telephone Encounter (Signed)
Called spoke with Elta Guadeloupe, PT with [facility].  Requesting order for Social Worker to help pt find resources to secure medication options as pt has difficulty affording her medications/inhalers.  States they have a Education officer, museum in their department that can visit with pt to discuss the resources available.  Gave VO for Education officer, museum and advised order will be placed.  Elta Guadeloupe verbalized understanding and voiced no further questions or concerns at this time.

## 2014-07-01 ENCOUNTER — Telehealth: Payer: Self-pay | Admitting: Critical Care Medicine

## 2014-07-01 NOTE — Telephone Encounter (Signed)
See phone note 06/29/14. PW ha already okay'd this.  Called Barnett Applebaum and made her aware of this. Nothing further needed

## 2014-07-05 ENCOUNTER — Telehealth: Payer: Self-pay | Admitting: Critical Care Medicine

## 2014-07-05 NOTE — Telephone Encounter (Signed)
Called pt and made her aware per 06/23/14 Phone note pt needs to get this through PCP. She will call them. Nothing further needed

## 2014-07-11 ENCOUNTER — Encounter: Payer: Self-pay | Admitting: Family

## 2014-07-11 ENCOUNTER — Ambulatory Visit (INDEPENDENT_AMBULATORY_CARE_PROVIDER_SITE_OTHER): Payer: Medicare Other | Admitting: Family

## 2014-07-11 VITALS — BP 152/98 | HR 109 | Temp 97.9°F | Resp 22 | Wt 208.8 lb

## 2014-07-11 DIAGNOSIS — J439 Emphysema, unspecified: Secondary | ICD-10-CM

## 2014-07-11 NOTE — Patient Instructions (Addendum)
Thank you for choosing [employer].  Summary/Instructions:   We will send a prescription for a motorized wheelchair to [facility]. You should be hearing back from them soon.  If you do not hear back in a 5-7 days please let us know.

## 2014-07-11 NOTE — Assessment & Plan Note (Addendum)
Currently being managed by Dr. Joya Gaskins of Pulmonology. Movement and completion of ADLs is limited by her dyspnea, wheezing and coughing.  She is unable to propel herself in the office wheelchair without markedly increased dyspnea. When off her oxygen her oxygen saturations drop. Recommend motorized wheelchair to assist with mobility and complete activities of daily living and need for continued oxygen.

## 2014-07-11 NOTE — Progress Notes (Signed)
Pre visit review using our clinic review tool, if applicable. No additional management support is needed unless otherwise documented below in the visit note. 

## 2014-07-11 NOTE — Progress Notes (Signed)
Subjective:    Patient ID: Kaitlin Henderson, female    DOB: 1941/06/23, 73 y.o.   MRN: [id number]  Chief Complaint  Patient presents with  . Follow-up    Wants a wheelchair   HPI:  Kaitlin Henderson is a 73 y.o. female who presents today to discuss obtaining a wheelchair.   Kaitlin Henderson has an extensive history of multiple medical conditions. She is currently a resident of [place]. She has been having increased difficulty with shortness of breath which has affected her daily life. Currently she requires continuous oxygen at 3L via nasal cannula. Expresses she has little ability to perform her activities of daily living and is confined to her room most of the time. She currently uses a wheelchair that belongs to [facility]. She is unable to leave her room without assistance to go to the dining hall for meals. She expresses that she has missed meals because there is no one available to push her to the dining room. She is currently unable to propel herself in the non-motorized wheelchair without her oxygen levels dropping and becoming short of breath. She is requesting a motorized wheelchair to assist her in her daily activities.   Allergies  Allergen Reactions  . Codeine     REACTION: makes pt pass out  . Levaquin [Levofloxacin In D5w] Other (See Comments)    redness  . Sulfonamide Derivatives     REACTION: edema   Current Outpatient Prescriptions on File Prior to Visit  Medication Sig Dispense Refill  . albuterol (PROAIR HFA) 108 (90 BASE) MCG/ACT inhaler INHALE 2 PUFFS INTO THE LUNGS EVERY 6 (SIX) HOURS AS NEEDED FOR WHEEZING. 8.5 each 2  . albuterol (PROVENTIL) (2.5 MG/3ML) 0.083% nebulizer solution Take 3 mLs (2.5 mg total) by nebulization 3 (three) times daily. DX 496 360 mL 6  . ALPRAZolam (XANAX) 0.5 MG tablet Take 0.5-1 tablets (0.25-0.5 mg total) by mouth 2 (two) times daily as needed. 60 tablet 3  . aspirin 81 MG tablet Take 1 tablet (81 mg  total) by mouth daily. 30 tablet 6  . atorvastatin (LIPITOR) 40 MG tablet Take 1 tablet (40 mg total) by mouth daily. 90 tablet 3  . benzonatate (TESSALON) 100 MG capsule Take 100 mg by mouth every 8 (eight) hours as needed. For cough    . esomeprazole (NEXIUM) 40 MG capsule TAKE 1 CAPSULE BY MOUTH ONCE A DAY 30 capsule 5  . furosemide (LASIX) 40 MG tablet Take 1 tablet (40 mg total) by mouth 2 (two) times daily. 180 tablet 1  . glucose blood (ONE TOUCH ULTRA TEST) test strip CHECK BLOOD SUGAR TWO TIMES A DAY DX: 250.00 100 each 3  . Lancets MISC Use as directed to test blood sugar twice daily. Dx: 250.00 100 each 0  . metFORMIN (GLUCOPHAGE-XR) 500 MG 24 hr tablet TAKE 1 TABLET BY MOUTH TWICE A DAY 60 tablet 5  . ONE TOUCH ULTRA TEST test strip CHECK BLOOD SUGAR TWO TIMES A DAY DX: 250.00 50 each 1  . potassium chloride SA (K-DUR,KLOR-CON) 20 MEQ tablet TAKE 1 TABLET (20 MEQ TOTAL) BY MOUTH 2 (TWO) TIMES DAILY. 180 tablet 4  . tiotropium (SPIRIVA HANDIHALER) 18 MCG inhalation capsule Place 1 capsule (18 mcg total) into inhaler and inhale daily. 30 capsule 5  . traMADol (ULTRAM) 50 MG tablet Take 1 tablet (50 mg total) by mouth every 6 (six) hours as needed. 30 tablet 4   No current facility-administered medications  on file prior to visit.    Review of Systems  See HPI    Objective:    BP 152/98 mmHg  Pulse 109  Temp(Src) 97.9 F (36.6 C) (Oral)  Resp 22  Wt 208 lb 12.8 oz (94.711 kg)  SpO2 94% Nursing note and vital signs reviewed.  Physical Exam  Constitutional: She is oriented to person, place, and time. She appears well-developed.  Seated in a wheelchair with a nasal cannula connected to 4 L of oxygen. Dressed appropriately, with caregiver present. Breathing is shallow at best with audible wheezes at rest. Can speak in full sentences and becomes short of breath after.   Cardiovascular: Normal rate, regular rhythm, normal heart sounds and intact distal pulses.   Pulmonary/Chest:  No respiratory distress. She has wheezes.  Shallow, but adequate breaths.   Musculoskeletal:  Able to stand and pivot with moderate amount of assistance, but shortness of breath is noted. Unable to propel the office wheelchair across the room without dyspnea, wheezing and coughing.   Neurological: She is alert and oriented to person, place, and time.  Skin: Skin is warm and dry.  Psychiatric: She has a normal mood and affect. Her behavior is normal. Judgment and thought content normal.       Assessment & Plan:

## 2014-07-19 ENCOUNTER — Telehealth: Payer: Self-pay | Admitting: Internal Medicine

## 2014-07-19 NOTE — Telephone Encounter (Signed)
Pt checking on motorized car status, as discussed per appt (per pt). Please advise. [phone]

## 2014-07-19 NOTE — Telephone Encounter (Signed)
Please inform patient that the required information was sent to [place] [facility] last week. We have not heard anything back from them, so I would suggest calling [facility] to check the status.

## 2014-07-20 NOTE — Telephone Encounter (Signed)
Pt called to state [place], did not receive fax for motorized cart. Pt requesting re-fax and please let her know when it is faxed.

## 2014-07-21 NOTE — Telephone Encounter (Signed)
Getting all information together to fax. Still waiting on home health to call back to figure out everything we need to send.

## 2014-07-25 ENCOUNTER — Telehealth: Payer: Self-pay | Admitting: Family

## 2014-07-25 NOTE — Telephone Encounter (Signed)
Please call patient and inform her that we will need to see her in the office for a full mobility exam before we can justify her wheelchair use per the insurance companies. The information they are requesting information that calls for information that needs to be collected. We are sorry for any inconvenience, however this is what is required.

## 2014-07-25 NOTE — Telephone Encounter (Signed)
Called pt and left message for her to call me back

## 2014-07-26 NOTE — Telephone Encounter (Signed)
Called pt again. She refused to come back in for the mobility exam and said stated that she had already told us one time that she wasn't going to do that exam. She told us to just forget about the wheelchair request.

## 2014-08-02 ENCOUNTER — Telehealth: Payer: Self-pay | Admitting: Internal Medicine

## 2014-08-02 NOTE — Telephone Encounter (Signed)
Patient would like a call back in regards to a wheelchair that was suppose to be ordered?

## 2014-08-03 ENCOUNTER — Telehealth: Payer: Self-pay

## 2014-08-03 NOTE — Telephone Encounter (Signed)
Pt is in need for motorized wheel chair.   Kaitlin Henderson has been trying to get one for the pt but was unsuccessful getting the pt to come in for evaluation.  Pt called [facility] and pt stated that they could get her one with an rx.   Lovena Le brought me the packet and relayed the above information to me.   Is there anything that we can do?

## 2014-08-03 NOTE — Telephone Encounter (Signed)
Must have an OV for specific purpose of documenting motorized WC need (ie, no other med topics discussed, only motorized WC) [facility] rep generally accompanies pt for this purpose -  Ask [facility] who at [facility] is current rep for motorized WC rx and coordinate OV with [facility] rep and pt to address same Thanks!

## 2014-08-03 NOTE — Telephone Encounter (Signed)
Called pt back. She informed me that [facility] told her that they were going to just send her a wheelchair they were waiting for our rx to be sent. I let her know that [facility] sent [place] a packet of what was required and it required her to come back in to get evaluated. She is very upset and stated she was going to call [facility] to take care of the situation.

## 2014-08-08 ENCOUNTER — Ambulatory Visit: Admitting: Family

## 2014-08-08 ENCOUNTER — Other Ambulatory Visit: Payer: Self-pay

## 2014-08-08 MED ORDER — ALBUTEROL SULFATE HFA 108 (90 BASE) MCG/ACT IN AERS: 2.0000 | INHALATION_SPRAY | Freq: Four times a day (QID) | RESPIRATORY_TRACT | Status: DC | PRN

## 2014-08-08 NOTE — Telephone Encounter (Signed)
Spoke to pt about the requirements for [facility] application as stated below.   Pt still scheduled for a day that was not appropriate for [facility] and pt to be here.   Called pt back and explained again. Rescheduled the pt for 12/14 and Debbie with [facility] will be here also for that appt.   Pt stated understanding and will call me if there is any issue with that day or time.

## 2014-08-15 ENCOUNTER — Ambulatory Visit (INDEPENDENT_AMBULATORY_CARE_PROVIDER_SITE_OTHER): Payer: Medicare Other | Admitting: Family

## 2014-08-15 VITALS — BP 142/60 | HR 111 | Temp 98.7°F | Resp 22 | Ht 62.0 in | Wt 210.0 lb

## 2014-08-15 DIAGNOSIS — R2689 Other abnormalities of gait and mobility: Secondary | ICD-10-CM

## 2014-08-15 NOTE — Progress Notes (Signed)
Pre visit review using our clinic review tool, if applicable. No additional management support is needed unless otherwise documented below in the visit note. 

## 2014-08-15 NOTE — Progress Notes (Signed)
Subjective:    Patient ID: Kaitlin Henderson, female    DOB: 1941-04-05, 73 y.o.   MRN: [id number]  Chief Complaint  Patient presents with  . Follow-up    Mobility exam for powered wheelchair    HPI:  Kaitlin Henderson is a 73 y.o. female who presents today for a mobility exam for a powered wheelchair.   Kaitlin Henderson has an extensive history of multiple medical conditions. She is currently a resident of [place]. She has been having increased difficulty with shortness of breath which has affected her daily life. Currently she requires continuous oxygen at 3L via nasal cannula.   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   She has difficulty performing activities of daily living secondary to oxygen desaturation with exertion. Activities effected include moving room to room, feeding (limited secondary to access to food), and toileting. She expresses that she has missed meals because there is no one available to push her to the dining room. She is currently unable to propel herself in the non-motorized wheelchair without her oxygen levels dropping and becoming short of breath. She is requesting a motorized wheelchair to assist her in her daily activities.  She has experienced 2 falls in the last 2 months secondary to unsteady gait and desaturation with exertion.   Allergies  Allergen Reactions  . Codeine     REACTION: makes pt pass out  . Levaquin [Levofloxacin In D5w] Other (See Comments)    redness  . Sulfonamide Derivatives     REACTION: edema   Current Outpatient Prescriptions on File Prior to Visit  Medication Sig Dispense Refill  . albuterol (PROAIR HFA) 108 (90 BASE) MCG/ACT inhaler Inhale 2 puffs into the lungs every 6 (six) hours as needed for wheezing or shortness of breath. INHALE 2 PUFFS INTO THE LUNGS EVERY 6 (SIX) HOURS AS NEEDED FOR WHEEZING. 8.5 each 2  . albuterol (PROVENTIL)  (2.5 MG/3ML) 0.083% nebulizer solution Take 3 mLs (2.5 mg total) by nebulization 3 (three) times daily. DX 496 360 mL 6  . ALPRAZolam (XANAX) 0.5 MG tablet Take 0.5-1 tablets (0.25-0.5 mg total) by mouth 2 (two) times daily as needed. 60 tablet 3  . aspirin 81 MG tablet Take 1 tablet (81 mg total) by mouth daily. 30 tablet 6  . atorvastatin (LIPITOR) 40 MG tablet Take 1 tablet (40 mg total) by mouth daily. 90 tablet 3  . benzonatate (TESSALON) 100 MG capsule Take 100 mg by mouth every 8 (eight) hours as needed. For cough    . esomeprazole (NEXIUM) 40 MG capsule TAKE 1 CAPSULE BY MOUTH ONCE A DAY 30 capsule 5  . furosemide (LASIX) 40 MG tablet Take 1 tablet (40 mg total) by mouth 2 (two) times daily. 180 tablet 1  . glucose blood (ONE TOUCH ULTRA TEST) test strip CHECK BLOOD SUGAR TWO TIMES A DAY DX: 250.00 100 each 3  . Lancets MISC Use as directed to test blood sugar twice daily. Dx: 250.00 100 each 0  . metFORMIN (GLUCOPHAGE-XR) 500 MG 24 hr tablet TAKE 1 TABLET BY MOUTH TWICE A DAY 60 tablet 5  . ONE TOUCH ULTRA TEST test strip CHECK BLOOD SUGAR TWO TIMES A DAY DX: 250.00 50 each 1  . potassium chloride SA (K-DUR,KLOR-CON) 20 MEQ tablet TAKE 1 TABLET (20 MEQ TOTAL) BY MOUTH 2 (TWO) TIMES DAILY. 180 tablet 4  . tiotropium (SPIRIVA HANDIHALER) 18 MCG inhalation  capsule Place 1 capsule (18 mcg total) into inhaler and inhale daily. 30 capsule 5  . traMADol (ULTRAM) 50 MG tablet Take 1 tablet (50 mg total) by mouth every 6 (six) hours as needed. 30 tablet 4   No current facility-administered medications on file prior to visit.   Past Medical History  Diagnosis Date  . Breast cancer, right breast 1998    s/p chemo & XRT, right mastectomy  . OBESITY   . CORONARY ARTERY DISEASE   . APHTHOUS STOMATITIS   . DEPRESSION     started sertraline 09/2010  . DIABETES, TYPE 2 dx 03/2010  . HYPERLIPIDEMIA   . Hypothyroidism   . C O P D     chronic O2 3LPM [place]  . Essential tremor   . NSTEMI (non-ST  elevated myocardial infarction) 01/2012    med mgmt    Review of Systems    See HPI Objective:    BP 142/60 mmHg  Pulse 111  Temp(Src) 98.7 F (37.1 C) (Oral)  Resp 22  Ht 5\' 2"  (1.575 m)  Wt 210 lb (95.255 kg)  BMI 38.40 kg/m2  SpO2 91% Nursing note and vital signs reviewed.  Physical Exam  Constitutional: She is oriented to person, place, and time. She appears well-developed and well-nourished. No distress.  Cardiovascular: Normal rate, regular rhythm and normal heart sounds.   Edema noted bilateral lower extremities.  Pulmonary/Chest: Effort normal. She has wheezes.  SpO2 seated at rest, with 3 L of oxygen: 92% SpO2 transition from sit to stand, with 3 L of oxygen: 89% SpO2 with exertion and walking, with 3 L of oxygen: 86%  Oxygen desaturation with exertion. Patient becomes very short of breath and winded.  Musculoskeletal:  Upper extremities: Exhibits full range of motion of upper extremities with pain. Strength is 4+ out of 5 throughout.  Lower extremities: exhibits full range of motion of lower extremities. Strength is 4+ out of 5 throughout  Neurological: She is alert and oriented to person, place, and time. No cranial nerve deficit.  Romberg sign is positive indicating unsteadiness.  Skin: Skin is warm and dry.  Psychiatric: She has a normal mood and affect. Her behavior is normal. Judgment and thought content normal.       Assessment & Plan:

## 2014-08-15 NOTE — Progress Notes (Deleted)
   Subjective:    Patient ID: Kaitlin Henderson, female    DOB: October 16, 1940, 73 y.o.   MRN: [id number]  Chief Complaint  Patient presents with  . Follow-up    Wheelchair    HPI:  Kaitlin Henderson is a 73 y.o. female who presents today    Review of Systems     Objective:    BP 142/60 mmHg  Pulse 111  Temp(Src) 98.7 F (37.1 C) (Oral)  Resp 22  Ht   Wt   SpO2 91% Nursing note and vital signs reviewed.  Physical Exam     Assessment & Plan:

## 2014-08-15 NOTE — Assessment & Plan Note (Addendum)
Patient with decreased mobility secondary to oxygen desaturation with exertion related to chronic respiratory failure and COPD with emphysema. Recommend power wheelchair for lifetime use and abilities to perform activities of daily living.  Patient has inability to use cane or walker secondary to unsteady gait and oxygen desaturation with exertion. Demonstrates inability to use manual wheelchair secondary to 9/10 shoulder pain and oxygen desaturation with exertion. A scooter is inappropriate secondary to home environment not providing adequate access for maneuvering. Patient has the physical and mental abilities to operate a power wheelchair safely in the home and is willing and motivated to use the power wheelchair. Recommend O2 tank holder on the power wheelchair secondary to 24 hour oxygen requirements.

## 2014-08-18 ENCOUNTER — Telehealth: Payer: Self-pay | Admitting: Family

## 2014-08-18 NOTE — Telephone Encounter (Signed)
All of the paperwork was signed and sent to [place] the following day - she will have to follow up with them.

## 2014-08-18 NOTE — Telephone Encounter (Signed)
Patient wants to know what happened to her wheelchair

## 2014-08-19 ENCOUNTER — Telehealth: Payer: Self-pay | Admitting: Critical Care Medicine

## 2014-08-19 ENCOUNTER — Telehealth: Payer: Self-pay

## 2014-08-19 NOTE — Telephone Encounter (Signed)
Has been sent.

## 2014-08-19 NOTE — Telephone Encounter (Signed)
[facility] called and stated that the encounter for the notes weren't closed and needed to be signed and sent for anything to be finished.

## 2014-08-19 NOTE — Telephone Encounter (Signed)
Called spoke with pt. She reports she has been on prednisone for many years now. She reports she currently take pred 10mg  daily. This is not on her current medication list but per pt she was told by [place] she could never go off this. Pt is wanting to know when she can come off this. Please advise thanks

## 2014-08-19 NOTE — Telephone Encounter (Signed)
Called spoke with pt. Made aware of below. Nothing further needed 

## 2014-08-19 NOTE — Telephone Encounter (Signed)
Paperwork reprinted and resigned.

## 2014-08-19 NOTE — Telephone Encounter (Signed)
Needs to be slowly tapered.  Suggest start by going to 10mg  every other day and stay

## 2014-08-30 ENCOUNTER — Telehealth: Payer: Self-pay | Admitting: Internal Medicine

## 2014-08-30 NOTE — Telephone Encounter (Signed)
Patient is requesting a sample of an inhaler that Marya Amsler gave her last time she was in.

## 2014-09-05 ENCOUNTER — Ambulatory Visit: Admitting: Family

## 2014-09-07 ENCOUNTER — Ambulatory Visit: Admitting: Internal Medicine

## 2014-09-08 ENCOUNTER — Encounter: Payer: Self-pay | Admitting: Internal Medicine

## 2014-09-08 ENCOUNTER — Ambulatory Visit (INDEPENDENT_AMBULATORY_CARE_PROVIDER_SITE_OTHER): Payer: Medicare Other | Admitting: Internal Medicine

## 2014-09-08 VITALS — BP 110/78 | HR 100 | Temp 97.7°F | Resp 18

## 2014-09-08 DIAGNOSIS — R2689 Other abnormalities of gait and mobility: Secondary | ICD-10-CM

## 2014-09-08 MED ORDER — ALBUTEROL SULFATE HFA 108 (90 BASE) MCG/ACT IN AERS: 2.0000 | INHALATION_SPRAY | Freq: Four times a day (QID) | RESPIRATORY_TRACT | Status: DC | PRN

## 2014-09-08 NOTE — Progress Notes (Signed)
Subjective:    Patient ID: Kaitlin Henderson, female    DOB: December 15, 1940, 74 y.o.   MRN: [id number]  HPI Kaitlin Henderson is a 74 y.o. female who presents today for a mobility exam for a powered wheelchair. She was recently seen for same and some of the history is copied from that exam and verified during today's visit.   She is currently a resident of [place]. She has been having increased difficulty with shortness of breath which has affected her daily life. Currently she requires continuous oxygen at 3L via nasal cannula. She is unable to ambulate with the oxygen and due to her severe dyspnea is unable to propel her manual wheelchair .  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   She has difficulty performing activities of daily living secondary to oxygen desaturation with exertion. Activities effected include moving room to room, feeding (limited secondary to access to food as well as dyspnea with eating), and toileting. She expresses that she has missed meals because there is no one available to push her to the dining room. She is currently unable to propel herself in the non-motorized wheelchair without her oxygen levels dropping and becoming short of breath. She is requesting a motorized wheelchair to assist her in her daily activities.  She has experienced 2 falls in the last 2 months secondary to unsteady gait and desaturation with exertion.   Her cognition is good and she is able to think clearly and is capable of using a joystick to maneuver an electric wheelchair.   Review of Systems  Constitutional: Positive for activity change, appetite change and fatigue. Negative for fever, chills and unexpected weight change.  Respiratory: Positive for cough, shortness of breath and wheezing. Negative for chest tightness.   Cardiovascular: Positive for leg swelling. Negative for chest pain and  palpitations.  Gastrointestinal: Negative for nausea, abdominal pain, diarrhea, constipation and abdominal distention.  Musculoskeletal: Positive for myalgias and gait problem. Negative for arthralgias and neck pain.  Neurological: Positive for dizziness and weakness. Negative for light-headedness, numbness and headaches.  Psychiatric/Behavioral: Negative for suicidal ideas, hallucinations, sleep disturbance, dysphoric mood, decreased concentration and agitation. The patient is not nervous/anxious.       Objective:   Physical Exam  Constitutional: She is oriented to person, place, and time. She appears well-developed and well-nourished. No distress.  Eyes: EOM are normal.  Neck: Normal range of motion.  Cardiovascular: Normal rate, regular rhythm and normal heart sounds.   Edema noted bilateral lower extremities.  Pulmonary/Chest: Effort normal. She has wheezes.  SpO2 seated at rest, with 3 L of oxygen: 92% SpO2 transition from sit to stand, with 3 L of oxygen: 89% SpO2 with exertion and walking, with 3 L of oxygen: 86%  Oxygen desaturation with exertion. Patient becomes very short of breath and winded.  Normal conversation makes her dyspneic.   Musculoskeletal:  Upper extremities: Exhibits full range of motion of upper extremities with pain. Strength is 4+ out of 5 throughout. Coordination is normal.  Lower extremities: exhibits full range of motion of lower extremities. Strength is 4+ out of 5 throughout  Neurological: She is alert and oriented to person, place, and time. No cranial nerve deficit.  Romberg sign is positive indicating unsteadiness.  Skin: Skin is warm and dry.  Psychiatric: Her behavior is normal. Judgment and thought content normal.   Filed Vitals:   09/08/14 1012  BP:  110/78  Pulse: 100  Temp: 97.7 F (36.5 C)  TempSrc: Oral  Resp: 18  SpO2: 92%      Assessment & Plan:

## 2014-09-08 NOTE — Assessment & Plan Note (Signed)
Patient with decreased mobility secondary to oxygen desaturation with exertion related to chronic respiratory failure and COPD with emphysema. Recommend power wheelchair for lifetime use and abilities to perform activities of daily living. Cognition intact and able to use the controls for a motorized wheelchair.   Patient has inability to use cane or walker secondary to unsteady gait and oxygen desaturation with exertion. Demonstrates inability to use manual wheelchair secondary to 9/10 shoulder pain and oxygen desaturation with exertion. A scooter is inappropriate secondary to home environment not providing adequate access for maneuvering. Patient has the physical and mental abilities to operate a power wheelchair safely in the home and is willing and motivated to use the power wheelchair. Recommend O2 tank holder on the power wheelchair secondary to 24 hour oxygen requirements.  This will be helpful for her proper nutrition as well as socialization and decreasing her current isolation which is causing some distress to her.

## 2014-09-08 NOTE — Patient Instructions (Signed)
We will fill out the papers to get you the electric wheelchair. We have sent in a refill of the albuterol inhaler.   If you do not hear about the wheelchair by next week please call us back to make sure everything went through.

## 2014-09-08 NOTE — Progress Notes (Signed)
Pre visit review using our clinic review tool, if applicable. No additional management support is needed unless otherwise documented below in the visit note. 

## 2014-09-09 ENCOUNTER — Telehealth: Payer: Self-pay | Admitting: Internal Medicine

## 2014-09-09 NOTE — Telephone Encounter (Signed)
Tonya calling from [facility]

## 2014-10-11 ENCOUNTER — Telehealth: Payer: Self-pay

## 2014-10-11 NOTE — Telephone Encounter (Signed)
[place] form signed and mailed back.   Sent to have Charge Sheet Attached.

## 2014-10-26 ENCOUNTER — Telehealth: Payer: Self-pay | Admitting: Internal Medicine

## 2014-10-26 ENCOUNTER — Telehealth: Payer: Self-pay | Admitting: Critical Care Medicine

## 2014-10-26 NOTE — Telephone Encounter (Signed)
Pt needs ov with [place] or her primary md to address this.

## 2014-10-26 NOTE — Telephone Encounter (Signed)
Spoke with Kaitlin Henderson, states her legs/feet are swollen X1 week.  Kaitlin Henderson's daughter in law told her that her father swelled up like this when he had CHF and now patient is worried that she has CHF.  Kaitlin Henderson has no cardiologist.  Does take Lasix 40mg  bid according to her med list.  Is requesting recs.  Kaitlin Henderson does not know the name of her new pharmacy.    Dr. Joya Gaskins please advise.  Thank you.

## 2014-10-26 NOTE — Telephone Encounter (Signed)
Pt called in said that she feels like she is having congestive heart feller and want to see Dr Asa Lente only.  There are no openings for quite a while.  She wanted to know if she can be worked in?

## 2014-10-26 NOTE — Telephone Encounter (Signed)
Jess please advise if we can use a held spot for TP scheduled tomorrow morning.  thanks

## 2014-10-26 NOTE — Telephone Encounter (Signed)
Spoke with pt, she states she will call Dr. Asa Lente to be seen for this.  Nothing further needed at this time.

## 2014-10-26 NOTE — Telephone Encounter (Signed)
Dr. Joya Gaskins is unavailable, will send to doc of day.   [place] please advise.

## 2014-10-27 ENCOUNTER — Telehealth: Payer: Self-pay | Admitting: Internal Medicine

## 2014-10-27 NOTE — Telephone Encounter (Signed)
DId not have openings for right then when called.  She did not want an afternoon appointment.  Transferred [facility] to [facility].

## 2014-10-27 NOTE — Telephone Encounter (Signed)
EASE NOTE: All timestamps contained within this report are represented as [place] Standard Time. CONFIDENTIALTY NOTICE: This fax transmission is intended only for the addressee. It contains information that is legally privileged, confidential or otherwise protected from use or disclosure. If you are not the intended recipient, you are strictly prohibited from reviewing, disclosing, copying using or disseminating any of this information or taking any action in reliance on or regarding this information. If you have received this fax in error, please notify us immediately by telephone so that we can arrange for its return to [place]. Phone: [phone], Toll-Free: [phone], Fax: [phone] Page: 1 of 1 Call Id: [id number] [facility] [place] Patient Name: Kaitlin Henderson DOB: [birth year] Initial Comment Caller states his feet are swelled, Trouble breathing. Nurse Assessment Nurse: Erlene Quan, RN, Manuela Schwartz Date/Time Eilene Ghazi Time): 10/27/2014 9:19:31 AM Confirm and document reason for call. If symptomatic, describe symptoms. ---Caller states his feet are swelled horrible , Trouble breathing - started about 2 weeks ago and she was thinking it would get better but it hasn't - she was dx with COPD about a year ago - she is out of her inhalers she is out of all her medications she does not have the money to buy them - she has been out of medications for over a month -caller is crying and upset states her daughter in law told her that her father died because of this , she has family problems and no assistance from children Has the patient traveled out of the country within the last 30 days? ---Not Applicable Does the patient require triage? ---Yes Related visit to physician within the last 2 weeks? ---No Does the PT have any chronic conditions? (i.e. diabetes, asthma, etc.) ---Yes List chronic conditions. ---COPD multiple other  health issues Guidelines Guideline Title Affirmed Question Affirmed Notes Breathing Difficulty [1] MODERATE difficulty breathing (e.g., speaks in phrases, SOB even at rest, pulse 100-120) AND [2] NEW-onset or WORSE than normal Final Disposition User Go to ED Now Erlene Quan, RN, Manuela Schwartz Comments Advised caller while she is at the ER she needs to ask to speak to a Education officer, museum or someone who can assist her in finding resources for assistance in her home and also with her medications - she verbalized understanding

## 2014-10-27 NOTE — Telephone Encounter (Signed)
Mathews Robinsons (manger or the apartments where she lives) her number is [phone] She has stated that EMT came out today.  Per EMT legs are swollen because pt is not taking Fluild pill and her suger meds.  She will not take her meds like she is suppose to.  Her sugar was 140 She said that she just sits in her room as refuses help from anyone.  She has family that are willing to help but she refuses to call them or ask them to help with anything.  Manuela Schwartz also said that there is a very back order in her apartment and EMT was not sure if it was coming from waste being deposed improperly or she had a really bad UTI.  Manuela Schwartz stated that they have on site skilled health and skilled nursing. ([facility]).  They have all tried to help her and help her get on track with meds but she is not complaint.  .  She stated it was a very frustrating situation and she can not get her to tell her the truth about things so she can help the way she needs.  She just wanted to call for [facility]

## 2014-10-27 NOTE — Telephone Encounter (Signed)
EASE NOTE: All timestamps contained within this report are represented as [place] Standard Time. CONFIDENTIALTY NOTICE: This fax transmission is intended only for the addressee. It contains information that is legally privileged, confidential or otherwise protected from use or disclosure. If you are not the intended recipient, you are strictly prohibited from reviewing, disclosing, copying using or disseminating any of this information or taking any action in reliance on or regarding this information. If you have received this fax in error, please notify us immediately by telephone so that we can arrange for its return to [place]. Phone: [phone], Toll-Free: [phone], Fax: [phone] Page: 1 of 1 Call Id: [id number] [facility] - Client [place] Patient Name: Kaitlin Henderson DOB: May 28, 1941 Initial Comment Caller states his feet are swelled, Trouble breathing. Nurse Assessment Nurse: Erlene Quan, RN, Manuela Schwartz Date/Time Eilene Ghazi Time): 10/27/2014 9:19:31 AM Confirm and document reason for call. If symptomatic, describe symptoms. ---Caller states his feet are swelled horrible , Trouble breathing - started about 2 weeks ago and she was thinking it would get better but it hasn't - she was dx with COPD about a year ago - she is out of her inhalers she is out of all her medications she does not have the money to buy them - she has been out of medications for over a month -caller is crying and upset states her daughter in law told her that her father died because of this , she has family problems and no assistance from children Has the patient traveled out of the country within the last 30 days? ---Not Applicable Does the patient require triage? ---Yes Related visit to physician within the last 2 weeks? ---No Does the PT have any chronic conditions? (i.e. diabetes, asthma, etc.) ---Yes List chronic conditions. ---COPD multiple other  health issues Guidelines Guideline Title Affirmed Question Affirmed Notes Breathing Difficulty [1] MODERATE difficulty breathing (e.g., speaks in phrases, SOB even at rest, pulse 100-120) AND [2] NEW-onset or WORSE than normal Final Disposition User Go to ED Now Erlene Quan, RN, Manuela Schwartz Comments Advised caller while she is at the ER she needs to ask to speak to a Education officer, museum or someone who can assist her in finding resources for assistance in her home and also with her medications - she verbalized understanding

## 2014-10-27 NOTE — Telephone Encounter (Signed)
Sx: sick to stomach, swelling in both feet and legs, sob (constant sx due to copd), no chest pains.   Pt is calling back. She is going to see if she can a ride into the office today. Pls schedule her with someone in the office.

## 2014-10-27 NOTE — Telephone Encounter (Signed)
FYI noted. Thanks Difficult situation - described behavior in keeping with my prior observations. No changes recommended by me. Thanks for the note

## 2014-10-31 ENCOUNTER — Ambulatory Visit: Payer: Medicare Other | Admitting: Adult Health

## 2014-10-31 ENCOUNTER — Telehealth: Payer: Self-pay | Admitting: Critical Care Medicine

## 2014-10-31 NOTE — Telephone Encounter (Signed)
She needs to come to office to be evaluated.

## 2014-10-31 NOTE — Telephone Encounter (Signed)
Spoke with pt, c/o retaining fluid in her legs and feet, since Friday.  Also c/o sob with exertion, occasional chest discomfort, prod cough with white mucus, no fever.  Also having difficulty urinating.  Pt is wanting to be put in a nursing home.  Uses [facility].    Sending to doc of day as PW is on vacation.    Dr. Halford Chessman please advise on recs.   Thanks!

## 2014-10-31 NOTE — Telephone Encounter (Signed)
Per TP  - ok to schedule at 3:15pm with her.  Advised pt of this and advised her that we are working her in so there may be a wait time.

## 2014-11-11 ENCOUNTER — Telehealth: Payer: Self-pay | Admitting: Critical Care Medicine

## 2014-11-11 NOTE — Telephone Encounter (Signed)
lmomtcb x1 

## 2014-11-14 NOTE — Telephone Encounter (Signed)
lmtcb x2 

## 2014-11-15 NOTE — Telephone Encounter (Signed)
lmomtcb x 3  

## 2014-11-16 NOTE — Telephone Encounter (Signed)
lmtcb

## 2014-11-17 ENCOUNTER — Telehealth: Payer: Self-pay | Admitting: Critical Care Medicine

## 2014-11-17 NOTE — Telephone Encounter (Signed)
lmtcb for [employer] Per triage protocol-will sign off.

## 2014-11-17 NOTE — Telephone Encounter (Signed)
LMTCB for [name] 

## 2014-11-18 NOTE — Telephone Encounter (Signed)
Called and spoke to [place]. Larene Beach stated pt is becoming immobile and not wanting to get out of bed or use her electric chair. Larene Beach is requesting we call the pt to assure her the staff at [facility] are there to help her. Called and spoke to pt. Informed her to allow the staff to help with mobility and therapy. Pt stated "you dont know these people and they have been filling you head full of malarkey". Informed pt that staff is there to help her. Pt continued to deny the help. Advised pt she is due for f/u with PW and he will be able to shed some light as well. Appt made with PW on 12/06/14. Pt verbalized understanding and denied any further questions or concerns at this time.

## 2014-11-23 ENCOUNTER — Telehealth: Payer: Self-pay | Admitting: Critical Care Medicine

## 2014-11-23 NOTE — Telephone Encounter (Signed)
Physical therapist evaluated patient today.  Asked her about her medications and she said that she was unclear about her medications. Patient sat on the edge of her bed for 2-3 minutes and said she was tired and laid back down.  She has a hard time with a dry cough and unable to catch her breath.  She lives in independent community but she refuses to leave her apartment for anything, they deliver her meals to her.  Her left foot, calf is swollen and slightly pink. Odor in apartment, patient is not bathing.  We have not seen patient since 04/04/2014.  Physical Therapist thinks she may need to be placed in home health.  She said that she is not taking care of herself.    PW - please advise as to what to do?

## 2014-11-24 NOTE — Telephone Encounter (Signed)
Would defer to pts pcp on this issue for placement She has longstanding depression/anxiety

## 2014-11-24 NOTE — Telephone Encounter (Signed)
lmtcb x1 for [employer]

## 2014-11-28 ENCOUNTER — Telehealth: Payer: Self-pay | Admitting: Internal Medicine

## 2014-11-28 NOTE — Telephone Encounter (Signed)
Called Larene Beach (Out Patient Therapist) back.  Question: Therapist wanted to know if the pt should do pulmonary therapy, PT, OT, exercises and/or RN for medication education in light of recent events. (below has all of the details).   I did let Larene Beach know that pt needs an appt to be seen for the fall.    Larene Beach stated "Pt wanted orders from her pulmonologist and PCP to do PT. Site requested that pt try to do things on her own after stating that meals will not be given for free and brought to her. The first evaluation found that HCTZ was not being taken as directed. Fast forward to recent days: Evaluation was done (after pt finally agreed to work with therapist). Then, pt falls on Thursday and when she was found she had been in the floor for 10 hours. Pt was sore from the fall and being on the floor for so long and also has a knot from the fall as well. EMS was called and stayed until pt was able to walk from her chair to her walker. Education only orders were given that day from another MD. Pt states that she is now taking lasix and that she wants to start doing the therapies."

## 2014-11-28 NOTE — Telephone Encounter (Signed)
[facility] calling needing to speak to nurse about this pt.Kaitlin Henderson

## 2014-11-28 NOTE — Telephone Encounter (Signed)
Spoke with Shelda Pal and notified of recs per PW  She verbalized understanding  Will contact the PCP

## 2014-11-28 NOTE — Telephone Encounter (Signed)
lmtcb x2 for [place]. 

## 2014-11-28 NOTE — Telephone Encounter (Signed)
[phone] cell - Larene Beach from [employer] at [facility] that [place] [facility]. Could you please call when you get a chance on her cell. A little too much to tell me whats going on so she thought it would be best she tell you.

## 2014-11-28 NOTE — Telephone Encounter (Signed)
eliza can be reached @ [phone].Hillery Hunter'

## 2014-11-29 NOTE — Telephone Encounter (Signed)
Called pt and made appt for next week to have leg soreness evaluated.   Georgina Quint (OT therapist) and advised of plan at appt and also PCP response to the type of assistance she would like the pt to have.

## 2014-11-29 NOTE — Telephone Encounter (Signed)
Agree it is ok for pt to continue all therapy as ordered thanks

## 2014-11-30 ENCOUNTER — Telehealth: Payer: Self-pay | Admitting: Internal Medicine

## 2014-11-30 ENCOUNTER — Telehealth: Payer: Self-pay | Admitting: Critical Care Medicine

## 2014-11-30 NOTE — Telephone Encounter (Signed)
Patient is calling and said that [facility] (I think)  said Dr. Asa Lente is the only one who can get her help get into nursing home. Her legs are so swollen that it is difficult to walk.

## 2014-11-30 NOTE — Telephone Encounter (Signed)
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 If symptomatic, describe symptoms. 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 Looks SOB, is on O2 at 3 L. Seems fine otherwise. Has the patient traveled out of the country within the last 30 days? ---Not Applicable Does the patient require triage? ---Yes Related visit to physician within the last 2 weeks? ---No Does the PT have any chronic conditions? (i.e. diabetes, asthma, etc.) ---Yes List chronic conditions. ---CHF, unknown Guidelines Guideline Title Affirmed Question Affirmed Notes High Blood Pressure [1] BP # 140/90 AND [2] taking BP medications Final Disposition User See PCP within [place], RN, Kermit Balo Comments Caller is with physical therapy. States the patient is back at her apartment, but they had concerns that her BP was elevated from usual. Patient has scheduled appts. to see her physicians on the 5th of April. Is on BP rx., may have COPD. They are seeing her solely for vital signs & educational purposes, no exercises until she is seen by her Dr. Deborha Payment said that the pt. looks SOB, but that is her baseline, is on O2.

## 2014-11-30 NOTE — Telephone Encounter (Signed)
Patient says she wants to be placed in a nursing home.  Patient says she does not feel well at all, she was coughing a lot on the phone.  She says that she wants to be in a place that can give her around the clock care so she can feel better.    PW - what do you recommend?

## 2014-11-30 NOTE — Telephone Encounter (Signed)
Patient notified.  She will contact her PCP.  Nothing further needed.

## 2014-11-30 NOTE — Telephone Encounter (Signed)
i would agree with this,  Would suggest PCP MD take the lead on this

## 2014-11-30 NOTE — Telephone Encounter (Signed)
Spoke to pt. Pt stated that she is feeling fine (this is in regards to the [facility] note that came through). Pt wants to go to a full Nursing facility. Pt states that she needs help with getting a shower and other personal grooming hygiene. Pt stated that she has not had a shower in 4 weeks. Also, that other residents and staff are telling her that she stinks.  Pt also wants her appt with Dr. Joya Gaskins @ pulmonology. It was cancelled. I have added the appt back. Pt contacted regarding and expressed the importance of keeping the appts that she has and that we would do everything that we can to get her to facility that she could receive help with her needs.

## 2014-12-01 NOTE — Telephone Encounter (Signed)
1st attempt: called and lvm with [place] [phone])

## 2014-12-01 NOTE — Telephone Encounter (Signed)
Monica called back. She is with the benefits and eligibility services.   Transfer to [place] and she is sending over a fax form and is sending an email to a Wellsite geologist for further evaluation.

## 2014-12-01 NOTE — Telephone Encounter (Signed)
Please contact social worker at [facility] to assist [place] with this request

## 2014-12-05 ENCOUNTER — Ambulatory Visit: Payer: Medicare Other | Admitting: Internal Medicine

## 2014-12-05 DIAGNOSIS — Z0289 Encounter for other administrative examinations: Secondary | ICD-10-CM

## 2014-12-05 DIAGNOSIS — R4689 Other symptoms and signs involving appearance and behavior: Secondary | ICD-10-CM | POA: Insufficient documentation

## 2014-12-06 ENCOUNTER — Ambulatory Visit: Payer: Medicare Other | Admitting: Internal Medicine

## 2014-12-06 ENCOUNTER — Ambulatory Visit: Payer: Medicare Other | Admitting: Critical Care Medicine

## 2014-12-06 ENCOUNTER — Telehealth: Payer: Self-pay | Admitting: *Deleted

## 2014-12-06 NOTE — Telephone Encounter (Signed)
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 If symptomatic, describe symptoms. 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 Looks SOB, is on O2 at 3 L. Seems fine otherwise. Has the patient traveled out of the country within the last 30 days? ---Not Applicable Does the patient require triage? ---Yes Related visit to physician within the last 2 weeks? ---No Does the PT have any chronic conditions? (i.e. diabetes, asthma, etc.) ---Yes List chronic conditions. ---CHF, unknown Guidelines Guideline Title Affirmed Question Affirmed Notes Nurse Date/Time (Eastern Time) High Blood Pressure [1] BP # 140/90 AND [2] taking BP medications Marcelline Deist, RN, Kermit Balo 11/30/2014 2:32:21 PM Disp. Time Eilene Ghazi Time) Disposition Final User 11/30/2014 2:25:53 PM Send to Urgent Queue Dalia Heading 11/30/2014 2:37:52 PM See PCP within 2 Weeks Yes Marcelline Deist, RN,  Kermit Balo PLEASE NOTE: All timestamps contained within this report are represented as [place] Standard Time. CONFIDENTIALTY NOTICE: This fax transmission is intended only for the addressee. It contains information that is legally privileged, confidential or otherwise protected from use or disclosure. If you are not the intended recipient, you are strictly prohibited from reviewing, disclosing, copying using or disseminating any of this information or taking any action in reliance on or regarding this information. If you have received this fax in error, please notify us immediately by telephone so that we can arrange for its return to [place]. Phone: [phone], Toll-Free: [phone], Fax: [phone] Page: 2 of 2 Call Id: [id number] Caller Understands: Yes Disagree/Comply: Comply Care Advice Given Per Guideline SEE PCP WITHIN 2 WEEKS: You need an evaluation for this ongoing problem within the next 2 weeks. Call your doctor during regular office hours and make an appointment. HYPERTENSION MEDICATIONS: CALL BACK IF: * Weakness or numbness of the face, arm or leg on one side of the body occurs * Difficulty walking, difficulty talking, or severe headache occurs * Chest pain or difficulty breathing occurs * Your blood pressure is over 160/100 * You become worse. CARE ADVICE given per High Blood Pressure (Adult) guideline. LIFESTYLE MODIFICATIONS - The following things can help you reduce your blood pressure: * EAT HEALTHY: Eat a diet rich in fresh fruits and vegetables, dietary fiber, non-animal protein (e.g., soy), and low-fat dairy products. Avoid foods with a high content of saturated fat or cholesterol. * DECREASE SODIUM INTAKE: Aim to eat less than 1,500 mg of sodium each day. Unfortunately 75% of the salt in the average person's diet is in pre-processed foods. * LIMIT ALCOHOL: Limit alcohol to 0-2 standard drinks each day. Men should have less than 14 dinks per week. Women should have less than 9  drinks per week. A drink is 1.5 oz hard liquor (one shot or jigger; 45 ml), 5 oz wine (small glass; 150 ml), 12 oz beer (one can; 360 ml). * REDUCE STRESS: Find activities that help reduce your stress. Examples might include meditation, yoga, or even a restful walk in a park. REASSURANCE: * Your blood pressure is elevated but you have told me that you are not having any symptoms. * You should see your doctor and have your blood pressure checked within 2 weeks. * The goal of blood pressure treatment for most patients with hypertension is to keep the blood pressure under 140/90

## 2014-12-09 ENCOUNTER — Telehealth: Payer: Self-pay | Admitting: Critical Care Medicine

## 2014-12-09 NOTE — Telephone Encounter (Signed)
lmtcb X1 for pt  

## 2014-12-12 NOTE — Telephone Encounter (Signed)
lmtcb x2 

## 2014-12-13 NOTE — Telephone Encounter (Signed)
lmtcb X3 for pt. 

## 2014-12-14 NOTE — Telephone Encounter (Signed)
LMTCB and will close per triage protocol 

## 2014-12-19 ENCOUNTER — Ambulatory Visit: Payer: Medicare Other | Admitting: Critical Care Medicine

## 2014-12-20 ENCOUNTER — Telehealth: Payer: Self-pay | Admitting: Critical Care Medicine

## 2014-12-20 MED ORDER — FLUTICASONE-SALMETEROL 250-50 MCG/DOSE IN AEPB: 1.0000 | INHALATION_SPRAY | Freq: Two times a day (BID) | RESPIRATORY_TRACT | Status: DC

## 2014-12-20 MED ORDER — TIOTROPIUM BROMIDE MONOHYDRATE 18 MCG IN CAPS: 18.0000 ug | ORAL_CAPSULE | Freq: Every day | RESPIRATORY_TRACT | Status: DC

## 2014-12-20 NOTE — Telephone Encounter (Signed)
i am ok with advair 250 one puff bid And the spiriva

## 2014-12-20 NOTE — Telephone Encounter (Signed)
Called and spoke to pt. Informed her of the addition of advair and a sample of advair has been left up front for pick up. Refilled both rx's. Pt verbalized understanding and denied any further questions or concerns at this time.

## 2014-12-20 NOTE — Telephone Encounter (Signed)
Spoke with pt, she is requesting spiriva handihaler samples as well as Advair.  I advised that according to our records pt has not been on Advair for several years.  She verified this and said that she wanted to restart the Advair.  Dr. Joya Gaskins are you ok with pt restarting advair?  If so, what strength?  Thanks!

## 2014-12-21 NOTE — Telephone Encounter (Signed)
- (  2 part note) -  Spoke the different sets of individuals are "workingPrintmaker.   Pt is opening up to Physical Therapy, Occupational Therapy, Social Work and to the MD making house calls.  Pt is making a lot of improvements in the she gets around, mood and all over well being. :)   Spoke to pt and she so excited that she was taken out last Friday and was taken "to parts of the [place] I've never seen".

## 2014-12-21 NOTE — Telephone Encounter (Signed)
Larene Beach called back. Larene Beach stated that Doctor Making House Calls where called out to pt. Final Dx was cellulitis. Pt also had a doppler study done at her residence to look at a possible DVT.  Larene Beach stated that her leg is looking better since abx were started. Pt is using [facility] for the "primary" needs until transportation issues can be straightened out.   Will call [facility] at [phone] to get records for her chart.

## 2014-12-21 NOTE — Telephone Encounter (Signed)
LVM for Kaitlin Henderson at [facility] to call back  Pt missed last appointment for swelling and appt with pulmonary.

## 2014-12-22 ENCOUNTER — Telehealth: Payer: Self-pay | Admitting: Critical Care Medicine

## 2014-12-22 NOTE — Telephone Encounter (Signed)
Pt states that she cannot afford her Proair Rx and has been using samples and is now out. Pt aware that we no longer have samples of this given to our office.  Offered to see if an alternative could be called in (Proventil/Ventolin) and pt refused stating that she will not be able to afford those either. States that she is using her Advair as directed.  Would like rec's from PW of something else like she might benefit from that will be more cost efficient. Pt is not in any distress Please advise Dr Joya Gaskins. Thanks.

## 2014-12-23 MED ORDER — ALBUTEROL SULFATE (2.5 MG/3ML) 0.083% IN NEBU: 2.5000 mg | INHALATION_SOLUTION | Freq: Three times a day (TID) | RESPIRATORY_TRACT | Status: DC

## 2014-12-23 NOTE — Telephone Encounter (Signed)
Called and spoke to pt. Informed pt of the recs per PW. Rx sent to pharmacy. Pt verbalized understanding and stated she would have some one bring her neb machine to the facility she is at right now. Nothing further needed at this time.

## 2014-12-23 NOTE — Telephone Encounter (Signed)
i would rec use of albuterol by nebulizer which is less expensive and she has Rx

## 2014-12-23 NOTE — Telephone Encounter (Signed)
lmtcb x1 

## 2014-12-23 NOTE — Telephone Encounter (Signed)
Pt returned call - [phone]

## 2014-12-26 ENCOUNTER — Telehealth: Payer: Self-pay | Admitting: Internal Medicine

## 2014-12-26 NOTE — Telephone Encounter (Signed)
Spoke to [name] and informed that there are no orders to stop taking Metformin.

## 2014-12-26 NOTE — Telephone Encounter (Signed)
Kaitlin Henderson is calling to advise that MD told her NOT to take her metformin any longer. She verified that house call doctor did nto say this, and wants to make sure that we did not tell her this. Please call to advise.

## 2015-02-01 ENCOUNTER — Telehealth: Payer: Self-pay | Admitting: Geriatric Medicine

## 2015-02-01 NOTE — Telephone Encounter (Signed)
I called patient to see if she has had a mammogram recently. Patient only has one breast, but she does not get mammograms anymore.

## 2015-02-10 ENCOUNTER — Ambulatory Visit: Payer: Medicare Other | Admitting: Cardiovascular Disease

## 2015-02-14 ENCOUNTER — Telehealth: Payer: Self-pay

## 2015-02-14 NOTE — Telephone Encounter (Signed)
Pt is not going in she will call back to make an appt

## 2015-04-26 ENCOUNTER — Encounter: Payer: Medicare Other | Admitting: Internal Medicine

## 2015-11-02 ENCOUNTER — Encounter: Payer: Self-pay | Admitting: Radiology

## 2015-11-02 ENCOUNTER — Emergency Department: Payer: Medicare Other

## 2015-11-02 ENCOUNTER — Inpatient Hospital Stay: Payer: Medicare Other

## 2015-11-02 ENCOUNTER — Inpatient Hospital Stay
Admission: EM | Admit: 2015-11-02 | Discharge: 2015-11-09 | DRG: 189 | Disposition: A | Discharge status: Skilled Nursing Facility | Payer: Medicare Other | Attending: Internal Medicine | Admitting: Internal Medicine

## 2015-11-02 DIAGNOSIS — E039 Hypothyroidism, unspecified: Secondary | ICD-10-CM | POA: Diagnosis present

## 2015-11-02 DIAGNOSIS — Z9049 Acquired absence of other specified parts of digestive tract: Secondary | ICD-10-CM | POA: Diagnosis not present

## 2015-11-02 DIAGNOSIS — F419 Anxiety disorder, unspecified: Secondary | ICD-10-CM | POA: Diagnosis present

## 2015-11-02 DIAGNOSIS — Z886 Allergy status to analgesic agent status: Secondary | ICD-10-CM | POA: Diagnosis not present

## 2015-11-02 DIAGNOSIS — J9601 Acute respiratory failure with hypoxia: Secondary | ICD-10-CM | POA: Diagnosis not present

## 2015-11-02 DIAGNOSIS — L03116 Cellulitis of left lower limb: Secondary | ICD-10-CM | POA: Diagnosis present

## 2015-11-02 DIAGNOSIS — Z90710 Acquired absence of both cervix and uterus: Secondary | ICD-10-CM | POA: Diagnosis not present

## 2015-11-02 DIAGNOSIS — Z853 Personal history of malignant neoplasm of breast: Secondary | ICD-10-CM | POA: Diagnosis not present

## 2015-11-02 DIAGNOSIS — J9621 Acute and chronic respiratory failure with hypoxia: Secondary | ICD-10-CM | POA: Diagnosis not present

## 2015-11-02 DIAGNOSIS — I2510 Atherosclerotic heart disease of native coronary artery without angina pectoris: Secondary | ICD-10-CM | POA: Diagnosis present

## 2015-11-02 DIAGNOSIS — R197 Diarrhea, unspecified: Secondary | ICD-10-CM | POA: Diagnosis present

## 2015-11-02 DIAGNOSIS — Z7982 Long term (current) use of aspirin: Secondary | ICD-10-CM | POA: Diagnosis not present

## 2015-11-02 DIAGNOSIS — Z9109 Other allergy status, other than to drugs and biological substances: Secondary | ICD-10-CM

## 2015-11-02 DIAGNOSIS — R0602 Shortness of breath: Secondary | ICD-10-CM | POA: Diagnosis not present

## 2015-11-02 DIAGNOSIS — E785 Hyperlipidemia, unspecified: Secondary | ICD-10-CM | POA: Diagnosis present

## 2015-11-02 DIAGNOSIS — Z87891 Personal history of nicotine dependence: Secondary | ICD-10-CM | POA: Diagnosis not present

## 2015-11-02 DIAGNOSIS — Z9981 Dependence on supplemental oxygen: Secondary | ICD-10-CM

## 2015-11-02 DIAGNOSIS — I252 Old myocardial infarction: Secondary | ICD-10-CM

## 2015-11-02 DIAGNOSIS — E119 Type 2 diabetes mellitus without complications: Secondary | ICD-10-CM | POA: Diagnosis present

## 2015-11-02 DIAGNOSIS — R0902 Hypoxemia: Secondary | ICD-10-CM

## 2015-11-02 DIAGNOSIS — J811 Chronic pulmonary edema: Secondary | ICD-10-CM | POA: Diagnosis present

## 2015-11-02 DIAGNOSIS — K219 Gastro-esophageal reflux disease without esophagitis: Secondary | ICD-10-CM | POA: Diagnosis present

## 2015-11-02 DIAGNOSIS — K121 Other forms of stomatitis: Secondary | ICD-10-CM | POA: Diagnosis present

## 2015-11-02 DIAGNOSIS — Z9221 Personal history of antineoplastic chemotherapy: Secondary | ICD-10-CM

## 2015-11-02 DIAGNOSIS — F329 Major depressive disorder, single episode, unspecified: Secondary | ICD-10-CM | POA: Diagnosis present

## 2015-11-02 DIAGNOSIS — R911 Solitary pulmonary nodule: Secondary | ICD-10-CM | POA: Diagnosis present

## 2015-11-02 DIAGNOSIS — J9622 Acute and chronic respiratory failure with hypercapnia: Secondary | ICD-10-CM | POA: Diagnosis present

## 2015-11-02 DIAGNOSIS — R918 Other nonspecific abnormal finding of lung field: Secondary | ICD-10-CM

## 2015-11-02 DIAGNOSIS — R0600 Dyspnea, unspecified: Secondary | ICD-10-CM | POA: Diagnosis not present

## 2015-11-02 DIAGNOSIS — J441 Chronic obstructive pulmonary disease with (acute) exacerbation: Secondary | ICD-10-CM | POA: Insufficient documentation

## 2015-11-02 DIAGNOSIS — Z9889 Other specified postprocedural states: Secondary | ICD-10-CM

## 2015-11-02 DIAGNOSIS — Z452 Encounter for adjustment and management of vascular access device: Secondary | ICD-10-CM

## 2015-11-02 DIAGNOSIS — Z9011 Acquired absence of right breast and nipple: Secondary | ICD-10-CM | POA: Diagnosis not present

## 2015-11-02 HISTORY — DX: Angina pectoris, unspecified: I20.9

## 2015-11-02 HISTORY — DX: Gastro-esophageal reflux disease without esophagitis: K21.9

## 2015-11-02 LAB — COMPREHENSIVE METABOLIC PANEL
ALT: 13 U/L — AB (ref 14–54)
ANION GAP: 9 (ref 5–15)
AST: 21 U/L (ref 15–41)
Albumin: 3.6 g/dL (ref 3.5–5.0)
Alkaline Phosphatase: 69 U/L (ref 38–126)
BUN: 15 mg/dL (ref 6–20)
CHLORIDE: 99 mmol/L — AB (ref 101–111)
CO2: 34 mmol/L — AB (ref 22–32)
Calcium: 8.9 mg/dL (ref 8.9–10.3)
Creatinine, Ser: 0.97 mg/dL (ref 0.44–1.00)
GFR calc non Af Amer: 56 mL/min — ABNORMAL LOW (ref >60)
Glucose, Bld: 138 mg/dL — ABNORMAL HIGH (ref 65–99)
POTASSIUM: 4 mmol/L (ref 3.5–5.1)
SODIUM: 142 mmol/L (ref 135–145)
Total Bilirubin: 1 mg/dL (ref 0.3–1.2)
Total Protein: 7.4 g/dL (ref 6.5–8.1)

## 2015-11-02 LAB — CBC WITH DIFFERENTIAL/PLATELET
Basophils Absolute: 0 10*3/uL (ref 0–0.1)
Basophils Relative: 1 %
EOS ABS: 0.1 10*3/uL (ref 0–0.7)
EOS PCT: 2 %
HCT: 46.9 % (ref 35.0–47.0)
Hemoglobin: 15.8 g/dL (ref 12.0–16.0)
LYMPHS ABS: 1.9 10*3/uL (ref 1.0–3.6)
Lymphocytes Relative: 25 %
MCH: 32.2 pg (ref 26.0–34.0)
MCHC: 33.6 g/dL (ref 32.0–36.0)
MCV: 95.8 fL (ref 80.0–100.0)
Monocytes Absolute: 0.6 10*3/uL (ref 0.2–0.9)
Monocytes Relative: 8 %
Neutro Abs: 5 10*3/uL (ref 1.4–6.5)
Neutrophils Relative %: 64 %
PLATELETS: 281 10*3/uL (ref 150–440)
RBC: 4.9 MIL/uL (ref 3.80–5.20)
RDW: 13.4 % (ref 11.5–14.5)
WBC: 7.8 10*3/uL (ref 3.6–11.0)

## 2015-11-02 LAB — LACTIC ACID, PLASMA
LACTIC ACID, VENOUS: 1 mmol/L (ref 0.5–2.0)
LACTIC ACID, VENOUS: 1.6 mmol/L (ref 0.5–2.0)

## 2015-11-02 LAB — HEPATIC FUNCTION PANEL
ALT: 13 U/L (ref 7–35)
AST: 21 U/L (ref 13–35)
Alkaline Phosphatase: 69 U/L (ref 25–125)
Bilirubin, Total: 1 mg/dL

## 2015-11-02 LAB — BASIC METABOLIC PANEL
BUN: 15 mg/dL (ref 4–21)
CREATININE: 1 mg/dL (ref 0.5–1.1)
GLUCOSE: 138 mg/dL
Potassium: 4 mmol/L (ref 3.4–5.3)
Sodium: 142 mmol/L (ref 137–147)

## 2015-11-02 LAB — PROTIME-INR
INR: 1.08
Prothrombin Time: 14.2 seconds (ref 11.4–15.0)

## 2015-11-02 LAB — CBC AND DIFFERENTIAL
HEMATOCRIT: 47 % — AB (ref 36–46)
Hemoglobin: 15.8 g/dL (ref 12.0–16.0)
PLATELETS: 281 10*3/uL (ref 150–399)
WBC: 7.8 10^3/mL

## 2015-11-02 LAB — TROPONIN I: TROPONIN I: 0.03 ng/mL (ref <0.031)

## 2015-11-02 LAB — MAGNESIUM: MAGNESIUM: 2.1 mg/dL (ref 1.7–2.4)

## 2015-11-02 LAB — GLUCOSE, CAPILLARY: Glucose-Capillary: 187 mg/dL — ABNORMAL HIGH (ref 65–99)

## 2015-11-02 LAB — LIPASE, BLOOD: Lipase: 16 U/L (ref 11–51)

## 2015-11-02 LAB — APTT: APTT: 25 s (ref 24–36)

## 2015-11-02 MED ORDER — PANTOPRAZOLE SODIUM 40 MG PO TBEC
40.0000 mg | DELAYED_RELEASE_TABLET | Freq: Every day | ORAL | Status: DC
  Administered 2015-11-02 – 2015-11-09 (×8): 40 mg via ORAL
  Filled 2015-11-02 (×8): qty 1

## 2015-11-02 MED ORDER — METHYLPREDNISOLONE SODIUM SUCC 125 MG IJ SOLR
60.0000 mg | Freq: Four times a day (QID) | INTRAMUSCULAR | Status: DC
  Administered 2015-11-03 – 2015-11-06 (×15): 60 mg via INTRAVENOUS
  Filled 2015-11-02 (×16): qty 2

## 2015-11-02 MED ORDER — METHYLPREDNISOLONE SODIUM SUCC 125 MG IJ SOLR
125.0000 mg | Freq: Once | INTRAMUSCULAR | Status: AC
  Administered 2015-11-02: 125 mg via INTRAVENOUS
  Filled 2015-11-02: qty 2

## 2015-11-02 MED ORDER — SODIUM CHLORIDE 0.9% FLUSH
3.0000 mL | Freq: Two times a day (BID) | INTRAVENOUS | Status: DC
  Administered 2015-11-03 – 2015-11-06 (×8): 3 mL via INTRAVENOUS

## 2015-11-02 MED ORDER — IPRATROPIUM-ALBUTEROL 0.5-2.5 (3) MG/3ML IN SOLN
3.0000 mL | Freq: Once | RESPIRATORY_TRACT | Status: AC
  Administered 2015-11-02: 3 mL via RESPIRATORY_TRACT
  Filled 2015-11-02: qty 3

## 2015-11-02 MED ORDER — IOHEXOL 300 MG/ML  SOLN
75.0000 mL | Freq: Once | INTRAMUSCULAR | Status: AC | PRN
  Administered 2015-11-02: 75 mL via INTRAVENOUS

## 2015-11-02 MED ORDER — SODIUM CHLORIDE 0.9 % IV BOLUS (SEPSIS)
1000.0000 mL | INTRAVENOUS | Status: DC
  Administered 2015-11-02: 1000 mL via INTRAVENOUS

## 2015-11-02 MED ORDER — ENOXAPARIN SODIUM 40 MG/0.4ML ~~LOC~~ SOLN
40.0000 mg | SUBCUTANEOUS | Status: DC
  Administered 2015-11-02 – 2015-11-04 (×3): 40 mg via SUBCUTANEOUS
  Filled 2015-11-02 (×3): qty 0.4

## 2015-11-02 MED ORDER — ONDANSETRON HCL 4 MG PO TABS: 4.0000 mg | ORAL_TABLET | Freq: Four times a day (QID) | ORAL | Status: DC | PRN

## 2015-11-02 MED ORDER — INSULIN ASPART 100 UNIT/ML ~~LOC~~ SOLN
0.0000 [IU] | Freq: Three times a day (TID) | SUBCUTANEOUS | Status: DC
  Administered 2015-11-03: 5 [IU] via SUBCUTANEOUS
  Administered 2015-11-03: 3 [IU] via SUBCUTANEOUS
  Administered 2015-11-03 – 2015-11-04 (×2): 2 [IU] via SUBCUTANEOUS
  Administered 2015-11-04 (×2): 3 [IU] via SUBCUTANEOUS
  Administered 2015-11-05: 5 [IU] via SUBCUTANEOUS
  Administered 2015-11-05: 3 [IU] via SUBCUTANEOUS
  Administered 2015-11-05: 7 [IU] via SUBCUTANEOUS
  Administered 2015-11-06: 5 [IU] via SUBCUTANEOUS
  Administered 2015-11-06: 9 [IU] via SUBCUTANEOUS
  Filled 2015-11-02: qty 3
  Filled 2015-11-02: qty 2
  Filled 2015-11-02: qty 3
  Filled 2015-11-02: qty 5
  Filled 2015-11-02: qty 9
  Filled 2015-11-02: qty 3
  Filled 2015-11-02: qty 7
  Filled 2015-11-02: qty 2
  Filled 2015-11-02: qty 3
  Filled 2015-11-02 (×2): qty 5

## 2015-11-02 MED ORDER — SODIUM CHLORIDE 0.9 % IV SOLN
INTRAVENOUS | Status: DC
  Administered 2015-11-02: 23:00:00 via INTRAVENOUS

## 2015-11-02 MED ORDER — PIPERACILLIN-TAZOBACTAM 3.375 G IVPB 30 MIN
3.3750 g | Freq: Once | INTRAVENOUS | Status: AC
  Administered 2015-11-02: 3.375 g via INTRAVENOUS
  Filled 2015-11-02: qty 50

## 2015-11-02 MED ORDER — SODIUM CHLORIDE 0.9% FLUSH
3.0000 mL | Freq: Two times a day (BID) | INTRAVENOUS | Status: DC
  Administered 2015-11-03 – 2015-11-05 (×3): 3 mL via INTRAVENOUS

## 2015-11-02 MED ORDER — SODIUM CHLORIDE 0.9% FLUSH
3.0000 mL | INTRAVENOUS | Status: DC | PRN
  Administered 2015-11-06: 3 mL via INTRAVENOUS
  Filled 2015-11-02: qty 3

## 2015-11-02 MED ORDER — ONDANSETRON HCL 4 MG/2ML IJ SOLN
4.0000 mg | Freq: Four times a day (QID) | INTRAMUSCULAR | Status: DC | PRN
  Administered 2015-11-05: 4 mg via INTRAVENOUS
  Filled 2015-11-02: qty 2

## 2015-11-02 MED ORDER — ACETAMINOPHEN 325 MG PO TABS: 650.0000 mg | ORAL_TABLET | Freq: Four times a day (QID) | ORAL | Status: DC | PRN

## 2015-11-02 MED ORDER — VANCOMYCIN HCL 10 G IV SOLR
1250.0000 mg | INTRAVENOUS | Status: DC
  Filled 2015-11-02: qty 1250

## 2015-11-02 MED ORDER — ACETAMINOPHEN 650 MG RE SUPP: 650.0000 mg | Freq: Four times a day (QID) | RECTAL | Status: DC | PRN

## 2015-11-02 MED ORDER — PIPERACILLIN-TAZOBACTAM 3.375 G IVPB
3.3750 g | Freq: Three times a day (TID) | INTRAVENOUS | Status: DC
  Administered 2015-11-02 – 2015-11-03 (×3): 3.375 g via INTRAVENOUS
  Filled 2015-11-02 (×4): qty 50

## 2015-11-02 MED ORDER — VANCOMYCIN HCL IN DEXTROSE 1-5 GM/200ML-% IV SOLN
1000.0000 mg | Freq: Once | INTRAVENOUS | Status: AC
  Administered 2015-11-02: 1000 mg via INTRAVENOUS
  Filled 2015-11-02: qty 200

## 2015-11-02 MED ORDER — IPRATROPIUM-ALBUTEROL 0.5-2.5 (3) MG/3ML IN SOLN
3.0000 mL | Freq: Four times a day (QID) | RESPIRATORY_TRACT | Status: DC
  Administered 2015-11-03 – 2015-11-05 (×9): 3 mL via RESPIRATORY_TRACT
  Filled 2015-11-02 (×11): qty 3

## 2015-11-02 MED ORDER — SODIUM CHLORIDE 0.9 % IV SOLN: 250.0000 mL | INTRAVENOUS | Status: DC | PRN

## 2015-11-02 NOTE — Progress Notes (Signed)
Pharmacy Antibiotic Note  Kaitlin Henderson is a 75 y.o. female admitted on 11/02/2015 with sepsis.  Pharmacy has been consulted for vancomycin dosing for empiric coverage. Also on Zosyn.  Plan: Vancomycin 1250 mg IV every 24 hours.  Goal trough 15-20 mcg/mL. Start ~10 after initial dose for stacked dosing.  Vanc trough before 4th dose  Will need to continue to follow renal function and culture results  Height: 5\' 2"  (157.5 cm) Weight: 215 lb (97.523 kg) IBW/kg (Calculated) : 50.1  Temp (24hrs), Avg:98.5 F (36.9 C), Min:98.5 F (36.9 C), Max:98.5 F (36.9 C)   Recent Labs Lab 11/02/15 1602 11/02/15 1752  WBC 7.8  --   CREATININE 0.97  --   LATICACIDVEN  --  1.0    Estimated Creatinine Clearance: 54.7 mL/min (by C-G formula based on Cr of 0.97).    Allergies  Allergen Reactions  . Codeine     REACTION: makes pt pass out  . Levaquin [Levofloxacin In D5w] Other (See Comments)    redness  . Sulfonamide Derivatives     REACTION: edema    Antimicrobials this admission: Vancomycin 3/2 >> Zosyn 3/2>>  Dose adjustments this admission:  Microbiology results: 3/2  BCx: sent 3/2 UCx: sent   Thank you for allowing pharmacy to be a part of this patient's care.  Rocky Morel 11/02/2015 7:36 PM

## 2015-11-02 NOTE — ED Notes (Signed)
Pt arrives from [facility] via [facility] reports that pts sats were 82% on 3.L upon there arrival to her home  EMS placed a NRB nad pt's sats went to 94%   Pt plaaced on  3L upon arrival here  sats 88%  - o2 increased to 4L and sats are now 95%  Pt verbalizes a history of COPD, CHF  "I cannot take care of myself anymore - I hope ya'll keep me."

## 2015-11-02 NOTE — ED Notes (Signed)
[place] ujp to 5L 02 because 02 dropped to 85% 02 came up to 91% after changing to 5L.

## 2015-11-02 NOTE — H&P (Signed)
[facility] [place] NAME: Kaitlin Henderson    MR#:  [id number]  DATE OF BIRTH:  [birth year]  DATE OF ADMISSION:  11/02/2015  PRIMARY CARE PHYSICIAN: Gwendolyn Grant, MD   REQUESTING/REFERRING PHYSICIAN:   CHIEF COMPLAINT:   Chief Complaint  Patient presents with  . Shortness of Breath    HISTORY OF PRESENT ILLNESS: Kaitlin Henderson  is a 75 y.o. female with a known history of COPD, breast cancer, coronary artery disease, depression, diabetes type 2, hyperlipidemia [place] who apparently has been having shortness of breath for the past 1 week. And has been laying in her bed. According to the ED M.D.  she had been laying in her own urine and feces. Patient reports that she's been having shortness of breath and dry cough. Has not had any fevers or chills no abdominal pain no vomiting or diarrhea. In the ER she was noted to have low sats in the 80s. Evaluation including a CBC was negative and a chest x-ray was nonrevealing. Except COPD changes.   PAST MEDICAL HISTORY:    Past Medical History  Diagnosis Date  . Breast cancer, right breast 1998    s/p chemo & XRT, right mastectomy  . OBESITY   . CORONARY ARTERY DISEASE   . APHTHOUS STOMATITIS   . DEPRESSION     started sertraline 09/2010  . DIABETES, TYPE 2 dx 03/2010  . HYPERLIPIDEMIA   . Hypothyroidism   . C O P D     chronic O2 3LPM [place]  . Essential tremor   . NSTEMI (non-ST elevated myocardial infarction) 01/2012    med mgmt    PAST SURGICAL HISTORY: Past Surgical History  Procedure Laterality Date  . Appendectomy    . Cholecystectomy    . Abdominal hysterectomy    . Right mastectomy    . Tonsillectomy      SOCIAL HISTORY:  Social History  Substance Use Topics  . Smoking status: Former Smoker -- 2.50 packs/day for 54 years    Types: Cigarettes    Quit date: 09/02/2006  . Smokeless tobacco: Never Used     Comment: Retired. Widow/widower since  2008-lives alone. Home hospice since 06/2009 related to COPD  . Alcohol Use: No    FAMILY HISTORY:  Family History  Problem Relation Age of Onset  . Diabetes Neg Hx   . Cancer Neg Hx     DRUG ALLERGIES:  Allergies  Allergen Reactions  . Codeine     REACTION: makes pt pass out  . Levaquin [Levofloxacin In D5w] Other (See Comments)    redness  . Sulfonamide Derivatives     REACTION: edema    REVIEW OF SYSTEMS:   CONSTITUTIONAL: No fever, positive fatigue and weakness.  EYES: No blurred or double vision.  EARS, NOSE, AND THROAT: No tinnitus or ear pain.  RESPIRATORY: Positive cough, positive shortness of breath, positive wheezing or hemoptysis.  CARDIOVASCULAR: No chest pain, orthopnea, edema.  GASTROINTESTINAL: No nausea, vomiting, diarrhea or abdominal pain.  GENITOURINARY: No dysuria, hematuria.  ENDOCRINE: No polyuria, nocturia,  HEMATOLOGY: No anemia, easy bruising or bleeding SKIN: No rash or lesion. MUSCULOSKELETAL: No joint pain or arthritis.   NEUROLOGIC: No tingling, numbness, weakness.  PSYCHIATRY: No anxiety or depression.   MEDICATIONS AT HOME:  Patient's medication list is not available. She is unable to tell me what medications she is on. These will need to be obtained pharmacy tech is in  process of trying to obtain these these may not be available total tomorrow.    Prior to Admission medications       PHYSICAL EXAMINATION:   VITAL SIGNS: Pulse 107, temperature 98.5 F (36.9 C), temperature source Oral, height 5\' 2"  (1.575 m), weight 97.523 kg (215 lb), SpO2 90 %.  GENERAL:  75 y.o.-year-old patient lying in the bed chronically ill-appearing  EYES: Pupils equal, round, reactive to light and accommodation. No scleral icterus. Extraocular muscles intact.  HEENT: Head atraumatic, normocephalic. Oropharynx and nasopharynx clear.  NECK:  Supple, no jugular venous distention. No thyroid enlargement, no tenderness.  LUNGS: Diminished breath sounds  bilaterally without any rales rhonchi wheezing CARDIOVASCULAR: S1, S2 normal. No murmurs, rubs, or gallops.  ABDOMEN: Soft, nontender, nondistended. Bowel sounds present. No organomegaly or mass.  EXTREMITIES: No pedal edema, cyanosis, or clubbing. Left leg which is chronically red according to the patient spent going on for a long time. Nothing new NEUROLOGIC: Cranial nerves II through XII are intact. Muscle strength 5/5 in all extremities. Sensation intact. Gait not checked.  PSYCHIATRIC: The patient is alert and oriented x 3.  SKIN: No obvious rash, lesion, or ulcer.   LABORATORY PANEL:   CBC  Recent Labs Lab 11/02/15 1602  WBC 7.8  HGB 15.8  HCT 46.9  PLT 281  MCV 95.8  MCH 32.2  MCHC 33.6  RDW 13.4  LYMPHSABS 1.9  MONOABS 0.6  EOSABS 0.1  BASOSABS 0.0   ------------------------------------------------------------------------------------------------------------------  Chemistries   Recent Labs Lab 11/02/15 1602  NA 142  K 4.0  CL 99*  CO2 34*  GLUCOSE 138*  BUN 15  CREATININE 0.97  CALCIUM 8.9  MG 2.1  AST 21  ALT 13*  ALKPHOS 69  BILITOT 1.0   ------------------------------------------------------------------------------------------------------------------ estimated creatinine clearance is 54.7 mL/min (by C-G formula based on Cr of 0.97). ------------------------------------------------------------------------------------------------------------------ No results for input(s): TSH, T4TOTAL, T3FREE, THYROIDAB in the last 72 hours.  Invalid input(s): FREET3   Coagulation profile No results for input(s): INR, PROTIME in the last 168 hours. ------------------------------------------------------------------------------------------------------------------- No results for input(s): DDIMER in the last 72 hours. -------------------------------------------------------------------------------------------------------------------  Cardiac Enzymes  Recent  Labs Lab 11/02/15 1602  TROPONINI 0.03   ------------------------------------------------------------------------------------------------------------------ Invalid input(s): POCBNP  ---------------------------------------------------------------------------------------------------------------  Urinalysis    Component Value Date/Time   COLORURINE LT. YELLOW 03/13/2010 1435   APPEARANCEUR CLEAR 03/13/2010 1435   LABSPEC <=1.005 03/13/2010 1435   PHURINE 5.5 03/13/2010 1435   GLUCOSEU >=1000 03/13/2010 1435   GLUCOSEU NEGATIVE 08/09/2007 1417   HGBUR MODERATE* 08/09/2007 1417   BILIRUBINUR NEGATIVE 03/13/2010 1435   KETONESUR TRACE 03/13/2010 1435   PROTEINUR NEGATIVE 08/09/2007 1417   UROBILINOGEN 0.2 03/13/2010 1435   NITRITE NEGATIVE 03/13/2010 1435   LEUKOCYTESUR NEGATIVE 03/13/2010 1435     RADIOLOGY: Dg Chest Portable 1 View  11/02/2015  CLINICAL DATA:  Hypoxemia and shortness of breath. EXAM: PORTABLE CHEST 1 VIEW COMPARISON:  09/13/2013 chest radiograph. FINDINGS: Stable chronic mild right mediastinal shift. Stable cardiomediastinal silhouette with top-normal heart size. No pneumothorax. No pleural effusion. No pulmonary edema. Mild reticular opacities at the right lung base, not appreciably changed, favor scarring or atelectasis. No acute consolidative airspace disease. Surgical clips overlie the right axilla. IMPRESSION: Stable right basilar reticular opacities, favor scarring or atelectasis. No acute consolidative airspace disease. Electronically Signed   By: Ilona Sorrel M.D.   On: 11/02/2015 16:43    EKG: Orders placed or performed during the hospital encounter of 11/02/15  . EKG 12-Lead  .  EKG 12-Lead  . EKG 12-Lead  . EKG 12-Lead  . EKG 12-Lead  . EKG 12-Lead    IMPRESSION AND PLAN: Patient is a 74 year old white female with COPD  1. Acute hypoxic respiratory failure likely related to acute on chronic COPD exasperation-  At this time will treat with  nebulizers steroids. I will obtain a CT scan of the chest to rule out any other pathology. 2. Hyperlipidemia resume her home medications once available  3. GERD I will place her on Protonix for now.  4. Diabetes type 2 place her on sliding scale insulin and resume her oral air. Once list available  5. Morbid obesity recommend weight loss  6. Miscellaneous: We'll do Lovenox for DVT prophylaxis     All the records are reviewed and case discussed with ED provider. Management plans discussed with the patient, family and they are in agreement.  CODE STATUS: Code Status History    Date Active Date Inactive Code Status Order ID Comments User Context   01/17/2012  8:32 PM 01/28/2012  4:49 PM DNR [id number]  Mila Palmer, RN Inpatient       TOTAL TIME TAKING CARE OF THIS PATIENT: 55  minutes.    Dustin Flock M.D on 11/02/2015 at 6:57 PM  Between 7am to 6pm - Pager - [phone]  After 6pm go to [email or web address] - password [email or web address] [facility]  Office  [phone]  CC: Primary care physician; Gwendolyn Grant, MD

## 2015-11-02 NOTE — ED Notes (Signed)
Report given to Kate, RN

## 2015-11-02 NOTE — ED Provider Notes (Signed)
[facility] Emergency Department Provider Note  ____________________________________________  Time seen: Approximately 4:05 PM  I have reviewed the triage vital signs and the nursing notes.   HISTORY  Chief Complaint Shortness of Breath    HPI Kaitlin Henderson is a 75 y.o. female with extensive chronic medical issues including COPD on 3 liters of oxygen at baselinewho presents by EMS for a variety of complaints but the primary one is worsening shortness of breath for 5-7 days.  The patient reports that she started feeling like she was having a COPD flare almost a week ago and that is pending readily getting worse and worse.  Nothing makes it better.  She denies chest pain but states it is getting harder and harder for her to breathe and she is currently breathing through pursed lips.  She lives at an independent living facility and has been unable to get out of bed for days.  They bring her her meals but they do not provide care in terms of helping her out of bed.  As a result, the paramedics report that her bed was covered in urine and feces to the point that it made one of the paramedics ill.  The paramedics report that she was satting 80% on her 3 L when they arrived.  She was placed on a nonrebreather which brought her sats to the 90s.  She reports that she has had occasional fever and chills.  She denies dysuria, nausea, vomiting.  She has had several episodes of diarrhea.  She has not taken any recent antibiotics.  She has not had any abdominal pain.   Past Medical History  Diagnosis Date  . Breast cancer, right breast 1998    s/p chemo & XRT, right mastectomy  . OBESITY   . CORONARY ARTERY DISEASE   . APHTHOUS STOMATITIS   . DEPRESSION     started sertraline 09/2010  . DIABETES, TYPE 2 dx 03/2010  . HYPERLIPIDEMIA   . Hypothyroidism   . 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 Essential tremor   . NSTEMI (non-ST elevated myocardial infarction) 01/2012    med  mgmt    Patient Active Problem List   Diagnosis Date Noted  . Non-compliant behavior 12/05/2014  . Decreased mobility 08/15/2014  . Benign essential tremor 09/05/2011  . Breast cancer, right breast ([place])   . DEPRESSION 09/25/2010  . HAIR LOSS 06/26/2010  . Type II or unspecified type diabetes mellitus with neurological manifestations, not stated as uncontrolled 03/14/2010  . POLYURIA 03/13/2010  . APHTHOUS STOMATITIS 08/23/2009  . CHRONIC RESPIRATORY FAILURE 08/18/2007  . HYPERLIPIDEMIA 08/17/2007  . OBESITY 08/17/2007  . CORONARY ARTERY DISEASE 08/17/2007  . COPD with emphysema, [place]  08/17/2007    Past Surgical History  Procedure Laterality Date  . Appendectomy    . Cholecystectomy    . Abdominal hysterectomy    . Right mastectomy    . Tonsillectomy      Current Outpatient Rx  Name  Route  Sig  Dispense  Refill  . albuterol (PROAIR HFA) 108 (90 BASE) MCG/ACT inhaler   Inhalation   Inhale 2 puffs into the lungs every 6 (six) hours as needed for wheezing or shortness of breath. INHALE 2 PUFFS INTO THE LUNGS EVERY 6 (SIX) HOURS AS NEEDED FOR WHEEZING.   8.5 each   2   . albuterol (PROAIR HFA) 108 (90 BASE) MCG/ACT inhaler   Inhalation   Inhale 2  puffs into the lungs every 6 (six) hours as needed for wheezing or shortness of breath. INHALE 2 PUFFS INTO THE LUNGS EVERY 6 (SIX) HOURS AS NEEDED FOR WHEEZING.   8.5 each   2   . albuterol (PROVENTIL) (2.5 MG/3ML) 0.083% nebulizer solution   Nebulization   Take 3 mLs (2.5 mg total) by nebulization 3 (three) times daily. DX 496   360 mL   6     Dx Code:J43.9   . ALPRAZolam (XANAX) 0.5 MG tablet   Oral   Take 0.5-1 tablets (0.25-0.5 mg total) by mouth 2 (two) times daily as needed.   60 tablet   3   . aspirin 81 MG tablet   Oral   Take 1 tablet (81 mg total) by mouth daily.   30 tablet   6   . atorvastatin (LIPITOR) 40 MG tablet   Oral   Take 1 tablet (40 mg total) by mouth daily.   90 tablet   3   .  benzonatate (TESSALON) 100 MG capsule   Oral   Take 100 mg by mouth every 8 (eight) hours as needed. For cough         . esomeprazole (NEXIUM) 40 MG capsule      TAKE 1 CAPSULE BY MOUTH ONCE A DAY   30 capsule   5   . Fluticasone-Salmeterol (ADVAIR) 250-50 MCG/DOSE AEPB   Inhalation   Inhale 1 puff into the lungs every 12 (twelve) hours.   60 each   5   . furosemide (LASIX) 40 MG tablet   Oral   Take 1 tablet (40 mg total) by mouth 2 (two) times daily.   180 tablet   1   . glucose blood (ONE TOUCH ULTRA TEST) test strip      CHECK BLOOD SUGAR TWO TIMES A DAY DX: 250.00   100 each   3     DX Code 250.01   . Lancets MISC      Use as directed to test blood sugar twice daily. Dx: 250.00   100 each   0     One Touch Ultra   . metFORMIN (GLUCOPHAGE-XR) 500 MG 24 hr tablet      TAKE 1 TABLET BY MOUTH TWICE A DAY   60 tablet   5   . ONE TOUCH ULTRA TEST test strip      CHECK BLOOD SUGAR TWO TIMES A DAY DX: 250.00   50 each   1     DX Code Needed .   . potassium chloride SA (K-DUR,KLOR-CON) 20 MEQ tablet      TAKE 1 TABLET (20 MEQ TOTAL) BY MOUTH 2 (TWO) TIMES DAILY.   180 tablet   4   . tiotropium (SPIRIVA HANDIHALER) 18 MCG inhalation capsule   Inhalation   Place 1 capsule (18 mcg total) into inhaler and inhale daily.   30 capsule   5   . traMADol (ULTRAM) 50 MG tablet   Oral   Take 1 tablet (50 mg total) by mouth every 6 (six) hours as needed.   30 tablet   4     Allergies Codeine; Levaquin; and Sulfonamide derivatives  Family History  Problem Relation Age of Onset  . Diabetes Neg Hx   . Cancer Neg Hx     Social History Social History  Substance Use Topics  . Smoking status: Former Smoker -- 2.50 packs/day for 54 years    Types: Cigarettes    Quit date:  09/02/2006  . Smokeless tobacco: Never Used     Comment: Retired. Widow/widower since 2008-lives alone. Home hospice since 06/2009 related to COPD  . Alcohol Use: No    Review of  Systems Constitutional: Subjective fever/chills.  Generalized weakness, general malaise.  No body aches. Eyes: No visual changes. ENT: No sore throat. Cardiovascular: Denies chest pain. Respiratory: Gradual onset of severe shortness of breath Gastrointestinal: No abdominal pain.  No nausea, no vomiting.  Multiple episodes of loose stools over the last several days.  No constipation. Genitourinary: Negative for dysuria. Musculoskeletal: Negative for back pain. Skin: Negative for rash. Neurological: Negative for headaches, focal weakness or numbness.  10-point ROS otherwise negative.  ____________________________________________   PHYSICAL EXAM:  VITAL SIGNS: ED Triage Vitals  Enc Vitals Group     BP --      Pulse Rate 11/02/15 1558 107     Resp --      Temp 11/02/15 1558 98.5 F (36.9 C)     Temp Source 11/02/15 1558 Oral     SpO2 11/02/15 1558 90 %     Weight 11/02/15 1558 215 lb (97.523 kg)     Height 11/02/15 1558 5\' 2"  (1.575 m)     Head Cir --      Peak Flow --      Pain Score --      Pain Loc --      Pain Edu? --      Excl. in [place]? --     Constitutional: Alert and oriented.  Moderate respiratory distress with pursed lips and increased work of breathing.  Covered in feces and urine.  Too weak to care for herself. Eyes: Conjunctivae are normal. PERRL. EOMI. Head: Atraumatic. Nose: No congestion/rhinnorhea. Mouth/Throat: Mucous membranes are moist.  Oropharynx non-erythematous. Neck: No stridor.  No meningismus Cardiovascular: Tachycardia, regular rhythm. Grossly normal heart sounds.  Good peripheral circulation. Respiratory: Increased work of breathing, pursed lips, speaking in short phrases. Gastrointestinal: Obese.  Soft and nontender. No distention. No abdominal bruits. No CVA tenderness. Musculoskeletal: No lower extremity tenderness nor edema.  No joint effusions. Neurologic:  Normal speech and language. No gross focal neurologic deficits are appreciated.   Skin:  Skin is warm, dry and intact. No rash noted.  Areas of erythema on her backside and below her buttocks that could possibly represent an early pressure ulcers but there is not yet any skin breakdown.  As mentioned above, she was covered in urine and feces that had been present for a likely several days.   ____________________________________________   LABS (all labs ordered are listed, but only abnormal results are displayed)  Labs Reviewed  COMPREHENSIVE METABOLIC PANEL - Abnormal; Notable for the following:    Chloride 99 (*)    CO2 34 (*)    Glucose, Bld 138 (*)    ALT 13 (*)    GFR calc non Af Amer 56 (*)    All other components within normal limits  URINE CULTURE  GASTROINTESTINAL PANEL BY PCR, STOOL (REPLACES STOOL CULTURE)  C DIFFICILE QUICK SCREEN W PCR REFLEX  CULTURE, BLOOD (ROUTINE X 2)  CULTURE, BLOOD (ROUTINE X 2)  CBC WITH DIFFERENTIAL/PLATELET  LIPASE, BLOOD  TROPONIN I  MAGNESIUM  LACTIC ACID, PLASMA  LACTIC ACID, PLASMA  APTT  PROTIME-INR   ____________________________________________  EKG  ED ECG REPORT I, Haeden Hudock, the attending physician, personally viewed and interpreted this ECG.   Date: 11/02/2015  EKG Time: 15:47  Rate: 106  Rhythm: sinus tachycardia  Axis: Left axis deviation  Intervals:IVCD, questionable right bundle branch block  ST&T Change: Slight ST depression in the lateral leads, artifact is present on other leads, does not meet criteria for STEMI but may represent acute ischemia  ____________________________________________  RADIOLOGY   Dg Chest Portable 1 View  11/02/2015  CLINICAL DATA:  Hypoxemia and shortness of breath. EXAM: PORTABLE CHEST 1 VIEW COMPARISON:  09/13/2013 chest radiograph. FINDINGS: Stable chronic mild right mediastinal shift. Stable cardiomediastinal silhouette with top-normal heart size. No pneumothorax. No pleural effusion. No pulmonary edema. Mild reticular opacities at the right lung base, not  appreciably changed, favor scarring or atelectasis. No acute consolidative airspace disease. Surgical clips overlie the right axilla. IMPRESSION: Stable right basilar reticular opacities, favor scarring or atelectasis. No acute consolidative airspace disease. Electronically Signed   By: Ilona Sorrel M.D.   On: 11/02/2015 16:43    ____________________________________________   PROCEDURES  Procedure(s) performed: None  Critical Care performed: Yes, see critical care note(s)   CRITICAL CARE Performed by: Hinda Kehr   Total critical care time: 30 minutes  Critical care time was exclusive of separately billable procedures and treating other patients.  Critical care was necessary to treat or prevent imminent or life-threatening deterioration.  Critical care was time spent personally by me on the following activities: development of treatment plan with patient and/or surrogate as well as nursing, discussions with consultants, evaluation of patient's response to treatment, examination of patient, obtaining history from patient or surrogate, ordering and performing treatments and interventions, ordering and review of laboratory studies, ordering and review of radiographic studies, pulse oximetry and re-evaluation of patient's condition.  ____________________________________________   INITIAL IMPRESSION / ASSESSMENT AND PLAN / ED COURSE  Pertinent labs & imaging results that were available during my care of the patient were reviewed by me and considered in my medical decision making (see chart for details).  The patient meets sepsis criteria upon arrival with her hypoxemia and her tachycardia.  She is in moderate respiratory distress likely with her COPD exacerbation which most likely started most of these symptoms.  I will treat her for her COPD exacerbation but also called code sepsis and are giving her empiric antibiotics and am planning for 30 mL/kg of normal saline.  She does have a  history of heart failure but at this time we will begin IV fluid hydration at least until we prove that she has a normal lactate.  The diarrhea has a particularly foul smelling odor, and to err on the side of caution, I ordered GI pathogen testing including C diff with enteric precautions.  ----------------------------------------- 6:08 PM on 11/02/2015 -----------------------------------------  Patient has not yet had another bowel movement, we will continue precautions for now.  The urine sample that was sent to the lab was too small for them to do a urinalysis.  The patient is receiving empiric antibiotics as described above.  Her breathing has improved after treatments.  I have spoken with Dr. Posey Pronto to admit for acute respiratory failure with hypoxemia.  Although she does meet sepsis criteria based on her while signs, I leave her vital signs are the result of COPD exacerbation and not "true" sepsis.  ____________________________________________  FINAL CLINICAL IMPRESSION(S) / ED DIAGNOSES  Final diagnoses:  Acute respiratory failure with hypoxia (HCC)  COPD exacerbation (HCC)      NEW MEDICATIONS STARTED DURING THIS VISIT:  New Prescriptions   No medications on file      Note:  This document was prepared using Dragon  voice recognition software and may include unintentional dictation errors.   Hinda Kehr, MD 11/02/15 [phone]

## 2015-11-02 NOTE — ED Notes (Signed)
Pt taken to CT at this time.

## 2015-11-02 NOTE — ED Notes (Signed)
Report given to Debra, RN.

## 2015-11-03 LAB — GLUCOSE, CAPILLARY
GLUCOSE-CAPILLARY: 211 mg/dL — AB (ref 65–99)
Glucose-Capillary: 156 mg/dL — ABNORMAL HIGH (ref 65–99)
Glucose-Capillary: 278 mg/dL — ABNORMAL HIGH (ref 65–99)
Glucose-Capillary: 244 mg/dL — ABNORMAL HIGH (ref 65–99)

## 2015-11-03 LAB — BLOOD GAS, ARTERIAL
ACID-BASE EXCESS: 5.8 mmol/L — AB (ref 0.0–3.0)
Allens test (pass/fail): POSITIVE — AB
Bicarbonate: 32.2 mEq/L — ABNORMAL HIGH (ref 21.0–28.0)
FIO2: 0.44
O2 SAT: 87 %
PATIENT TEMPERATURE: 37
PH ART: 7.4 (ref 7.350–7.450)
pCO2 arterial: 52 mmHg — ABNORMAL HIGH (ref 32.0–48.0)
pO2, Arterial: 53 mmHg — ABNORMAL LOW (ref 83.0–108.0)

## 2015-11-03 LAB — CBC
HEMATOCRIT: 47.1 % — AB (ref 35.0–47.0)
HEMOGLOBIN: 15.9 g/dL (ref 12.0–16.0)
MCH: 33.1 pg (ref 26.0–34.0)
MCHC: 33.8 g/dL (ref 32.0–36.0)
MCV: 98 fL (ref 80.0–100.0)
Platelets: 261 10*3/uL (ref 150–440)
RBC: 4.81 MIL/uL (ref 3.80–5.20)
RDW: 13.2 % (ref 11.5–14.5)
WBC: 5.5 10*3/uL (ref 3.6–11.0)

## 2015-11-03 LAB — BASIC METABOLIC PANEL
Anion gap: 9 (ref 5–15)
BUN: 15 mg/dL (ref 6–20)
CHLORIDE: 103 mmol/L (ref 101–111)
CO2: 32 mmol/L (ref 22–32)
CREATININE: 1.04 mg/dL — AB (ref 0.44–1.00)
Calcium: 8.7 mg/dL — ABNORMAL LOW (ref 8.9–10.3)
GFR calc non Af Amer: 51 mL/min — ABNORMAL LOW (ref >60)
GFR, EST AFRICAN AMERICAN: 59 mL/min — AB (ref >60)
GLUCOSE: 187 mg/dL — AB (ref 65–99)
Potassium: 4.6 mmol/L (ref 3.5–5.1)
Sodium: 144 mmol/L (ref 135–145)

## 2015-11-03 LAB — INFLUENZA PANEL BY PCR (TYPE A & B)
H1N1 flu by pcr: NOT DETECTED
INFLAPCR: NEGATIVE
INFLBPCR: NEGATIVE

## 2015-11-03 MED ORDER — BUDESONIDE 0.25 MG/2ML IN SUSP
0.2500 mg | Freq: Two times a day (BID) | RESPIRATORY_TRACT | Status: DC
  Administered 2015-11-03 – 2015-11-06 (×5): 0.25 mg via RESPIRATORY_TRACT
  Filled 2015-11-03 (×5): qty 2

## 2015-11-03 MED ORDER — IBUPROFEN 400 MG PO TABS
400.0000 mg | ORAL_TABLET | Freq: Three times a day (TID) | ORAL | Status: DC | PRN
  Administered 2015-11-03: 400 mg via ORAL
  Filled 2015-11-03: qty 1

## 2015-11-03 MED ORDER — LEVOFLOXACIN IN D5W 750 MG/150ML IV SOLN
750.0000 mg | INTRAVENOUS | Status: DC
  Administered 2015-11-03: 750 mg via INTRAVENOUS
  Filled 2015-11-03: qty 150

## 2015-11-03 MED ORDER — IBUPROFEN 400 MG PO TABS: 400.0000 mg | ORAL_TABLET | Freq: Three times a day (TID) | ORAL | Status: DC | PRN

## 2015-11-03 MED ORDER — ALPRAZOLAM 0.25 MG PO TABS
0.2500 mg | ORAL_TABLET | Freq: Three times a day (TID) | ORAL | Status: DC | PRN
  Administered 2015-11-03 – 2015-11-08 (×6): 0.25 mg via ORAL
  Filled 2015-11-03 (×6): qty 1

## 2015-11-03 NOTE — Clinical Social Work Note (Addendum)
Clinical Social Work Assessment  Patient Details  Name: Kaitlin Henderson MRN: [id number] Date of Birth: 1941-01-18  Date of referral:  11/03/15               Reason for consult:  Facility Placement, Other (Comment Required) (Patient is refusing SNF (from [place]) )                Permission sought to share information with:  Facility Art therapist granted to share information::  No  Name::        Agency::     Relationship::     Contact Information:     Housing/Transportation Living arrangements for the past 2 months:  [place] of Information:  Patient Patient Interpreter Needed:  None  Criminal Activity/Legal Involvement Pertinent to Current Situation/Hospitalization:  No - Comment as needed Significant Relationships:  Significant Other Lives with:  Self Do you feel safe going back to the place where you live?  Yes Need for family participation in patient care:  No (Coment)  Care giving concerns: Patient is an independent living resident at [facility] in [place].    Social Worker assessment / plan: Holiday representative (CSW) met with patient to complete assessment. Patient was alert and oriented and laying in the bed. CSW introduced self and explained role of CSW department. Patient reported that she has lived at [facility] for 3 years now. Per patient she primarily uses an electric wheel chair and goes down to the dining hall for lunch and dinner. Per patient she lives in an apartment on the first level. Per patient she is on 3 liters chronic oxygen at home through [place]. Patient reported that she has 1 son and 1 daughter that she does not speak to. Patient asked CSW to not call her children under any circumstances. CSW provided emotional support. Patient reported that she does have a boyfriend Kaitlin Henderson [phone] that lives in [place]. Patient did give CSW permission to  call her boyfriend if needed. Patient was tearful during assessment and reported that an MD just told her she might have lung cancer. CSW provided emotional support. CSW discussed SNF placement. Patient adamantly refused SNF placement and reported that she wants to die at [facility]. Patient asked about hospice services. CSW explained that patient will have to meet criteria for hospice services. Patient reported that she would not want to go to a hospice facility but she would like for hospice services to come to [facility]. Patient reported that she has had hospice services before in [facility]. CSW provided hospice choice and patient chose [place]. Patient did give CSW permission to contact [facility] liaison.   CSW attempted to discuss SNF placement with patient for a second time. Patient continued to adamantly refuse SNF placement and reported that she wants to die at [facility]. CSW contacted [facility] and made her aware of above. CSW will continue to follow and assist as needed.    Employment status:  Retired Forensic scientist:  Medicare PT Recommendations:  Not assessed at this time [place] / Referral to community resources:  Other (Comment Required) (Patient is refusing SNF and Home Health )  Patient/Family's Response to care:  Patient adamantly refused SNF. Patient requested hospice services.   Patient/Family's Understanding of and Emotional Response to Diagnosis, Current Treatment, and Prognosis:  Patient was tearful throughout assessment. Patient would like to speak to MD  about her prognosis. Patient thanked CSW for visit.   Emotional Assessment Appearance:  Appears stated age Attitude/Demeanor/Rapport:  Crying Affect (typically observed):  Tearful/Crying, Appropriate, Calm Orientation:  Oriented to Place, Oriented to Self, Oriented to  Time, Oriented to Situation Alcohol / Substance use:  Not Applicable Psych involvement (Current and  /or in the community):  No (Comment)  Discharge Needs  Concerns to be addressed:  Discharge Planning Concerns Readmission within the last 30 days:  No Current discharge risk:  Chronically ill, Dependent with Mobility Barriers to Discharge:  Continued Medical Work up   Loralyn Freshwater, LCSW 11/03/2015, 2:51 PM

## 2015-11-03 NOTE — Progress Notes (Signed)
Pt's O2 87% on 4L, MD [place] notified. Per MD get STAT ABG's, and place pt on hi flow.  Respiratory notified. Will continue to monitor.

## 2015-11-03 NOTE — Consult Note (Signed)
Pharmacy Antibiotic Note  Kaitlin Henderson is a 75 y.o. female admitted on 11/02/2015 with COPD exacerbation.  Pharmacy has been consulted for levofloxacin dosing.  Plan: levofloxacin 750mg  q 48 hours  Will monitor renal function closely as pt is borderline for dose change  Height: 5\' 2"  (157.5 cm) Weight: 202 lb 12.8 oz (91.989 kg) IBW/kg (Calculated) : 50.1  Temp (24hrs), Avg:98.3 F (36.8 C), Min:98 F (36.7 C), Max:98.5 F (36.9 C)   Recent Labs Lab 11/02/15 1602 11/02/15 1752 11/02/15 2132 11/03/15 0637  WBC 7.8  --   --  5.5  CREATININE 0.97  --   --  1.04*  LATICACIDVEN  --  1.0 1.6  --     Estimated Creatinine Clearance: 49.4 mL/min (by C-G formula based on Cr of 1.04).    Allergies  Allergen Reactions  . Codeine     REACTION: makes pt pass out  . Levaquin [Levofloxacin In D5w] Other (See Comments)    redness  . Sulfonamide Derivatives     REACTION: edema    Antimicrobials this admission: Vancomycin 3/2 >> 3/3 zosyn 3/2 >> 3/3 Levofloxacin 3/3>>   Dose adjustments this admission:   Microbiology results: 3/2 BCx: pending 3/2 UCx: insignificant growth    Thank you for allowing pharmacy to be a part of this patient's care.  Ramond Dial 11/03/2015 2:51 PM

## 2015-11-03 NOTE — Consult Note (Signed)
CT chest reviewed, patient with pleural based spiculated mass in RT upper lobe apex Recommend CT Guided Biopsy

## 2015-11-03 NOTE — Progress Notes (Signed)
PT Cancellation Note  Patient Details Name: Kaitlin Henderson MRN: [id number] DOB: 08-Nov-1940   Cancelled Treatment:    Reason Eval/Treat Not Completed: Fatigue/lethargy limiting ability to participate (Consult received and chart reviewed.  Patient sleeping upon arrival, but arousable to voice.  Declined participation with PT evaluation (OOB to chair, or bed-level activity) due to "just not feeling up to it today".  Will re-attempt next date as patient agreeable and medically appropriate.)   Bonnie Overdorf H. Owens Shark, PT, DPT, NCS 11/03/2015, 4:19 PM [phone]

## 2015-11-03 NOTE — Progress Notes (Signed)
Called Dr Verdell Carmine with patient ABG.  Patient saturations are now 90% on 6lpm [place] and patient states she is breathing better and was eating her lunch.  Patient also stated that at home she takes Xanex for her breathing and is not getting anything here to help with her anxiety which she has been very anxious this morning.  Dr Verdell Carmine stated that he will order something for her anxiety and see if she continues to improve with her breathing if not we will start HFNC.

## 2015-11-03 NOTE — Progress Notes (Signed)
[name] NAME: Kaitlin Henderson    MR#:  [id number]  DATE OF BIRTH:  1941-08-02  SUBJECTIVE:   Patient is here due to shortness of breath, cough and noted to be in COPD exacerbation. CT chest shows a right upper lobe spiculated lung mass. No hemoptysis. Patient tearful this morning. Anxious.  REVIEW OF SYSTEMS:    Review of Systems  Constitutional: Negative for fever and chills.  HENT: Negative for congestion and tinnitus.   Eyes: Negative for blurred vision and double vision.  Respiratory: Positive for shortness of breath. Negative for cough and wheezing.   Cardiovascular: Negative for chest pain, orthopnea and PND.  Gastrointestinal: Negative for nausea, vomiting, abdominal pain and diarrhea.  Genitourinary: Negative for dysuria and hematuria.  Neurological: Negative for dizziness, sensory change and focal weakness.  Psychiatric/Behavioral: The patient is nervous/anxious.   All other systems reviewed and are negative.   Nutrition: Heart healthy/Carb modified. Tolerating Diet: yes Tolerating PT: Await Eval.    DRUG ALLERGIES:   Allergies  Allergen Reactions  . Codeine     REACTION: makes pt pass out  . Levaquin [Levofloxacin In D5w] Other (See Comments)    redness  . Sulfonamide Derivatives     REACTION: edema    VITALS:  Blood pressure 102/66, pulse 92, temperature 98.5 F (36.9 C), temperature source Oral, resp. rate 22, height 5\' 2"  (1.575 m), weight 91.989 kg (202 lb 12.8 oz), SpO2 92 %.  PHYSICAL EXAMINATION:   Physical Exam  GENERAL:  75 y.o.-year-old patient lying in the bed anxious, tearful.   EYES: Pupils equal, round, reactive to light and accommodation. No scleral icterus. Extraocular muscles intact.  HEENT: Head atraumatic, normocephalic. Oropharynx and nasopharynx clear.  NECK:  Supple, no jugular venous distention. No thyroid enlargement, no tenderness.  LUNGS: Prolonged insp & exp.  phase, some wheezing noted b/l, rales, rhonchi. No use of accessory muscles of respiration.  CARDIOVASCULAR: S1, S2 normal. No murmurs, rubs, or gallops.  ABDOMEN: Soft, nontender, nondistended. Bowel sounds present. No organomegaly or mass.  EXTREMITIES: No cyanosis, clubbing or edema b/l.    NEUROLOGIC: Cranial nerves II through XII are intact. No focal Motor or sensory deficits b/l.  Globally weak.  PSYCHIATRIC: The patient is alert and oriented x 3. Anxious, Tearful.  SKIN: No obvious rash, lesion, or ulcer.    LABORATORY PANEL:   CBC  Recent Labs Lab 11/03/15 0637  WBC 5.5  HGB 15.9  HCT 47.1*  PLT 261   ------------------------------------------------------------------------------------------------------------------  Chemistries   Recent Labs Lab 11/02/15 1602 11/03/15 0637  NA 142 144  K 4.0 4.6  CL 99* 103  CO2 34* 32  GLUCOSE 138* 187*  BUN 15 15  CREATININE 0.97 1.04*  CALCIUM 8.9 8.7*  MG 2.1  --   AST 21  --   ALT 13*  --   ALKPHOS 69  --   BILITOT 1.0  --    ------------------------------------------------------------------------------------------------------------------  Cardiac Enzymes  Recent Labs Lab 11/02/15 1602  TROPONINI 0.03   ------------------------------------------------------------------------------------------------------------------  RADIOLOGY:  Ct Chest W Contrast  11/02/2015  CLINICAL DATA:  Shortness of Breath and cough EXAM: CT CHEST WITH CONTRAST TECHNIQUE: Multidetector CT imaging of the chest was performed during intravenous contrast administration. CONTRAST:  38mL OMNIPAQUE IOHEXOL 300 MG/ML  SOLN COMPARISON:  Plain film from earlier in the same day FINDINGS: Lungs are well aerated bilaterally and demonstrate bilateral emphysematous changes. A minimal amount of atelectasis is noted  in the right middle lobe. No sizable effusion is seen. In the right upper lobe however there is a 2.1 cm mildly spiculated pleural-based lesion  identified. The thoracic inlet is within normal limits. Aortic calcifications are noted without aneurysmal dilatation. No hilar or mediastinal adenopathy is seen. The visualized upper abdomen is within normal limits with the exception of evidence of prior cholecystectomy. Postsurgical changes are noted in the right axilla and changes of prior mastectomy are seen. IMPRESSION: Emphysematous changes. 2.1 cm mildly spiculated pleural-based lesion in the right apex laterally. This is suspicious for underlying carcinoma and further evaluation is recommended. Electronically Signed   By: Inez Catalina M.D.   On: 11/02/2015 19:56   Dg Chest Portable 1 View  11/02/2015  CLINICAL DATA:  Hypoxemia and shortness of breath. EXAM: PORTABLE CHEST 1 VIEW COMPARISON:  09/13/2013 chest radiograph. FINDINGS: Stable chronic mild right mediastinal shift. Stable cardiomediastinal silhouette with top-normal heart size. No pneumothorax. No pleural effusion. No pulmonary edema. Mild reticular opacities at the right lung base, not appreciably changed, favor scarring or atelectasis. No acute consolidative airspace disease. Surgical clips overlie the right axilla. IMPRESSION: Stable right basilar reticular opacities, favor scarring or atelectasis. No acute consolidative airspace disease. Electronically Signed   By: Ilona Sorrel M.D.   On: 11/02/2015 16:43     ASSESSMENT AND PLAN:   75 year old female with past medical history of COPD, breast cancer, obesity, diabetes type 2, depression, hypothyroidism, GERD who presented to the hospital due to shortness of breath and noted to be in COPD exacerbation.  #1 COPD exacerbation-continue IV steroids, DuoNeb nebs around-the-clock, I will add Pulmicort nebs. -We'll start on IV Levaquin, D/c Vanco, zosyn.  -Follow clinically and wean off O2 as tolerated. Patient is already on oxygen at home.  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 -We'll need outpatient PET scan, oncology follow-up  and possible biopsy. -No hemoptysis.  #3 DM - cont. SSI.   #4 Anxiety - PRN Xanax.   #5 GERD - cont. Protonix.    #6 Diarrhea - pt. Was having diarrhea at home apparently but none here.  - await stool for comp. Culture/C. Diff.  Supportive care.   Await PT Eval and likely will need SNF upon discharge.    All the records are reviewed and case discussed with Care Management/Social Workerr. Management plans discussed with the patient, family and they are in agreement.  CODE STATUS:   DVT Prophylaxis: Lovenox.   TOTAL TIME TAKING CARE OF THIS PATIENT: 30 minutes.   POSSIBLE D/C IN 2-3 DAYS, DEPENDING ON CLINICAL CONDITION.   Henreitta Leber M.D on 11/03/2015 at 3:19 PM  Between 7am to 6pm - Pager - [phone]  After 6pm go to [email or web address] - password [email or web address] [facility]  Office  [phone]  CC: Primary care physician; Gwendolyn Grant, MD

## 2015-11-03 NOTE — Progress Notes (Signed)
Patient complaining of headache. States she cannot tolerate tylenol. MD notified. Orders received to give motrin 400mg  every 8 hours PRN

## 2015-11-03 NOTE — Progress Notes (Signed)
   11/03/15 1100  Clinical Encounter Type  Visited With Patient  Visit Type Initial;Spiritual support  Referral From Nurse  Consult/Referral To Chaplain  Spiritual Encounters  Spiritual Needs Emotional;Prayer  Stress Factors  Patient Stress Factors Health changes;Major life changes;Family relationships  Visited [facility] for pastoral care & prayer. Provided safe place for expressing emotional pains and fears. Will follow up this afternoon. [place]. Cam Da[place]hin G. [place]

## 2015-11-03 NOTE — Progress Notes (Signed)
°   11/03/15 1500  Clinical Encounter Type  Visited With Patient  Visit Type Follow-up  Referral From Chaplain  Consult/Referral To Chaplain  Spiritual Encounters  Spiritual Needs Prayer  Stress Factors  Patient Stress Factors Health changes  Pastoral care visit and prayer. [place]

## 2015-11-04 DIAGNOSIS — J9622 Acute and chronic respiratory failure with hypercapnia: Secondary | ICD-10-CM

## 2015-11-04 DIAGNOSIS — J9621 Acute and chronic respiratory failure with hypoxia: Principal | ICD-10-CM

## 2015-11-04 DIAGNOSIS — R0602 Shortness of breath: Secondary | ICD-10-CM

## 2015-11-04 DIAGNOSIS — J441 Chronic obstructive pulmonary disease with (acute) exacerbation: Secondary | ICD-10-CM

## 2015-11-04 LAB — GLUCOSE, CAPILLARY
GLUCOSE-CAPILLARY: 213 mg/dL — AB (ref 65–99)
GLUCOSE-CAPILLARY: 243 mg/dL — AB (ref 65–99)
Glucose-Capillary: 207 mg/dL — ABNORMAL HIGH (ref 65–99)
Glucose-Capillary: 212 mg/dL — ABNORMAL HIGH (ref 65–99)

## 2015-11-04 LAB — CREATININE, SERUM
Creatinine, Ser: 0.92 mg/dL (ref 0.44–1.00)
GFR, EST NON AFRICAN AMERICAN: 59 mL/min — AB (ref >60)

## 2015-11-04 MED ORDER — MAGNESIUM SULFATE 2 GM/50ML IV SOLN
2.0000 g | Freq: Once | INTRAVENOUS | Status: AC
  Administered 2015-11-04: 2 g via INTRAVENOUS
  Filled 2015-11-04: qty 50

## 2015-11-04 MED ORDER — LEVOFLOXACIN IN D5W 750 MG/150ML IV SOLN
750.0000 mg | INTRAVENOUS | Status: DC
  Administered 2015-11-04: 750 mg via INTRAVENOUS
  Filled 2015-11-04 (×2): qty 150

## 2015-11-04 NOTE — Plan of Care (Signed)
Problem: Health Behavior/Discharge Planning: Goal: Ability to manage health-related needs will improve Outcome: Not Progressing Patient having a hard time processing new diagnosis.  Refuses hygienic care when offered.  Problem: Physical Regulation: Goal: Ability to maintain clinical measurements within normal limits will improve Outcome: Not Progressing Increased from nasal cannula to high flow nasal cannula  Problem: Tissue Perfusion: Goal: Risk factors for ineffective tissue perfusion will decrease Outcome: Not Progressing Patient refuses to use SCD's.  Problem: Activity: Goal: Risk for activity intolerance will decrease Outcome: Not Progressing Oxygen requirements with nasal cannula at 6 liters poorly.

## 2015-11-04 NOTE — Progress Notes (Addendum)
Patient ID: Kaitlin Henderson, female   DOB: 1941/06/15, 75 y.o.   MRN: [id number] [name] PROGRESS NOTE  Kaitlin Henderson [id number] DOB: 1941-08-10 DOA: 11/02/2015 PCP: Kaitlin Grant, MD  HPI/Subjective: Patient states that she is ready to go to heaven. She asked me if she should call the funeral home. Patient states that she has been having trouble breathing going on for a long time. Still with cough and shortness of breath. Patient does want to remain a full code.  Objective: Filed Vitals:   11/04/15 0500 11/04/15 0800  BP: 128/64   Pulse: 106   Temp: 96.5 F (35.8 C)   Resp: 22 36    Filed Weights   11/02/15 1558 11/02/15 2022  Weight: 97.523 kg (215 lb) 91.989 kg (202 lb 12.8 oz)    ROS: Review of Systems  Constitutional: Negative for fever and chills.  Eyes: Negative for blurred vision.  Respiratory: Positive for cough, shortness of breath and wheezing.   Cardiovascular: Negative for chest pain.  Gastrointestinal: Negative for nausea, vomiting, abdominal pain, diarrhea and constipation.  Genitourinary: Negative for dysuria.  Musculoskeletal: Negative for joint pain.  Neurological: Negative for dizziness and headaches.   Exam: Physical Exam  Constitutional: She is oriented to person, place, and time.  HENT:  Nose: No mucosal edema.  Mouth/Throat: No oropharyngeal exudate or posterior oropharyngeal edema.  Eyes: Conjunctivae, EOM and lids are normal. Pupils are equal, round, and reactive to light.  Neck: No JVD present. Carotid bruit is not present. No edema present. No thyroid mass and no thyromegaly present.  Cardiovascular: S1 normal and S2 normal.  Tachycardia present.  Exam reveals no gallop.   No murmur heard. Pulses:      Dorsalis pedis pulses are 2+ on the right side, and 2+ on the left side.  Respiratory: No respiratory distress. She has decreased breath sounds in the right upper field, the right middle field, the right lower field,  the left upper field, the left middle field and the left lower field. She has wheezes in the right lower field and the left lower field. She has no rhonchi. She has no rales.  GI: Soft. Bowel sounds are normal. There is no tenderness.  Musculoskeletal:       Right ankle: She exhibits swelling.       Left ankle: She exhibits swelling.  Lymphadenopathy:    She has no cervical adenopathy.  Neurological: She is alert and oriented to person, place, and time. No cranial nerve deficit.  Skin: Skin is warm. Nails show no clubbing.  Slight erythema left lower extremity  Psychiatric: She has a normal mood and affect.      Data Reviewed: Basic Metabolic Panel:  Recent Labs Lab 11/02/15 1602 11/03/15 0637 11/04/15 0335  NA 142 144  --   K 4.0 4.6  --   CL 99* 103  --   CO2 34* 32  --   GLUCOSE 138* 187*  --   BUN 15 15  --   CREATININE 0.97 1.04* 0.92  CALCIUM 8.9 8.7*  --   MG 2.1  --   --    Liver Function Tests:  Recent Labs Lab 11/02/15 1602  AST 21  ALT 13*  ALKPHOS 69  BILITOT 1.0  PROT 7.4  ALBUMIN 3.6    Recent Labs Lab 11/02/15 1602  LIPASE 16   No results for input(s): AMMONIA in the last 168 hours. CBC:  Recent Labs Lab 11/02/15 1602 11/03/15 [id number]  WBC 7.8 5.5  NEUTROABS 5.0  --   HGB 15.8 15.9  HCT 46.9 47.1*  MCV 95.8 98.0  PLT 281 261   CBG:  Recent Labs Lab 11/03/15 0818 11/03/15 1121 11/03/15 1633 11/03/15 2130 11/04/15 0755  GLUCAP 156* 278* 211* 244* 213*    Recent Results (from the past 240 hour(s))  Urine culture     Status: None (Preliminary result)   Collection Time: 11/02/15  4:01 PM  Result Value Ref Range Status   Specimen Description URINE, CLEAN CATCH  Final   Special Requests Normal  Final   Culture HOLDING FOR POSSIBLE PATHOGEN  Final   Report Status PENDING  Incomplete  Blood Culture (routine x 2)     Status: None (Preliminary result)   Collection Time: 11/02/15  5:53 PM  Result Value Ref Range Status   Specimen  Description BLOOD LEFT ASSIST CONTROL  Final   Special Requests   Final    BOTTLES DRAWN AEROBIC AND ANAEROBIC  4CC AERO, 5CC ANA   Culture NO GROWTH 2 DAYS  Final   Report Status PENDING  Incomplete  Blood Culture (routine x 2)     Status: None (Preliminary result)   Collection Time: 11/02/15  5:55 PM  Result Value Ref Range Status   Specimen Description BLOOD LEFT HAND  Final   Special Requests   Final    BOTTLES DRAWN AEROBIC AND ANAEROBIC  6CC AERO, 3CC ANA   Culture NO GROWTH 2 DAYS  Final   Report Status PENDING  Incomplete     Studies: Ct Chest W Contrast  11/02/2015  CLINICAL DATA:  Shortness of Breath and cough EXAM: CT CHEST WITH CONTRAST TECHNIQUE: Multidetector CT imaging of the chest was performed during intravenous contrast administration. CONTRAST:  36mL OMNIPAQUE IOHEXOL 300 MG/ML  SOLN COMPARISON:  Plain film from earlier in the same day FINDINGS: Lungs are well aerated bilaterally and demonstrate bilateral emphysematous changes. A minimal amount of atelectasis is noted in the right middle lobe. No sizable effusion is seen. In the right upper lobe however there is a 2.1 cm mildly spiculated pleural-based lesion identified. The thoracic inlet is within normal limits. Aortic calcifications are noted without aneurysmal dilatation. No hilar or mediastinal adenopathy is seen. The visualized upper abdomen is within normal limits with the exception of evidence of prior cholecystectomy. Postsurgical changes are noted in the right axilla and changes of prior mastectomy are seen. IMPRESSION: Emphysematous changes. 2.1 cm mildly spiculated pleural-based lesion in the right apex laterally. This is suspicious for underlying carcinoma and further evaluation is recommended. Electronically Signed   By: Inez Catalina M.D.   On: 11/02/2015 19:56   Dg Chest Portable 1 View  11/02/2015  CLINICAL DATA:  Hypoxemia and shortness of breath. EXAM: PORTABLE CHEST 1 VIEW COMPARISON:  09/13/2013 chest  radiograph. FINDINGS: Stable chronic mild right mediastinal shift. Stable cardiomediastinal silhouette with top-normal heart size. No pneumothorax. No pleural effusion. No pulmonary edema. Mild reticular opacities at the right lung base, not appreciably changed, favor scarring or atelectasis. No acute consolidative airspace disease. Surgical clips overlie the right axilla. IMPRESSION: Stable right basilar reticular opacities, favor scarring or atelectasis. No acute consolidative airspace disease. Electronically Signed   By: Ilona Sorrel M.D.   On: 11/02/2015 16:43    Scheduled Meds: . budesonide (PULMICORT) nebulizer solution  0.25 mg Nebulization BID  . enoxaparin (LOVENOX) injection  40 mg Subcutaneous Q24H  . insulin aspart  0-9 Units Subcutaneous TID WC  . ipratropium-albuterol  3 mL Nebulization Q6H  . levofloxacin (LEVAQUIN) IV  750 mg Intravenous Q24H  . methylPREDNISolone (SOLU-MEDROL) injection  60 mg Intravenous Q6H  . pantoprazole  40 mg Oral Daily  . sodium chloride flush  3 mL Intravenous Q12H  . sodium chloride flush  3 mL Intravenous Q12H    Assessment/Plan:  1. Acute on chronic respiratory failure. Patient is having trouble moving air. Patient currently on 6 L of oxygen. May need BiPAP or high flow nasal cannula. Nursing staff called respiratory to evaluate. There is a pulmonary consult in the computer and I spoke with critical-specialist. 2. COPD exacerbation continue high-dose steroids and nebulizer treatments continue budesonide. Patient is on Levaquin. 3. 2.1 cm spiculated lung nodule. Will need workup as outpatient including biopsy 4. Type 2 diabetes- on sliding scale 5. Anxiety on Xanax 6. Gastroesophageal reflux disease without esophagitis on Protonix 7. Previous diarrhea- no further diarrhea at this point 8. Weakness- potentially need nursing home rehabilitation upon discharge. 9. Left lower extremity cellulitis continue IV Levaquin  Code Status:     Code Status  Orders        Start     Ordered   11/02/15 2024  Full code   Continuous     11/02/15 2023    Code Status History    Date Active Date Inactive Code Status Order ID Comments User Context   01/17/2012  8:32 PM 01/28/2012  4:49 PM DNR [id number]  Mila Palmer, RN Inpatient     Family Communication: Patient did not want me to contact her daughter Disposition Plan: To be determined  Antibiotics:  Levaquin  Time spent: 25 minutes  Loletha Grayer  [facility]

## 2015-11-04 NOTE — Progress Notes (Signed)
Physical Therapy Evaluation Patient Details Name: Kaitlin Henderson MRN: [id number] DOB: 06-10-1941 Today's Date: 11/04/2015   History of Present Illness  Kaitlin Henderson is a 75 y.o. female with a known history of COPD, breast cancer, coronary artery disease, depression, diabetes type 2, hyperlipidemia  Clinical Impression  Pt very short of breath this session and was able to tolerate brief standing before needing to lie back down.  O2 sats monitored throughout and dropped to 76-78% with activity, recovered to 85-86% with supine rest for 3-4 min, then up to 91% after 5 min with deliberate pursed lip breathing technique/coaching.  Pt has very poor pulmonary status at this time limiting functional mobility.  Pt would benefit from acute PT services to address objective findings.    Follow Up Recommendations SNF    Equipment Recommendations  Hospital bed    Recommendations for Other Services       Precautions / Restrictions Precautions Precautions: Fall Precaution Comments: isolation, enteric Restrictions Weight Bearing Restrictions: No      Mobility  Bed Mobility Overal bed mobility: Needs Assistance Bed Mobility: Supine to Sit;Sit to Supine     Supine to sit: Min guard Sit to supine: HOB elevated   General bed mobility comments: Mod A to scoot up in bed  Transfers Overall transfer level: Modified independent Equipment used: None             General transfer comment: Sit<>stand with Mod I, unable to tolerate standing for more than a few seconds.  Ambulation/Gait             General Gait Details: not tested due to drop in O2 sats with standing  Stairs            Wheelchair Mobility    Modified [name] (Stroke Patients Only)       Balance Overall balance assessment: Modified Independent                                           Pertinent Vitals/Pain Pain Assessment: 0-10 Pain Location: not rated, L lower leg Pain Descriptors  / Indicators: Aching;Tender Pain Intervention(s): Limited activity within patient's tolerance;Monitored during session    Home Living Family/patient expects to be discharged to:: Assisted living               Home Equipment: Wheelchair - power Additional Comments: Ambulates in small apartment holding furniture/walls, too small to use w/c in apt.    Prior Function Level of Independence: Needs assistance   Gait / Transfers Assistance Needed: electric w/c     Comments: meals in dining room     Hand Dominance        Extremity/Trunk Assessment   Upper Extremity Assessment: Overall WFL for tasks assessed           Lower Extremity Assessment: Overall WFL for tasks assessed         Communication   Communication: No difficulties  Cognition Arousal/Alertness: Awake/alert Behavior During Therapy: WFL for tasks assessed/performed (crying) Overall Cognitive Status: Within Functional Limits for tasks assessed                      General Comments General comments (skin integrity, edema, etc.): L LE with redness and tenderness to touch, warm    Exercises        Assessment/Plan    PT Assessment Patient needs continued  PT services  PT Diagnosis Difficulty walking;Generalized weakness   PT Problem List Decreased strength;Decreased activity tolerance;Decreased mobility;Cardiopulmonary status limiting activity;Pain  PT Treatment Interventions Functional mobility training;Gait training;Therapeutic activities;Therapeutic exercise;Patient/family education   PT Goals (Current goals can be found in the Care Plan section) Acute Rehab PT Goals Patient Stated Goal: "I want to stay strong if I can." PT Goal Formulation: With patient Time For Goal Achievement: 11/18/15 Potential to Achieve Goals: Fair    Frequency Min 2X/week   Barriers to discharge Decreased caregiver support      Co-evaluation               End of Session Equipment Utilized During  Treatment: Oxygen (6L) Activity Tolerance: Patient limited by fatigue   Nurse Communication: Mobility status         Time: 1040-1110 PT Time Calculation (min) (ACUTE ONLY): 30 min   Charges:   PT Evaluation $PT Eval Moderate Complexity: 1 Procedure     PT G Codes:        Kaitlin Henderson A Arie Powell 11/09/15, 12:39 PM

## 2015-11-04 NOTE — Consult Note (Signed)
Pharmacy Antibiotic Note  Kaitlin Henderson is a 75 y.o. female admitted on 11/02/2015 with COPD exacerbation.  Pharmacy has been consulted for levofloxacin dosing.  Plan: Levofloxacin amended to Q24H for creatinine clearance greater than 49 mL/min.   Height: 5\' 2"  (157.5 cm) Weight: 202 lb 12.8 oz (91.989 kg) IBW/kg (Calculated) : 50.1  Temp (24hrs), Avg:97.6 F (36.4 C), Min:96.7 F (35.9 C), Max:98.5 F (36.9 C)   Recent Labs Lab 11/02/15 1602 11/02/15 1752 11/02/15 2132 11/03/15 0637 11/04/15 0335  WBC 7.8  --   --  5.5  --   CREATININE 0.97  --   --  1.04* 0.92  LATICACIDVEN  --  1.0 1.6  --   --     Estimated Creatinine Clearance: 55.8 mL/min (by C-G formula based on Cr of 0.92).    Allergies  Allergen Reactions  . Codeine     REACTION: makes pt pass out  . Levaquin [Levofloxacin In D5w] Other (See Comments)    redness  . Sulfonamide Derivatives     REACTION: edema    Antimicrobials this admission: Vancomycin 3/2 >> 3/3 zosyn 3/2 >> 3/3 Levofloxacin 3/3>>   Dose adjustments this admission:   Microbiology results: 3/2 BCx: pending 3/2 UCx: insignificant growth    Thank you for allowing pharmacy to be a part of this patient's care.  Laural Benes, Pharm.D., BCPS Clinical Pharmacist 11/04/2015 5:57 AM

## 2015-11-04 NOTE — Consult Note (Addendum)
Kaitlin Henderson Consultation      Assessment and Plan:  75 year old woman with severe emphysema, now with acute exacerbation of COPD, and possible pulmonary edema. Incidental finding of right upper lobe nodule.  Acute exacerbation of COPD. -Continue IV steroids, Levaquin. -Severe baseline COPD, dependent on 2 L of oxygen, currently on 6 L of oxygen. Wean down oxygen as tolerated. -The patient has not been taking her medications due to cost, will consult social work to see if there are any patient assistance programs which may be helpful to her.  Right upper lobe lung nodule. -I discussed in depth with her the possibility that this lung nodule,  could represent cancer. Given  poor functional status, with debility, deconditioning, she would appear to be a poor candidate for chemotherapy or surgical resection. In addition, given the area of the lung nodule. She would be at high risk of developing pneumothorax, or bleeding complications. She is also at high risk of death, given the severity of her baseline pulmonary disease, therefore, we discussed that it may be best to hold off on any potential diagnostic procedures at this time. She expressed her agreement, expressed that she does not want a biopsy at this time. We can reevaluate this in the future once her status improves.  Debility/deconditioning. -May benefit from physical therapy.  Acute hypoxic and hypercapnic respiratory failure.? Pulmonary edema. -As above secondary to COPD exacerbation, with severe baseline COPD. Continue nebulized medications and steroids. -. Treatment with Lasix today to see if hypoxia improves.  CODE STATUS: DO NOT RESUSCITATE. -I had an in-depth discussion with the patient regarding her baseline status and the likelihood that she will not be able to improve the status and will likely be worse after this hospitalization. I also informed her that if she should end up on a ventilator. It would be  difficult to get her off given her severe baseline laboratory failure. She expressed that she wouldn't want to live, but would not want to be kept alive by artificial means. She is agreeable to changing her CODE STATUS to DO NOT RESUSCITATE.     Date: 11/04/2015  MRN# [id number] Kaitlin Henderson 05-14-1941  Referring Physician:   REEANNA Henderson is a 75 y.o. old female seen in consultation for chief complaint of:    Chief Complaint  Patient presents with  . Shortness of Breath    HPI:   The patient is an 75 year old female with a history of severe emphysema, dependent on 2 L of oxygen at all times. She was admitted to the hospital with progressive dyspnea on exertion, as well as dyspnea at rest. The symptoms have been coming on over the last 1 week, though she tells me that they've been slowly progressive over the previous 3-4 weeks. She has been admitted to the hospital and treated for COPD exacerbation. She is currently on IV steroids with mild improvement in her dyspnea. She had a CT of the chest and chest x-ray, I reviewed these films, which showed a small spiculated nodule in the right apex. There is also bilateral diffuse emphysematous changes throughout both lungs. Review of her arterial blood gas from this admission shows a pH 7.4/52/53/32.2, consistent with chronic hypercapnic respiratory failure, acute hypoxic respiratory failure. Her blood and urine cultures have been negative thus far, influenza screen negative.  She lives in an assisted living facility, she tells me that her baseline functional status is very poor. She does not walk, and is wheelchair-bound due to leg problems  and dyspnea. She becomes dyspneic with transferring from the wheelchair to the bed and take several minutes to recover, she showers in a shower chair and is winded when she bathes herself. She tells me she is supposed to be on Spiriva, as prescribed by her pulmonologist, Dr. Joya Gaskins, who she has not seen in  approximately 18 months. She does not take any of her inhaled medications due to their cost. She tells me that her are not involved in her life, she is  tearful about this.   PMHX:   Past Medical History  Diagnosis Date  . Breast cancer, right breast ([place]) 1998    s/p chemo & XRT, right mastectomy  . OBESITY   . CORONARY ARTERY DISEASE   . APHTHOUS STOMATITIS   . DEPRESSION     started sertraline 09/2010  . DIABETES, TYPE 2 dx 03/2010  . HYPERLIPIDEMIA   . Hypothyroidism   . C O P D     chronic O2 3LPM [place]  . Essential tremor   . NSTEMI (non-ST elevated myocardial infarction) ([place]) 01/2012    med mgmt  . Anginal pain ([place])   . GERD (gastroesophageal reflux disease)    Surgical Hx:  Past Surgical History  Procedure Laterality Date  . Appendectomy    . Cholecystectomy    . Abdominal hysterectomy    . Right mastectomy    . Tonsillectomy    . Mastectomy     Family Hx:  Family History  Problem Relation Age of Onset  . Diabetes Neg Hx   . Cancer Neg Hx    Social Hx:   Social History  Substance Use Topics  . Smoking status: Former Smoker -- 2.50 packs/day for 54 years    Types: Cigarettes    Quit date: 09/02/2006  . Smokeless tobacco: Never Used     Comment: Retired. Widow/widower since 2008-lives alone. Home hospice since 06/2009 related to COPD  . Alcohol Use: No   Medication:   No current outpatient prescriptions on file.    Allergies:  Codeine; Levaquin; and Sulfonamide derivatives  Review of Systems: Gen:  Denies  fever, sweats, chills HEENT: Denies blurred vision, double vision. bleeds, sore throat Cvc:  No dizziness, chest pain. Resp:   Denies cough or sputum porduction,  Gi: Denies swallowing difficulty, stomach pain. Gu:  Denies bladder incontinence, burning urine Ext:   No Joint pain, stiffness. Skin: No skin rash,  hives Endoc:  No polyuria, polydipsia. Psych: No depression, insomnia. Other:  All other systems were reviewed with the patient and were  negative other that what is mentioned in the HPI.   Physical Examination:   VS: BP 128/64 mmHg  Pulse 106  Temp(Src) 96.5 F (35.8 C) (Oral)  Resp 36  Ht 5\' 2"  (1.575 m)  Wt 202 lb 12.8 oz (91.989 kg)  BMI 37.08 kg/m2  SpO2 87%  General Appearance: No distress  Neuro:without focal findings,  speech normal,  HEENT: PERRLA, EOM intact.   Pulmonary: Decreased breath sounds bilaterally, with scattered wheezing. CardiovascularNormal S1,S2.  No m/r/g.   Abdomen: Benign, Soft, non-tender. Renal:  No costovertebral tenderness  GU:  No performed at this time. Endoc: No evident thyromegaly, no signs of acromegaly. Skin:   warm, no rashes, no ecchymosis  Extremities: normal, no cyanosis, clubbing.  Other findings:    LABORATORY PANEL:   CBC  Recent Labs Lab 11/03/15 0637  WBC 5.5  HGB 15.9  HCT 47.1*  PLT 261   ------------------------------------------------------------------------------------------------------------------  Chemistries  Recent Labs Lab 11/02/15 1602 11/03/15 0637 11/04/15 0335  NA 142 144  --   K 4.0 4.6  --   CL 99* 103  --   CO2 34* 32  --   GLUCOSE 138* 187*  --   BUN 15 15  --   CREATININE 0.97 1.04* 0.92  CALCIUM 8.9 8.7*  --   MG 2.1  --   --   AST 21  --   --   ALT 13*  --   --   ALKPHOS 69  --   --   BILITOT 1.0  --   --    ------------------------------------------------------------------------------------------------------------------  Cardiac Enzymes  Recent Labs Lab 11/02/15 1602  TROPONINI 0.03   ------------------------------------------------------------  RADIOLOGY:  Ct Chest W Contrast  11/02/2015  CLINICAL DATA:  Shortness of Breath and cough EXAM: CT CHEST WITH CONTRAST TECHNIQUE: Multidetector CT imaging of the chest was performed during intravenous contrast administration. CONTRAST:  68mL OMNIPAQUE IOHEXOL 300 MG/ML  SOLN COMPARISON:  Plain film from earlier in the same day FINDINGS: Lungs are well aerated  bilaterally and demonstrate bilateral emphysematous changes. A minimal amount of atelectasis is noted in the right middle lobe. No sizable effusion is seen. In the right upper lobe however there is a 2.1 cm mildly spiculated pleural-based lesion identified. The thoracic inlet is within normal limits. Aortic calcifications are noted without aneurysmal dilatation. No hilar or mediastinal adenopathy is seen. The visualized upper abdomen is within normal limits with the exception of evidence of prior cholecystectomy. Postsurgical changes are noted in the right axilla and changes of prior mastectomy are seen. IMPRESSION: Emphysematous changes. 2.1 cm mildly spiculated pleural-based lesion in the right apex laterally. This is suspicious for underlying carcinoma and further evaluation is recommended. Electronically Signed   By: Inez Catalina M.D.   On: 11/02/2015 19:56   Dg Chest Portable 1 View  11/02/2015  CLINICAL DATA:  Hypoxemia and shortness of breath. EXAM: PORTABLE CHEST 1 VIEW COMPARISON:  09/13/2013 chest radiograph. FINDINGS: Stable chronic mild right mediastinal shift. Stable cardiomediastinal silhouette with top-normal heart size. No pneumothorax. No pleural effusion. No pulmonary edema. Mild reticular opacities at the right lung base, not appreciably changed, favor scarring or atelectasis. No acute consolidative airspace disease. Surgical clips overlie the right axilla. IMPRESSION: Stable right basilar reticular opacities, favor scarring or atelectasis. No acute consolidative airspace disease. Electronically Signed   By: Ilona Sorrel M.D.   On: 11/02/2015 16:43       Thank  you for the consultation and for allowing [facility] to assist in the care of your patient. Our recommendations are noted above.  Please contact us if we can be of further service.   Marda Stalker, MD.  Board Certified in Internal Henderson, Pulmonary [place], [place], and Sleep  [place]enderson.  [facility]   Patricia Pesa, M.D.  Vilinda Boehringer, M.D.  Merton Border, M.D

## 2015-11-05 LAB — GLUCOSE, CAPILLARY
GLUCOSE-CAPILLARY: 346 mg/dL — AB (ref 65–99)
Glucose-Capillary: 220 mg/dL — ABNORMAL HIGH (ref 65–99)
Glucose-Capillary: 291 mg/dL — ABNORMAL HIGH (ref 65–99)
Glucose-Capillary: 317 mg/dL — ABNORMAL HIGH (ref 65–99)

## 2015-11-05 LAB — GASTROINTESTINAL PANEL BY PCR, STOOL (REPLACES STOOL CULTURE)
Adenovirus F40/41: NOT DETECTED
Astrovirus: NOT DETECTED
CRYPTOSPORIDIUM: NOT DETECTED
CYCLOSPORA CAYETANENSIS: NOT DETECTED
Campylobacter species: NOT DETECTED
E. coli O157: NOT DETECTED
ENTAMOEBA HISTOLYTICA: NOT DETECTED
ENTEROAGGREGATIVE E COLI (EAEC): NOT DETECTED
Enteropathogenic E coli (EPEC): NOT DETECTED
Enterotoxigenic E coli (ETEC): NOT DETECTED
GIARDIA LAMBLIA: NOT DETECTED
Norovirus GI/GII: NOT DETECTED
Plesimonas shigelloides: NOT DETECTED
Rotavirus A: NOT DETECTED
SALMONELLA SPECIES: NOT DETECTED
SHIGELLA/ENTEROINVASIVE E COLI (EIEC): NOT DETECTED
Sapovirus (I, II, IV, and V): NOT DETECTED
Shiga like toxin producing E coli (STEC): NOT DETECTED
VIBRIO CHOLERAE: NOT DETECTED
Vibrio species: NOT DETECTED
YERSINIA ENTEROCOLITICA: NOT DETECTED

## 2015-11-05 LAB — MRSA PCR SCREENING: MRSA BY PCR: NEGATIVE

## 2015-11-05 MED ORDER — VANCOMYCIN HCL IN DEXTROSE 750-5 MG/150ML-% IV SOLN
750.0000 mg | INTRAVENOUS | Status: DC
Start: 2015-11-06 — End: 2015-11-06
  Administered 2015-11-06: 750 mg via INTRAVENOUS
  Filled 2015-11-05 (×2): qty 150

## 2015-11-05 MED ORDER — DEXTROSE 5 % IV SOLN
500.0000 mg | INTRAVENOUS | Status: DC
  Administered 2015-11-05 – 2015-11-07 (×3): 500 mg via INTRAVENOUS
  Filled 2015-11-05 (×4): qty 500

## 2015-11-05 MED ORDER — IPRATROPIUM-ALBUTEROL 0.5-2.5 (3) MG/3ML IN SOLN: 3.0000 mL | Freq: Four times a day (QID) | RESPIRATORY_TRACT | Status: DC | PRN

## 2015-11-05 MED ORDER — PIPERACILLIN-TAZOBACTAM 3.375 G IVPB
3.3750 g | Freq: Three times a day (TID) | INTRAVENOUS | Status: DC
  Administered 2015-11-05 – 2015-11-09 (×11): 3.375 g via INTRAVENOUS
  Filled 2015-11-05 (×14): qty 50

## 2015-11-05 MED ORDER — ENOXAPARIN SODIUM 100 MG/ML ~~LOC~~ SOLN
1.0000 mg/kg | Freq: Two times a day (BID) | SUBCUTANEOUS | Status: DC
  Administered 2015-11-05 – 2015-11-08 (×6): 95 mg via SUBCUTANEOUS
  Filled 2015-11-05 (×8): qty 1

## 2015-11-05 MED ORDER — IPRATROPIUM-ALBUTEROL 0.5-2.5 (3) MG/3ML IN SOLN
3.0000 mL | Freq: Three times a day (TID) | RESPIRATORY_TRACT | Status: DC
  Administered 2015-11-06 (×2): 3 mL via RESPIRATORY_TRACT
  Filled 2015-11-05 (×2): qty 3

## 2015-11-05 MED ORDER — VANCOMYCIN HCL IN DEXTROSE 1-5 GM/200ML-% IV SOLN
1000.0000 mg | Freq: Once | INTRAVENOUS | Status: AC
  Administered 2015-11-05: 1000 mg via INTRAVENOUS
  Filled 2015-11-05: qty 200

## 2015-11-05 NOTE — Progress Notes (Signed)
Patient ID: Kaitlin Henderson, female   DOB: 1941/03/25, 75 y.o.   MRN: [id number] [facility] PROGRESS NOTE  Kaitlin Henderson [id number] DOB: 1940-11-21 DOA: 11/02/2015 PCP: Kaitlin Grant, MD  HPI/Subjective: Patient is on high flow nasal cannula 65% FiO2. She feels a little bit better today than yesterday. Still with cough and shortness of breath and wheezing.  Objective: Filed Vitals:   11/04/15 2004 11/05/15 0446  BP: 132/70 140/72  Pulse: 85 82  Temp: 96.5 F (35.8 C) 97.2 F (36.2 C)  Resp: 23 21    Filed Weights   11/02/15 1558 11/02/15 2022 11/05/15 0446  Weight: 97.523 kg (215 lb) 91.989 kg (202 lb 12.8 oz) 94.892 kg (209 lb 3.2 oz)    ROS: Review of Systems  Constitutional: Negative for fever and chills.  Eyes: Negative for blurred vision.  Respiratory: Positive for cough, shortness of breath and wheezing.   Cardiovascular: Negative for chest pain.  Gastrointestinal: Negative for nausea, vomiting, abdominal pain, diarrhea and constipation.  Genitourinary: Negative for dysuria.  Musculoskeletal: Negative for joint pain.  Neurological: Negative for dizziness and headaches.   Exam: Physical Exam  Constitutional: She is oriented to person, place, and time.  HENT:  Nose: No mucosal edema.  Mouth/Throat: No oropharyngeal exudate or posterior oropharyngeal edema.  Eyes: Conjunctivae, EOM and lids are normal. Pupils are equal, round, and reactive to light.  Neck: No JVD present. Carotid bruit is not present. No edema present. No thyroid mass and no thyromegaly present.  Cardiovascular: S1 normal and S2 normal.  Tachycardia present.  Exam reveals no gallop.   No murmur heard. Pulses:      Dorsalis pedis pulses are 2+ on the right side, and 2+ on the left side.  Respiratory: No respiratory distress. She has decreased breath sounds in the right middle field, the right lower field, the left middle field and the left lower field. She has wheezes in the  right lower field and the left lower field. She has no rhonchi. She has no rales.  GI: Soft. Bowel sounds are normal. There is no tenderness.  Musculoskeletal:       Right ankle: She exhibits swelling.       Left ankle: She exhibits swelling.  Lymphadenopathy:    She has no cervical adenopathy.  Neurological: She is alert and oriented to person, place, and time. No cranial nerve deficit.  Skin: Skin is warm. Nails show no clubbing.  Slight erythema left lower extremity  Psychiatric: She has a normal mood and affect.      Data Reviewed: Basic Metabolic Panel:  Recent Labs Lab 11/02/15 1602 11/03/15 0637 11/04/15 0335  NA 142 144  --   K 4.0 4.6  --   CL 99* 103  --   CO2 34* 32  --   GLUCOSE 138* 187*  --   BUN 15 15  --   CREATININE 0.97 1.04* 0.92  CALCIUM 8.9 8.7*  --   MG 2.1  --   --    Liver Function Tests:  Recent Labs Lab 11/02/15 1602  AST 21  ALT 13*  ALKPHOS 69  BILITOT 1.0  PROT 7.4  ALBUMIN 3.6   CBC:  Recent Labs Lab 11/02/15 1602 11/03/15 0637  WBC 7.8 5.5  NEUTROABS 5.0  --   HGB 15.8 15.9  HCT 46.9 47.1*  MCV 95.8 98.0  PLT 281 261   CBG:  Recent Labs Lab 11/04/15 1125 11/04/15 1611 11/04/15 2106 11/05/15 0820 11/05/15  [place]* 207* 212* 220* 291*    Recent Results (from the past 240 hour(s))  Urine culture     Status: None (Preliminary result)   Collection Time: 11/02/15  4:01 PM  Result Value Ref Range Status   Specimen Description URINE, CLEAN CATCH  Final   Special Requests Normal  Final   Culture HOLDING FOR POSSIBLE PATHOGEN  Final   Report Status PENDING  Incomplete  Blood Culture (routine x 2)     Status: None (Preliminary result)   Collection Time: 11/02/15  5:53 PM  Result Value Ref Range Status   Specimen Description BLOOD LEFT ASSIST CONTROL  Final   Special Requests   Final    BOTTLES DRAWN AEROBIC AND ANAEROBIC  4CC AERO, 5CC ANA   Culture NO GROWTH 3 DAYS  Final   Report Status PENDING   Incomplete  Blood Culture (routine x 2)     Status: None (Preliminary result)   Collection Time: 11/02/15  5:55 PM  Result Value Ref Range Status   Specimen Description BLOOD LEFT HAND  Final   Special Requests   Final    BOTTLES DRAWN AEROBIC AND ANAEROBIC  6CC AERO, 3CC ANA   Culture NO GROWTH 3 DAYS  Final   Report Status PENDING  Incomplete      Scheduled Meds: . budesonide (PULMICORT) nebulizer solution  0.25 mg Nebulization BID  . enoxaparin (LOVENOX) injection  1 mg/kg Subcutaneous Q12H  . insulin aspart  0-9 Units Subcutaneous TID WC  . ipratropium-albuterol  3 mL Nebulization Q6H  . levofloxacin (LEVAQUIN) IV  750 mg Intravenous Q24H  . methylPREDNISolone (SOLU-MEDROL) injection  60 mg Intravenous Q6H  . pantoprazole  40 mg Oral Daily  . piperacillin-tazobactam (ZOSYN)  IV  3.375 g Intravenous 3 times per day  . sodium chloride flush  3 mL Intravenous Q12H  . sodium chloride flush  3 mL Intravenous Q12H  . vancomycin  1,000 mg Intravenous Once  . [START ON 11/06/2015] vancomycin  750 mg Intravenous Q18H    Assessment/Plan:  1. Acute on chronic respiratory failure. Patient currently on high flow nasal cannula 65% FiO2. 2. COPD exacerbation continue high-dose steroids and nebulizer treatments continue budesonide. Patient is on Levaquin. 3. 2.1 cm spiculated lung nodule. Right now patient is in no condition to have a lung biopsy 4. Type 2 diabetes- on sliding scale. Sugars will be high on steroids. 5. Anxiety on Xanax 6. Gastroesophageal reflux disease without esophagitis on Protonix 7. Previous diarrhea- no further diarrhea at this point 8. Weakness- likely will need rehabilitation 9. Left lower extremity cellulitis continue IV Levaquin  Code Status: DO NOT RESUSCITATE    Code Status Orders         Code Status History    Date Active Date Inactive Code Status Order ID Comments User Context   01/17/2012  8:32 PM 01/28/2012  4:49 PM DNR [id number]  Mila Palmer, RN Inpatient     Family Communication: Patient did not want me to contact her daughter Disposition Plan: To be determined  Antibiotics:  Levaquin  Time spent: 22 minutes  Loletha Grayer  [facility]

## 2015-11-05 NOTE — Progress Notes (Signed)
°   11/05/15 1600  Clinical Encounter Type  Visited With Patient  Visit Type Follow-up  Consult/Referral To Chaplain  Spiritual Encounters  Spiritual Needs Literature  Follow-up pastoral care visit with patient.  Patient requested Bible. Patient's nurse secured one, as did I. [place] Ext [phone]

## 2015-11-05 NOTE — Progress Notes (Signed)
CSW met with patient at bedside to confirm d/c plan. Patient was very emotional and tearful as she began talking about returning back to [facility] with hospice. Per patient " I just want to go back home, and wait for the good Lord to get me". CSW provided emotional support to patient.  Patient states she has received Therapy before and is was not helpful. Patient request is to return back to [place] with Hospice. In closing patient told CSW " I just want to be normal like everybody else".  RNCM Lyn was notified. CSW will continue to follow.     Anitra Lauth, BSW, MSW, [facility] Work Dept [phone]

## 2015-11-05 NOTE — Progress Notes (Signed)
ANTICOAGULATION CONSULT NOTE - Initial Consult  Pharmacy Consult for lovenox Indication: pulmonary embolus (r/o)  Allergies  Allergen Reactions  . Codeine     REACTION: makes pt pass out  . Levaquin [Levofloxacin In D5w] Other (See Comments)    redness  . Sulfonamide Derivatives     REACTION: edema    Patient Measurements: Height: 5\' 2"  (157.5 cm) Weight: 209 lb 3.2 oz (94.892 kg) IBW/kg (Calculated) : 50.1   Vital Signs: Temp: 97.2 F (36.2 C) (03/05 0446) Temp Source: Oral (03/05 0446) BP: 140/72 mmHg (03/05 0446) Pulse Rate: 82 (03/05 0446)  Labs:  Recent Labs  11/02/15 1602 11/02/15 2132 11/03/15 0637 11/04/15 0335  HGB 15.8  --  15.9  --   HCT 46.9  --  47.1*  --   PLT 281  --  261  --   APTT  --  25  --   --   LABPROT  --  14.2  --   --   INR  --  1.08  --   --   CREATININE 0.97  --  1.04* 0.92  TROPONINI 0.03  --   --   --     Estimated Creatinine Clearance: 56.7 mL/min (by C-G formula based on Cr of 0.92).   Assessment: 75 yo female ordered to start enoxaparin for possible pulmonary embolism  Last dose of enoxaparin 40 mg at 2110 last night 3/3 Hgb 15.9, Plt 261 3/4 SCr 0.92 CrCl ~57 ml/min  Goal of Therapy:  Monitor platelets by anticoagulation protocol: Yes   Plan:  Enoxaparin 1 mg /kg q12h based on current renal function Follow up CBC in AM  Rayna Sexton L 11/05/2015,1:48 PM

## 2015-11-05 NOTE — Consult Note (Signed)
[facility] Consultation      Assessment and Plan:  75 year old woman with severe emphysema, now with acute exacerbation of COPD, and possible pulmonary edema. Incidental finding of right upper lobe nodule.  Acute exacerbation of COPD. -Continue IV steroids, Levaquin. -Severe baseline COPD, dependent on 2 L of oxygen, currently on 6 L of oxygen. Wean down oxygen as tolerated. -The patient has not been taking her medications due to cost, will consult social work to see if there are any patient assistance programs which may be helpful to her.  Right upper lobe lung nodule. -I discussed in depth with her the possibility that this lung nodule,  could represent cancer. Given  poor functional status, with debility, deconditioning, she would appear to be a poor candidate for chemotherapy or surgical resection. In addition, given the area of the lung nodule. She would be at high risk of developing pneumothorax, or bleeding complications. She is also at high risk of death, given the severity of her baseline pulmonary disease, therefore, we discussed that it may be best to hold off on any potential diagnostic procedures at this time. She expressed her agreement, expressed that she does not want a biopsy at this time. We can reevaluate this in the future once her status improves.  Debility/deconditioning. -May benefit from physical therapy.  Acute hypoxic and hypercapnic respiratory failure.? Pulmonary edema. -As above secondary to COPD exacerbation, with severe baseline COPD. Continue nebulized medications and steroids. -No improvement with empiric diuresis, will start empiric Lovenox, as well as empiric antibiotics, will check MRSA screen.  CODE STATUS: DO NOT RESUSCITATE. -I had an in-depth discussion again today with the patient and her power of attorney who is her neighbor was present, she was often very confused about CODE STATUS and what to do. However, she ultimately  reaffirmed as well as her power of attorney, that she would like to be DO NOT RESUSCITATE. We'll consult palliative care.    Date: 11/05/2015  MRN# [id number] KEITRA CHRIST October 30, 1940  Referring Physician:   MEKEBA ELEDGE is a 75 y.o. old female seen in consultation for chief complaint of:    Chief Complaint  Patient presents with  . Shortness of Breath    HPI:   The patient is an 75 year old female with a history of severe emphysema, dependent on 2 L of oxygen at all times. She was admitted to the hospital with progressive dyspnea on exertion, as well as dyspnea at rest. The symptoms have been coming on over the last 1 week, though she tells me that they've been slowly progressive over the previous 3-4 weeks. She has been admitted to the hospital and treated for COPD exacerbation. She is currently on IV steroids with mild improvement in her dyspnea. She had a CT of the chest and chest x-ray, I reviewed these films, which showed a small spiculated nodule in the right apex. There is also bilateral diffuse emphysematous changes throughout both lungs. Review of her arterial blood gas from this admission shows a pH 7.4/52/53/32.2, consistent with chronic hypercapnic respiratory failure, acute hypoxic respiratory failure. Her blood and urine cultures have been negative thus far, influenza screen negative.  She lives in an assisted living facility, she tells me that her baseline functional status is very poor. She does not walk, and is wheelchair-bound due to leg problems and dyspnea. She becomes dyspneic with transferring from the wheelchair to the bed and take several minutes to recover, she showers in a shower chair and is  winded when she bathes herself. She tells me she is supposed to be on Spiriva, as prescribed by her pulmonologist, Dr. Joya Gaskins, who she has not seen in approximately 18 months. She does not take any of her inhaled medications due to their cost. She tells me that her are not  involved in her life, she is  tearful about this.   PMHX:   Past Medical History  Diagnosis Date  . Breast cancer, right breast ([place]) 1998    s/p chemo & XRT, right mastectomy  . OBESITY   . CORONARY ARTERY DISEASE   . APHTHOUS STOMATITIS   . DEPRESSION     started sertraline 09/2010  . DIABETES, TYPE 2 dx 03/2010  . HYPERLIPIDEMIA   . Hypothyroidism   . C O P D     chronic O2 3LPM [place]  . Essential tremor   . NSTEMI (non-ST elevated myocardial infarction) ([place]) 01/2012    med mgmt  . Anginal pain ([place])   . GERD (gastroesophageal reflux disease)    Surgical Hx:  Past Surgical History  Procedure Laterality Date  . Appendectomy    . Cholecystectomy    . Abdominal hysterectomy    . Right mastectomy    . Tonsillectomy    . Mastectomy     Family Hx:  Family History  Problem Relation Age of Onset  . Diabetes Neg Hx   . Cancer Neg Hx    Social Hx:   Social History  Substance Use Topics  . Smoking status: Former Smoker -- 2.50 packs/day for 54 years    Types: Cigarettes    Quit date: 09/02/2006  . Smokeless tobacco: Never Used     Comment: Retired. Widow/widower since 2008-lives alone. Home hospice since 06/2009 related to COPD  . Alcohol Use: No   Medication:   No current outpatient prescriptions on file.    Allergies:  Codeine; Levaquin; and Sulfonamide derivatives  Review of Systems: Gen:  Denies  fever, sweats, chills HEENT: Denies blurred vision, double vision. Cvc:  No dizziness, chest pain. Resp:   Denies cough or sputum.  Gi: Denies swallowing difficulty, stomach pain. Gu:  Denies bladder incontinence, burning urine Ext:   No Joint pain, stiffness. Skin: No skin rash,  hives Endoc:  No polyuria, polydipsia. Psych: No depression, insomnia. Other:  All other systems were reviewed with the patient and were negative other that what is mentioned in the HPI.   Physical Examination:   VS: BP 140/72 mmHg  Pulse 82  Temp(Src) 97.2 F (36.2 C) (Oral)  Resp  21  Ht 5\' 2"  (1.575 m)  Wt 209 lb 3.2 oz (94.892 kg)  BMI 38.25 kg/m2  SpO2 92%  General Appearance: No distress  Neuro:without focal findings,  speech normal,  HEENT: PERRLA, EOM intact.   Pulmonary: Decreased breath sounds bilaterally, with scattered wheezing. CardiovascularNormal S1,S2.  No m/r/g.   Abdomen: Benign, Soft, non-tender. Renal:  No costovertebral tenderness  GU:  No performed at this time. Endoc: No evident thyromegaly, no signs of acromegaly. Skin:   warm, no rashes, no ecchymosis  Extremities: normal, no cyanosis, clubbing.  Other findings:    LABORATORY PANEL:   CBC  Recent Labs Lab 11/03/15 0637  WBC 5.5  HGB 15.9  HCT 47.1*  PLT 261   ------------------------------------------------------------------------------------------------------------------  Chemistries   Recent Labs Lab 11/02/15 1602 11/03/15 0637 11/04/15 0335  NA 142 144  --   K 4.0 4.6  --   CL 99* 103  --  CO2 34* 32  --   GLUCOSE 138* 187*  --   BUN 15 15  --   CREATININE 0.97 1.04* 0.92  CALCIUM 8.9 8.7*  --   MG 2.1  --   --   AST 21  --   --   ALT 13*  --   --   ALKPHOS 69  --   --   BILITOT 1.0  --   --    ------------------------------------------------------------------------------------------------------------------  Cardiac Enzymes  Recent Labs Lab 11/02/15 1602  TROPONINI 0.03   ------------------------------------------------------------  RADIOLOGY:  No results found.     Thank  you for the consultation and for allowing [facility] to assist in the care of your patient. Our recommendations are noted above.  Please contact us if we can be of further service.   Marda Stalker, MD.  Board Certified in Internal Medicine, Pulmonary Medicine, [place], and Sleep Medicine.  [facility]   Patricia Pesa, M.D.  Vilinda Boehringer, M.D.  Merton Border, M.D

## 2015-11-05 NOTE — Care Management Important Message (Signed)
Important Message  Patient Details  Name: IAYANA FOXX MRN: [id number] Date of Birth: 11-01-1940   Medicare Important Message Given:  Yes    Michaele Amundson A, RN 11/05/2015, 4:34 PM

## 2015-11-05 NOTE — Progress Notes (Signed)
Pharmacy Antibiotic Note  Kaitlin Henderson is a 75 y.o. female admitted on 11/02/2015 with COPD exacerbation now broadening abx coverage for PNA.  Pharmacy has been consulted for vancomycin and Zosyn dosing.  Plan: Vancomycin 1 g IV x1 Vancomycin 750 mg IV every 18 hours.  Goal trough 15-20 mcg/mL. Zosyn 3.375g IV q8h (4 hour infusion).   Vanc trough before 4th dose - 3/8 at 0500 SCr in AM to continue to monitor renal function   Height: 5\' 2"  (157.5 cm) Weight: 209 lb 3.2 oz (94.892 kg) IBW/kg (Calculated) : 50.1  Temp (24hrs), Avg:97.2 F (36.2 C), Min:96.5 F (35.8 C), Max:97.8 F (36.6 C)   Recent Labs Lab 11/02/15 1602 11/02/15 1752 11/02/15 2132 11/03/15 0637 11/04/15 0335  WBC 7.8  --   --  5.5  --   CREATININE 0.97  --   --  1.04* 0.92  LATICACIDVEN  --  1.0 1.6  --   --     Estimated Creatinine Clearance: 56.7 mL/min (by C-G formula based on Cr of 0.92).    Allergies  Allergen Reactions  . Codeine     REACTION: makes pt pass out  . Levaquin [Levofloxacin In D5w] Other (See Comments)    redness  . Sulfonamide Derivatives     REACTION: edema    Antimicrobials this admission: levofloxacin 3/3 >>  Vancomycin 3/3, 3/5>> Zosyn 3/2-3/3, 3/5 >>   Dose adjustments this admission:   Microbiology results: 3/2 BCx: NGTD 3/2 UCx: pending  3/5 MRSA PCR: sent  Thank you for allowing pharmacy to be a part of this patient's care.  Rocky Morel 11/05/2015 1:51 PM

## 2015-11-06 ENCOUNTER — Inpatient Hospital Stay: Payer: Medicare Other

## 2015-11-06 DIAGNOSIS — Z9221 Personal history of antineoplastic chemotherapy: Secondary | ICD-10-CM

## 2015-11-06 DIAGNOSIS — Z87891 Personal history of nicotine dependence: Secondary | ICD-10-CM

## 2015-11-06 DIAGNOSIS — R911 Solitary pulmonary nodule: Secondary | ICD-10-CM

## 2015-11-06 DIAGNOSIS — Z9011 Acquired absence of right breast and nipple: Secondary | ICD-10-CM

## 2015-11-06 DIAGNOSIS — Z515 Encounter for palliative care: Secondary | ICD-10-CM

## 2015-11-06 DIAGNOSIS — J9622 Acute and chronic respiratory failure with hypercapnia: Secondary | ICD-10-CM

## 2015-11-06 DIAGNOSIS — Z923 Personal history of irradiation: Secondary | ICD-10-CM

## 2015-11-06 DIAGNOSIS — Z853 Personal history of malignant neoplasm of breast: Secondary | ICD-10-CM

## 2015-11-06 DIAGNOSIS — J9621 Acute and chronic respiratory failure with hypoxia: Secondary | ICD-10-CM | POA: Insufficient documentation

## 2015-11-06 LAB — CBC
HCT: 42.1 % (ref 35.0–47.0)
HEMOGLOBIN: 14.3 g/dL (ref 12.0–16.0)
MCH: 32.5 pg (ref 26.0–34.0)
MCHC: 34 g/dL (ref 32.0–36.0)
MCV: 95.7 fL (ref 80.0–100.0)
Platelets: 230 10*3/uL (ref 150–440)
RBC: 4.4 MIL/uL (ref 3.80–5.20)
RDW: 13.1 % (ref 11.5–14.5)
WBC: 7.7 10*3/uL (ref 3.6–11.0)

## 2015-11-06 LAB — URINE CULTURE: Special Requests: NORMAL

## 2015-11-06 LAB — CREATININE, SERUM
Creatinine, Ser: 0.91 mg/dL (ref 0.44–1.00)
GFR calc non Af Amer: >60 mL/min (ref >60)

## 2015-11-06 LAB — GLUCOSE, CAPILLARY
GLUCOSE-CAPILLARY: 259 mg/dL — AB (ref 65–99)
GLUCOSE-CAPILLARY: 287 mg/dL — AB (ref 65–99)
GLUCOSE-CAPILLARY: 276 mg/dL — AB (ref 65–99)
Glucose-Capillary: 375 mg/dL — ABNORMAL HIGH (ref 65–99)

## 2015-11-06 MED ORDER — METHYLPREDNISOLONE SODIUM SUCC 125 MG IJ SOLR
60.0000 mg | Freq: Two times a day (BID) | INTRAMUSCULAR | Status: DC
  Administered 2015-11-07 – 2015-11-09 (×5): 60 mg via INTRAVENOUS
  Filled 2015-11-06 (×5): qty 2

## 2015-11-06 MED ORDER — ACETAZOLAMIDE SODIUM 500 MG IJ SOLR
250.0000 mg | Freq: Once | INTRAMUSCULAR | Status: AC
  Administered 2015-11-06: 250 mg via INTRAVENOUS
  Filled 2015-11-06: qty 500

## 2015-11-06 MED ORDER — INSULIN ASPART 100 UNIT/ML ~~LOC~~ SOLN
0.0000 [IU] | Freq: Every day | SUBCUTANEOUS | Status: DC
Start: 2015-11-06 — End: 2015-11-09
  Administered 2015-11-06 – 2015-11-08 (×3): 3 [IU] via SUBCUTANEOUS
  Filled 2015-11-06: qty 3
  Filled 2015-11-06: qty 9
  Filled 2015-11-06 (×4): qty 3

## 2015-11-06 MED ORDER — INSULIN ASPART 100 UNIT/ML ~~LOC~~ SOLN
0.0000 [IU] | Freq: Three times a day (TID) | SUBCUTANEOUS | Status: DC
  Administered 2015-11-06 – 2015-11-07 (×2): 5 [IU] via SUBCUTANEOUS
  Administered 2015-11-07: 3 [IU] via SUBCUTANEOUS
  Administered 2015-11-07: 2 [IU] via SUBCUTANEOUS
  Administered 2015-11-08: 5 [IU] via SUBCUTANEOUS
  Administered 2015-11-08: 9 [IU] via SUBCUTANEOUS
  Administered 2015-11-08: 3 [IU] via SUBCUTANEOUS
  Administered 2015-11-09 (×2): 2 [IU] via SUBCUTANEOUS
  Filled 2015-11-06: qty 5
  Filled 2015-11-06: qty 2
  Filled 2015-11-06: qty 3
  Filled 2015-11-06: qty 8
  Filled 2015-11-06: qty 3
  Filled 2015-11-06: qty 5
  Filled 2015-11-06: qty 2

## 2015-11-06 MED ORDER — IPRATROPIUM-ALBUTEROL 0.5-2.5 (3) MG/3ML IN SOLN
3.0000 mL | RESPIRATORY_TRACT | Status: DC
  Administered 2015-11-06 – 2015-11-09 (×11): 3 mL via RESPIRATORY_TRACT
  Filled 2015-11-06 (×11): qty 3

## 2015-11-06 MED ORDER — INSULIN ASPART 100 UNIT/ML ~~LOC~~ SOLN
4.0000 [IU] | Freq: Three times a day (TID) | SUBCUTANEOUS | Status: DC
  Administered 2015-11-06 – 2015-11-08 (×6): 4 [IU] via SUBCUTANEOUS
  Filled 2015-11-06 (×6): qty 4

## 2015-11-06 MED ORDER — MOMETASONE FURO-FORMOTEROL FUM 100-5 MCG/ACT IN AERO
2.0000 | INHALATION_SPRAY | Freq: Two times a day (BID) | RESPIRATORY_TRACT | Status: DC
  Administered 2015-11-06 – 2015-11-09 (×6): 2 via RESPIRATORY_TRACT
  Filled 2015-11-06: qty 8.8

## 2015-11-06 MED ORDER — TIOTROPIUM BROMIDE MONOHYDRATE 18 MCG IN CAPS
18.0000 ug | ORAL_CAPSULE | Freq: Every day | RESPIRATORY_TRACT | Status: DC
  Administered 2015-11-06 – 2015-11-09 (×4): 18 ug via RESPIRATORY_TRACT
  Filled 2015-11-06: qty 5

## 2015-11-06 MED ORDER — FUROSEMIDE 10 MG/ML IJ SOLN
80.0000 mg | Freq: Once | INTRAMUSCULAR | Status: AC
  Administered 2015-11-06: 80 mg via INTRAVENOUS
  Filled 2015-11-06: qty 8

## 2015-11-06 MED ORDER — INSULIN DETEMIR 100 UNIT/ML ~~LOC~~ SOLN
10.0000 [IU] | Freq: Every day | SUBCUTANEOUS | Status: DC
  Administered 2015-11-06 – 2015-11-07 (×2): 10 [IU] via SUBCUTANEOUS
  Filled 2015-11-06 (×4): qty 0.1

## 2015-11-06 MED ORDER — BUDESONIDE 0.5 MG/2ML IN SUSP
0.5000 mg | Freq: Two times a day (BID) | RESPIRATORY_TRACT | Status: DC
  Administered 2015-11-06 – 2015-11-09 (×6): 0.5 mg via RESPIRATORY_TRACT
  Filled 2015-11-06 (×6): qty 2

## 2015-11-06 NOTE — Progress Notes (Signed)
Pt sats 68 percent on 40% high flow O2. Called RT increased O2 to 90% currently between 88 to 94%. Pt with no complaints. Called Dr Leslye Peer made aware. Of pt status.

## 2015-11-06 NOTE — Consult Note (Signed)
Palliative Medicine Inpatient Consult Note   Name: Kaitlin Henderson Date: 11/06/2015 MRN: [id number]  DOB: [birth year]  Referring Physician: Loletha Grayer, MD  Palliative Care consult requested for this 75 y.o. female for goals of medical therapy in patient with respiratory failure and a lung nodule.  Pt is now on Hi Flow oxygen.     BRIEF HISTORY: 75 yo woman with severe emphysema (on 3 LPM oxygen at home chronically) came in on 11/02/15 with shortness of breath for about 1 week prior to admission.  She also has had a cough but no fever or chills.  Sats were in the low 80's in the ER.  A chest xray did not show anything significant except COPD changes.  She had a nonrebreather placed at first and then oxygen was gradually increased. She stated, "I cannot take care of myself anymore'.  She had not been able to get out of bed for days prior to coming in.  She did have some episodes of diarrhea at home.  She was started empirically on Vanco and Zosyn for possible pneumonia, though CXR did not show any obvious infiltrates.   Pt lives at [facility], independent senior apartment living.  She normally uses her electric wheelchair to go to the dining hall for lunch and dinner.  She is on 3 LPM of O2 at home.  Pt is estranged from all family and forbids contact with her children but gave permission to call her boyfriend if needed.  Pt told social worker she wouldn't really want to go to a hospice facility but would like for hospice to come to her at [facility].  She said she has had hospice services previously in [facility].  Pt chose [facility] in talking with Education officer, museum. Pt adamantly refused SNF placement and stated she wanted to return to [facility].    On 3/3, pt was placed on Hi Flow Oxygen due to dyspnea. Sats were 90% on 6 LPM nasal cannula.  She stated she takes Xanax at home and anti-anxiety meds were ordered here. She has not had any diarrhea while here.  On 3/4, she  asked staff if she could call the funeral home --but she wanted to stay a full code. Left lower leg cellulitis was noted and treated with Levaquin.  Later that day, during a discussion with critical care physician, she changed code status to DNR.  PT saw pt and recommended SNF. She was only able to stand briefly before needing to lie back down. Sats dropped to 85% with exertion. She refuses the SCDs and some hygiene care.  ABX were broadened on 3/5 to cover pneumonia. Sugars are high on steroids.    At 8 am, on 3/6, pts sats were 90% on 40% Hi Flow at 68% FIO2.  Soon, she was put on 90% FIO2 Hi Flow. She had been placed empirically on high dose Lovenox to take a possible PE out of the equation.    TODAY'S DISCUSSIONS AND DECISIONS: Background: Pt cannot leave the hospital to go anywhere while requring this much Hi Flow oxygen. She would have to be tapered down to 40% FIO2 and 15 LPM Hi Flow for this to be something that could be provided at [facility] (or a home or anywhere).  Pt has no care-givers as far as I know.  Going to [facility] is impractical as Hospice cannot provide services to someone incapable of caring for herself and also w/o the constant capable care-giving she  would need in a non-hospice staffed setting.   Discussion: I talked openly but kindly to patient and let her know that she is on so much oxygen that she currently cannot go anywhere because it can't be done. But, if we could get it down, then she could go somewhere where there is 24/hr nursing care. She is informed that [facility] is NOT an option.  She says she realizes this. She cried and said her boyfriend just left and that his heart is breaking.   Pt says she would want to go to a hospice home facility BUT she would prefer [facility] b/c all of her friends are in [place].   She does not, however, want [place] to contact either her daughter or son or any of her grandchildren. She says they told her, 'You are on your own  now' and they haven't contacted her in over a year, so she wants them to learn about her death in the obituaries.    She made her funeral plans while here and says 'its all settled'.   She understands that if she cannot come down to no more than 15 LPM/ and no more than 40% FIO2 --that we will be looking at taking her off of the Hi Flow and starting a low dose morphine drip.  She tells me she had Hospice come to her home in [place] (before she moved to [facility]). They came out for 5 yrs according to her. She said, "I am going to prove you wrong!  I am going to come off of this high oxgyen "  She also said she knows this time it is different and she may not 'make it'.    She wants 2-3 days more of treatment to see if she can come down or off the hi flow. This is not unreasonable. I will see her daily.  We will in the meantime need to check with [facility] as to how much Hi Flow oxygen they permit in their facility.   IMPRESSION: COPD with acute exacerbation (Gold stage D) ---Former Smoker (2.5 ppd for 51 yrs --quit 09/02/2006) Right Upper Lobe Lung Nodule (2.1 cm) ---spiculated and pleural based in R upper lobe apex ---suspect lung cancer ---unable to perform diagnosict procedure due to high risk of Pneumothorax/ bleeding/ death due to pulmonary disease Acute on chronic respiratory failure with hypoxia and hypercapnia Breast Cancer  ---right breast s/p right mascectomy, chemo and XRT Depression Hair Loss Obesity CAD w/ H/O MI Apthous Stomatitis DM2 Dyslipidemia Hypothyroidism Essential Tremor GERD Deconditioning    REVIEW OF SYSTEMS:  Patient is not able to provide ROS  In detail --due to being dyspneic  SPIRITUAL SUPPORT SYSTEM: Yes.  SOCIAL HISTORY:  reports that she quit smoking about 9 years ago. Her smoking use included Cigarettes. She has a 135 pack-year smoking history. She has never used smokeless tobacco. She reports that she does not drink alcohol or use illicit  drugs.  Lives at [facility] ([place] apartment living with meals provided in dining hall --but NOT an assisted living facility).   LEGAL DOCUMENTS:  None  CODE STATUS: DNR  PAST MEDICAL HISTORY: Past Medical History  Diagnosis Date  . Breast cancer, right breast ([place]) 1998    s/p chemo & XRT, right mastectomy  . OBESITY   . CORONARY ARTERY DISEASE   . APHTHOUS STOMATITIS   . DEPRESSION     started sertraline 09/2010  . DIABETES, TYPE 2 dx 03/2010  . HYPERLIPIDEMIA   .  Hypothyroidism   . C O P D     chronic O2 3LPM [place]  . Essential tremor   . NSTEMI (non-ST elevated myocardial infarction) ([place]) 01/2012    med mgmt  . Anginal pain ([place])   . GERD (gastroesophageal reflux disease)     PAST SURGICAL HISTORY:  Past Surgical History  Procedure Laterality Date  . Appendectomy    . Cholecystectomy    . Abdominal hysterectomy    . Right mastectomy    . Tonsillectomy    . Mastectomy      ALLERGIES:  is allergic to codeine; levaquin; and sulfonamide derivatives.  MEDICATIONS:  Current Facility-Administered Medications  Medication Dose Route Frequency Provider Last Rate Last Dose  . 0.9 %  sodium chloride infusion  250 mL Intravenous PRN Dustin Flock, MD      . acetaZOLAMIDE (DIAMOX) injection 250 mg  250 mg Intravenous Once Flora Lipps, MD      . ALPRAZolam Duanne Moron) tablet 0.25 mg  0.25 mg Oral TID PRN Henreitta Leber, MD   0.25 mg at 11/05/15 1722  . azithromycin (ZITHROMAX) 500 mg in dextrose 5 % 250 mL IVPB  500 mg Intravenous Q24H Loletha Grayer, MD   500 mg at 11/06/15 1704  . budesonide (PULMICORT) nebulizer solution 0.5 mg  0.5 mg Nebulization BID Flora Lipps, MD      . enoxaparin (LOVENOX) injection 95 mg  1 mg/kg Subcutaneous Q12H Laverle Hobby, MD   95 mg at 11/06/15 1503  . ibuprofen (ADVIL,MOTRIN) tablet 400 mg  400 mg Oral Q8H PRN Henreitta Leber, MD      . insulin aspart (novoLOG) injection 0-5 Units  0-5 Units Subcutaneous QHS Loletha Grayer, MD       . insulin aspart (novoLOG) injection 0-9 Units  0-9 Units Subcutaneous TID WC Loletha Grayer, MD   5 Units at 11/06/15 1702  . insulin aspart (novoLOG) injection 4 Units  4 Units Subcutaneous TID WC Loletha Grayer, MD   4 Units at 11/06/15 1702  . insulin detemir (LEVEMIR) injection 10 Units  10 Units Subcutaneous QHS Loletha Grayer, MD      . ipratropium-albuterol (DUONEB) 0.5-2.5 (3) MG/3ML nebulizer solution 3 mL  3 mL Nebulization Q6H PRN Dustin Flock, MD      . ipratropium-albuterol (DUONEB) 0.5-2.5 (3) MG/3ML nebulizer solution 3 mL  3 mL Nebulization Q4H Flora Lipps, MD      . methylPREDNISolone sodium succinate (SOLU-MEDROL) 125 mg/2 mL injection 60 mg  60 mg Intravenous Q12H Flora Lipps, MD      . mometasone-formoterol (DULERA) 100-5 MCG/ACT inhaler 2 puff  2 puff Inhalation BID Flora Lipps, MD      . ondansetron (ZOFRAN) tablet 4 mg  4 mg Oral Q6H PRN Dustin Flock, MD       Or  . ondansetron (ZOFRAN) injection 4 mg  4 mg Intravenous Q6H PRN Dustin Flock, MD   4 mg at 11/05/15 2147  . pantoprazole (PROTONIX) EC tablet 40 mg  40 mg Oral Daily Dustin Flock, MD   40 mg at 11/06/15 0902  . piperacillin-tazobactam (ZOSYN) IVPB 3.375 g  3.375 g Intravenous 3 times per day Laverle Hobby, MD   3.375 g at 11/06/15 1503  . sodium chloride flush (NS) 0.9 % injection 3 mL  3 mL Intravenous Q12H Dustin Flock, MD   3 mL at 11/05/15 2118  . sodium chloride flush (NS) 0.9 % injection 3 mL  3 mL Intravenous Q12H Dustin Flock, MD   3  mL at 11/06/15 0909  . sodium chloride flush (NS) 0.9 % injection 3 mL  3 mL Intravenous PRN Dustin Flock, MD      . tiotropium (SPIRIVA) inhalation capsule 18 mcg  18 mcg Inhalation Daily Flora Lipps, MD   18 mcg at 11/06/15 1708    Vital Signs: BP 119/70 mmHg  Pulse 102  Temp(Src) 98 F (36.7 C) (Oral)  Resp 22  Ht 5\' 2"  (1.575 m)  Wt 94.892 kg (209 lb 3.2 oz)  BMI 38.25 kg/m2  SpO2 93% Filed Weights   11/02/15 1558 11/02/15 2022 11/05/15  0446  Weight: 97.523 kg (215 lb) 91.989 kg (202 lb 12.8 oz) 94.892 kg (209 lb 3.2 oz)    Estimated body mass index is 38.25 kg/(m^2) as calculated from the following:   Height as of this encounter: 5\' 2"  (1.575 m).   Weight as of this encounter: 94.892 kg (209 lb 3.2 oz).  PERFORMANCE STATUS (ECOG) : 4 - Bedbound  PHYSICAL EXAM: Alert and pleasant --but then tearful and emotionally labile EOMI OP clear Hi Flow in place No JVD seen and no TM Hrt rrr no m Lungs with decreased BS all zones No rales Abd soft and NT Ext no mottling or cyanosis  She is tearful, then determined to get better, then tearful and sad --back and forth.   LABS: CBC:    Component Value Date/Time   WBC 7.7 11/06/2015 0505   HGB 14.3 11/06/2015 0505   HCT 42.1 11/06/2015 0505   PLT 230 11/06/2015 0505   MCV 95.7 11/06/2015 0505   NEUTROABS 5.0 11/02/2015 1602   LYMPHSABS 1.9 11/02/2015 1602   MONOABS 0.6 11/02/2015 1602   EOSABS 0.1 11/02/2015 1602   BASOSABS 0.0 11/02/2015 1602   Comprehensive Metabolic Panel:    Component Value Date/Time   NA 144 11/03/2015 0637   K 4.6 11/03/2015 0637   CL 103 11/03/2015 0637   CO2 32 11/03/2015 0637   BUN 15 11/03/2015 0637   CREATININE 0.91 11/06/2015 0505   GLUCOSE 187* 11/03/2015 0637   CALCIUM 8.7* 11/03/2015 0637   AST 21 11/02/2015 1602   ALT 13* 11/02/2015 1602   ALKPHOS 69 11/02/2015 1602   BILITOT 1.0 11/02/2015 1602   PROT 7.4 11/02/2015 1602   ALBUMIN 3.6 11/02/2015 1602     More than 50% of the visit was spent in counseling/coordination of care: Yes  Time Spent: 80 minutes

## 2015-11-06 NOTE — Progress Notes (Signed)
Patient is currently on high flow oxygen. Clinical Education officer, museum (CSW) discussed case with MD, RN and [employer]. Patient does not want to be placed at a SNF and prefers to return to [facility]. Palliative consult is pending. CSW will continue to follow and assist as needed.   Blima Rich, LCSW [phone]

## 2015-11-06 NOTE — Care Management Note (Signed)
Case Management Note  Patient Details  Name: Kaitlin Henderson MRN: [id number] Date of Birth: [birth year]  Subjective/Objective:     Kaitlin Henderson reported that when she is discharged she wants to go home with Hospice Services. She chose [facility] and [name] from [facility] is aware and is following Kaitlin Henderson's progress.                Action/Plan:   Expected Discharge Date:                  Expected Discharge Plan:     In-House Referral:     Discharge planning Services     Post Acute Care Choice:    Choice offered to:     DME Arranged:    DME Agency:     HH Arranged:    [place] Agency:     Status of Service:     Medicare Important Message Given:  Yes Date Medicare IM Given:    Medicare IM give by:    Date Additional Medicare IM Given:    Additional Medicare Important Message give by:     If discussed at [place] of Stay Meetings, dates discussed:    Additional Comments:  Reichen Hutzler A, RN 11/06/2015, 8:37 AM

## 2015-11-06 NOTE — Progress Notes (Signed)
Patient ID: Kaitlin Henderson, female   DOB: [birth year], 75 y.o.   MRN: [id number] Kaitlin Henderson PROGRESS NOTE  Kaitlin Henderson [id number] DOB: 1941/05/16 DOA: 11/02/2015 PCP: Kaitlin Grant, MD  HPI/Subjective: I was called this morning for respiratory distress. Patient required to go up to 90% FiO2 on the high flow nasal cannula. Patient is complaining of shortness of breath. And cough.  Objective: Filed Vitals:   11/06/15 0321 11/06/15 0802  BP: 153/72 143/91  Pulse: 83 110  Temp: 98.3 F (36.8 C) 97.7 F (36.5 C)  Resp: 18 26    Filed Weights   11/02/15 1558 11/02/15 2022 11/05/15 0446  Weight: 97.523 kg (215 lb) 91.989 kg (202 lb 12.8 oz) 94.892 kg (209 lb 3.2 oz)    ROS: Review of Systems  Constitutional: Negative for fever and chills.  Eyes: Negative for blurred vision.  Respiratory: Positive for cough, shortness of breath and wheezing.   Cardiovascular: Negative for chest pain.  Gastrointestinal: Negative for nausea, vomiting, abdominal pain, diarrhea and constipation.  Genitourinary: Negative for dysuria.  Musculoskeletal: Negative for joint pain.  Neurological: Negative for dizziness and headaches.   Exam: Physical Exam  Constitutional: She is oriented to person, place, and time.  HENT:  Nose: No mucosal edema.  Mouth/Throat: No oropharyngeal exudate or posterior oropharyngeal edema.  Eyes: Conjunctivae, EOM and lids are normal. Pupils are equal, round, and reactive to light.  Neck: No JVD present. Carotid bruit is not present. No edema present. No thyroid mass and no thyromegaly present.  Cardiovascular: S1 normal and S2 normal.  Tachycardia present.  Exam reveals no gallop.   No murmur heard. Pulses:      Dorsalis pedis pulses are 2+ on the right side, and 2+ on the left side.  Respiratory: No respiratory distress. She has decreased breath sounds in the right upper field, the right middle field, the right lower field, the left upper field,  the left middle field and the left lower field. She has wheezes in the right middle field, the right lower field and the left lower field. She has no rhonchi. She has no rales.  GI: Soft. Bowel sounds are normal. There is no tenderness.  Musculoskeletal:       Right ankle: She exhibits swelling.       Left ankle: She exhibits swelling.  Lymphadenopathy:    She has no cervical adenopathy.  Neurological: She is alert and oriented to person, place, and time. No cranial nerve deficit.  Skin: Skin is warm. Nails show no clubbing.  Slight erythema left lower extremity  Psychiatric: She has a normal mood and affect.      Data Reviewed: Basic Metabolic Panel:  Recent Labs Lab 11/02/15 1602 11/03/15 0637 11/04/15 0335 11/06/15 0505  NA 142 144  --   --   K 4.0 4.6  --   --   CL 99* 103  --   --   CO2 34* 32  --   --   GLUCOSE 138* 187*  --   --   BUN 15 15  --   --   CREATININE 0.97 1.04* 0.92 0.91  CALCIUM 8.9 8.7*  --   --   MG 2.1  --   --   --    Liver Function Tests:  Recent Labs Lab 11/02/15 1602  AST 21  ALT 13*  ALKPHOS 69  BILITOT 1.0  PROT 7.4  ALBUMIN 3.6   CBC:  Recent Labs Lab 11/02/15 1602 11/03/15  [id number] 11/06/15 0505  WBC 7.8 5.5 7.7  NEUTROABS 5.0  --   --   HGB 15.8 15.9 14.3  HCT 46.9 47.1* 42.1  MCV 95.8 98.0 95.7  PLT 281 261 230   CBG:  Recent Labs Lab 11/05/15 0820 11/05/15 1118 11/05/15 1559 11/05/15 2118 11/06/15 0800  GLUCAP 220* 291* 346* 317* 259*    Recent Results (from the past 240 hour(s))  Urine culture     Status: None (Preliminary result)   Collection Time: 11/02/15  4:01 PM  Result Value Ref Range Status   Specimen Description URINE, CLEAN CATCH  Final   Special Requests Normal  Final   Culture HOLDING FOR POSSIBLE PATHOGEN  Final   Report Status PENDING  Incomplete  Blood Culture (routine x 2)     Status: None (Preliminary result)   Collection Time: 11/02/15  5:53 PM  Result Value Ref Range Status   Specimen  Description BLOOD LEFT ASSIST CONTROL  Final   Special Requests   Final    BOTTLES DRAWN AEROBIC AND ANAEROBIC  4CC AERO, 5CC ANA   Culture NO GROWTH 4 DAYS  Final   Report Status PENDING  Incomplete  Blood Culture (routine x 2)     Status: None (Preliminary result)   Collection Time: 11/02/15  5:55 PM  Result Value Ref Range Status   Specimen Description BLOOD LEFT HAND  Final   Special Requests   Final    BOTTLES DRAWN AEROBIC AND ANAEROBIC  6CC AERO, 3CC ANA   Culture NO GROWTH 4 DAYS  Final   Report Status PENDING  Incomplete  MRSA PCR Screening     Status: None   Collection Time: 11/05/15  3:55 PM  Result Value Ref Range Status   MRSA by PCR NEGATIVE NEGATIVE Final    Comment:        The GeneXpert MRSA Assay (FDA approved for NASAL specimens only), is one component of a comprehensive MRSA colonization surveillance program. It is not intended to diagnose MRSA infection nor to guide or monitor treatment for MRSA infections.   Gastrointestinal Panel by PCR , Stool     Status: None   Collection Time: 11/05/15  5:18 PM  Result Value Ref Range Status   Campylobacter species NOT DETECTED NOT DETECTED Final   Plesimonas shigelloides NOT DETECTED NOT DETECTED Final   Salmonella species NOT DETECTED NOT DETECTED Final   Yersinia enterocolitica NOT DETECTED NOT DETECTED Final   Vibrio species NOT DETECTED NOT DETECTED Final   Vibrio cholerae NOT DETECTED NOT DETECTED Final   Enteroaggregative E coli (EAEC) NOT DETECTED NOT DETECTED Final   Enteropathogenic E coli (EPEC) NOT DETECTED NOT DETECTED Final   Enterotoxigenic E coli (ETEC) NOT DETECTED NOT DETECTED Final   Shiga like toxin producing E coli (STEC) NOT DETECTED NOT DETECTED Final   E. coli O157 NOT DETECTED NOT DETECTED Final   Shigella/Enteroinvasive E coli (EIEC) NOT DETECTED NOT DETECTED Final   Cryptosporidium NOT DETECTED NOT DETECTED Final   Cyclospora cayetanensis NOT DETECTED NOT DETECTED Final   Entamoeba  histolytica NOT DETECTED NOT DETECTED Final   Giardia lamblia NOT DETECTED NOT DETECTED Final   Adenovirus F40/41 NOT DETECTED NOT DETECTED Final   Astrovirus NOT DETECTED NOT DETECTED Final   Norovirus GI/GII NOT DETECTED NOT DETECTED Final   Rotavirus A NOT DETECTED NOT DETECTED Final   Sapovirus (I, II, IV, and V) NOT DETECTED NOT DETECTED Final      Scheduled Meds: . azithromycin  500 mg Intravenous Q24H  . budesonide (PULMICORT) nebulizer solution  0.25 mg Nebulization BID  . enoxaparin (LOVENOX) injection  1 mg/kg Subcutaneous Q12H  . furosemide  80 mg Intravenous Once  . insulin aspart  0-9 Units Subcutaneous TID WC  . ipratropium-albuterol  3 mL Nebulization TID  . methylPREDNISolone (SOLU-MEDROL) injection  60 mg Intravenous Q6H  . pantoprazole  40 mg Oral Daily  . piperacillin-tazobactam (ZOSYN)  IV  3.375 g Intravenous 3 times per day  . sodium chloride flush  3 mL Intravenous Q12H  . sodium chloride flush  3 mL Intravenous Q12H  . vancomycin  750 mg Intravenous Q18H    Assessment/Plan:  1. Acute on chronic respiratory failure. Patient currently on high flow nasal cannula 90% FiO2. Patient is currently a DO NOT RESUSCITATE. I will repeat a chest x-ray. Give 1 dose of Lasix 80 mg IV 1. Overall prognosis is poor. Patient refused me to speak with family. Palliative care consultation placed. 2. COPD exacerbation continue high-dose steroids and nebulizer treatments continue budesonide. Patient is on triple antibiotics with vancomycin and Zosyn and Zithromax.  3. 2.1 cm spiculated lung nodule. Right now patient is in no condition to have a lung biopsy. 4. Type 2 diabetes- on sliding scale. Sugars will be high on steroids. 5. Anxiety on Xanax 6. Gastroesophageal reflux disease without esophagitis on Protonix 7. Previous diarrhea- no further diarrhea at this point 8. Weakness- likely will need rehabilitation 9. Left lower extremity cellulitis continue antibiotics  10.  patient  placed on high-dose Lovenox to take pulmonary embolism out of the equation.  Code Status: DO NOT RESUSCITATE    Code Status Orders         Code Status History    Date Active Date Inactive Code Status Order ID Comments User Context   01/17/2012  8:32 PM 01/28/2012  4:49 PM DNR [id number]  Mila Palmer, RN Inpatient     Family Communication: Patient did not want me to contact her daughter Disposition Plan: To be determined  Antibiotics:  Vancomycin  Zosyn  Zithromax  Time spent:   25 minutes, overall prognosis is poor.   Loletha Grayer  [facility]

## 2015-11-06 NOTE — Progress Notes (Signed)
Inpatient Diabetes Program Recommendations  AACE/ADA: New Consensus Statement on Inpatient Glycemic Control (2015)  Target Ranges:  Prepandial:   less than 140 mg/dL      Peak postprandial:   less than 180 mg/dL (1-2 hours)      Critically ill patients:  140 - 180 mg/dL  Results for Kaitlin Henderson, Kaitlin Henderson (MRN [id number]) as of 11/06/2015 08:53  Ref. Range 11/05/2015 08:20 11/05/2015 11:18 11/05/2015 15:59 11/05/2015 21:18 11/06/2015 08:00  Glucose-Capillary Latest Ref Range: 65-99 mg/dL 220 (H) 291 (H) 346 (H) 317 (H) 259 (H)   Review of Glycemic Control  Diabetes history: DM2 Outpatient Diabetes medications: Metformin XR 500 mg BID Current orders for Inpatient glycemic control: Novolog 0-9 units TID with meals  Inpatient Diabetes Program Recommendations: Insulin - Basal: If steroids are continued as ordered, please consider ordering Levemir 10 units Q24H (based on 94.8 kg x 0.1 units). Correction (SSI): Please consider adding Novolog BEDTIME correction scale. Insulin - Meal Coverage: If steroids are continued as ordered, please consider ordering Novolog 4 units TID with meals for meal coverage.  Thanks, Barnie Alderman, RN, MSN, CDE Diabetes Coordinator Inpatient Diabetes Program [phone] (Team Pager from [name] to [place]) [phone] (AP office) [phone] [facility] office) [phone] [facility] office)

## 2015-11-06 NOTE — Consult Note (Signed)
[facility] Consultation      Assessment and Plan:  75 year old woman with severe emphysema, now with acute exacerbation of COPD, and possible pulmonary edema. Incidental finding of right upper lobe nodule.  Acute exacerbation of COPD. -Continue IV steroids,on zosyn,vanc,azithromycin -Severe baseline COPD, dependent on 2 L of oxygen, currently on 6 L of oxygen. Wean down oxygen as tolerated. -increased frequency of dounebs, started spiriva and dulera, cont pulmicort nebs  Right upper lobe lung nodule. -Given  poor functional status, with debility, deconditioning, she would appear to be a poor candidate for chemotherapy or surgical resection. In addition, given the area of the lung nodule. She would be at high risk of developing pneumothorax, or bleeding complications. She is also at high risk of death, given the severity of her baseline pulmonary disease, therefore, we discussed that it may be best to hold off on any potential diagnostic procedures at this time. She expressed her agreement, expressed that she does not want a biopsy at this time. We can reevaluate this in the future once her status improves.  Debility/deconditioning. -May benefit from physical therapy.  Acute hypoxic and hypercapnic respiratory failure.? Pulmonary edema. -As above secondary to COPD exacerbation, with severe baseline COPD. Continue nebulized medications and steroids. -No improvement with empiric diuresis, started  empiric Lovenox -MRSA screen is negative-will consider stopping vancomycin -check ECHO, may need to consider diuretic therapy with diamox  CODE STATUS: DO NOT RESUSCITATE. - that she would like to be DO NOT RESUSCITATE.  Palliative care consulted   Date: 11/06/2015  MRN# [id number] Kaitlin Henderson March 11, 1941  Referring Physician:   [name] is a 75 y.o. old female seen in consultation for chief complaint of:    Chief Complaint  Patient presents with  .  Shortness of Breath    HPI:   Remains SOB, slight increased WOB On high flow [place] at fio2 65% Patient is DNR/DNI  Allergies:  Codeine; Levaquin; and Sulfonamide derivatives  Review of Systems: Gen:  Denies  fever, sweats, chills HEENT: Denies blurred vision, double vision. Cvc:  No dizziness, chest pain. Resp:   Denies cough or sputum. +SOB Gi: Denies swallowing difficulty, stomach pain. Other:  All other systems were reviewed with the patient and were negative other that what is mentioned in the HPI.   Physical Examination:   VS: BP 143/91 mmHg  Pulse 110  Temp(Src) 97.7 F (36.5 C) (Oral)  Resp 26  Ht 5\' 2"  (1.575 m)  Wt 209 lb 3.2 oz (94.892 kg)  BMI 38.25 kg/m2  SpO2 93%  General Appearance: No distress  Neuro:without focal findings,  speech normal,  HEENT: PERRLA, EOM intact.   Pulmonary: Decreased breath sounds bilaterally, with scattered wheezing. CardiovascularNormal S1,S2.  No m/r/g.   Abdomen: Benign, Soft, non-tender.  Other findings:    LABORATORY PANEL:   CBC  Recent Labs Lab 11/06/15 0505  WBC 7.7  HGB 14.3  HCT 42.1  PLT 230   ------------------------------------------------------------------------------------------------------------------  Chemistries   Recent Labs Lab 11/02/15 1602 11/03/15 0637  11/06/15 0505  NA 142 144  --   --   K 4.0 4.6  --   --   CL 99* 103  --   --   CO2 34* 32  --   --   GLUCOSE 138* 187*  --   --   BUN 15 15  --   --   CREATININE 0.97 1.04*  < > 0.91  CALCIUM 8.9 8.7*  --   --  MG 2.1  --   --   --   AST 21  --   --   --   ALT 13*  --   --   --   ALKPHOS 69  --   --   --   BILITOT 1.0  --   --   --   < > = values in this interval not displayed. ------------------------------------------------------------------------------------------------------------------  Cardiac Enzymes  Recent Labs Lab 11/02/15 1602  TROPONINI 0.03    ------------------------------------------------------------  RADIOLOGY:  Dg Chest 1 View  11/06/2015  CLINICAL DATA:  Increased shortness of breath, cough. EXAM: CHEST 1 VIEW COMPARISON:  November 03, 2015. FINDINGS: The heart size and mediastinal contours are within normal limits. Both lungs are clear. Right axillary surgical clips are noted. No pneumothorax or pleural effusion is noted. The visualized skeletal structures are unremarkable. IMPRESSION: No acute cardiopulmonary abnormality seen. Electronically Signed   By: Marijo Conception, M.D.   On: 11/06/2015 09:28     I have personally obtained a history, examined the patient, evaluated Pertinent laboratory and RadioGraphic/imaging results, and  formulated the assessment and plan   The Patient requires high complexity decision making for assessment and support, frequent evaluation and titration of therapies. Patient  satisfied with Plan of action and management. All questions answered  Corrin Parker, M.D.  [facility]  Medical Director [place] Director [facility] Cardio-Pulmonary Department

## 2015-11-06 NOTE — Progress Notes (Signed)
PT Cancellation Note  Patient Details Name: Kaitlin Henderson MRN: [id number] DOB: December 10, 1940   Cancelled Treatment:    Reason Eval/Treat Not Completed: Other (comment). After chart review attempted to call nursing to discuss level of appropriateness, as palliative consult pending and increased need of high flow O2. Unable to reach/find nurse at the time. Spoke with pt briefly who notes she is not interested in participating with any level of PT. May need to complete current PT orders.    Charlaine Dalton, [place] 11/06/2015, 2:56 PM

## 2015-11-07 ENCOUNTER — Inpatient Hospital Stay (HOSPITAL_COMMUNITY)
Admit: 2015-11-07 | Discharge: 2015-11-07 | Disposition: A | Discharge status: Home / Self Care | Payer: Medicare Other | Attending: Internal Medicine | Admitting: Internal Medicine

## 2015-11-07 ENCOUNTER — Inpatient Hospital Stay: Payer: Medicare Other

## 2015-11-07 DIAGNOSIS — R0600 Dyspnea, unspecified: Secondary | ICD-10-CM

## 2015-11-07 DIAGNOSIS — J441 Chronic obstructive pulmonary disease with (acute) exacerbation: Secondary | ICD-10-CM | POA: Insufficient documentation

## 2015-11-07 DIAGNOSIS — J9601 Acute respiratory failure with hypoxia: Secondary | ICD-10-CM

## 2015-11-07 LAB — GLUCOSE, CAPILLARY
GLUCOSE-CAPILLARY: 194 mg/dL — AB (ref 65–99)
GLUCOSE-CAPILLARY: 203 mg/dL — AB (ref 65–99)
Glucose-Capillary: 251 mg/dL — ABNORMAL HIGH (ref 65–99)
Glucose-Capillary: 261 mg/dL — ABNORMAL HIGH (ref 65–99)

## 2015-11-07 LAB — CULTURE, BLOOD (ROUTINE X 2)
CULTURE: NO GROWTH
CULTURE: NO GROWTH

## 2015-11-07 MED ORDER — MAGIC MOUTHWASH
10.0000 mL | Freq: Four times a day (QID) | ORAL | Status: DC | PRN
  Filled 2015-11-07: qty 10

## 2015-11-07 NOTE — Progress Notes (Signed)
*  PRELIMINARY RESULTS* Echocardiogram 2D Echocardiogram has been performed.  Kaitlin Henderson 11/07/2015, 2:15 PM

## 2015-11-07 NOTE — Progress Notes (Signed)
Per Dr. Hale Bogus note patient requested [facility] in [place]. Clinical Education officer, museum (CSW) contacted [employer] this morning and is waiting on a return call. CSW will continue to follow and assist as needed.   Blima Rich, LCSW [phone]

## 2015-11-07 NOTE — Progress Notes (Signed)
* [facility]     Assessment and Plan:  75 year old woman with severe emphysema, now with severe acute hypoxic respiratory failure. Possible acute exacerbation of COPD, and possible pulmonary edema. Incidental finding of right upper lobe nodule.  Acute exacerbation of COPD. -Continue IV steroids,on zosyn,,azithromycin -Severe baseline COPD, dependent on 2 L of oxygen, currently on high flow oxygen at 60% FiO2. Wean down oxygen as tolerated. -Continue spiriva and dulera, cont nebs  Right upper lobe lung nodule. -Given poor functional status, with debility, deconditioning, and severe respiratory failure--No interventions planned at this time. Patient expresses that she does not want a biopsy.  Debility/deconditioning. -May benefit from physical therapy.  Acute hypoxic and hypercapnic respiratory failure -As above secondary to COPD exacerbation, with severe baseline COPD.  -Continue empiric antibiotics, steroids, anticoagulation. -MRSA screen is negative-vancomycin discontinued. -Echo results pending.  Continue current management for the next day or 2, if no improvement, may need to consider enrollment in hospice. Given her current oxygen requirements. She would not be a candidate for hospice in an outpatient setting.  CODE STATUS: DO NOT RESUSCITATE. Palliative care consulted and following.   Date: 11/07/2015  MRN# [id number] Kaitlin Henderson 1940-12-08   Kaitlin Henderson is a 23 y.o. old female seen in follow up for chief complaint of  Chief Complaint  Patient presents with  . Shortness of Breath     HPI:   The patient continues to be short of breath, she has no new complaints today.  Allergies:  Codeine; Levaquin; and Sulfonamide derivatives  Review of Systems: Gen:  Denies  fever, sweats. HEENT: Denies blurred vision. Cvc:  No dizziness, chest pain or heaviness Resp:   Denies cough or sputum porduction. Gi: Denies swallowing difficulty Gu:   Denies bladder incontinence, burning urine Ext:   No Joint pain, stiffness. Skin: No skin rash, easy bruising. Endoc:  No polyuria, polydipsia. Psych: No depression, insomnia. Other:  All other systems were reviewed and found to be negative other than what is mentioned in the HPI.   Physical Examination:   VS: BP 106/59 mmHg  Pulse 86  Temp(Src) 98.4 F (36.9 C) (Oral)  Resp 18  Ht 5\' 2"  (1.575 m)  Wt 196 lb 3.4 oz (89 kg)  BMI 35.88 kg/m2  SpO2 90%  General Appearance: No distress  Neuro:without focal findings,  speech normal,  HEENT: PERRLA, EOM intact. Pulmonary: normal breath sounds, decreased air entry bilaterally.  CardiovascularNormal S1,S2.  No m/r/g.   Abdomen: Benign, Soft, non-tender. Renal:  No costovertebral tenderness  GU:  Not performed at this time. Endoc: No evident thyromegaly, no signs of acromegaly. Skin:   warm, no rash. Extremities: normal, no cyanosis, clubbing.   LABORATORY PANEL:   CBC  Recent Labs Lab 11/06/15 0505  WBC 7.7  HGB 14.3  HCT 42.1  PLT 230   ------------------------------------------------------------------------------------------------------------------  Chemistries   Recent Labs Lab 11/02/15 1602 11/03/15 0637  11/06/15 0505  NA 142 144  --   --   K 4.0 4.6  --   --   CL 99* 103  --   --   CO2 34* 32  --   --   GLUCOSE 138* 187*  --   --   BUN 15 15  --   --   CREATININE 0.97 1.04*  < > 0.91  CALCIUM 8.9 8.7*  --   --   MG 2.1  --   --   --   AST 21  --   --   --  ALT 13*  --   --   --   ALKPHOS 69  --   --   --   BILITOT 1.0  --   --   --   < > = values in this interval not displayed. ------------------------------------------------------------------------------------------------------------------  Cardiac Enzymes  Recent Labs Lab 11/02/15 1602  TROPONINI 0.03   ------------------------------------------------------------  RADIOLOGY:   No results found for this or any previous visit. Results for  orders placed during the hospital encounter of 09/13/13  DG Chest 2 View   Narrative CLINICAL DATA:  Shortness of breath  EXAM: CHEST  2 VIEW  COMPARISON:  DG CHEST 2 VIEW dated 01/23/2012  FINDINGS: The lungs are hyperinflated likely secondary to COPD. There is no focal parenchymal opacity, pleural effusion, or pneumothorax. The heart and mediastinal contours are unremarkable.  There is mild degenerative disc disease of the thoracic spine.  IMPRESSION: No active cardiopulmonary disease.   Electronically Signed   By: Kathreen Devoid   On: 09/13/2013 15:54    ------------------------------------------------------------------------------------------------------------------  Thank  you for allowing [facility] to assist in the care of your patient. Our recommendations are noted above.  Please contact us if we can be of further service.   Marda Stalker, MD.  [facility]  Patricia Pesa, M.D.  Vilinda Boehringer, M.D.  Merton Border, M.D

## 2015-11-07 NOTE — Progress Notes (Signed)
Dr. Leslye Peer notified patient complaining of mouth hurting. Mouth is red. Dr. Leslye Peer telephone ordered dukes mouthwash 10 cc q6h.  Dr. Leslye Peer stated it was fine to have patient on 6L of O2 with sats > or equal to 87%.

## 2015-11-07 NOTE — Progress Notes (Signed)
Patient ID: Kaitlin Henderson, female   DOB: [birth year], 75 y.o.   MRN: [id number] [facility] PROGRESS NOTE  Kaitlin Henderson [id number] DOB: 19-Oct-1940 DOA: 11/02/2015 PCP: Gwendolyn Grant, MD  HPI/Subjective: Patient still short of breath and coughing and wheezing. She states that she doesn't want to die and she wants to stay around longer. She is hesitant about hospice home.  Objective: Filed Vitals:   11/07/15 0736 11/07/15 0739  BP: 106/59   Pulse: 86 86  Temp: 98.4 F (36.9 C)   Resp: 18     Filed Weights   11/02/15 2022 11/05/15 0446 11/07/15 0431  Weight: 91.989 kg (202 lb 12.8 oz) 94.892 kg (209 lb 3.2 oz) 89 kg (196 lb 3.4 oz)    ROS: Review of Systems  Constitutional: Negative for fever and chills.  Eyes: Negative for blurred vision.  Respiratory: Positive for cough, shortness of breath and wheezing.   Cardiovascular: Negative for chest pain.  Gastrointestinal: Negative for nausea, vomiting, abdominal pain, diarrhea and constipation.  Genitourinary: Negative for dysuria.  Musculoskeletal: Negative for joint pain.  Neurological: Negative for dizziness and headaches.   Exam: Physical Exam  Constitutional: She is oriented to person, place, and time.  HENT:  Nose: No mucosal edema.  Mouth/Throat: No oropharyngeal exudate or posterior oropharyngeal edema.  Eyes: Conjunctivae, EOM and lids are normal. Pupils are equal, round, and reactive to light.  Neck: No JVD present. Carotid bruit is not present. No edema present. No thyroid mass and no thyromegaly present.  Cardiovascular: S1 normal and S2 normal.  Tachycardia present.  Exam reveals no gallop.   No murmur heard. Pulses:      Dorsalis pedis pulses are 2+ on the right side, and 2+ on the left side.  Respiratory: No respiratory distress. She has decreased breath sounds in the right upper field, the right middle field, the right lower field, the left upper field, the left middle field and the left  lower field. She has wheezes in the right middle field, the right lower field and the left lower field. She has no rhonchi. She has no rales.  GI: Soft. Bowel sounds are normal. There is no tenderness.  Musculoskeletal:       Right ankle: She exhibits swelling.       Left ankle: She exhibits swelling.  Lymphadenopathy:    She has no cervical adenopathy.  Neurological: She is alert and oriented to person, place, and time. No cranial nerve deficit.  Skin: Skin is warm. Nails show no clubbing.  Slight erythema left lower extremity  Psychiatric: She has a normal mood and affect.      Data Reviewed: Basic Metabolic Panel:  Recent Labs Lab 11/02/15 1602 11/03/15 0637 11/04/15 0335 11/06/15 0505  NA 142 144  --   --   K 4.0 4.6  --   --   CL 99* 103  --   --   CO2 34* 32  --   --   GLUCOSE 138* 187*  --   --   BUN 15 15  --   --   CREATININE 0.97 1.04* 0.92 0.91  CALCIUM 8.9 8.7*  --   --   MG 2.1  --   --   --    Liver Function Tests:  Recent Labs Lab 11/02/15 1602  AST 21  ALT 13*  ALKPHOS 69  BILITOT 1.0  PROT 7.4  ALBUMIN 3.6   CBC:  Recent Labs Lab 11/02/15 1602 11/03/15 0637 11/06/15  0505  WBC 7.8 5.5 7.7  NEUTROABS 5.0  --   --   HGB 15.8 15.9 14.3  HCT 46.9 47.1* 42.1  MCV 95.8 98.0 95.7  PLT 281 261 230   CBG:  Recent Labs Lab 11/06/15 1129 11/06/15 1548 11/06/15 2053 11/07/15 0826 11/07/15 1140  GLUCAP 375* 287* 276* 194* 203*    Recent Results (from the past 240 hour(s))  Urine culture     Status: None   Collection Time: 11/02/15  4:01 PM  Result Value Ref Range Status   Specimen Description URINE, CLEAN CATCH  Final   Special Requests Normal  Final   Culture MULTIPLE SPECIES PRESENT, SUGGEST RECOLLECTION  Final   Report Status 11/06/2015 FINAL  Final  Blood Culture (routine x 2)     Status: None   Collection Time: 11/02/15  5:53 PM  Result Value Ref Range Status   Specimen Description BLOOD LEFT ASSIST CONTROL  Final   Special  Requests   Final    BOTTLES DRAWN AEROBIC AND ANAEROBIC  4CC AERO, 5CC ANA   Culture NO GROWTH 5 DAYS  Final   Report Status 11/07/2015 FINAL  Final  Blood Culture (routine x 2)     Status: None   Collection Time: 11/02/15  5:55 PM  Result Value Ref Range Status   Specimen Description BLOOD LEFT HAND  Final   Special Requests   Final    BOTTLES DRAWN AEROBIC AND ANAEROBIC  6CC AERO, 3CC ANA   Culture NO GROWTH 5 DAYS  Final   Report Status 11/07/2015 FINAL  Final  MRSA PCR Screening     Status: None   Collection Time: 11/05/15  3:55 PM  Result Value Ref Range Status   MRSA by PCR NEGATIVE NEGATIVE Final    Comment:        The GeneXpert MRSA Assay (FDA approved for NASAL specimens only), is one component of a comprehensive MRSA colonization surveillance program. It is not intended to diagnose MRSA infection nor to guide or monitor treatment for MRSA infections.   Gastrointestinal Panel by PCR , Stool     Status: None   Collection Time: 11/05/15  5:18 PM  Result Value Ref Range Status   Campylobacter species NOT DETECTED NOT DETECTED Final   Plesimonas shigelloides NOT DETECTED NOT DETECTED Final   Salmonella species NOT DETECTED NOT DETECTED Final   Yersinia enterocolitica NOT DETECTED NOT DETECTED Final   Vibrio species NOT DETECTED NOT DETECTED Final   Vibrio cholerae NOT DETECTED NOT DETECTED Final   Enteroaggregative E coli (EAEC) NOT DETECTED NOT DETECTED Final   Enteropathogenic E coli (EPEC) NOT DETECTED NOT DETECTED Final   Enterotoxigenic E coli (ETEC) NOT DETECTED NOT DETECTED Final   Shiga like toxin producing E coli (STEC) NOT DETECTED NOT DETECTED Final   E. coli O157 NOT DETECTED NOT DETECTED Final   Shigella/Enteroinvasive E coli (EIEC) NOT DETECTED NOT DETECTED Final   Cryptosporidium NOT DETECTED NOT DETECTED Final   Cyclospora cayetanensis NOT DETECTED NOT DETECTED Final   Entamoeba histolytica NOT DETECTED NOT DETECTED Final   Giardia lamblia NOT  DETECTED NOT DETECTED Final   Adenovirus F40/41 NOT DETECTED NOT DETECTED Final   Astrovirus NOT DETECTED NOT DETECTED Final   Norovirus GI/GII NOT DETECTED NOT DETECTED Final   Rotavirus A NOT DETECTED NOT DETECTED Final   Sapovirus (I, II, IV, and V) NOT DETECTED NOT DETECTED Final      Scheduled Meds: . azithromycin  500 mg Intravenous Q24H  .  budesonide (PULMICORT) nebulizer solution  0.5 mg Nebulization BID  . enoxaparin (LOVENOX) injection  1 mg/kg Subcutaneous Q12H  . insulin aspart  0-5 Units Subcutaneous QHS  . insulin aspart  0-9 Units Subcutaneous TID WC  . insulin aspart  4 Units Subcutaneous TID WC  . insulin detemir  10 Units Subcutaneous QHS  . ipratropium-albuterol  3 mL Nebulization Q4H  . methylPREDNISolone (SOLU-MEDROL) injection  60 mg Intravenous Q12H  . mometasone-formoterol  2 puff Inhalation BID  . pantoprazole  40 mg Oral Daily  . piperacillin-tazobactam (ZOSYN)  IV  3.375 g Intravenous 3 times per day  . sodium chloride flush  3 mL Intravenous Q12H  . sodium chloride flush  3 mL Intravenous Q12H  . tiotropium  18 mcg Inhalation Daily    Assessment/Plan:  1. Acute on chronic respiratory failure. Patient currently on high flow nasal cannula 66% FiO2. Patient is currently a DO NOT RESUSCITATE. Repeat chest x-ray yesterday was negative. Echocardiogram still pending patient received 1 dose of Lasix yesterday. In 10 use steroids and antibiotics. 2. COPD exacerbation continue high-dose steroids and nebulizer treatments continue budesonide. Patient is on triple antibiotics with vancomycin and Zosyn and Zithromax. Patient lost IV access and a PICC line was ordered 3. 2.1 cm spiculated lung nodule. Right now patient is in no condition to have a lung biopsy. 4. Type 2 diabetes- on sliding scale. Sugars will be high on steroids. 5. Anxiety on Xanax 6. Gastroesophageal reflux disease without esophagitis on Protonix 7. Previous diarrhea- no further diarrhea at this  point 8. Weakness 9. Left lower extremity cellulitis continue antibiotics  10. patient placed on high-dose Lovenox to take pulmonary embolism out of the equation. 11. Patient understands that she may need nursing home care. She is hesitant on hospice home at this point. She wants to give it more time to get better.  Code Status: DO NOT RESUSCITATE    Code Status Orders         Code Status History    Date Active Date Inactive Code Status Order ID Comments User Context   01/17/2012  8:32 PM 01/28/2012  4:49 PM DNR [id number]  Mila Palmer, RN Inpatient     Family Communication: Patient did not want me to contact her daughter Disposition Plan: To be determined  Antibiotics:  Vancomycin  Zosyn  Zithromax  Time spent:   25 minutes, overall prognosis is poor.   Loletha Grayer  [facility]

## 2015-11-07 NOTE — Care Management Note (Signed)
Case Management Note  Patient Details  Name: Kaitlin Henderson MRN: [id number] Date of Birth: 1941-03-02  Subjective/Objective:       Current tentative discharge plan is to discharge to a hospice facility. She is currently on high flow oxygen therapy which is preventing a hospice facility admission. Dr Megan Salon from [facility] is following for discharge disposition.            Action/Plan:   Expected Discharge Date:                  Expected Discharge Plan:     In-House Referral:     Discharge planning Services     Post Acute Care Choice:    Choice offered to:     DME Arranged:    DME Agency:     HH Arranged:    [place] Agency:     Status of Service:     Medicare Important Message Given:  Yes Date Medicare IM Given:    Medicare IM give by:    Date Additional Medicare IM Given:    Additional Medicare Important Message give by:     If discussed at [place] of Stay Meetings, dates discussed:    Additional Comments:  Nichola Warren A, RN 11/07/2015, 4:13 PM

## 2015-11-07 NOTE — Progress Notes (Signed)
Erling Conte called Clinical Social Worker ([place]) back from [facility]. Per [facility] has no beds right now but that may change in the next 24 hours. Per [place] can take patient's on 15 Liters of regular oxygen. [facility] is reviewing referral. Dr. Megan Salon is aware of above. CSW will continue to follow and assist as needed.   Blima Rich, LCSW [phone]

## 2015-11-07 NOTE — Progress Notes (Addendum)
Palliative Medicine Inpatient Consult Follow Up Note   Name: Kaitlin Henderson Date: 11/07/2015 MRN: [id number]  DOB: [birth year]  Referring Physician: Loletha Grayer, MD  Palliative Care consult requested for this 75 y.o. female for goals of medical therapy in patient with end stage COPD on hi Flow O2.  TODAY'S DEVELOPMENTS/ DISCUSSIONS 1.  Pt is NOT making progess so far in terms of a reduction of the Hi Flow O2 needs.  I have talked with RT and they tried to reduce this in the morning but it was not possible.  She is needing as much as 70% and 65 or more liters.     2.  Hospice Home can provide 35% / 15 LPM (I had previously said 40%/ 15 LPM)  3.  [facility] in [place] does not accomodate Hi Flow O2 (they can do 15 LPM of 'regular flow O2)  4.  Pt would like 'more time' to see if she can come off of the Hi Flow.  This is reasonable --but the likelihood of this happening is questionable.  5.  There is NO IN-HOUSE HOSPICE option here at [facility] currently.  Pt could be compassionately terminally withdrawn from Hi Flow here and allowed to pass away (but there is no GIP / in house Hospice option as has been brought up).  6.  She will change her mind about many things if asked her ideas. She has been a bit forgetful about some things we have talked about so we need to focus her attention each time we talk with her.   7.  I have talked with RT about trying to taper her off of Hi Flow if possible.      IMPRESSION: COPD with acute exacerbation (Gold stage D) ---Former Smoker (2.5 ppd for 54 yrs --quit 09/02/2006) Right Upper Lobe Lung Nodule (2.1 cm) ---spiculated and pleural based in R upper lobe apex ---suspect lung cancer ---unable to perform diagnosict procedure due to high risk of Pneumothorax/ bleeding/ death due to pulmonary disease Acute on chronic respiratory failure with hypoxia and hypercapnia Breast Cancer  ---right breast s/p right mascectomy, chemo and XRT Depression Hair  Loss Obesity CAD w/ H/O MI Apthous Stomatitis DM2 Dyslipidemia Hypothyroidism Essential Tremor GERD Deconditioning    CODE STATUS: DNR   PAST MEDICAL HISTORY: Past Medical History  Diagnosis Date  . Breast cancer, right breast ([place]) 1998    s/p chemo & XRT, right mastectomy  . OBESITY   . CORONARY ARTERY DISEASE   . APHTHOUS STOMATITIS   . DEPRESSION     started sertraline 09/2010  . DIABETES, TYPE 2 dx 03/2010  . HYPERLIPIDEMIA   . Hypothyroidism   . C O P D     chronic O2 3LPM [place]  . Essential tremor   . NSTEMI (non-ST elevated myocardial infarction) ([place]) 01/2012    med mgmt  . Anginal pain ([place])   . GERD (gastroesophageal reflux disease)     PAST SURGICAL HISTORY:  Past Surgical History  Procedure Laterality Date  . Appendectomy    . Cholecystectomy    . Abdominal hysterectomy    . Right mastectomy    . Tonsillectomy    . Mastectomy      Vital Signs: BP 106/59 mmHg  Pulse 86  Temp(Src) 98.4 F (36.9 C) (Oral)  Resp 18  Ht 5\' 2"  (1.575 m)  Wt 89 kg (196 lb 3.4 oz)  BMI 35.88 kg/m2  SpO2 97% Filed Weights   11/02/15 2022 11/05/15 0446 11/07/15  0431  Weight: 91.989 kg (202 lb 12.8 oz) 94.892 kg (209 lb 3.2 oz) 89 kg (196 lb 3.4 oz)    Estimated body mass index is 35.88 kg/(m^2) as calculated from the following:   Height as of this encounter: 5\' 2"  (1.575 m).   Weight as of this encounter: 89 kg (196 lb 3.4 oz).  PHYSICAL EXAM: Sleeping with Hi Flow O2 in place in medical bed EOMI OP clear No JVD or TM Hrt rrr no m Lungs with very decreased BS Abd soft and NT Ext no cyanosis or mottling   LABS: CBC:    Component Value Date/Time   WBC 7.7 11/06/2015 0505   HGB 14.3 11/06/2015 0505   HCT 42.1 11/06/2015 0505   PLT 230 11/06/2015 0505   MCV 95.7 11/06/2015 0505   NEUTROABS 5.0 11/02/2015 1602   LYMPHSABS 1.9 11/02/2015 1602   MONOABS 0.6 11/02/2015 1602   EOSABS 0.1 11/02/2015 1602   BASOSABS 0.0 11/02/2015 1602   Comprehensive  Metabolic Panel:    Component Value Date/Time   NA 144 11/03/2015 0637   K 4.6 11/03/2015 0637   CL 103 11/03/2015 0637   CO2 32 11/03/2015 0637   BUN 15 11/03/2015 0637   CREATININE 0.91 11/06/2015 0505   GLUCOSE 187* 11/03/2015 0637   CALCIUM 8.7* 11/03/2015 0637   AST 21 11/02/2015 1602   ALT 13* 11/02/2015 1602   ALKPHOS 69 11/02/2015 1602   BILITOT 1.0 11/02/2015 1602   PROT 7.4 11/02/2015 1602   ALBUMIN 3.6 11/02/2015 1602    More than 50% of the visit was spent in counseling/coordination of care: YES  Time Spent:  35 min

## 2015-11-08 DIAGNOSIS — J9601 Acute respiratory failure with hypoxia: Secondary | ICD-10-CM

## 2015-11-08 LAB — GLUCOSE, CAPILLARY
GLUCOSE-CAPILLARY: 233 mg/dL — AB (ref 65–99)
Glucose-Capillary: 380 mg/dL — ABNORMAL HIGH (ref 65–99)
Glucose-Capillary: 200 mg/dL — ABNORMAL HIGH (ref 65–99)
Glucose-Capillary: 295 mg/dL — ABNORMAL HIGH (ref 65–99)

## 2015-11-08 MED ORDER — DEXTROSE 5 % IV SOLN
500.0000 mg | INTRAVENOUS | Status: DC
  Administered 2015-11-08: 500 mg via INTRAVENOUS
  Filled 2015-11-08 (×2): qty 500

## 2015-11-08 MED ORDER — INSULIN DETEMIR 100 UNIT/ML ~~LOC~~ SOLN
13.0000 [IU] | Freq: Every day | SUBCUTANEOUS | Status: DC
  Administered 2015-11-08: 13 [IU] via SUBCUTANEOUS
  Filled 2015-11-08 (×2): qty 0.13

## 2015-11-08 MED ORDER — ENOXAPARIN SODIUM 40 MG/0.4ML ~~LOC~~ SOLN
40.0000 mg | SUBCUTANEOUS | Status: DC
  Administered 2015-11-09: 40 mg via SUBCUTANEOUS
  Filled 2015-11-08: qty 0.4

## 2015-11-08 MED ORDER — POLYETHYLENE GLYCOL 3350 17 G PO PACK
17.0000 g | PACK | Freq: Every day | ORAL | Status: DC
  Administered 2015-11-09: 17 g via ORAL
  Filled 2015-11-08 (×2): qty 1

## 2015-11-08 MED ORDER — INSULIN ASPART 100 UNIT/ML ~~LOC~~ SOLN
7.0000 [IU] | Freq: Three times a day (TID) | SUBCUTANEOUS | Status: DC
  Administered 2015-11-08 – 2015-11-09 (×3): 7 [IU] via SUBCUTANEOUS
  Filled 2015-11-08 (×3): qty 7

## 2015-11-08 NOTE — Progress Notes (Signed)
* Kaitlin Henderson     Assessment and Plan:  75 year old woman with severe emphysema, now with severe acute hypoxic respiratory failure. Possible acute exacerbation of COPD, and possible pulmonary edema. Incidental finding of right upper lobe nodule.  Acute exacerbation of COPD Her rest her status is improved, O2 sat is currently 90% on 6 L nasal cannula, which is down from high flow nasal cannula. -Can change steroids to an oral prednisone taper, with favorable longer taper. -Severe baseline COPD continue to wean down oxygen as tolerated. -Continue spiriva and dulera, cont nebs  Right upper lobe lung nodule. -Given poor functional status, with debility, deconditioning, and severe respiratory failure--No interventions planned at this time. Patient expresses that she does not want a biopsy.  Debility/deconditioning. -May benefit from physical therapy.  Acute hypoxic and hypercapnic respiratory failure -As above secondary to COPD exacerbation, with severe baseline COPD.  -MRSA screen is negative-vancomycin discontinued. -Can likely change antibiotics to orals such as Augmentin and azithromycin. -Would recommend changing Lovenox to Xarelto and can continue outpatient. The patient was started on anticoagulation empirically, which can continue as above.   CODE STATUS: DO NOT RESUSCITATE. Palliative care consulted and following. The patient is very anxious and impulsive and finds it very difficult to make decisions about her care due to severe anxiety. She often changes her mind multiple times in the same conversation in regards to her wishes. We'll likely need to do for her decisions to her power of attorney in the future.  The patient can be discharged to skilled nursing facility from respiratory standpoint. Pulmonary service will sign off at this time, please call if there are any further questions or concerns.   Date: 11/08/2015  MRN# [id number] Kaitlin Henderson  [birth year]   Kaitlin Henderson is a 75 y.o. old female seen in follow up for chief complaint of  Chief Complaint  Patient presents with  . Shortness of Breath     HPI:   The patient continues to be short of breath, she has no new complaints today.  Allergies:  Codeine; Levaquin; and Sulfonamide derivatives  Review of Systems: Gen:  Denies  fever, sweats. HEENT: Denies blurred vision. Cvc:  No dizziness, chest pain or heaviness Resp:   Denies cough or sputum porduction. Gi: Denies swallowing difficulty Gu:  Denies bladder incontinence, burning urine Ext:   No Joint pain, stiffness. Skin: No skin rash, easy bruising. Endoc:  No polyuria, polydipsia. Psych: No depression, insomnia. Other:  All other systems were reviewed and found to be negative other than what is mentioned in the HPI.   Physical Examination:   VS: BP 134/73 mmHg  Pulse 94  Temp(Src) 97.5 F (36.4 C) (Oral)  Resp 22  Ht 5\' 2"  (1.575 m)  Wt 196 lb 3.4 oz (89 kg)  BMI 35.88 kg/m2  SpO2 88%  General Appearance: No distress  Neuro:without focal findings,  speech normal,  HEENT: PERRLA, EOM intact. Pulmonary: normal breath sounds, decreased air entry bilaterally.  CardiovascularNormal S1,S2.  No m/r/g.   Abdomen: Benign, Soft, non-tender. Renal:  No costovertebral tenderness  GU:  Not performed at this time. Endoc: No evident thyromegaly, no signs of acromegaly. Skin:   warm, no rash. Extremities: normal, no cyanosis, clubbing.   LABORATORY PANEL:   CBC  Recent Labs Lab 11/06/15 0505  WBC 7.7  HGB 14.3  HCT 42.1  PLT 230   ------------------------------------------------------------------------------------------------------------------  Chemistries   Recent Labs Lab 11/02/15 1602 11/03/15 [id number]  11/06/15 0505  NA 142 144  --   --   K 4.0 4.6  --   --   CL 99* 103  --   --   CO2 34* 32  --   --   GLUCOSE 138* 187*  --   --   BUN 15 15  --   --   CREATININE 0.97 1.04*  < > 0.91    CALCIUM 8.9 8.7*  --   --   MG 2.1  --   --   --   AST 21  --   --   --   ALT 13*  --   --   --   ALKPHOS 69  --   --   --   BILITOT 1.0  --   --   --   < > = values in this interval not displayed. ------------------------------------------------------------------------------------------------------------------  Cardiac Enzymes  Recent Labs Lab 11/02/15 1602  TROPONINI 0.03   ------------------------------------------------------------  RADIOLOGY:   No results found for this or any previous visit. Results for orders placed during the hospital encounter of 09/13/13  DG Chest 2 View   Narrative CLINICAL DATA:  Shortness of breath  EXAM: CHEST  2 VIEW  COMPARISON:  DG CHEST 2 VIEW dated 01/23/2012  FINDINGS: The lungs are hyperinflated likely secondary to COPD. There is no focal parenchymal opacity, pleural effusion, or pneumothorax. The heart and mediastinal contours are unremarkable.  There is mild degenerative disc disease of the thoracic spine.  IMPRESSION: No active cardiopulmonary disease.   Electronically Signed   By: Kathreen Devoid   On: 09/13/2013 15:54    ------------------------------------------------------------------------------------------------------------------  Thank  you for allowing [facility] to assist in the care of your patient. Our recommendations are noted above.  Please contact us if we can be of further service.   Marda Stalker, MD.  [facility]  Patricia Pesa, M.D.  Vilinda Boehringer, M.D.  Merton Border, M.D

## 2015-11-08 NOTE — Progress Notes (Signed)
Patient ID: Kaitlin Henderson, female   DOB: 1940/09/12, 75 y.o.   MRN: [id number]  [facility] PROGRESS NOTE  Kaitlin Henderson [id number] DOB: Feb 17, 1941 DOA: 11/02/2015 PCP: Gwendolyn Grant, MD  HPI/Subjective: Patient feeling a little bit better. Still with shortness of breath cough and wheeze. Patient does not want to go to the hospice home.  Objective: Filed Vitals:   11/08/15 1300 11/08/15 1315  BP:    Pulse:    Temp:    Resp: 20 22    Filed Weights   11/02/15 2022 11/05/15 0446 11/07/15 0431  Weight: 91.989 kg (202 lb 12.8 oz) 94.892 kg (209 lb 3.2 oz) 89 kg (196 lb 3.4 oz)    ROS: Review of Systems  Constitutional: Negative for fever and chills.  Eyes: Negative for blurred vision.  Respiratory: Positive for cough, shortness of breath and wheezing.   Cardiovascular: Negative for chest pain.  Gastrointestinal: Negative for nausea, vomiting, abdominal pain, diarrhea and constipation.  Genitourinary: Negative for dysuria.  Musculoskeletal: Negative for joint pain.  Neurological: Negative for dizziness and headaches.   Exam: Physical Exam  Constitutional: She is oriented to person, place, and time.  HENT:  Nose: No mucosal edema.  Mouth/Throat: No oropharyngeal exudate or posterior oropharyngeal edema.  Eyes: Conjunctivae, EOM and lids are normal. Pupils are equal, round, and reactive to light.  Neck: No JVD present. Carotid bruit is not present. No edema present. No thyroid mass and no thyromegaly present.  Cardiovascular: S1 normal and S2 normal.  Tachycardia present.  Exam reveals no gallop.   No murmur heard. Pulses:      Dorsalis pedis pulses are 2+ on the right side, and 2+ on the left side.  Respiratory: No respiratory distress. She has decreased breath sounds in the right middle field, the right lower field, the left middle field and the left lower field. She has wheezes in the right lower field and the left lower field. She has no rhonchi. She  has no rales.  GI: Soft. Bowel sounds are normal. There is no tenderness.  Musculoskeletal:       Right ankle: She exhibits swelling.       Left ankle: She exhibits swelling.  Lymphadenopathy:    She has no cervical adenopathy.  Neurological: She is alert and oriented to person, place, and time. No cranial nerve deficit.  Skin: Skin is warm. Nails show no clubbing.  Slight erythema left lower extremity  Psychiatric: She has a normal mood and affect.      Data Reviewed: Basic Metabolic Panel:  Recent Labs Lab 11/02/15 1602 11/03/15 0637 11/04/15 0335 11/06/15 0505  NA 142 144  --   --   K 4.0 4.6  --   --   CL 99* 103  --   --   CO2 34* 32  --   --   GLUCOSE 138* 187*  --   --   BUN 15 15  --   --   CREATININE 0.97 1.04* 0.92 0.91  CALCIUM 8.9 8.7*  --   --   MG 2.1  --   --   --    Liver Function Tests:  Recent Labs Lab 11/02/15 1602  AST 21  ALT 13*  ALKPHOS 69  BILITOT 1.0  PROT 7.4  ALBUMIN 3.6   CBC:  Recent Labs Lab 11/02/15 1602 11/03/15 0637 11/06/15 0505  WBC 7.8 5.5 7.7  NEUTROABS 5.0  --   --   HGB 15.8 15.9 14.3  HCT 46.9 47.1* 42.1  MCV 95.8 98.0 95.7  PLT 281 261 230   CBG:  Recent Labs Lab 11/07/15 1140 11/07/15 1628 11/07/15 2130 11/08/15 0733 11/08/15 1133  GLUCAP 203* 251* 261* 233* 380*    Recent Results (from the past 240 hour(s))  Urine culture     Status: None   Collection Time: 11/02/15  4:01 PM  Result Value Ref Range Status   Specimen Description URINE, CLEAN CATCH  Final   Special Requests Normal  Final   Culture MULTIPLE SPECIES PRESENT, SUGGEST RECOLLECTION  Final   Report Status 11/06/2015 FINAL  Final  Blood Culture (routine x 2)     Status: None   Collection Time: 11/02/15  5:53 PM  Result Value Ref Range Status   Specimen Description BLOOD LEFT ASSIST CONTROL  Final   Special Requests   Final    BOTTLES DRAWN AEROBIC AND ANAEROBIC  4CC AERO, 5CC ANA   Culture NO GROWTH 5 DAYS  Final   Report Status  11/07/2015 FINAL  Final  Blood Culture (routine x 2)     Status: None   Collection Time: 11/02/15  5:55 PM  Result Value Ref Range Status   Specimen Description BLOOD LEFT HAND  Final   Special Requests   Final    BOTTLES DRAWN AEROBIC AND ANAEROBIC  6CC AERO, 3CC ANA   Culture NO GROWTH 5 DAYS  Final   Report Status 11/07/2015 FINAL  Final  MRSA PCR Screening     Status: None   Collection Time: 11/05/15  3:55 PM  Result Value Ref Range Status   MRSA by PCR NEGATIVE NEGATIVE Final    Comment:        The GeneXpert MRSA Assay (FDA approved for NASAL specimens only), is one component of a comprehensive MRSA colonization surveillance program. It is not intended to diagnose MRSA infection nor to guide or monitor treatment for MRSA infections.   Gastrointestinal Panel by PCR , Stool     Status: None   Collection Time: 11/05/15  5:18 PM  Result Value Ref Range Status   Campylobacter species NOT DETECTED NOT DETECTED Final   Plesimonas shigelloides NOT DETECTED NOT DETECTED Final   Salmonella species NOT DETECTED NOT DETECTED Final   Yersinia enterocolitica NOT DETECTED NOT DETECTED Final   Vibrio species NOT DETECTED NOT DETECTED Final   Vibrio cholerae NOT DETECTED NOT DETECTED Final   Enteroaggregative E coli (EAEC) NOT DETECTED NOT DETECTED Final   Enteropathogenic E coli (EPEC) NOT DETECTED NOT DETECTED Final   Enterotoxigenic E coli (ETEC) NOT DETECTED NOT DETECTED Final   Shiga like toxin producing E coli (STEC) NOT DETECTED NOT DETECTED Final   E. coli O157 NOT DETECTED NOT DETECTED Final   Shigella/Enteroinvasive E coli (EIEC) NOT DETECTED NOT DETECTED Final   Cryptosporidium NOT DETECTED NOT DETECTED Final   Cyclospora cayetanensis NOT DETECTED NOT DETECTED Final   Entamoeba histolytica NOT DETECTED NOT DETECTED Final   Giardia lamblia NOT DETECTED NOT DETECTED Final   Adenovirus F40/41 NOT DETECTED NOT DETECTED Final   Astrovirus NOT DETECTED NOT DETECTED Final    Norovirus GI/GII NOT DETECTED NOT DETECTED Final   Rotavirus A NOT DETECTED NOT DETECTED Final   Sapovirus (I, II, IV, and V) NOT DETECTED NOT DETECTED Final      Scheduled Meds: . azithromycin  500 mg Intravenous Q24H  . budesonide (PULMICORT) nebulizer solution  0.5 mg Nebulization BID  . insulin aspart  0-5 Units Subcutaneous QHS  .  insulin aspart  0-9 Units Subcutaneous TID WC  . insulin aspart  7 Units Subcutaneous TID WC  . insulin detemir  13 Units Subcutaneous QHS  . ipratropium-albuterol  3 mL Nebulization Q4H  . methylPREDNISolone (SOLU-MEDROL) injection  60 mg Intravenous Q12H  . mometasone-formoterol  2 puff Inhalation BID  . pantoprazole  40 mg Oral Daily  . piperacillin-tazobactam (ZOSYN)  IV  3.375 g Intravenous 3 times per day  . polyethylene glycol  17 g Oral Daily  . tiotropium  18 mcg Inhalation Daily    Assessment/Plan:  1. Acute on chronic respiratory failure. Patient switched to nasal cannula 6 L. Patient is currently a DO NOT RESUSCITATE. 2. COPD exacerbation continue Solu-Medrol decreased to 60 mg IV every 12 hours and nebulizer treatments continue budesonide. Patient is on triple antibiotics with vancomycin and Zosyn and Zithromax. Patient lost IV access and a PICC line was placed yesterday 3. 2.1 cm spiculated lung nodule. Right now patient is in no condition to have a lung biopsy. 4. Type 2 diabetes- on sliding scale. Sugars will be high on steroids. Levemir increased to 13 units at night with 7 units prior to meals 5. Anxiety on Xanax 6. Gastroesophageal reflux disease without esophagitis on Protonix 7. Weakness- patient wants to go to rehabilitation and not the hospice home 8. Left lower extremity cellulitis continue antibiotics    Code Status: DO NOT RESUSCITATE    Code Status Orders         Code Status History    Date Active Date Inactive Code Status Order ID Comments User Context   01/17/2012  8:32 PM 01/28/2012  4:49 PM DNR [id number]  Mila Palmer, RN Inpatient     Family Communication: Patient did not want me to contact her daughter Disposition Plan: Patient refuses hospice home and once to go to rehabilitation.  Antibiotics:  Vancomycin  Zosyn  Zithromax  Time spent:   24 minutes, overall prognosis is poor.   Loletha Grayer  [facility]

## 2015-11-08 NOTE — Progress Notes (Signed)
Pt was DNR status, but now states she would prefer to be full code.  Kaitlin Henderson [facility] 11/08/2015, 10:32 PM

## 2015-11-08 NOTE — Care Management Important Message (Signed)
Important Message  Patient Details  Name: ISHANA FOSNAUGH MRN: [id number] Date of Birth: [birth year]   Medicare Important Message Given:  Yes    Marshell Garfinkel, RN 11/08/2015, 8:41 AM

## 2015-11-08 NOTE — Progress Notes (Signed)
CSW reviewed patient's chart. Patient has been removed from [place] to 6 L O2 nasal cannula. Spoke to MD [employer] and he reports that patient is interested in [facility]. CSW left a voicemail for [facility]- admissions coordinator with [facility] to determine the maximum O2 a patient can discharge on to their facility. Awaiting phone call back. CSW will continue to follow and assist.   Ernest Pine, MSW, [place] Work Department [phone]

## 2015-11-08 NOTE — Care Management (Addendum)
Patient is currently on 6 liters O2 according to medical record. [facility] did not have beds yesterday but potentially today. I will follow up with CSW.  Update: patient now does not want to go to hospice home. She wants to go to [facility] with palliative. She is currently on 6 liters O2.

## 2015-11-08 NOTE — Progress Notes (Signed)
PT Cancellation Note  Patient Details Name: Kaitlin Henderson MRN: [id number] DOB: 07/21/1941   Cancelled Treatment:    Reason Eval/Treat Not Completed: Medical issues which prohibited therapy (Per chart review, patient now DNR and planning for discharge to hospice facility if able to wean from hi-flow O2.  Not appropriate for PT at this time; will complete initial order.  Please reconsult should needs or goals of care change.)   Abednego Yeates H. Owens Shark, PT, DPT, NCS 11/08/2015, 9:16 AM [phone]

## 2015-11-08 NOTE — Progress Notes (Signed)
Inpatient Diabetes Program Recommendations  AACE/ADA: New Consensus Statement on Inpatient Glycemic Control (2015)  Target Ranges:  Prepandial:   less than 140 mg/dL      Peak postprandial:   less than 180 mg/dL (1-2 hours)      Critically ill patients:  140 - 180 mg/dL  Results for Kaitlin Henderson, Kaitlin Henderson (MRN [id number]) as of 11/08/2015 13:09  Ref. Range 11/07/2015 08:26 11/07/2015 11:40 11/07/2015 16:28 11/07/2015 21:30 11/08/2015 07:33 11/08/2015 11:33  Glucose-Capillary Latest Ref Range: 65-99 mg/dL 194 (H) 203 (H) 251 (H) 261 (H) 233 (H) 380 (H)   Review of Glycemic Control  Diabetes history: DM2 Outpatient Diabetes medications: Metformin XR 500 mg BID Current orders for Inpatient glycemic control: Levemir 10 units QHS, Novolog 0-9 units TID with meals, Novolog 0-5 units QHS, Novolog 4 units TID with meals for meal coverage  Inpatient Diabetes Program Recommendations: Insulin - Basal: Please consider increasing Levemir to 13 units QHS (based on 89 kg x 0.15 units). Correction (SSI): Please consider adding Novolog BEDTIME correction scale. Insulin - Meal Coverage: If steroids are continued as ordered (Solumedrol 60 mg Q12H), please consider increasing Novolog meal coverage to Novolog 7 units TID with meals.  Thanks, Barnie Alderman, RN, MSN, CDE Diabetes Coordinator Inpatient Diabetes Program [phone] (Team Pager from [place] to [place]) [phone] ([place] office) [phone] [facility] office) [phone] [facility] office)

## 2015-11-08 NOTE — Evaluation (Signed)
Physical Therapy Evaluation Patient Details Name: Kaitlin Henderson MRN: [id number] DOB: December 16, 1940 Today's Date: 11/08/2015   History of Present Illness  presented to ER from ALF secondary to SOB, cough x1 week; admitted with acute hypoxic respiratory failure secondary to COPD exacerbation.  PMH significant for COPD, breast CA (R), CAD, DM, NSTEMI.    Clinical Impression  Upon evaluation, patient alert and oriented to basic information; intermittently confused to situation at times.  Tearful at times, but responds well to encouragement/attention from therapist. Bilat UE/LEs globally weak and deconditioned, but grossly [facility] for basic transfers and gait.  Able to complete bed mobility with mod indep; sit/stand, basic transfers with RW, cga/min assist.  Notably SOB, fatigued with minimal exertion (desat to 86% on 6L); unable to tolerate additional activity at this time. Did encourage OOB to chair and use of BSC (vs. Bedpan) to promote additional mobility/activity tolerance as appropriate; patient voiced understanding. Would benefit from skilled PT to address above deficits and promote optimal return to PLOF; recommend transition to STR upon discharge from acute hospitalization if able/agreeable to actively participate/progress with progressive mobility training.      Follow Up Recommendations SNF    Equipment Recommendations       Recommendations for Other Services       Precautions / Restrictions Precautions Precautions: Fall Precaution Comments: isolation, enteric Restrictions Weight Bearing Restrictions: No      Mobility  Bed Mobility Overal bed mobility: Modified Independent Bed Mobility: Supine to Sit;Sit to Supine     Supine to sit: Modified independent (Device/Increase time) Sit to supine: Modified independent (Device/Increase time)   General bed mobility comments: use of bedrails to assist  Transfers Overall transfer level: Needs assistance Equipment used: Rolling  walker (2 wheeled) Transfers: Sit to/from Stand Sit to Stand: Supervision         General transfer comment: increased time/effort required to complete; broad BOS  Ambulation/Gait             General Gait Details: patient refused  Stairs            Wheelchair Mobility    Modified [name] (Stroke Patients Only)       Balance Overall balance assessment: Needs assistance Sitting-balance support: No upper extremity supported;Feet supported Sitting balance-Leahy Scale: Good     Standing balance support: Bilateral upper extremity supported Standing balance-Leahy Scale: Fair                               Pertinent Vitals/Pain Pain Assessment: No/denies pain    Home Living Family/patient expects to be discharged to:: Assisted living               Home Equipment: Wheelchair - power Additional Comments: Ambulates in small apartment holding furniture/walls, too small to use w/c in apt.    Prior Function Level of Independence: Needs assistance   Gait / Transfers Assistance Needed: electric w/c     Comments: meals in dining room     Hand Dominance        Extremity/Trunk Assessment   Upper Extremity Assessment: Generalized weakness           Lower Extremity Assessment: Generalized weakness (grossly 3/5 throughout; L LE reddened (cellulitic?))         Communication   Communication: No difficulties  Cognition Arousal/Alertness: Awake/alert Behavior During Therapy: Flat affect (tearful at times, depressed affect) Overall Cognitive Status:  (intermittently confused)  General Comments      Exercises        Assessment/Plan    PT Assessment Patient needs continued PT services  PT Diagnosis Difficulty walking;Generalized weakness   PT Problem List Decreased strength;Decreased activity tolerance;Decreased mobility;Cardiopulmonary status limiting activity;Decreased balance  PT Treatment  Interventions Functional mobility training;Gait training;Therapeutic activities;Therapeutic exercise;Patient/family education;DME instruction;Balance training   PT Goals (Current goals can be found in the Care Plan section) Acute Rehab PT Goals Patient Stated Goal: "I want to go to [facility]" PT Goal Formulation: With patient Time For Goal Achievement: 11/18/15 Potential to Achieve Goals: Fair    Frequency Min 2X/week   Barriers to discharge Decreased caregiver support      Co-evaluation               End of Session Equipment Utilized During Treatment: Oxygen Activity Tolerance: Patient limited by fatigue Patient left: in bed;with call bell/phone within reach;with bed alarm set Nurse Communication: Mobility status         Time: [id number] PT Time Calculation (min) (ACUTE ONLY): 23 min   Charges:   PT Evaluation $PT Re-evaluation: 1 Procedure     PT G Codes:        Kamauri Kathol H. Owens Shark, PT, DPT, NCS 11/08/2015, 3:48 PM [phone]

## 2015-11-08 NOTE — Progress Notes (Signed)
Palliative Medicine Inpatient Consult Follow Up Note   Name: Kaitlin Henderson Date: 11/08/2015 MRN: [id number]  DOB: Aug 25, 1941  Referring Physician: Loletha Grayer, MD  Palliative Care consult requested for this 75 y.o. female for goals of medical therapy in patient with acute on chronic respiratory failure.   TODAY'S PLAN: Pt was able to get off of HI Flow yesterday with lower oxygen sats set as goal (87%). She was on 6 LPM and has been down to 5 LPM this am.  Sats are also improved. Pt no longer wants a hospice facility.  She wants to go to [facility] today if possible (though she had adamantly refused SNF earlier in her hospital stay and we were also not certain that she could come off of the Hi Flow oxygen --though she did!).  PT to see pt again now that plan is SNF and not Hospice.    Will sign off at this time.    PLEASE PUT 'PALLIATIVE CARE CONSULT REQUESTED AT FACILITY'  In Discharge note and FL2 if possible.  I will pass along request for palliative care consult there to Hospice Liaison also.   IMPRESSION: COPD with acute exacerbation (Gold stage D) ---Former Smoker (2.5 ppd for 30 yrs --quit 09/02/2006) Right Upper Lobe Lung Nodule (2.1 cm) ---spiculated and pleural based in R upper lobe apex ---suspect lung cancer ---unable to perform diagnosict procedure due to high risk of Pneumothorax/ bleeding/ death due to pulmonary disease Acute on chronic respiratory failure with hypoxia and hypercapnia Breast Cancer  ---right breast s/p right mascectomy, chemo and XRT Depression Hair Loss Obesity CAD w/ H/O MI Apthous Stomatitis DM2 Dyslipidemia Hypothyroidism Essential Tremor GERD Deconditioning     CODE STATUS: DNR   PAST MEDICAL HISTORY: Past Medical History  Diagnosis Date  . Breast cancer, right breast ([place]) 1998    s/p chemo & XRT, right mastectomy  . OBESITY   . CORONARY ARTERY DISEASE   . APHTHOUS STOMATITIS   . DEPRESSION     started sertraline  09/2010  . DIABETES, TYPE 2 dx 03/2010  . HYPERLIPIDEMIA   . Hypothyroidism   . C O P D     chronic O2 3LPM [place]  . Essential tremor   . NSTEMI (non-ST elevated myocardial infarction) ([place]) 01/2012    med mgmt  . Anginal pain ([place])   . GERD (gastroesophageal reflux disease)     PAST SURGICAL HISTORY:  Past Surgical History  Procedure Laterality Date  . Appendectomy    . Cholecystectomy    . Abdominal hysterectomy    . Right mastectomy    . Tonsillectomy    . Mastectomy      Vital Signs: BP 134/73 mmHg  Pulse 94  Temp(Src) 97.5 F (36.4 C) (Oral)  Resp 18  Ht 5\' 2"  (1.575 m)  Wt 89 kg (196 lb 3.4 oz)  BMI 35.88 kg/m2  SpO2 90% Filed Weights   11/02/15 2022 11/05/15 0446 11/07/15 0431  Weight: 91.989 kg (202 lb 12.8 oz) 94.892 kg (209 lb 3.2 oz) 89 kg (196 lb 3.4 oz)    Estimated body mass index is 35.88 kg/(m^2) as calculated from the following:   Height as of this encounter: 5\' 2"  (1.575 m).   Weight as of this encounter: 89 kg (196 lb 3.4 oz).  PHYSICAL EXAM: NAD Breathing is wnl on 5-6 LPM oxygen via [place] eomi Op clear No JVD or TM Hrt rrr no  m Lungs with some ronchi but otw clear Abd  soft and nt Ext no cyanosis or mottling  LABS: CBC:    Component Value Date/Time   WBC 7.7 11/06/2015 0505   HGB 14.3 11/06/2015 0505   HCT 42.1 11/06/2015 0505   PLT 230 11/06/2015 0505   MCV 95.7 11/06/2015 0505   NEUTROABS 5.0 11/02/2015 1602   LYMPHSABS 1.9 11/02/2015 1602   MONOABS 0.6 11/02/2015 1602   EOSABS 0.1 11/02/2015 1602   BASOSABS 0.0 11/02/2015 1602   Comprehensive Metabolic Panel:    Component Value Date/Time   NA 144 11/03/2015 0637   K 4.6 11/03/2015 0637   CL 103 11/03/2015 0637   CO2 32 11/03/2015 0637   BUN 15 11/03/2015 0637   CREATININE 0.91 11/06/2015 0505   GLUCOSE 187* 11/03/2015 0637   CALCIUM 8.7* 11/03/2015 0637   AST 21 11/02/2015 1602   ALT 13* 11/02/2015 1602   ALKPHOS 69 11/02/2015 1602   BILITOT 1.0 11/02/2015 1602   PROT  7.4 11/02/2015 1602   ALBUMIN 3.6 11/02/2015 1602    More than 50% of the visit was spent in counseling/coordination of care: YES  Time Spent: 15 min

## 2015-11-08 NOTE — Progress Notes (Signed)
Pt requesting code status to be changed to Full. Dr Jannifer Franklin notified

## 2015-11-09 LAB — CREATININE, SERUM
CREATININE: 0.98 mg/dL (ref 0.44–1.00)
GFR, EST NON AFRICAN AMERICAN: 55 mL/min — AB (ref >60)

## 2015-11-09 LAB — CBC AND DIFFERENTIAL
HEMATOCRIT: 44 % (ref 36–46)
Hemoglobin: 15.3 g/dL (ref 12.0–16.0)
Platelets: 228 10*3/uL (ref 150–399)
WBC: 10.5 10^3/mL

## 2015-11-09 LAB — CBC
HEMATOCRIT: 44.3 % (ref 35.0–47.0)
HEMOGLOBIN: 15.3 g/dL (ref 12.0–16.0)
MCH: 32.8 pg (ref 26.0–34.0)
MCHC: 34.5 g/dL (ref 32.0–36.0)
MCV: 95.2 fL (ref 80.0–100.0)
Platelets: 228 10*3/uL (ref 150–440)
RBC: 4.65 MIL/uL (ref 3.80–5.20)
RDW: 13.3 % (ref 11.5–14.5)
WBC: 10.5 10*3/uL (ref 3.6–11.0)

## 2015-11-09 LAB — GLUCOSE, CAPILLARY
GLUCOSE-CAPILLARY: 172 mg/dL — AB (ref 65–99)
GLUCOSE-CAPILLARY: 153 mg/dL — AB (ref 65–99)

## 2015-11-09 MED ORDER — POLYETHYLENE GLYCOL 3350 17 G PO PACK
17.0000 g | PACK | Freq: Every day | ORAL | Status: DC | PRN
Start: 2015-11-09 — End: 2015-12-12

## 2015-11-09 MED ORDER — ALBUTEROL SULFATE (2.5 MG/3ML) 0.083% IN NEBU: 2.5000 mg | INHALATION_SOLUTION | Freq: Four times a day (QID) | RESPIRATORY_TRACT | Status: DC | PRN

## 2015-11-09 MED ORDER — BUDESONIDE 0.5 MG/2ML IN SUSP: 0.5000 mg | Freq: Two times a day (BID) | RESPIRATORY_TRACT | Status: DC

## 2015-11-09 MED ORDER — DOXYCYCLINE HYCLATE 100 MG PO TABS
100.0000 mg | ORAL_TABLET | Freq: Two times a day (BID) | ORAL | Status: DC
Start: 2015-11-09 — End: 2015-11-09
  Administered 2015-11-09: 100 mg via ORAL
  Filled 2015-11-09: qty 1

## 2015-11-09 MED ORDER — DOXYCYCLINE HYCLATE 100 MG PO TABS: 100.0000 mg | ORAL_TABLET | Freq: Two times a day (BID) | ORAL | Status: DC

## 2015-11-09 MED ORDER — ALPRAZOLAM 0.25 MG PO TABS: 0.2500 mg | ORAL_TABLET | Freq: Three times a day (TID) | ORAL | Status: DC | PRN

## 2015-11-09 MED ORDER — TIOTROPIUM BROMIDE MONOHYDRATE 18 MCG IN CAPS: 18.0000 ug | ORAL_CAPSULE | Freq: Every day | RESPIRATORY_TRACT | Status: DC

## 2015-11-09 MED ORDER — PREDNISONE 5 MG PO TABS: ORAL_TABLET | ORAL | Status: DC

## 2015-11-09 MED ORDER — PREDNISONE 20 MG PO TABS
40.0000 mg | ORAL_TABLET | Freq: Every day | ORAL | Status: DC
  Administered 2015-11-09: 40 mg via ORAL
  Filled 2015-11-09: qty 2

## 2015-11-09 MED ORDER — INSULIN ASPART 100 UNIT/ML ~~LOC~~ SOLN: 7.0000 [IU] | Freq: Three times a day (TID) | SUBCUTANEOUS | Status: DC

## 2015-11-09 MED ORDER — INSULIN DETEMIR 100 UNIT/ML ~~LOC~~ SOLN: 15.0000 [IU] | Freq: Every day | SUBCUTANEOUS | Status: DC

## 2015-11-09 NOTE — Progress Notes (Signed)
Clinical Education officer, museum (CSW) met with patient and provided bed offers. She chose [facility] in [place]. Patient is medially stable for D/C today. Per [place] admissions coordinator patient will go to room 109. Per Lexine Baton they can manage 5 liters of oxygen. RN will call report and arrange EMS for transport. CSW sent D/C Summary, FL2 and D/C Packet to [place] via [place]. Patient is aware of above. Patient gave CSW permission to call her boyfriend Randall Hiss and make him aware of above. CSW attempted to call Randall Hiss however he did not answer and a voicemail was left. Please reconsult if future social work needs arise. CSW signing off.

## 2015-11-09 NOTE — Care Management (Signed)
I met with patient who was tearful about being discharged- she "just wants one more day". I explained that treatment now could be achieved in the outpatient setting and Medicare may not pay for continued hospital stay. She agrees to going to [place] if her insurance will pay. She also agrees to discharge to [facility] and stated that she "had been there before and taught them how to do the twist". She refused going to [facility]. She will need EMS for O2 delivery and agrees to that too. RNCM has notified Carina with Administrator, Civil Service and she will review chart. I suspect that patient will not meet LTAC requirements and therefore she will go to SNF at [facility].

## 2015-11-09 NOTE — NC FL2 (Signed)
[place] LEVEL OF CARE SCREENING TOOL     IDENTIFICATION  Patient Name: Kaitlin Henderson Birthdate: Sep 27, 1940 Sex: female Admission Date (Current Location): 11/02/2015  [place] and [place] Number:  Engineering geologist and Address:  [facility], [address] [place], [place] [address]      Provider Number: [id number]  Attending Physician Name and Address:  Loletha Grayer, MD  Relative Name and Phone Number:       Current Level of Care: Hospital Recommended Level of Care: [place] Prior Approval Number:    Date Approved/Denied:   PASRR Number:   [id number]   Discharge Plan: SNF    Current Diagnoses: Patient Active Problem List   Diagnosis Date Noted  . COPD exacerbation ([employer])   . Acute respiratory failure with hypoxia ([place])   . SOB (shortness of breath) 11/02/2015  . Non-compliant behavior 12/05/2014  . Decreased mobility 08/15/2014  . Benign essential tremor 09/05/2011  . Breast cancer, right breast ([place])   . DEPRESSION 09/25/2010  . HAIR LOSS 06/26/2010  . Type II or unspecified type diabetes mellitus with neurological manifestations, not stated as uncontrolled 03/14/2010  . POLYURIA 03/13/2010  . APHTHOUS STOMATITIS 08/23/2009  . CHRONIC RESPIRATORY FAILURE 08/18/2007  . HYPERLIPIDEMIA 08/17/2007  . OBESITY 08/17/2007  . CORONARY ARTERY DISEASE 08/17/2007  . COPD with emphysema, [place]  08/17/2007    Orientation RESPIRATION BLADDER Height & Weight     Self, Time, Situation, Place  O2 (5 Liters Oxygen ) Continent Weight: 196 lb 3.4 oz (89 kg) Height:  5\' 2"  (157.5 cm)  BEHAVIORAL SYMPTOMS/MOOD NEUROLOGICAL BOWEL NUTRITION STATUS   (none )  (none ) Incontinent Diet (Diet: Heart Healthy/ Carb Modified )  AMBULATORY STATUS COMMUNICATION OF NEEDS Skin   Extensive Assist Verbally Normal                       Personal Care Assistance Level of Assistance  Bathing, Feeding, Dressing Bathing  Assistance: Limited assistance Feeding assistance: Independent Dressing Assistance: Limited assistance     Functional Limitations Info  Sight, Hearing, Speech Sight Info: Adequate Hearing Info: Adequate Speech Info: Adequate    SPECIAL CARE FACTORS FREQUENCY  PT (By licensed PT), OT (By licensed OT)     PT Frequency:  (5) OT Frequency:  (5)            Contractures      Additional Factors Info  Code Status, Allergies Code Status Info:  (Full Code. ) Allergies Info:  (Codeine, Levaquin, Sulfonamide Derivatives)           Current Medications (11/09/2015):  This is the current hospital active medication list Current Facility-Administered Medications  Medication Dose Route Frequency Provider Last Rate Last Dose  . 0.9 %  sodium chloride infusion  250 mL Intravenous PRN Dustin Flock, MD      . ALPRAZolam Duanne Moron) tablet 0.25 mg  0.25 mg Oral TID PRN Henreitta Leber, MD   0.25 mg at 11/08/15 1345  . azithromycin (ZITHROMAX) 500 mg in dextrose 5 % 250 mL IVPB  500 mg Intravenous Q24H Loletha Grayer, MD   500 mg at 11/08/15 1656  . budesonide (PULMICORT) nebulizer solution 0.5 mg  0.5 mg Nebulization BID Flora Lipps, MD   0.5 mg at 11/09/15 0756  . enoxaparin (LOVENOX) injection 40 mg  40 mg Subcutaneous Q24H Loletha Grayer, MD   40 mg at 11/09/15 0836  . ibuprofen (ADVIL,MOTRIN) tablet 400 mg  400 mg Oral Q8H PRN Henreitta Leber, MD      . insulin aspart (novoLOG) injection 0-5 Units  0-5 Units Subcutaneous QHS Loletha Grayer, MD   3 Units at 11/08/15 2135  . insulin aspart (novoLOG) injection 0-9 Units  0-9 Units Subcutaneous TID WC Loletha Grayer, MD   2 Units at 11/09/15 [phone]  . insulin aspart (novoLOG) injection 7 Units  7 Units Subcutaneous TID WC Loletha Grayer, MD   7 Units at 11/09/15 [phone]  . insulin detemir (LEVEMIR) injection 13 Units  13 Units Subcutaneous QHS Loletha Grayer, MD   13 Units at 11/08/15 2135  . ipratropium-albuterol (DUONEB) 0.5-2.5 (3) MG/3ML  nebulizer solution 3 mL  3 mL Nebulization Q6H PRN Dustin Flock, MD      . ipratropium-albuterol (DUONEB) 0.5-2.5 (3) MG/3ML nebulizer solution 3 mL  3 mL Nebulization Q4H Flora Lipps, MD   3 mL at 11/09/15 0756  . magic mouthwash  10 mL Oral QID PRN Loletha Grayer, MD      . methylPREDNISolone sodium succinate (SOLU-MEDROL) 125 mg/2 mL injection 60 mg  60 mg Intravenous Q12H Flora Lipps, MD   60 mg at 11/09/15 0021  . mometasone-formoterol (DULERA) 100-5 MCG/ACT inhaler 2 puff  2 puff Inhalation BID Flora Lipps, MD   2 puff at 11/08/15 2120  . ondansetron (ZOFRAN) tablet 4 mg  4 mg Oral Q6H PRN Dustin Flock, MD       Or  . ondansetron [facility]) injection 4 mg  4 mg Intravenous Q6H PRN Dustin Flock, MD   4 mg at 11/05/15 2147  . pantoprazole (PROTONIX) EC tablet 40 mg  40 mg Oral Daily Dustin Flock, MD   40 mg at 11/09/15 0836  . piperacillin-tazobactam (ZOSYN) IVPB 3.375 g  3.375 g Intravenous 3 times per day Laverle Hobby, MD   3.375 g at 11/09/15 0620  . polyethylene glycol (MIRALAX / GLYCOLAX) packet 17 g  17 g Oral Daily Loletha Grayer, MD   17 g at 11/09/15 [id number]  . tiotropium (SPIRIVA) inhalation capsule 18 mcg  18 mcg Inhalation Daily Flora Lipps, MD   18 mcg at 11/08/15 1003     Discharge Medications: Please see discharge summary for a list of discharge medications.  Relevant Imaging Results:  Relevant Lab Results:   Additional Information  SSN: [id number]  Loralyn Freshwater, LCSW

## 2015-11-09 NOTE — Discharge Instructions (Signed)
Respiratory failure is when your lungs are not working well and your breathing (respiratory) system fails. When respiratory failure occurs, it is difficult for your lungs to get enough oxygen, get rid of carbon dioxide, or both. Respiratory failure can be life threatening.  °Respiratory failure can be acute or chronic. Acute respiratory failure is sudden, severe, and requires emergency medical treatment. Chronic respiratory failure is less severe, happens over time, and requires ongoing treatment.  °WHAT ARE THE CAUSES OF ACUTE RESPIRATORY FAILURE?  °Any problem affecting the heart or lungs can cause acute respiratory failure. Some of these causes include the following: °· Chronic bronchitis and emphysema (COPD).   °· Blood clot going to a lung (pulmonary embolism).   °· Having water in the lungs caused by heart failure, lung injury, or infection (pulmonary edema).   °· Collapsed lung (pneumothorax).   °· Pneumonia.   °· Pulmonary fibrosis.   °· Obesity.   °· Asthma.   °· Heart failure.   °· Any type of trauma to the chest that can make breathing difficult.   °· Nerve or muscle diseases making chest movements difficult. °HOW WILL MY ACUTE RESPIRATORY FAILURE BE TREATED?  °Treatment of acute respiratory failure depends on the cause of the respiratory failure. Usually, you will stay in the intensive care unit so your breathing can be watched closely. Treatment can include the following: °· Oxygen. Oxygen can be delivered through the following: °¨ Nasal cannula. This is small tubing that goes in your nose to give you oxygen. °¨ Face mask. A face mask covers your nose and mouth to give you oxygen. °· Medicine. Different medicines can be given to help with breathing. These can include: °¨ Nebulizers. Nebulizers deliver medicines to open the air passages (bronchodilators). These medicines help to open or relax the airways in the lungs so you can breathe better. They can also help loosen mucus from your  lungs. °¨ Diuretics. Diuretic medicines can help you breathe better by getting rid of extra water in your body. °¨ Steroids. Steroid medicines can help decrease swelling (inflammation) in your lungs. °¨ Antibiotics. °· Chest tube. If you have a collapsed lung (pneumothorax), a chest tube is placed to help reinflate the lung. °· Noninvasive positive pressure ventilation (NPPV). This is a tight-fitting mask that goes over your nose and mouth. The mask has tubing that is attached to a machine. The machine blows air into the tubing, which helps to keep the tiny air sacs (alveoli) in your lungs open. This machine allows you to breathe on your own. °· Ventilator. A ventilator is a breathing machine. When on a ventilator, a breathing tube is put into the lungs. A ventilator is used when you can no longer breathe well enough on your own. You may have low oxygen levels or high carbon dioxide (CO2) levels in your blood. When you are on a ventilator, sedation and pain medicines are given to make you sleep so your lungs can heal. °SEEK IMMEDIATE MEDICAL CARE IF: °· You have shortness of breath (dyspnea) with or without activity. °· You have rapid breathing (tachypnea). °· You are wheezing. °· You are unable to say more than a few words without having to catch your breath. °· You find it very difficult to function normally. °· You have a fast heart rate. °· You have a bluish color to your finger or toe nail beds. °· You have confusion or drowsiness or both. °  °This information is not intended to replace advice given to you by your health care provider. Make sure you discuss   any questions you have with your health care provider. °  °Document Released: 08/24/2013 Document Revised: 05/10/2015 Document Reviewed: 08/24/2013 °Elsevier Interactive Patient Education ©2016 [employer] Inc[employer] ° °

## 2015-11-09 NOTE — Care Management (Signed)
Medicare will not cover LTAC. Plan for [facility] today. No further RNCM needs. Case closed.

## 2015-11-09 NOTE — Discharge Summary (Signed)
[place] [facility] [place] NAME: Kaitlin Henderson    MR#:  [id number]  DATE OF BIRTH:  [birth year]  DATE OF ADMISSION:  11/02/2015 ADMITTING PHYSICIAN: Dustin Flock, MD  DATE OF DISCHARGE: 11/09/2015  PRIMARY CARE PHYSICIAN: Gwendolyn Grant, MD    ADMISSION DIAGNOSIS:  SOB (shortness of breath) [R06.02] COPD exacerbation (HCC) [J44.1] Acute respiratory failure with hypoxia (HCC) [J96.01]  DISCHARGE DIAGNOSIS:  Active Problems:   SOB (shortness of breath)   Acute respiratory failure with hypoxia (HCC)   COPD exacerbation (HCC)   SECONDARY DIAGNOSIS:   Past Medical History  Diagnosis Date  . Breast cancer, right breast ([place]) 1998    s/p chemo & XRT, right mastectomy  . OBESITY   . CORONARY ARTERY DISEASE   . APHTHOUS STOMATITIS   . DEPRESSION     started sertraline 09/2010  . DIABETES, TYPE 2 dx 03/2010  . HYPERLIPIDEMIA   . Hypothyroidism   . C O P D     chronic O2 3LPM [place]  . Essential tremor   . NSTEMI (non-ST elevated myocardial infarction) ([place]) 01/2012    med mgmt  . Anginal pain ([place])   . GERD (gastroesophageal reflux disease)     HOSPITAL COURSE:   1. Acute on chronic respiratory failure. Patient was admitted and was not moving much air at all. She was started on antibiotics and high-dose steroids. During the hospital course she required high flow nasal cannula as much as 90% oxygen. The patient was tapered off of high flow nasal cannula and is on regular nasal cannula at 5 L. She normally wears 3 L at home prior to hospitalization. Can continue to try to taper oxygen. The patient has rescinded her DO NOT RESUSCITATE status and is now a full code. 2. COPD exacerbation. The patient was on Solu-Medrol the entire hospital course and switch over to prednisone taper. I will continue albuterol nebulizers, budesonide nebulizers and Spiriva. Patient was on triple antibiotics urinalysis switch over to doxycycline upon discharge  home. 3. 2.1 spiculated lung nodule. Patient is no condition to have a lung biopsy. 4. Type 2 diabetes mellitus. Levemir increased to 15 units at night with 7 units of NovoLog prior to meals. Check fingersticks before every meal seen daily at bedtime sugars should come down as we taper the steroids. 5. Anxiety on Xanax 6. Gastroesophageal reflux disease on PPI 7. Left lower extremity cellulitis continue finish up antibiotic course. 8. Weakness. Patient refused to go to the hospice home. She wants to go to rehabilitation.    DISCHARGE CONDITIONS:  Guarded  CONSULTS OBTAINED:  Pulmonary  DRUG ALLERGIES:   Allergies  Allergen Reactions  . Codeine     REACTION: makes pt pass out  . Levaquin [Levofloxacin In D5w] Other (See Comments)    redness  . Sulfonamide Derivatives     REACTION: edema    DISCHARGE MEDICATIONS:   Current Discharge Medication List    START taking these medications   Details  budesonide (PULMICORT) 0.5 MG/2ML nebulizer solution Take 2 mLs (0.5 mg total) by nebulization 2 (two) times daily. Qty: 75 mL, Refills: 0    doxycycline (VIBRA-TABS) 100 MG tablet Take 1 tablet (100 mg total) by mouth every 12 (twelve) hours. Qty: 20 tablet, Refills: 0    insulin aspart (NOVOLOG) 100 UNIT/ML injection Inject 7 Units into the skin 3 (three) times daily with meals. Qty: 10 mL, Refills: 0    insulin detemir (LEVEMIR) 100 UNIT/ML injection  Inject 0.15 mLs (15 Units total) into the skin at bedtime. Qty: 10 mL, Refills: 0    polyethylene glycol (MIRALAX / GLYCOLAX) packet Take 17 g by mouth daily as needed for moderate constipation. Qty: 14 each, Refills: 0    predniSONE (DELTASONE) 5 MG tablet Take 6 tabs po  day1; 5 tabs po day2; 4 tabs day 3; 3 tabs po day4; 2 tabs po day5, 1 tab po day6,7 Qty: 22 tablet, Refills: 0      CONTINUE these medications which have CHANGED   Details  albuterol (PROVENTIL) (2.5 MG/3ML) 0.083% nebulizer solution Take 3 mLs (2.5 mg  total) by nebulization every 6 (six) hours as needed for wheezing or shortness of breath. Qty: 75 mL, Refills: 0    ALPRAZolam (XANAX) 0.25 MG tablet Take 1 tablet (0.25 mg total) by mouth 3 (three) times daily as needed for anxiety. Qty: 30 tablet, Refills: 0    tiotropium (SPIRIVA) 18 MCG inhalation capsule Place 1 capsule (18 mcg total) into inhaler and inhale daily. Qty: 30 capsule, Refills: 0      CONTINUE these medications which have NOT CHANGED   Details  atorvastatin (LIPITOR) 40 MG tablet Take 1 tablet (40 mg total) by mouth daily. Qty: 90 tablet, Refills: 3    esomeprazole (NEXIUM) 40 MG capsule TAKE 1 CAPSULE BY MOUTH ONCE A DAY Qty: 30 capsule, Refills: 5      STOP taking these medications     aspirin 81 MG tablet      Fluticasone-Salmeterol (ADVAIR) 250-50 MCG/DOSE AEPB      furosemide (LASIX) 40 MG tablet      albuterol (PROAIR HFA) 108 (90 BASE) MCG/ACT inhaler      albuterol (PROAIR HFA) 108 (90 BASE) MCG/ACT inhaler      glucose blood (ONE TOUCH ULTRA TEST) test strip      Lancets MISC      metFORMIN (GLUCOPHAGE-XR) 500 MG 24 hr tablet      ONE TOUCH ULTRA TEST test strip      potassium chloride SA (K-DUR,KLOR-CON) 20 MEQ tablet      traMADol (ULTRAM) 50 MG tablet          DISCHARGE INSTRUCTIONS:   Follow-up with Dr. rehabilitation 1 day  If you experience worsening of your admission symptoms, develop shortness of breath, life threatening emergency, suicidal or homicidal thoughts you must seek medical attention immediately by calling 911 or calling your MD immediately  if symptoms less severe.  You Must read complete instructions/literature along with all the possible adverse reactions/side effects for all the Medicines you take and that have been prescribed to you. Take any new Medicines after you have completely understood and accept all the possible adverse reactions/side effects.   Please note  You were cared for by a hospitalist during  your hospital stay. If you have any questions about your discharge medications or the care you received while you were in the hospital after you are discharged, you can call the unit and asked to speak with the hospitalist on call if the hospitalist that took care of you is not available. Once you are discharged, your primary care physician will handle any further medical issues. Please note that NO REFILLS for any discharge medications will be authorized once you are discharged, as it is imperative that you return to your primary care physician (or establish a relationship with a primary care physician if you do not have one) for your aftercare needs so that they can reassess  your need for medications and monitor your lab values.    Today   CHIEF COMPLAINT:   Chief Complaint  Patient presents with  . Shortness of Breath    HISTORY OF PRESENT ILLNESS:  Kaitlin Henderson  is a 75 y.o. female with a known history of end-stage COPD on oxygen at home presents with shortness of breath.   VITAL SIGNS:  Blood pressure 119/59, pulse 97, temperature 98.3 F (36.8 C), temperature source Oral, resp. rate 19, height 5\' 2"  (1.575 m), weight 89 kg (196 lb 3.4 oz), SpO2 91 %.    PHYSICAL EXAMINATION:  GENERAL:  75 y.o.-year-old patient lying in the bed with no acute distress.  EYES: Pupils equal, round, reactive to light and accommodation. No scleral icterus. Extraocular muscles intact.  HEENT: Head atraumatic, normocephalic. Oropharynx and nasopharynx clear.  NECK:  Supple, no jugular venous distention. No thyroid enlargement, no tenderness.  LUNGS: Decreased breath sounds bilaterally, no wheezing, rales,rhonchi or crepitation. No use of accessory muscles of respiration.  CARDIOVASCULAR: S1, S2 normal. No murmurs, rubs, or gallops.  ABDOMEN: Soft, non-tender, non-distended. Bowel sounds present. No organomegaly or mass.  EXTREMITIES: 2+ edema, no cyanosis, or clubbing.  NEUROLOGIC: Cranial nerves II  through XII are intact. Muscle strength 5/5 in all extremities. Sensation intact. Gait not checked.  PSYCHIATRIC: The patient is alert and oriented x 3.  SKIN: Left lower extremity erythema and warmth. Patient also has scaling bilateral lower extremities  DATA REVIEW:   CBC  Recent Labs Lab 11/09/15 0652  WBC 10.5  HGB 15.3  HCT 44.3  PLT 228    Chemistries   Recent Labs Lab 11/02/15 1602 11/03/15 0637  11/09/15 0652  NA 142 144  --   --   K 4.0 4.6  --   --   CL 99* 103  --   --   CO2 34* 32  --   --   GLUCOSE 138* 187*  --   --   BUN 15 15  --   --   CREATININE 0.97 1.04*  < > 0.98  CALCIUM 8.9 8.7*  --   --   MG 2.1  --   --   --   AST 21  --   --   --   ALT 13*  --   --   --   ALKPHOS 69  --   --   --   BILITOT 1.0  --   --   --   < > = values in this interval not displayed.  Cardiac Enzymes  Recent Labs Lab 11/02/15 1602  TROPONINI 0.03    Microbiology Results  Results for orders placed or performed during the hospital encounter of 11/02/15  Urine culture     Status: None   Collection Time: 11/02/15  4:01 PM  Result Value Ref Range Status   Specimen Description URINE, CLEAN CATCH  Final   Special Requests Normal  Final   Culture MULTIPLE SPECIES PRESENT, SUGGEST RECOLLECTION  Final   Report Status 11/06/2015 FINAL  Final  Blood Culture (routine x 2)     Status: None   Collection Time: 11/02/15  5:53 PM  Result Value Ref Range Status   Specimen Description BLOOD LEFT ASSIST CONTROL  Final   Special Requests   Final    BOTTLES DRAWN AEROBIC AND ANAEROBIC  4CC AERO, 5CC ANA   Culture NO GROWTH 5 DAYS  Final   Report Status 11/07/2015 FINAL  Final  Blood Culture (  routine x 2)     Status: None   Collection Time: 11/02/15  5:55 PM  Result Value Ref Range Status   Specimen Description BLOOD LEFT HAND  Final   Special Requests   Final    BOTTLES DRAWN AEROBIC AND ANAEROBIC  6CC AERO, 3CC ANA   Culture NO GROWTH 5 DAYS  Final   Report Status  11/07/2015 FINAL  Final  MRSA PCR Screening     Status: None   Collection Time: 11/05/15  3:55 PM  Result Value Ref Range Status   MRSA by PCR NEGATIVE NEGATIVE Final    Comment:        The GeneXpert MRSA Assay (FDA approved for NASAL specimens only), is one component of a comprehensive MRSA colonization surveillance program. It is not intended to diagnose MRSA infection nor to guide or monitor treatment for MRSA infections.   Gastrointestinal Panel by PCR , Stool     Status: None   Collection Time: 11/05/15  5:18 PM  Result Value Ref Range Status   Campylobacter species NOT DETECTED NOT DETECTED Final   Plesimonas shigelloides NOT DETECTED NOT DETECTED Final   Salmonella species NOT DETECTED NOT DETECTED Final   Yersinia enterocolitica NOT DETECTED NOT DETECTED Final   Vibrio species NOT DETECTED NOT DETECTED Final   Vibrio cholerae NOT DETECTED NOT DETECTED Final   Enteroaggregative E coli (EAEC) NOT DETECTED NOT DETECTED Final   Enteropathogenic E coli (EPEC) NOT DETECTED NOT DETECTED Final   Enterotoxigenic E coli (ETEC) NOT DETECTED NOT DETECTED Final   Shiga like toxin producing E coli (STEC) NOT DETECTED NOT DETECTED Final   E. coli O157 NOT DETECTED NOT DETECTED Final   Shigella/Enteroinvasive E coli (EIEC) NOT DETECTED NOT DETECTED Final   Cryptosporidium NOT DETECTED NOT DETECTED Final   Cyclospora cayetanensis NOT DETECTED NOT DETECTED Final   Entamoeba histolytica NOT DETECTED NOT DETECTED Final   Giardia lamblia NOT DETECTED NOT DETECTED Final   Adenovirus F40/41 NOT DETECTED NOT DETECTED Final   Astrovirus NOT DETECTED NOT DETECTED Final   Norovirus GI/GII NOT DETECTED NOT DETECTED Final   Rotavirus A NOT DETECTED NOT DETECTED Final   Sapovirus (I, II, IV, and V) NOT DETECTED NOT DETECTED Final    RADIOLOGY:  Dg Chest Port 1 View  11/07/2015  CLINICAL DATA:  PICC line placement EXAM: PORTABLE CHEST 1 VIEW COMPARISON:  11/06/2015 FINDINGS: There is a  right-sided PICC line with the tip projecting over the cavoatrial junction. There is no focal parenchymal opacity. There is no pleural effusion or pneumothorax. The heart and mediastinal contours are unremarkable. The osseous structures are unremarkable. IMPRESSION: 1. Right-sided PICC line with the tip projecting over the cavoatrial junction. Electronically Signed   By: Kathreen Devoid   On: 11/07/2015 13:18   Management plans discussed with the patient, and she is in agreement. Patient had refused for me to call her daughter during the entire hospital course.  CODE STATUS:     Code Status Orders        Start     Ordered   11/08/15 2232  Full code   Continuous     11/08/15 2232    Code Status History    Date Active Date Inactive Code Status Order ID Comments User Context   11/04/2015 10:59 AM 11/08/2015 10:32 PM DNR [id number]  Laverle Hobby, MD Inpatient   11/02/2015  8:23 PM 11/04/2015 10:59 AM Full Code [id number]  Dustin Flock, MD Inpatient   01/17/2012  8:32  PM 01/28/2012  4:49 PM DNR [id number]  Mila Palmer, RN Inpatient      TOTAL TIME TAKING CARE OF THIS PATIENT: 35 minutes.    Loletha Grayer M.D on 11/09/2015 at 10:42 AM  Between 7am to 6pm - Pager - [phone]  After 6pm go to [email or web address] - password [email or web address] [facility]  Office  [phone]  CC: Primary care physician; Gwendolyn Grant, MD

## 2015-11-09 NOTE — Care Management (Signed)
Patient now wanting to change code status to FULL, refusing Hospice care. She is now on 5 liters.

## 2015-11-09 NOTE — Clinical Social Work Placement (Signed)
   CLINICAL SOCIAL WORK PLACEMENT  NOTE  Date:  11/09/2015  Patient Details  Name: Kaitlin Henderson MRN: [id number] Date of Birth: Feb 25, 1941  Clinical Social Work is seeking post-discharge placement for this patient at the [place] level of care (*CSW will initial, date and re-position this form in  chart as items are completed):  Yes   Patient/family provided with [facility] Work Department's list of facilities offering this level of care within the geographic area requested by the patient (or if unable, by the patient's family).  Yes   Patient/family informed of their freedom to choose among providers that offer the needed level of care, that participate in Medicare, Medicaid or managed care program needed by the patient, have an available bed and are willing to accept the patient.  Yes   Patient/family informed of [facility]'s ownership interest in [facility] and [facility], as well as of the fact that they are under no obligation to receive care at these facilities.  PASRR submitted to EDS on       PASRR number received on       Existing PASRR number confirmed on 11/09/15     FL2 transmitted to all facilities in geographic area requested by pt/family on 11/09/15     FL2 transmitted to all facilities within larger geographic area on       Patient informed that his/her managed care company has contracts with or will negotiate with certain facilities, including the following:        Yes   Patient/family informed of bed offers received.  Patient chooses bed at  [facility]) )     Physician recommends and patient chooses bed at      Patient to be transferred to  ([facility]) ) on 11/09/15.  Patient to be transferred to facility by  [facility] )     Patient family notified on 11/09/15 of transfer.  Name of family member notified:   ([place] left patient's boyfriend Randall Hiss a Advertising account executive. )     PHYSICIAN    Additional Comment:    _______________________________________________ Loralyn Freshwater, LCSW 11/09/2015, 11:45 AM

## 2015-11-10 ENCOUNTER — Encounter: Payer: Self-pay | Admitting: *Deleted

## 2015-11-10 ENCOUNTER — Telehealth: Payer: Self-pay | Admitting: *Deleted

## 2015-11-10 ENCOUNTER — Non-Acute Institutional Stay (SKILLED_NURSING_FACILITY): Payer: Medicare Other | Admitting: Internal Medicine

## 2015-11-10 DIAGNOSIS — F419 Anxiety disorder, unspecified: Secondary | ICD-10-CM

## 2015-11-10 DIAGNOSIS — J9621 Acute and chronic respiratory failure with hypoxia: Secondary | ICD-10-CM | POA: Diagnosis not present

## 2015-11-10 DIAGNOSIS — L03116 Cellulitis of left lower limb: Secondary | ICD-10-CM | POA: Diagnosis not present

## 2015-11-10 DIAGNOSIS — E785 Hyperlipidemia, unspecified: Secondary | ICD-10-CM

## 2015-11-10 DIAGNOSIS — J441 Chronic obstructive pulmonary disease with (acute) exacerbation: Secondary | ICD-10-CM | POA: Diagnosis not present

## 2015-11-10 DIAGNOSIS — R911 Solitary pulmonary nodule: Secondary | ICD-10-CM | POA: Diagnosis not present

## 2015-11-10 DIAGNOSIS — E1149 Type 2 diabetes mellitus with other diabetic neurological complication: Secondary | ICD-10-CM

## 2015-11-10 DIAGNOSIS — K219 Gastro-esophageal reflux disease without esophagitis: Secondary | ICD-10-CM | POA: Diagnosis not present

## 2015-11-10 NOTE — Telephone Encounter (Signed)
Pt was on TCM list admitted for COPD & SOB. D/C 11/09/15 sent to SNF...Johny Chess

## 2015-11-10 NOTE — Progress Notes (Signed)
MRN: [id number] Name: Kaitlin Henderson  Sex: female Age: 75 y.o. DOB: [birth year]  [place] #:  Facility/Room:[facility]  109-P Level Of Care: SNF Provider: Inocencio Homes  MD Emergency Contacts: Extended Emergency Contact Information Primary Emergency Contact: Hick,Eric  [place] Mobile Phone: [phone] Relation: Significant other Secondary Emergency Contact: Joslyn Hy [place] Phone: [phone] Relation: Neighbor  Code Status: FullCode  Allergies: Codeine; Levaquin; and Sulfonamide derivatives  Chief Complaint  Patient presents with  . New Admit To SNF    HPI: Patient is 75 y.o. 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 She was admitted to the hospital from 3/2-9 where she was treated for acute on chronic respiratory failure and an acute COPD exacerbation. Hospital course was complicated by the finding of a spiculated lung nodule presumed to be malignant and by a LLE cellulitis. Pt is admitted to SNF on 3/9 because she refused to go to Hospice home. While at SNF pt will be followed for anxiety ,tx with xanax, GERD, tx with nexium 40 mg daily and HLD, tx with lipitor.   Past Medical History  Diagnosis Date  . Breast cancer, right breast ([place]) 1998    s/p chemo & XRT, right mastectomy  . OBESITY   . CORONARY ARTERY DISEASE   . APHTHOUS STOMATITIS   . DEPRESSION     started sertraline 09/2010  . DIABETES, TYPE 2 dx 03/2010  . HYPERLIPIDEMIA   . Hypothyroidism   . C O P D     chronic O2 3LPM [place]  . Essential tremor   . NSTEMI (non-ST elevated myocardial infarction) ([place]) 01/2012    med mgmt  . Anginal pain ([place])   . GERD (gastroesophageal reflux disease)     Past Surgical History  Procedure Laterality Date  . Appendectomy    . Cholecystectomy    . Abdominal hysterectomy    . Right mastectomy    . Tonsillectomy    . Mastectomy        Medication List        This list is accurate as of: 11/10/15 11:59 PM.  Always use your most recent med list.               albuterol (2.5 MG/3ML) 0.083% nebulizer solution  Commonly known as:  PROVENTIL  Take 3 mLs (2.5 mg total) by nebulization every 6 (six) hours as needed for wheezing or shortness of breath.     ALPRAZolam 0.25 MG tablet  Commonly known as:  XANAX  Take 1 tablet (0.25 mg total) by mouth 3 (three) times daily as needed for anxiety.     atorvastatin 40 MG tablet  Commonly known as:  LIPITOR  Take 1 tablet (40 mg total) by mouth daily.     budesonide 0.5 MG/2ML nebulizer solution  Commonly known as:  PULMICORT  Take 2 mLs (0.5 mg total) by nebulization 2 (two) times daily.     doxycycline 100 MG tablet  Commonly known as:  VIBRA-TABS  Take 1 tablet (100 mg total) by mouth every 12 (twelve) hours.     esomeprazole 40 MG capsule  Commonly known as:  NEXIUM  TAKE 1 CAPSULE BY MOUTH ONCE A DAY     insulin aspart 100 UNIT/ML injection  Commonly known as:  novoLOG  Inject 7 Units into the skin 3 (three) times daily with meals.     insulin detemir 100 UNIT/ML injection  Commonly known as:  LEVEMIR  Inject 0.15 mLs (15 Units total) into the skin at bedtime.     polyethylene glycol packet  Commonly known as:  MIRALAX / GLYCOLAX  Take 17 g by mouth daily as needed for moderate constipation.     predniSONE 5 MG tablet  Commonly known as:  DELTASONE  Take 6 tabs po  day1; 5 tabs po day2; 4 tabs day 3; 3 tabs po day4; 2 tabs po day5, 1 tab po day6,7     tiotropium 18 MCG inhalation capsule  Commonly known as:  SPIRIVA  Place 1 capsule (18 mcg total) into inhaler and inhale daily.        No orders of the defined types were placed in this encounter.    Immunization History  Administered Date(s) Administered  . Influenza Split 06/14/2011, 06/08/2012  . Influenza Whole 06/19/2009, 06/26/2010  . Influenza,inj,Quad PF,36+ Mos 05/07/2013  . Pneumococcal Polysaccharide-23  06/14/2008, 05/07/2013    Social History  Substance Use Topics  . Smoking status: Former Smoker -- 2.50 packs/day for 54 years    Types: Cigarettes    Quit date: 09/02/2006  . Smokeless tobacco: Never Used     Comment: Retired. Widow/widower since 2008-lives alone. Home hospice since 06/2009 related to COPD  . Alcohol Use: No    Family history is +DM   Review of Systems  DATA OBTAINED: from patient, nurse GENERAL:  no fevers, fatigue, appetite changes SKIN: No itching, rash or wounds EYES: No eye pain, redness, discharge EARS: No earache, tinnitus, change in hearing NOSE: No congestion, drainage or bleeding  MOUTH/THROAT: No mouth or tooth pain, No sore throat RESPIRATORY: No cough, wheezing, SOB CARDIAC: No chest pain, palpitations, lower extremity edema  GI: No abdominal pain, No N/V/D or constipation, No heartburn or reflux  GU: No dysuria, frequency or urgency, or incontinence  MUSCULOSKELETAL: No unrelieved bone/joint pain NEUROLOGIC: No headache, dizziness or focal weakness PSYCHIATRIC: No c/o anxiety or sadness   Filed Vitals:   11/10/15 1105  BP: 133/83  Pulse: 103  Temp: 98.6 F (37 C)  Resp: 16    SpO2 Readings from Last 1 Encounters:  11/09/15 87%        Physical Exam  GENERAL APPEARANCE: Alert, conversant,  No acute distress.  SKIN: No diaphoresis rash HEAD: Normocephalic, atraumatic  EYES: Conjunctiva/lids clear. Pupils round, reactive. EOMs intact.  EARS: External exam WNL, canals clear. Hearing grossly normal.  NOSE: No deformity or discharge.  MOUTH/THROAT: Lips w/o lesions  RESPIRATORY: Breathing is even, unlabored. Lung sounds are diffusely decreased, pt on 5l with O2 sat 95%   CARDIOVASCULAR: Heart RRR no murmurs, rubs or gallops. trace peripheral edema.   GASTROINTESTINAL: Abdomen is soft, non-tender, not distended w/ normal bowel sounds. GENITOURINARY: Bladder non tender, not distended  MUSCULOSKELETAL: No abnormal joints or  musculature NEUROLOGIC:  Cranial nerves 2-12 grossly intact. Moves all extremities  PSYCHIATRIC: Mood and affect appropriate to situation, no behavioral issues  Patient Active Problem List   Diagnosis Date Noted  . Lung nodule 11/25/2015  . Anxiety 11/25/2015  . GERD (gastroesophageal reflux disease) 11/25/2015  . Cellulitis of left lower extremity 11/25/2015  . Leukocytosis 11/16/2015  . Cellulitis 11/16/2015  . 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 COPD exacerbation ([place])   . Acute on chronic respiratory failure with hypoxia ([place])   . SOB (shortness of breath) 11/02/2015  . Non-compliant behavior 12/05/2014  . Decreased mobility 08/15/2014  . Benign essential tremor 09/05/2011  .  Breast cancer, right breast ([place])   . DEPRESSION 09/25/2010  . HAIR LOSS 06/26/2010  . Type 2 diabetes mellitus with neurological complications ([place]) [id number]  . POLYURIA 03/13/2010  . APHTHOUS STOMATITIS 08/23/2009  . CHRONIC RESPIRATORY FAILURE 08/18/2007  . Hyperlipidemia 08/17/2007  . OBESITY 08/17/2007  . CORONARY ARTERY DISEASE 08/17/2007  . COPD with emphysema, Gold D  08/17/2007    CBC    Component Value Date/Time   WBC 10.5 11/09/2015 0652   RBC 4.65 11/09/2015 0652   HGB 15.3 11/09/2015 0652   HCT 44.3 11/09/2015 0652   PLT 228 11/09/2015 0652   MCV 95.2 11/09/2015 0652   LYMPHSABS 1.9 11/02/2015 1602   MONOABS 0.6 11/02/2015 1602   EOSABS 0.1 11/02/2015 1602   BASOSABS 0.0 11/02/2015 1602    CMP     Component Value Date/Time   NA 144 11/03/2015 0637   K 4.6 11/03/2015 0637   CL 103 11/03/2015 0637   CO2 32 11/03/2015 0637   GLUCOSE 187* 11/03/2015 0637   BUN 15 11/03/2015 0637   CREATININE 0.98 11/09/2015 0652   CALCIUM 8.7* 11/03/2015 0637   PROT 7.4 11/02/2015 1602   ALBUMIN 3.6 11/02/2015 1602   AST 21 11/02/2015 1602   ALT 13* 11/02/2015 1602   ALKPHOS 69 11/02/2015 1602   BILITOT 1.0 11/02/2015 1602   GFRNONAA 55* 11/09/2015 0652   GFRAA >60  11/09/2015 0652    Lab Results  Component Value Date   HGBA1C 6.0 07/14/2013     Dg Chest Port 1 View  11/07/2015  CLINICAL DATA:  PICC line placement EXAM: PORTABLE CHEST 1 VIEW COMPARISON:  11/06/2015 FINDINGS: There is a right-sided PICC line with the tip projecting over the cavoatrial junction. There is no focal parenchymal opacity. There is no pleural effusion or pneumothorax. The heart and mediastinal contours are unremarkable. The osseous structures are unremarkable. IMPRESSION: 1. Right-sided PICC line with the tip projecting over the cavoatrial junction. Electronically Signed   By: Kathreen Devoid   On: 11/07/2015 13:18    Not all labs, radiology exams or other studies done during hospitalization come through on my EPIC note; however they are reviewed by me.    Assessment and Plan  Acute on chronic respiratory failure with hypoxia [facility]) Patient was admitted and was not moving much air at all. She was started on antibiotics and high-dose steroids. During the hospital course she required high flow nasal cannula as much as 90% oxygen. The patient was tapered off of high flow nasal cannula and is on regular nasal cannula at 5 L. She normally wears 3 L at home prior to hospitalization. Can continue to try to taper oxygen. The patient has rescinded her DO NOT RESUSCITATE status and is now a full code.  COPD exacerbation ([place]) The patient was on Solu-Medrol the entire hospital course and switch over to prednisone taper; continue albuterol nebulizers, budesonide nebulizers and Spiriva. Patient was on triple antibiotics SNF - cont  doxycycline , 5L O2 and spiriva, pulmicort  Lung nodule 2.1cm  spiculated lung nodule. Patient is no condition to have a lung biopsy.  Type 2 diabetes mellitus with neurological complications (HCC) Levemir increased to 15 units at night with 7 units of NovoLog prior to meals. SNF - Check fingersticks before every meal seen daily at bedtime sugars should come down  as  taper the steroids.  Anxiety SNF - cont xanax 0.25 mg TID  GERD (gastroesophageal reflux disease) SNF - cont nexium 40 mg daily  Cellulitis of left lower extremity SNF - cont doxycycline for 10 more days  Hyperlipidemia SNF - not stated as uncontrolled;cont lipitor 40 mg daily   Time spent > 45 min;> 50% of time with patient was spent reviewing records, labs, tests and studies, counseling and developing plan of care  Inocencio Homes  MD

## 2015-11-14 ENCOUNTER — Non-Acute Institutional Stay (SKILLED_NURSING_FACILITY): Payer: Medicare Other | Admitting: Internal Medicine

## 2015-11-14 DIAGNOSIS — A6009 Herpesviral infection of other urogenital tract: Secondary | ICD-10-CM | POA: Insufficient documentation

## 2015-11-14 DIAGNOSIS — L03115 Cellulitis of right lower limb: Secondary | ICD-10-CM | POA: Diagnosis not present

## 2015-11-14 DIAGNOSIS — A609 Anogenital herpesviral infection, unspecified: Secondary | ICD-10-CM

## 2015-11-14 NOTE — Progress Notes (Signed)
MRN: [id number] Name: Kaitlin Henderson  Sex: female Age: 75 y.o. DOB: 07-21-1941  [place] #: [facility] Facility/Room: Level Of Care: SNF Provider: Inocencio Homes D Emergency Contacts: Extended Emergency Contact Information Primary Emergency Contact: Hick,Eric  [place] Mobile Phone: [phone] Relation: Significant other Secondary Emergency Contact: Joslyn Hy [place] Phone: [phone] Relation: Neighbor  Code Status:   Allergies: Codeine; Levaquin; and Sulfonamide derivatives  Chief Complaint  Patient presents with  . Acute Visit    HPI: Patient is 75 y.o. female who nursing asked me to see today for possible labial herpes as she has seen clusters of ulcers. Noted today for first time and pt c/o pain for first time and itching to area as well, but it has been present for possibly several days.  Is a little better with barrier cream, worse with urination. No fever or other systemic symptom.  Past Medical History  Diagnosis Date  . Breast cancer, right breast ([place]) 1998    s/p chemo & XRT, right mastectomy  . OBESITY   . CORONARY ARTERY DISEASE   . APHTHOUS STOMATITIS   . DEPRESSION     started sertraline 09/2010  . DIABETES, TYPE 2 dx 03/2010  . HYPERLIPIDEMIA   . Hypothyroidism   . C O P D     chronic O2 3LPM [place]  . Essential tremor   . NSTEMI (non-ST elevated myocardial infarction) ([place]) 01/2012    med mgmt  . Anginal pain ([place])   . GERD (gastroesophageal reflux disease)     Past Surgical History  Procedure Laterality Date  . Appendectomy    . Cholecystectomy    . Abdominal hysterectomy    . Right mastectomy    . Tonsillectomy    . Mastectomy        Medication List       This list is accurate as of: 11/14/15 11:59 PM.  Always use your most recent med list.               albuterol (2.5 MG/3ML) 0.083% nebulizer solution  Commonly known as:  PROVENTIL  Take 3 mLs (2.5 mg total) by nebulization every 6 (six)  hours as needed for wheezing or shortness of breath.     ALPRAZolam 0.25 MG tablet  Commonly known as:  XANAX  Take 1 tablet (0.25 mg total) by mouth 3 (three) times daily as needed for anxiety.     atorvastatin 40 MG tablet  Commonly known as:  LIPITOR  Take 1 tablet (40 mg total) by mouth daily.     budesonide 0.5 MG/2ML nebulizer solution  Commonly known as:  PULMICORT  Take 2 mLs (0.5 mg total) by nebulization 2 (two) times daily.     doxycycline 100 MG tablet  Commonly known as:  VIBRA-TABS  Take 1 tablet (100 mg total) by mouth every 12 (twelve) hours.     esomeprazole 40 MG capsule  Commonly known as:  NEXIUM  TAKE 1 CAPSULE BY MOUTH ONCE A DAY     insulin aspart 100 UNIT/ML injection  Commonly known as:  novoLOG  Inject 7 Units into the skin 3 (three) times daily with meals.     insulin detemir 100 UNIT/ML injection  Commonly known as:  LEVEMIR  Inject 0.15 mLs (15 Units total) into the skin at bedtime.     polyethylene glycol packet  Commonly known as:  MIRALAX / GLYCOLAX  Take 17 g by mouth daily as needed for moderate  constipation.     predniSONE 5 MG tablet  Commonly known as:  DELTASONE  Take 6 tabs po  day1; 5 tabs po day2; 4 tabs day 3; 3 tabs po day4; 2 tabs po day5, 1 tab po day6,7     tiotropium 18 MCG inhalation capsule  Commonly known as:  SPIRIVA  Place 1 capsule (18 mcg total) into inhaler and inhale daily.        No orders of the defined types were placed in this encounter.    Immunization History  Administered Date(s) Administered  . Influenza Split 06/14/2011, 06/08/2012  . Influenza Whole 06/19/2009, 06/26/2010  . Influenza,inj,Quad PF,36+ Mos 05/07/2013  . Pneumococcal Polysaccharide-23 06/14/2008, 05/07/2013    Social History  Substance Use Topics  . Smoking status: Former Smoker -- 2.50 packs/day for 54 years    Types: Cigarettes    Quit date: 09/02/2006  . Smokeless tobacco: Never Used     Comment: Retired. Widow/widower  since 2008-lives alone. Home hospice since 06/2009 related to COPD  . Alcohol Use: No    Review of Systems  DATA OBTAINED: from patient, nurse GENERAL:  no fevers, fatigue, appetite changes SKIN: no diaphoresis; did not know of erythema on R leg;admits it is TTP HEENT: No complaint RESPIRATORY: No cough, wheezing, SOB CARDIAC: No chest pain, palpitations, lower extremity edema  GI: No abdominal pain, No N/V/D or constipation, No heartburn or reflux  GU: + dysuria, no frequency or urgency; pain and itching as in HPI MUSCULOSKELETAL: No unrelieved bone/joint pain NEUROLOGIC: No headache, dizziness  PSYCHIATRIC: No overt anxiety or sadness  Filed Vitals:   11/26/15 1201  BP: 114/62  Pulse: 88  Temp: 97.8 F (36.6 C)  Resp: 21    Physical Exam  GENERAL APPEARANCE: Alert, conversant, No acute distress  SKIN: RKE - large flakes of skin ant leg with several areas of mod redness that are TTP HEENT: Unremarkable RESPIRATORY: Breathing is even, unlabored. Lung sounds are diffusely decreased , no wheeze or rale  CARDIOVASCULAR: Heart RRR no murmurs, rubs or gallops. No peripheral edema  GASTROINTESTINAL: Abdomen is soft, non-tender, not distended w/ normal bowel sounds.  GENITOURINARY: Bladder non tender, not distended ; thin layer denuded skin of front edge of B labia majora suggerstive of breakdown of vessicle with another small area shallow ulcer L labia majora MUSCULOSKELETAL: No abnormal joints or musculature NEUROLOGIC: Cranial nerves 2-12 grossly intact. Moves all extremities PSYCHIATRIC: Mood and affect appropriate to situation, no behavioral issues  Patient Active Problem List   Diagnosis Date Noted  . Cellulitis of right lower extremity 11/26/2015  . Lung nodule 11/25/2015  . Anxiety 11/25/2015  . GERD (gastroesophageal reflux disease) 11/25/2015  . Cellulitis of left lower extremity 11/25/2015  . Leukocytosis 11/16/2015  . Cellulitis 11/16/2015  . Herpes simplex of  female genitalia 11/14/2015  . COPD exacerbation ([place])   . Acute on chronic respiratory failure with hypoxia ([place])   . SOB (shortness of breath) 11/02/2015  . Non-compliant behavior 12/05/2014  . Decreased mobility 08/15/2014  . Benign essential tremor 09/05/2011  . Breast cancer, right breast ([place])   . DEPRESSION 09/25/2010  . HAIR LOSS 06/26/2010  . Type 2 diabetes mellitus with neurological complications ([place]) [id number]  . POLYURIA 03/13/2010  . APHTHOUS STOMATITIS 08/23/2009  . CHRONIC RESPIRATORY FAILURE 08/18/2007  . Hyperlipidemia 08/17/2007  . OBESITY 08/17/2007  . CORONARY ARTERY DISEASE 08/17/2007  . COPD with emphysema, Gold D  08/17/2007    CBC    Component Value  Date/Time   WBC 10.5 11/09/2015 0652   RBC 4.65 11/09/2015 0652   HGB 15.3 11/09/2015 0652   HCT 44.3 11/09/2015 0652   PLT 228 11/09/2015 0652   MCV 95.2 11/09/2015 0652   LYMPHSABS 1.9 11/02/2015 1602   MONOABS 0.6 11/02/2015 1602   EOSABS 0.1 11/02/2015 1602   BASOSABS 0.0 11/02/2015 1602    CMP     Component Value Date/Time   NA 144 11/03/2015 0637   K 4.6 11/03/2015 0637   CL 103 11/03/2015 0637   CO2 32 11/03/2015 0637   GLUCOSE 187* 11/03/2015 0637   BUN 15 11/03/2015 0637   CREATININE 0.98 11/09/2015 0652   CALCIUM 8.7* 11/03/2015 0637   PROT 7.4 11/02/2015 1602   ALBUMIN 3.6 11/02/2015 1602   AST 21 11/02/2015 1602   ALT 13* 11/02/2015 1602   ALKPHOS 69 11/02/2015 1602   BILITOT 1.0 11/02/2015 1602   GFRNONAA 55* 11/09/2015 0652   GFRAA >60 11/09/2015 0652    Assessment and Plan  Herpes simplex of female genitalia Pt's CrCl is 51; she has been put on valtrex 1000 mg q 12 for 10 days; cont generous use of barrier cream  Cellulitis of right lower extremity If pt not still on doxycycline will put her on it for 5 days; monitor-it is a small area that i don't want to get larger   Time spent > 35 min;> 50% of time with patient was spent reviewing records, labs, tests and  studies, counseling and developing plan of care  Hennie Duos, MD

## 2015-11-15 ENCOUNTER — Encounter: Payer: Self-pay | Admitting: Internal Medicine

## 2015-11-15 NOTE — Progress Notes (Signed)
This encounter was created in error - please disregard.  This encounter was created in error - please disregard.

## 2015-11-15 NOTE — Progress Notes (Deleted)
Patient ID: Kaitlin Henderson, female   DOB: 05/07/1941, 75 y.o.   MRN: [id number] [id number]: [id number] Name: Kaitlin Henderson  Sex: female Age: 75 y.o. DOB: June 27, 1941  [place] #:  Facility/Room: Level Of Care: SNF Provider: Inocencio Homes Emergency Contacts: No emergency contact information on file.  Code Status:   Allergies: Codeine; Levaquin; and Sulfa antibiotics  Chief Complaint  Patient presents with  . New Admit To SNF    Admission to Nursing Facility    HPI: Patient is 75 y.o. female who  Past Medical History  Diagnosis Date  . Breast cancer, right breast ([place]) 1998    s/p chemo & XRT; right mastectomy  . Obesity   . Coronary artery disease   . Aphthous stomatitis   . Depression   . Diabetes mellitus, type 2 ([place]) 03/2010  . Hyperlipemia   . Hypothyroidism   . COPD (chronic obstructive pulmonary disease) ([place])   . Essential tremor   . NSTEMI (non-ST elevated myocardial infarction) ([place]) 01/2012    Past Surgical History  Procedure Laterality Date  . Appendectomy    . Cholecystectomy    . Abdominal hysterectomy    . Right mastectomy    . Tonsillectomy        Medication List       This list is accurate as of: 11/15/15  9:33 AM.  Always use your most recent med list.               albuterol (2.5 MG/3ML) 0.083% nebulizer solution  Commonly known as:  PROVENTIL  Take 2.5 mg by nebulization every 6 (six) hours as needed for wheezing or shortness of breath.     ALPRAZolam 0.25 MG tablet  Commonly known as:  XANAX  Take 0.25 mg by mouth 3 (three) times daily as needed for anxiety.     atorvastatin 40 MG tablet  Commonly known as:  LIPITOR  Take 40 mg by mouth daily.     budesonide 0.5 MG/2ML nebulizer solution  Commonly known as:  PULMICORT  Take 0.5 mg by nebulization 2 (two) times daily.     doxycycline 100 MG tablet  Commonly known as:  VIBRA-TABS  Take 100 mg by mouth 2 (two) times daily.     esomeprazole 40 MG capsule  Commonly known as:  NEXIUM   Take 40 mg by mouth daily at 12 noon.     insulin aspart 100 UNIT/ML injection  Commonly known as:  novoLOG  Inject 7 Units into the skin 3 (three) times daily with meals.     insulin detemir 100 UNIT/ML injection  Commonly known as:  LEVEMIR  Inject 15 Units into the skin at bedtime.     polyethylene glycol packet  Commonly known as:  MIRALAX / GLYCOLAX  Take 17 g by mouth daily as needed.     predniSONE 5 MG tablet  Commonly known as:  DELTASONE  Take 6 tabs PO day 21; 5 tabs PO day 2; 4 tabs PO day 3; 3 tabs PO day 4; 2 tabs PO day 5; 1 tab PO day 6 & 7.     tiotropium 18 MCG inhalation capsule  Commonly known as:  SPIRIVA  Place 18 mcg into inhaler and inhale daily.     valACYclovir 1000 MG tablet  Commonly known as:  VALTREX  Take every 12 hours for 10 days. Stop date: 11/24/15        Meds ordered this encounter  Medications  . valACYclovir (VALTREX) 1000 MG tablet  Sig: Take every 12 hours for 10 days. Stop date: 11/24/15  . ALPRAZolam (XANAX) 0.25 MG tablet    Sig: Take 0.25 mg by mouth 3 (three) times daily as needed for anxiety.  . budesonide (PULMICORT) 0.5 MG/2ML nebulizer solution    Sig: Take 0.5 mg by nebulization 2 (two) times daily.  [name] DISCONTD: ENOXAPARIN SODIUM [place]    Sig: Inject 40 mg into the skin daily.  [name] DISCONTD: ibuprofen (ADVIL,MOTRIN) 400 MG tablet    Sig: Take 400 mg by mouth every 8 (eight) hours as needed.  . insulin detemir (LEVEMIR) 100 UNIT/ML injection    Sig: Inject 15 Units into the skin at bedtime.  [name] DISCONTD: ipratropium-albuterol (DUONEB) 0.5-2.5 (3) MG/3ML SOLN    Sig: Take 3 mLs by nebulization every 4 (four) hours.  [name] DISCONTD: magic mouthwash SOLN    Sig: Take 10 mLs by mouth 4 (four) times daily as needed for mouth pain.  [name] doxycycline (VIBRA-TABS) 100 MG tablet    Sig: Take 100 mg by mouth 2 (two) times daily.  . insulin aspart (NOVOLOG) 100 UNIT/ML injection    Sig: Inject 7 Units into the skin 3 (three) times daily  with meals.  . polyethylene glycol (MIRALAX / GLYCOLAX) packet    Sig: Take 17 g by mouth daily as needed.  . predniSONE (DELTASONE) 5 MG tablet    Sig: Take 6 tabs PO day 21; 5 tabs PO day 2; 4 tabs PO day 3; 3 tabs PO day 4; 2 tabs PO day 5; 1 tab PO day 6 & 7.  [name] albuterol (PROVENTIL) (2.5 MG/3ML) 0.083% nebulizer solution    Sig: Take 2.5 mg by nebulization every 6 (six) hours as needed for wheezing or shortness of breath.  . tiotropium (SPIRIVA) 18 MCG inhalation capsule    Sig: Place 18 mcg into inhaler and inhale daily.  [name] atorvastatin (LIPITOR) 40 MG tablet    Sig: Take 40 mg by mouth daily.  [name] esomeprazole (NEXIUM) 40 MG capsule    Sig: Take 40 mg by mouth daily at 12 noon.    Immunization History  Administered Date(s) Administered  . PPD Test 11/10/2015    Social History  Substance Use Topics  . Smoking status: Former Smoker -- 2.50 packs/day for 54 years    Types: Cigarettes    Quit date: 09/02/2006  . Smokeless tobacco: Never Used  . Alcohol Use: Not on file    Family history is    Review of Systems  DATA OBTAINED: from patient, nurse, medical record, family member GENERAL:  no fevers, fatigue, appetite changes SKIN: No itching, rash or wounds EYES: No eye pain, redness, discharge EARS: No earache, tinnitus, change in hearing NOSE: No congestion, drainage or bleeding  MOUTH/THROAT: No mouth or tooth pain, No sore throat RESPIRATORY: No cough, wheezing, SOB CARDIAC: No chest pain, palpitations, lower extremity edema  GI: No abdominal pain, No N/V/D or constipation, No heartburn or reflux  GU: No dysuria, frequency or urgency, or incontinence  MUSCULOSKELETAL: No unrelieved bone/joint pain NEUROLOGIC: No headache, dizziness or focal weakness PSYCHIATRIC: No c/o anxiety or sadness   Filed Vitals:   11/15/15 0932  BP: 135/89  Pulse: 88  Temp: 98.1 F (36.7 C)  Resp: 20    SpO2 Readings from Last 1 Encounters:  No data found for SpO2         Physical Exam  GENERAL APPEARANCE: Alert, conversant,  No acute distress.  SKIN: No diaphoresis rash HEAD: Normocephalic, atraumatic  EYES: Conjunctiva/lids clear. Pupils round, reactive. EOMs intact.  EARS: External exam WNL, canals clear. Hearing grossly normal.  NOSE: No deformity or discharge.  MOUTH/THROAT: Lips w/o lesions  RESPIRATORY: Breathing is even, unlabored. Lung sounds are clear   CARDIOVASCULAR: Heart RRR no murmurs, rubs or gallops. No peripheral edema.   GASTROINTESTINAL: Abdomen is soft, non-tender, not distended w/ normal bowel sounds. GENITOURINARY: Bladder non tender, not distended  MUSCULOSKELETAL: No abnormal joints or musculature NEUROLOGIC:  Cranial nerves 2-12 grossly intact. Moves all extremities  PSYCHIATRIC: Mood and affect appropriate to situation, no behavioral issues  There are no active problems to display for this patient.   CBC    Component Value Date/Time   WBC 10.5 11/09/2015   HGB 15.3 11/09/2015   HCT 44 11/09/2015   PLT 228 11/09/2015    CMP     Component Value Date/Time   NA 142 11/02/2015   K 4.0 11/02/2015   BUN 15 11/02/2015   CREATININE 1.0 11/02/2015   AST 21 11/02/2015   ALT 13 11/02/2015   ALKPHOS 69 11/02/2015    No results found for: HGBA1C   Patient was never admitted.  Not all labs, radiology exams or other studies done during hospitalization come through on my EPIC note; however they are reviewed by me.    Assessment and Plan  No problem-specific assessment & plan notes found for this encounter.   Inocencio Homes, MD

## 2015-11-16 ENCOUNTER — Non-Acute Institutional Stay (SKILLED_NURSING_FACILITY): Payer: Medicare Other | Admitting: Internal Medicine

## 2015-11-16 ENCOUNTER — Encounter: Payer: Self-pay | Admitting: Internal Medicine

## 2015-11-16 DIAGNOSIS — L0390 Cellulitis, unspecified: Secondary | ICD-10-CM | POA: Insufficient documentation

## 2015-11-16 DIAGNOSIS — L03119 Cellulitis of unspecified part of limb: Secondary | ICD-10-CM

## 2015-11-16 DIAGNOSIS — J441 Chronic obstructive pulmonary disease with (acute) exacerbation: Secondary | ICD-10-CM

## 2015-11-16 DIAGNOSIS — D72829 Elevated white blood cell count, unspecified: Secondary | ICD-10-CM | POA: Diagnosis not present

## 2015-11-16 NOTE — Progress Notes (Signed)
Patient ID: SI FORBES, female   DOB: Mar 10, 1941, 75 y.o.   MRN: [id number] MRN: [id number] Name: Kaitlin Henderson  Sex: female Age: 75 y.o. DOB: Apr 02, 1941  [place] #:  Facility/Room:[facility]  109-P Level Of Care: SNF Provider: Inocencio Homes  MD Emergency Contacts: Extended Emergency Contact Information Primary Emergency Contact: Hick,Eric  [place] Mobile Phone: [phone] Relation: Significant other Secondary Emergency Contact: Joslyn [place] Phone: [phone] Relation: Neighbor  Code Status: FullCode  Allergies: Codeine; Levaquin; and Sulfonamide derivatives  Chief Complaint  Patient presents with  . Acute Visit   secondary to leukocytosis  HPI: Patient is 75 y.o. female who is being treated for  left lower extremity cellulitis with doxycycline-she also is on prednisone taper for respiratory issues. Including COPD with hospitalization for acute respiratory failure  She  appears to be doing well she is afebrile and according to nursing  And patient cellulitis lower extremities appears to be improving as well.  Lab work done on March 15 shows an elevated white count of 15.4.    Past Medical History  Diagnosis Date  . Breast cancer, right breast ([place]) 1998    s/p chemo & XRT, right mastectomy  . OBESITY   . CORONARY ARTERY DISEASE   . APHTHOUS STOMATITIS   . DEPRESSION     started sertraline 09/2010  . DIABETES, TYPE 2 dx 03/2010  . HYPERLIPIDEMIA   . Hypothyroidism   . C O P D     chronic O2 3LPM [place]  . Essential tremor   . NSTEMI (non-ST elevated myocardial infarction) ([place]) 01/2012    med mgmt  . Anginal pain ([place])   . GERD (gastroesophageal reflux disease)     Past Surgical History  Procedure Laterality Date  . Appendectomy    . Cholecystectomy    . Abdominal hysterectomy    . Right mastectomy    . Tonsillectomy    . Mastectomy        Medication List       This list is accurate as of:  11/16/15 11:59 PM.  Always use your most recent med list.               albuterol (2.5 MG/3ML) 0.083% nebulizer solution  Commonly known as:  PROVENTIL  Take 3 mLs (2.5 mg total) by nebulization every 6 (six) hours as needed for wheezing or shortness of breath.     ALPRAZolam 0.25 MG tablet  Commonly known as:  XANAX  Take 1 tablet (0.25 mg total) by mouth 3 (three) times daily as needed for anxiety.     atorvastatin 40 MG tablet  Commonly known as:  LIPITOR  Take 1 tablet (40 mg total) by mouth daily.     budesonide 0.5 MG/2ML nebulizer solution  Commonly known as:  PULMICORT  Take 2 mLs (0.5 mg total) by nebulization 2 (two) times daily.     doxycycline 100 MG tablet  Commonly known as:  VIBRA-TABS  Take 1 tablet (100 mg total) by mouth every 12 (twelve) hours.     esomeprazole 40 MG capsule  Commonly known as:  NEXIUM  TAKE 1 CAPSULE BY MOUTH ONCE A DAY     insulin aspart 100 UNIT/ML injection  Commonly known as:  novoLOG  Inject 7 Units into the skin 3 (three) times daily with meals.     insulin detemir 100 UNIT/ML injection  Commonly known as:  LEVEMIR  Inject 0.15 mLs (15 Units total)  into the skin at bedtime.     polyethylene glycol packet  Commonly known as:  MIRALAX / GLYCOLAX  Take 17 g by mouth daily as needed for moderate constipation.     predniSONE 5 MG tablet  Commonly known as:  DELTASONE  Take 6 tabs po  day1; 5 tabs po day2; 4 tabs day 3; 3 tabs po day4; 2 tabs po day5, 1 tab po day6,7     tiotropium 18 MCG inhalation capsule  Commonly known as:  SPIRIVA  Place 1 capsule (18 mcg total) into inhaler and inhale daily.          Immunization History  Administered Date(s) Administered  . Influenza Split 06/14/2011, 06/08/2012  . Influenza Whole 06/19/2009, 06/26/2010  . Influenza,inj,Quad PF,36+ Mos 05/07/2013  . Pneumococcal Polysaccharide-23 06/14/2008, 05/07/2013    Social History  Substance Use Topics  . Smoking status: Former Smoker --  2.50 packs/day for 54 years    Types: Cigarettes    Quit date: 09/02/2006  . Smokeless tobacco: Never Used     Comment: Retired. Widow/widower since 2008-lives alone. Home hospice since 06/2009 related to COPD  . Alcohol Use: No    Family history is    Review of Systems  DATA OBTAINED: from patient, nurse, medical record, family member GENERAL:  no fevers, fatigue, appetite changes SKIN: No itching, rash or wounds history of lower extremity cellulitis EYES: No eye pain, redness, discharge EARS: No earache, tinnitus, change in hearing NOSE: No congestion, drainage or bleeding  MOUTH/THROAT: No mouth or tooth pain, No sore throat RESPIRATORY: Breathing continues to improve CARDIAC: No chest pain, palpitations, lower extremity edema  GI: No abdominal pain, No N/V/D or constipation, No heartburn or reflux  GU: No dysuria, frequency or urgency, or incontinence  MUSCULOSKELETAL: No unrelieved bone/joint pain NEUROLOGIC: No headache, dizziness or focal weakness PSYCHIATRIC: No c/o anxiety or sadness   Filed Vitals:   11/16/15 2159  BP: 115/60  Pulse: 82  Temp: 97.8 F (36.6 C)  Resp: 16    SpO2 Readings from Last 1 Encounters:  11/09/15 87%        Physical Exam  GENERAL APPEARANCE: Alert, conversant,  No acute distress.  SKIN: No diaphoresis rash erythema left lower extremity appears to be improving minimal warmth HEAD: Normocephalic, atraumatic  EYES: Conjunctiva/lids clear. Pupils round, reactive. EOMs intact.  EARS: External exam WNL, canals clear. Hearing grossly normal.  NOSE: No deformity or discharge.  MOUTH/THROAT: Lips w/o lesions  RESPIRATORY: Breathing is even, unlabored. Lung sounds are clear  but shallow CARDIOVASCULAR: Heart RRR no murmurs, rubs or gallops. No peripheral edema.   GASTROINTESTINAL: Abdomen is soft, non-tender, not distended w/ normal bowel sounds. MUSCULOSKELETAL: No abnormal joints or musculature is ambulatory but quite weak NEUROLOGIC:   Cranial nerves 2-12 grossly intact. Moves all extremities  PSYCHIATRIC: Mood and affect appropriate to situation, no behavioral issues  Patient Active Problem List   Diagnosis Date Noted  . Leukocytosis 11/16/2015  . Cellulitis 11/16/2015  . 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 COPD exacerbation ([place])   . Acute respiratory failure with hypoxia ([place])   . SOB (shortness of breath) 11/02/2015  . Non-compliant behavior 12/05/2014  . Decreased mobility 08/15/2014  . Benign essential tremor 09/05/2011  . Breast cancer, right breast ([place])   . DEPRESSION 09/25/2010  . HAIR LOSS 06/26/2010  . Type II or unspecified type diabetes mellitus with neurological manifestations, not stated as uncontrolled 03/14/2010  . POLYURIA 03/13/2010  . APHTHOUS STOMATITIS  08/23/2009  . CHRONIC RESPIRATORY FAILURE 08/18/2007  . HYPERLIPIDEMIA 08/17/2007  . OBESITY 08/17/2007  . CORONARY ARTERY DISEASE 08/17/2007  . 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  11/15/2015.  WBC 15.4 hemoglobin 15.4 platelets 235.  Sodium 143 potassium 3.9 BUN 21 and 0.78    Component Value Date/Time   WBC 10.5 11/09/2015 0652   RBC 4.65 11/09/2015 0652   HGB 15.3 11/09/2015 0652   HCT 44.3 11/09/2015 0652   PLT 228 11/09/2015 0652   MCV 95.2 11/09/2015 0652   LYMPHSABS 1.9 11/02/2015 1602   MONOABS 0.6 11/02/2015 1602   EOSABS 0.1 11/02/2015 1602   BASOSABS 0.0 11/02/2015 1602    CMP     Component Value Date/Time   NA 144 11/03/2015 0637   K 4.6 11/03/2015 0637   CL 103 11/03/2015 0637   CO2 32 11/03/2015 0637   GLUCOSE 187* 11/03/2015 0637   BUN 15 11/03/2015 0637   CREATININE 0.98 11/09/2015 0652   CALCIUM 8.7* 11/03/2015 0637   PROT 7.4 11/02/2015 1602   ALBUMIN 3.6 11/02/2015 1602   AST 21 11/02/2015 1602   ALT 13* 11/02/2015 1602   ALKPHOS 69 11/02/2015 1602   BILITOT 1.0 11/02/2015 1602   GFRNONAA 55* 11/09/2015 0652   GFRAA >60 11/09/2015 0652    Lab Results  Component  Value Date   HGBA1C 6.0 07/14/2013     Dg Chest Port 1 View  11/07/2015  CLINICAL DATA:  PICC line placement EXAM: PORTABLE CHEST 1 VIEW COMPARISON:  11/06/2015 FINDINGS: There is a right-sided PICC line with the tip projecting over the cavoatrial junction. There is no focal parenchymal opacity. There is no pleural effusion or pneumothorax. The heart and mediastinal contours are unremarkable. The osseous structures are unremarkable. IMPRESSION: 1. Right-sided PICC line with the tip projecting over the cavoatrial junction. Electronically Signed   By: Kathreen Devoid   On: 11/07/2015 13:18         Assessment and Plan  Leukocytosis-could be numerous etiologies including recent cellulitis as well as prednisone-clinically she appears to be improving she is afebrile cellulitis appears improved she is completing a prednisone taper-at this point will monitor update a CBC with differential-.  #2 left lower extreme he cellulitis this appears to be resolving she is on doxycycline.  #3 history of COPD with respiratory failure this appears to have improved fairly significantly as well she is gaining strength she does continue on the prednisone taper-does have nebulizers as needed every 6 hours continues on Spiriva as well   [id number]

## 2015-11-25 ENCOUNTER — Encounter: Payer: Self-pay | Admitting: Internal Medicine

## 2015-11-25 DIAGNOSIS — R911 Solitary pulmonary nodule: Secondary | ICD-10-CM | POA: Insufficient documentation

## 2015-11-25 DIAGNOSIS — F419 Anxiety disorder, unspecified: Secondary | ICD-10-CM | POA: Insufficient documentation

## 2015-11-25 DIAGNOSIS — L03116 Cellulitis of left lower limb: Secondary | ICD-10-CM | POA: Insufficient documentation

## 2015-11-25 DIAGNOSIS — K219 Gastro-esophageal reflux disease without esophagitis: Secondary | ICD-10-CM | POA: Insufficient documentation

## 2015-11-25 NOTE — Assessment & Plan Note (Signed)
Levemir increased to 15 units at night with 7 units of NovoLog prior to meals. SNF - Check fingersticks before every meal seen daily at bedtime sugars should come down as  taper the steroids.

## 2015-11-25 NOTE — Assessment & Plan Note (Signed)
SNF - cont doxycycline for 10 more days

## 2015-11-25 NOTE — Assessment & Plan Note (Signed)
SNF - cont nexium 40 mg daily

## 2015-11-25 NOTE — Assessment & Plan Note (Signed)
SNF - not stated as uncontrolled;cont lipitor 40 mg daily

## 2015-11-25 NOTE — Assessment & Plan Note (Signed)
Patient was admitted and was not moving much air at all. She was started on antibiotics and high-dose steroids. During the hospital course she required high flow nasal cannula as much as 90% oxygen. The patient was tapered off of high flow nasal cannula and is on regular nasal cannula at 5 L. She normally wears 3 L at home prior to hospitalization. Can continue to try to taper oxygen. The patient has rescinded her DO NOT RESUSCITATE status and is now a full code.

## 2015-11-25 NOTE — Assessment & Plan Note (Signed)
2.1cm  spiculated lung nodule. Patient is no condition to have a lung biopsy.

## 2015-11-25 NOTE — Assessment & Plan Note (Signed)
The patient was on Solu-Medrol the entire hospital course and switch over to prednisone taper; continue albuterol nebulizers, budesonide nebulizers and Spiriva. Patient was on triple antibiotics SNF - cont  doxycycline , 5L O2 and spiriva, pulmicort

## 2015-11-25 NOTE — Assessment & Plan Note (Signed)
SNF - cont xanax 0.25 mg TID

## 2015-11-26 ENCOUNTER — Encounter: Payer: Self-pay | Admitting: Internal Medicine

## 2015-11-26 DIAGNOSIS — L03115 Cellulitis of right lower limb: Secondary | ICD-10-CM | POA: Insufficient documentation

## 2015-11-26 NOTE — Assessment & Plan Note (Signed)
If pt not still on doxycycline will put her on it for 5 days; monitor-it is a small area that i don't want to get larger

## 2015-11-26 NOTE — Assessment & Plan Note (Signed)
Pt's CrCl is 51; she has been put on valtrex 1000 mg q 12 for 10 days; cont generous use of barrier cream

## 2015-11-28 ENCOUNTER — Non-Acute Institutional Stay (SKILLED_NURSING_FACILITY): Payer: Medicare Other | Admitting: Internal Medicine

## 2015-11-28 ENCOUNTER — Encounter: Payer: Self-pay | Admitting: Internal Medicine

## 2015-11-28 DIAGNOSIS — F419 Anxiety disorder, unspecified: Secondary | ICD-10-CM | POA: Diagnosis not present

## 2015-11-28 DIAGNOSIS — J9611 Chronic respiratory failure with hypoxia: Secondary | ICD-10-CM

## 2015-11-28 DIAGNOSIS — L03115 Cellulitis of right lower limb: Secondary | ICD-10-CM | POA: Diagnosis not present

## 2015-11-28 DIAGNOSIS — J9621 Acute and chronic respiratory failure with hypoxia: Secondary | ICD-10-CM | POA: Diagnosis not present

## 2015-11-28 DIAGNOSIS — J441 Chronic obstructive pulmonary disease with (acute) exacerbation: Secondary | ICD-10-CM | POA: Diagnosis not present

## 2015-11-28 DIAGNOSIS — L03116 Cellulitis of left lower limb: Secondary | ICD-10-CM

## 2015-11-28 DIAGNOSIS — A609 Anogenital herpesviral infection, unspecified: Secondary | ICD-10-CM

## 2015-11-28 DIAGNOSIS — E1149 Type 2 diabetes mellitus with other diabetic neurological complication: Secondary | ICD-10-CM | POA: Diagnosis not present

## 2015-11-28 DIAGNOSIS — E785 Hyperlipidemia, unspecified: Secondary | ICD-10-CM

## 2015-11-28 DIAGNOSIS — A6009 Herpesviral infection of other urogenital tract: Secondary | ICD-10-CM

## 2015-11-28 DIAGNOSIS — R911 Solitary pulmonary nodule: Secondary | ICD-10-CM

## 2015-11-28 DIAGNOSIS — K219 Gastro-esophageal reflux disease without esophagitis: Secondary | ICD-10-CM

## 2015-11-28 NOTE — Progress Notes (Addendum)
MRN: [id number] Name: Kaitlin Henderson  Sex: female Age: 75 y.o. DOB: [birth year]  [place] #: [facility] Facility/Room:109 Level Of Care: SNF Provider: Inocencio Homes D Emergency Contacts: Extended Emergency Contact Information Primary Emergency Contact: Hick,Eric  [place] Mobile Phone: [phone] Relation: Significant other Secondary Emergency Contact: Joslyn [place] Phone: [phone] Relation: Neighbor  Code Status:   Allergies: Codeine; Levaquin; and Sulfonamide derivatives  Chief Complaint  Patient presents with  . Discharge Note    HPI: Patient is 75 y.o. 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 She was admitted to the hospital from 3/2-9 where she was treated for acute on chronic respiratory failure and an acute COPD exacerbation. Hospital course was complicated by the finding of a spiculated lung nodule presumed to be malignant and by a LLE cellulitis. Pt is admitted to SNF on 3/9 because she refused to go to Hospice home. Pt had a mild exacerbation of COPD while at SNF and herpes genitalia treated with Valtrex. Pt is now ready to be d/c to home.  Past Medical History  Diagnosis Date  . Breast cancer, right breast ([place]) 1998    s/p chemo & XRT, right mastectomy  . OBESITY   . CORONARY ARTERY DISEASE   . APHTHOUS STOMATITIS   . DEPRESSION     started sertraline 09/2010  . DIABETES, TYPE 2 dx 03/2010  . HYPERLIPIDEMIA   . Hypothyroidism   . C O P D     chronic O2 3LPM [place]  . Essential tremor   . NSTEMI (non-ST elevated myocardial infarction) ([place]) 01/2012    med mgmt  . Anginal pain ([place])   . GERD (gastroesophageal reflux disease)     Past Surgical History  Procedure Laterality Date  . Appendectomy    . Cholecystectomy    . Abdominal hysterectomy    . Right mastectomy    . Tonsillectomy    . Mastectomy        Medication List       This list is  accurate as of: 11/28/15  5:22 PM.  Always use your most recent med list.               albuterol (2.5 MG/3ML) 0.083% nebulizer solution  Commonly known as:  PROVENTIL  Take 3 mLs (2.5 mg total) by nebulization every 6 (six) hours as needed for wheezing or shortness of breath.     ALPRAZolam 0.25 MG tablet  Commonly known as:  XANAX  Take 1 tablet (0.25 mg total) by mouth 3 (three) times daily as needed for anxiety.     atorvastatin 40 MG tablet  Commonly known as:  LIPITOR  Take 1 tablet (40 mg total) by mouth daily.     budesonide 0.5 MG/2ML nebulizer solution  Commonly known as:  PULMICORT  Take 2 mLs (0.5 mg total) by nebulization 2 (two) times daily.     esomeprazole 40 MG capsule  Commonly known as:  NEXIUM  TAKE 1 CAPSULE BY MOUTH ONCE A DAY     insulin aspart 100 UNIT/ML injection  Commonly known as:  novoLOG  Inject 7 Units into the skin 3 (three) times daily with meals.     insulin detemir 100 UNIT/ML injection  Commonly known as:  LEVEMIR  Inject 0.15 mLs (15 Units total) into the skin at bedtime.     polyethylene glycol packet  Commonly known as:  MIRALAX /  GLYCOLAX  Take 17 g by mouth daily as needed for moderate constipation.     tiotropium 18 MCG inhalation capsule  Commonly known as:  SPIRIVA  Place 1 capsule (18 mcg total) into inhaler and inhale daily.        No orders of the defined types were placed in this encounter.    Immunization History  Administered Date(s) Administered  . Influenza Split 06/14/2011, 06/08/2012  . Influenza Whole 06/19/2009, 06/26/2010  . Influenza,inj,Quad PF,36+ Mos 05/07/2013  . Pneumococcal Polysaccharide-23 06/14/2008, 05/07/2013    Social History  Substance Use Topics  . Smoking status: Former Smoker -- 2.50 packs/day for 54 years    Types: Cigarettes    Quit date: 09/02/2006  . Smokeless tobacco: Never Used     Comment: Retired. Widow/widower since 2008-lives alone. Home hospice since 06/2009 related to  COPD  . Alcohol Use: No    Filed Vitals:   11/28/15 1326  BP: 135/89  Pulse: 104  Temp: 97.3 F (36.3 C)  Resp: 18    Physical Exam  GENERAL APPEARANCE: Alert, conversant. No acute distress.  HEENT: Unremarkable. RESPIRATORY: Breathing is even, unlabored. Lung sounds are diffusely decreased.  CARDIOVASCULAR: Heart RRR no murmurs, rubs or gallops. No peripheral edema.  GASTROINTESTINAL: Abdomen is soft, non-tender, not distended w/ normal bowel sounds.  NEUROLOGIC: Cranial nerves 2-12 grossly intact. Moves all extremities  Patient Active Problem List   Diagnosis Date Noted  . Cellulitis of right lower extremity 11/26/2015  . Lung nodule 11/25/2015  . Anxiety 11/25/2015  . GERD (gastroesophageal reflux disease) 11/25/2015  . Cellulitis of left lower extremity 11/25/2015  . Leukocytosis 11/16/2015  . Cellulitis 11/16/2015  . 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 COPD exacerbation ([place])   . Acute on chronic respiratory failure with hypoxia ([place])   . SOB (shortness of breath) 11/02/2015  . Non-compliant behavior 12/05/2014  . Decreased mobility 08/15/2014  . Benign essential tremor 09/05/2011  . Breast cancer, right breast ([place])   . DEPRESSION 09/25/2010  . HAIR LOSS 06/26/2010  . Type 2 diabetes mellitus with neurological complications ([place]) [id number]  . POLYURIA 03/13/2010  . APHTHOUS STOMATITIS 08/23/2009  . CHRONIC RESPIRATORY FAILURE 08/18/2007  . Hyperlipidemia 08/17/2007  . OBESITY 08/17/2007  . CORONARY ARTERY DISEASE 08/17/2007  . COPD with emphysema, [place]  08/17/2007    CBC    Component Value Date/Time   WBC 10.5 11/09/2015 0652   RBC 4.65 11/09/2015 0652   HGB 15.3 11/09/2015 0652   HCT 44.3 11/09/2015 0652   PLT 228 11/09/2015 0652   MCV 95.2 11/09/2015 0652   LYMPHSABS 1.9 11/02/2015 1602   MONOABS 0.6 11/02/2015 1602   EOSABS 0.1 11/02/2015 1602   BASOSABS 0.0 11/02/2015 1602    CMP     Component Value Date/Time   NA 144  11/03/2015 0637   K 4.6 11/03/2015 0637   CL 103 11/03/2015 0637   CO2 32 11/03/2015 0637   GLUCOSE 187* 11/03/2015 0637   BUN 15 11/03/2015 0637   CREATININE 0.98 11/09/2015 0652   CALCIUM 8.7* 11/03/2015 0637   PROT 7.4 11/02/2015 1602   ALBUMIN 3.6 11/02/2015 1602   AST 21 11/02/2015 1602   ALT 13* 11/02/2015 1602   ALKPHOS 69 11/02/2015 1602   BILITOT 1.0 11/02/2015 1602   GFRNONAA 55* 11/09/2015 0652   GFRAA >60 11/09/2015 0652    Assessment and Plan  Pt is d/c to home with HH/OT/PT/nursing. Medications ave been reconciled and rx's written. Pt  will need a nebulizer machine at home. She has been getting nebs at SNF.  Time spent > 30 min;> 50% of time with patient was spent reviewing records, labs, tests and studies, counseling and developing plan of care  Hennie Duos, MD

## 2015-12-05 ENCOUNTER — Ambulatory Visit: Payer: Medicare Other | Admitting: Internal Medicine

## 2015-12-12 ENCOUNTER — Inpatient Hospital Stay
Admission: EM | Admit: 2015-12-12 | Discharge: 2015-12-14 | DRG: 603 | Disposition: A | Discharge status: Home Health | Payer: Medicare Other | Attending: Internal Medicine | Admitting: Internal Medicine

## 2015-12-12 ENCOUNTER — Emergency Department: Payer: Medicare Other

## 2015-12-12 ENCOUNTER — Encounter: Payer: Self-pay | Admitting: Emergency Medicine

## 2015-12-12 DIAGNOSIS — Z79899 Other long term (current) drug therapy: Secondary | ICD-10-CM | POA: Diagnosis not present

## 2015-12-12 DIAGNOSIS — L03116 Cellulitis of left lower limb: Principal | ICD-10-CM | POA: Diagnosis present

## 2015-12-12 DIAGNOSIS — Z9221 Personal history of antineoplastic chemotherapy: Secondary | ICD-10-CM | POA: Diagnosis not present

## 2015-12-12 DIAGNOSIS — I10 Essential (primary) hypertension: Secondary | ICD-10-CM | POA: Diagnosis present

## 2015-12-12 DIAGNOSIS — Z7401 Bed confinement status: Secondary | ICD-10-CM | POA: Diagnosis not present

## 2015-12-12 DIAGNOSIS — J9610 Chronic respiratory failure, unspecified whether with hypoxia or hypercapnia: Secondary | ICD-10-CM | POA: Diagnosis present

## 2015-12-12 DIAGNOSIS — F329 Major depressive disorder, single episode, unspecified: Secondary | ICD-10-CM | POA: Diagnosis present

## 2015-12-12 DIAGNOSIS — Z6836 Body mass index (BMI) 36.0-36.9, adult: Secondary | ICD-10-CM

## 2015-12-12 DIAGNOSIS — Z9011 Acquired absence of right breast and nipple: Secondary | ICD-10-CM | POA: Diagnosis not present

## 2015-12-12 DIAGNOSIS — I2510 Atherosclerotic heart disease of native coronary artery without angina pectoris: Secondary | ICD-10-CM | POA: Diagnosis present

## 2015-12-12 DIAGNOSIS — Z882 Allergy status to sulfonamides status: Secondary | ICD-10-CM

## 2015-12-12 DIAGNOSIS — Z853 Personal history of malignant neoplasm of breast: Secondary | ICD-10-CM | POA: Diagnosis not present

## 2015-12-12 DIAGNOSIS — Z9981 Dependence on supplemental oxygen: Secondary | ICD-10-CM

## 2015-12-12 DIAGNOSIS — Z7951 Long term (current) use of inhaled steroids: Secondary | ICD-10-CM | POA: Diagnosis not present

## 2015-12-12 DIAGNOSIS — R911 Solitary pulmonary nodule: Secondary | ICD-10-CM | POA: Diagnosis present

## 2015-12-12 DIAGNOSIS — E876 Hypokalemia: Secondary | ICD-10-CM | POA: Diagnosis not present

## 2015-12-12 DIAGNOSIS — E785 Hyperlipidemia, unspecified: Secondary | ICD-10-CM | POA: Diagnosis present

## 2015-12-12 DIAGNOSIS — Z794 Long term (current) use of insulin: Secondary | ICD-10-CM

## 2015-12-12 DIAGNOSIS — Z9049 Acquired absence of other specified parts of digestive tract: Secondary | ICD-10-CM | POA: Diagnosis not present

## 2015-12-12 DIAGNOSIS — R609 Edema, unspecified: Secondary | ICD-10-CM

## 2015-12-12 DIAGNOSIS — Z885 Allergy status to narcotic agent status: Secondary | ICD-10-CM

## 2015-12-12 DIAGNOSIS — E039 Hypothyroidism, unspecified: Secondary | ICD-10-CM | POA: Diagnosis present

## 2015-12-12 DIAGNOSIS — E119 Type 2 diabetes mellitus without complications: Secondary | ICD-10-CM | POA: Diagnosis present

## 2015-12-12 DIAGNOSIS — Z888 Allergy status to other drugs, medicaments and biological substances status: Secondary | ICD-10-CM | POA: Diagnosis not present

## 2015-12-12 DIAGNOSIS — Z90710 Acquired absence of both cervix and uterus: Secondary | ICD-10-CM

## 2015-12-12 DIAGNOSIS — L03119 Cellulitis of unspecified part of limb: Secondary | ICD-10-CM | POA: Diagnosis present

## 2015-12-12 DIAGNOSIS — Z87891 Personal history of nicotine dependence: Secondary | ICD-10-CM | POA: Diagnosis not present

## 2015-12-12 DIAGNOSIS — J449 Chronic obstructive pulmonary disease, unspecified: Secondary | ICD-10-CM | POA: Diagnosis present

## 2015-12-12 DIAGNOSIS — I252 Old myocardial infarction: Secondary | ICD-10-CM | POA: Diagnosis not present

## 2015-12-12 DIAGNOSIS — Z881 Allergy status to other antibiotic agents status: Secondary | ICD-10-CM | POA: Diagnosis not present

## 2015-12-12 DIAGNOSIS — K219 Gastro-esophageal reflux disease without esophagitis: Secondary | ICD-10-CM | POA: Diagnosis present

## 2015-12-12 DIAGNOSIS — F419 Anxiety disorder, unspecified: Secondary | ICD-10-CM | POA: Diagnosis present

## 2015-12-12 DIAGNOSIS — E669 Obesity, unspecified: Secondary | ICD-10-CM | POA: Diagnosis present

## 2015-12-12 LAB — GLUCOSE, CAPILLARY
GLUCOSE-CAPILLARY: 89 mg/dL (ref 65–99)
Glucose-Capillary: 148 mg/dL — ABNORMAL HIGH (ref 65–99)
Glucose-Capillary: 136 mg/dL — ABNORMAL HIGH (ref 65–99)

## 2015-12-12 LAB — TROPONIN I: Troponin I: <0.03 ng/mL (ref <0.031)

## 2015-12-12 LAB — COMPREHENSIVE METABOLIC PANEL
ALBUMIN: 3.1 g/dL — AB (ref 3.5–5.0)
ALT: 12 U/L — ABNORMAL LOW (ref 14–54)
ANION GAP: 5 (ref 5–15)
AST: 17 U/L (ref 15–41)
Alkaline Phosphatase: 59 U/L (ref 38–126)
BILIRUBIN TOTAL: 0.5 mg/dL (ref 0.3–1.2)
BUN: 10 mg/dL (ref 6–20)
CHLORIDE: 105 mmol/L (ref 101–111)
CO2: 32 mmol/L (ref 22–32)
Calcium: 8.6 mg/dL — ABNORMAL LOW (ref 8.9–10.3)
Creatinine, Ser: 0.73 mg/dL (ref 0.44–1.00)
GFR calc Af Amer: >60 mL/min (ref >60)
GFR calc non Af Amer: >60 mL/min (ref >60)
GLUCOSE: 128 mg/dL — AB (ref 65–99)
POTASSIUM: 4.1 mmol/L (ref 3.5–5.1)
Sodium: 142 mmol/L (ref 135–145)
TOTAL PROTEIN: 6.4 g/dL — AB (ref 6.5–8.1)

## 2015-12-12 LAB — CBC WITH DIFFERENTIAL/PLATELET
BASOS PCT: 1 %
Basophils Absolute: 0.1 10*3/uL (ref 0–0.1)
EOS ABS: 0.1 10*3/uL (ref 0–0.7)
Eosinophils Relative: 1 %
HEMATOCRIT: 36.5 % (ref 35.0–47.0)
HEMOGLOBIN: 12.3 g/dL (ref 12.0–16.0)
LYMPHS ABS: 1.5 10*3/uL (ref 1.0–3.6)
Lymphocytes Relative: 18 %
MCH: 32.4 pg (ref 26.0–34.0)
MCHC: 33.8 g/dL (ref 32.0–36.0)
MCV: 95.6 fL (ref 80.0–100.0)
Monocytes Absolute: 0.9 10*3/uL (ref 0.2–0.9)
Monocytes Relative: 11 %
NEUTROS ABS: 5.8 10*3/uL (ref 1.4–6.5)
NEUTROS PCT: 69 %
Platelets: 244 10*3/uL (ref 150–440)
RBC: 3.81 MIL/uL (ref 3.80–5.20)
RDW: 14.3 % (ref 11.5–14.5)
WBC: 8.4 10*3/uL (ref 3.6–11.0)

## 2015-12-12 LAB — BRAIN NATRIURETIC PEPTIDE: B NATRIURETIC PEPTIDE 5: 175 pg/mL — AB (ref 0.0–100.0)

## 2015-12-12 LAB — MRSA PCR SCREENING: MRSA BY PCR: NEGATIVE

## 2015-12-12 MED ORDER — ALPRAZOLAM 0.25 MG PO TABS: 0.1250 mg | ORAL_TABLET | Freq: Three times a day (TID) | ORAL | Status: DC | PRN

## 2015-12-12 MED ORDER — TIOTROPIUM BROMIDE MONOHYDRATE 18 MCG IN CAPS
18.0000 ug | ORAL_CAPSULE | Freq: Every day | RESPIRATORY_TRACT | Status: DC
  Administered 2015-12-12 – 2015-12-14 (×3): 18 ug via RESPIRATORY_TRACT
  Filled 2015-12-12: qty 5

## 2015-12-12 MED ORDER — ONDANSETRON HCL 4 MG PO TABS: 4.0000 mg | ORAL_TABLET | Freq: Four times a day (QID) | ORAL | Status: DC | PRN

## 2015-12-12 MED ORDER — INSULIN ASPART 100 UNIT/ML ~~LOC~~ SOLN: 0.0000 [IU] | Freq: Every day | SUBCUTANEOUS | Status: DC

## 2015-12-12 MED ORDER — MOMETASONE FURO-FORMOTEROL FUM 200-5 MCG/ACT IN AERO
2.0000 | INHALATION_SPRAY | Freq: Two times a day (BID) | RESPIRATORY_TRACT | Status: DC
Start: 2015-12-12 — End: 2015-12-14
  Administered 2015-12-12 – 2015-12-14 (×4): 2 via RESPIRATORY_TRACT
  Filled 2015-12-12: qty 8.8

## 2015-12-12 MED ORDER — ENOXAPARIN SODIUM 40 MG/0.4ML ~~LOC~~ SOLN
40.0000 mg | SUBCUTANEOUS | Status: DC
  Administered 2015-12-13: 40 mg via SUBCUTANEOUS
  Filled 2015-12-12: qty 0.4

## 2015-12-12 MED ORDER — FUROSEMIDE 40 MG PO TABS
40.0000 mg | ORAL_TABLET | Freq: Two times a day (BID) | ORAL | Status: DC
  Administered 2015-12-12 (×2): 40 mg via ORAL
  Filled 2015-12-12 (×2): qty 1

## 2015-12-12 MED ORDER — HYDROCODONE-ACETAMINOPHEN 5-325 MG PO TABS: 1.0000 | ORAL_TABLET | ORAL | Status: DC | PRN

## 2015-12-12 MED ORDER — SODIUM CHLORIDE 0.9% FLUSH
3.0000 mL | Freq: Two times a day (BID) | INTRAVENOUS | Status: DC
Start: 2015-12-12 — End: 2015-12-14
  Administered 2015-12-12 – 2015-12-14 (×4): 3 mL via INTRAVENOUS

## 2015-12-12 MED ORDER — VANCOMYCIN HCL IN DEXTROSE 750-5 MG/150ML-% IV SOLN
750.0000 mg | Freq: Two times a day (BID) | INTRAVENOUS | Status: DC
  Administered 2015-12-12 – 2015-12-14 (×4): 750 mg via INTRAVENOUS
  Filled 2015-12-12 (×6): qty 150

## 2015-12-12 MED ORDER — INSULIN ASPART 100 UNIT/ML ~~LOC~~ SOLN
0.0000 [IU] | Freq: Three times a day (TID) | SUBCUTANEOUS | Status: DC
  Administered 2015-12-12 – 2015-12-13 (×2): 2 [IU] via SUBCUTANEOUS
  Administered 2015-12-13: 3 [IU] via SUBCUTANEOUS
  Administered 2015-12-14: 2 [IU] via SUBCUTANEOUS
  Filled 2015-12-12: qty 3
  Filled 2015-12-12 (×2): qty 2
  Filled 2015-12-12: qty 5
  Filled 2015-12-12: qty 2

## 2015-12-12 MED ORDER — ACETAMINOPHEN 650 MG RE SUPP: 650.0000 mg | Freq: Four times a day (QID) | RECTAL | Status: DC | PRN

## 2015-12-12 MED ORDER — SODIUM CHLORIDE 0.9% FLUSH
3.0000 mL | Freq: Two times a day (BID) | INTRAVENOUS | Status: DC
  Administered 2015-12-14: 3 mL via INTRAVENOUS

## 2015-12-12 MED ORDER — ACETAMINOPHEN 325 MG PO TABS
650.0000 mg | ORAL_TABLET | Freq: Four times a day (QID) | ORAL | Status: DC | PRN
  Filled 2015-12-12: qty 2

## 2015-12-12 MED ORDER — ONDANSETRON HCL 4 MG/2ML IJ SOLN: 4.0000 mg | Freq: Four times a day (QID) | INTRAMUSCULAR | Status: DC | PRN

## 2015-12-12 MED ORDER — SODIUM CHLORIDE 0.9 % IV SOLN: 250.0000 mL | INTRAVENOUS | Status: DC | PRN

## 2015-12-12 MED ORDER — ALBUTEROL SULFATE (2.5 MG/3ML) 0.083% IN NEBU: 2.5000 mg | INHALATION_SOLUTION | Freq: Four times a day (QID) | RESPIRATORY_TRACT | Status: DC | PRN

## 2015-12-12 MED ORDER — PIPERACILLIN-TAZOBACTAM 3.375 G IVPB
3.3750 g | Freq: Three times a day (TID) | INTRAVENOUS | Status: DC
  Administered 2015-12-12 – 2015-12-14 (×6): 3.375 g via INTRAVENOUS
  Filled 2015-12-12 (×8): qty 50

## 2015-12-12 MED ORDER — VANCOMYCIN HCL IN DEXTROSE 1-5 GM/200ML-% IV SOLN
1000.0000 mg | Freq: Once | INTRAVENOUS | Status: AC
  Administered 2015-12-12: 1000 mg via INTRAVENOUS
  Filled 2015-12-12: qty 200

## 2015-12-12 MED ORDER — ASPIRIN 325 MG PO TABS
325.0000 mg | ORAL_TABLET | Freq: Every day | ORAL | Status: DC
  Administered 2015-12-13 – 2015-12-14 (×2): 325 mg via ORAL
  Filled 2015-12-12 (×2): qty 1

## 2015-12-12 MED ORDER — VANCOMYCIN HCL IN DEXTROSE 1-5 GM/200ML-% IV SOLN: 1000.0000 mg | Freq: Once | INTRAVENOUS | Status: DC

## 2015-12-12 MED ORDER — PIPERACILLIN-TAZOBACTAM 3.375 G IVPB 30 MIN: 3.3750 g | Freq: Once | INTRAVENOUS | Status: DC

## 2015-12-12 MED ORDER — POLYETHYLENE GLYCOL 3350 17 G PO PACK: 17.0000 g | PACK | Freq: Every day | ORAL | Status: DC | PRN

## 2015-12-12 MED ORDER — DOCUSATE SODIUM 100 MG PO CAPS
100.0000 mg | ORAL_CAPSULE | Freq: Two times a day (BID) | ORAL | Status: DC
  Administered 2015-12-12 – 2015-12-14 (×3): 100 mg via ORAL
  Filled 2015-12-12 (×4): qty 1

## 2015-12-12 NOTE — ED Notes (Signed)
Pt to ed with c/o bilat swelling in feet and legs x several weeks.

## 2015-12-12 NOTE — H&P (Signed)
[facility] [place] NAME: Kaitlin Henderson    MR#:  [id number]  DATE OF BIRTH:  [birth year]  DATE OF ADMISSION:  12/12/2015  PRIMARY CARE PHYSICIAN: Gwendolyn Grant, MD   REQUESTING/REFERRING PHYSICIAN: Dr. Cinda Quest  CHIEF COMPLAINT:   Chief Complaint  Patient presents with  . Leg Swelling    HISTORY OF PRESENT ILLNESS:  Kaitlin Henderson  is a 75 y.o. female with a known history of hypertension, COPD, chronic respiratory failure, diastolic CHF presents to the emergency room complaining of worsening lower extremity swelling along with redness and pain. Patient does not remember when this started. She does have chronic edema due to CHF. She is bed bound and uses a wheelchair. Uses 3 L oxygen at baseline. No recent antibiotic use. Has had some orthopnea. Here in the emergency room patient has been noticed to have extensive erythema in the left lower extremity and mild erythema in the right lower extremity is being admitted to the hospitalist service. X-ray showed pulmonary edema.  PAST MEDICAL HISTORY:   Past Medical History  Diagnosis Date  . Breast cancer, right breast ([place]) 1998    s/p chemo & XRT, right mastectomy  . OBESITY   . CORONARY ARTERY DISEASE   . APHTHOUS STOMATITIS   . DEPRESSION     started sertraline 09/2010  . DIABETES, TYPE 2 dx 03/2010  . HYPERLIPIDEMIA   . Hypothyroidism   . C O P D     chronic O2 3LPM [place]  . Essential tremor   . NSTEMI (non-ST elevated myocardial infarction) ([place]) 01/2012    med mgmt  . Anginal pain ([place])   . GERD (gastroesophageal reflux disease)     PAST SURGICAL HISTORY:   Past Surgical History  Procedure Laterality Date  . Appendectomy    . Cholecystectomy    . Abdominal hysterectomy    . Right mastectomy    . Tonsillectomy    . Mastectomy      SOCIAL HISTORY:   Social History  Substance Use Topics  . Smoking status: Former Smoker -- 2.50 packs/day for 54 years    Types:  Cigarettes    Quit date: 09/02/2006  . Smokeless tobacco: Never Used     Comment: Retired. Widow/widower since 2008-lives alone. Home hospice since 06/2009 related to COPD  . Alcohol Use: No    FAMILY HISTORY:   Family History  Problem Relation Age of Onset  . Diabetes Neg Hx   . Cancer Neg Hx     DRUG ALLERGIES:   Allergies  Allergen Reactions  . Codeine     REACTION: makes pt pass out  . Levaquin [Levofloxacin In D5w] Other (See Comments)    redness  . Sulfonamide Derivatives     REACTION: edema    REVIEW OF SYSTEMS:   Review of Systems  Constitutional: Positive for malaise/fatigue. Negative for fever, chills and weight loss.  HENT: Negative for hearing loss and nosebleeds.   Eyes: Negative for blurred vision, double vision and pain.  Respiratory: Positive for cough and shortness of breath. Negative for hemoptysis, sputum production and wheezing.   Cardiovascular: Positive for orthopnea and leg swelling. Negative for chest pain and palpitations.  Gastrointestinal: Negative for nausea, vomiting, abdominal pain, diarrhea and constipation.  Genitourinary: Negative for dysuria and hematuria.  Musculoskeletal: Positive for back pain. Negative for myalgias and falls.  Skin: Positive for rash.  Neurological: Positive for weakness. Negative for dizziness, tremors, sensory change, speech change, focal  weakness, seizures and headaches.  Endo/Heme/Allergies: Does not bruise/bleed easily.  Psychiatric/Behavioral: Negative for depression and memory loss. The patient is not nervous/anxious.     MEDICATIONS AT HOME:   Prior to Admission medications   Medication Sig Start Date End Date Taking? Authorizing Provider  albuterol (PROVENTIL) (2.5 MG/3ML) 0.083% nebulizer solution Take 3 mLs (2.5 mg total) by nebulization every 6 (six) hours as needed for wheezing or shortness of breath. 11/09/15  Yes Richard Leslye Peer, MD  ALPRAZolam Duanne Moron) 0.25 MG tablet Take 1 tablet (0.25 mg total) by  mouth 3 (three) times daily as needed for anxiety. Patient taking differently: Take 0.125 mg by mouth 3 (three) times daily as needed for anxiety.  11/09/15  Yes Loletha Grayer, MD  aspirin 325 MG tablet Take 650 mg by mouth at bedtime.   Yes Historical Provider, MD  tiotropium (SPIRIVA) 18 MCG inhalation capsule Place 1 capsule (18 mcg total) into inhaler and inhale daily. 11/09/15  Yes Loletha Grayer, MD     VITAL SIGNS:  Blood pressure 146/59, pulse 98, temperature 98.4 F (36.9 C), temperature source Oral, resp. rate 24, height 5\' 2"  (1.575 m), weight 87.544 kg (193 lb), SpO2 86 %.  PHYSICAL EXAMINATION:  Physical Exam  GENERAL:  75 y.o.-year-old patient lying in the bed with  distress due to pain and left lower extremity. Morbidly obese EYES: Pupils equal, round, reactive to light and accommodation. No scleral icterus. Extraocular muscles intact.  HEENT: Head atraumatic, normocephalic. Oropharynx and nasopharynx clear. No oropharyngeal erythema, moist oral mucosa  NECK:  Supple, no jugular venous distention. No thyroid enlargement, no tenderness.  LUNGS: Increased work of breathing. Bilateral basal crackles. No wheezing. CARDIOVASCULAR: S1, S2 normal. No murmurs, rubs, or gallops.  ABDOMEN: Soft, nontender, nondistended. Bowel sounds present. No organomegaly or mass.  EXTREMITIES: No  cyanosis, or clubbing. 3+ bilateral lower extremity edema  NEUROLOGIC: Cranial nerves II through XII are intact. No focal Motor or sensory deficits appreciated b/l PSYCHIATRIC: The patient is alert and oriented x 3. Pleasant. Tearful at times. SKIN: Sensitive erythema with blisters, warm and tenderness in the left leg extending from knee to foot. Right leg shin with mild redness and warmth. Not tender. No discharge noticed.  LABORATORY PANEL:   CBC  Recent Labs Lab 12/12/15 0924  WBC 8.4  HGB 12.3  HCT 36.5  PLT 244    ------------------------------------------------------------------------------------------------------------------  Chemistries   Recent Labs Lab 12/12/15 0924  NA 142  K 4.1  CL 105  CO2 32  GLUCOSE 128*  BUN 10  CREATININE 0.73  CALCIUM 8.6*  AST 17  ALT 12*  ALKPHOS 59  BILITOT 0.5   ------------------------------------------------------------------------------------------------------------------  Cardiac Enzymes  Recent Labs Lab 12/12/15 0924  TROPONINI <0.03   ------------------------------------------------------------------------------------------------------------------  RADIOLOGY:  Dg Chest Portable 1 View  12/12/2015  CLINICAL DATA:  Bilateral leg edema EXAM: PORTABLE CHEST 1 VIEW COMPARISON:  11/07/2015 and 11/02/2015 CT scan FINDINGS: Cardiomediastinal silhouette is stable. Surgical clips in right axilla again noted. No acute infiltrate or pulmonary edema. Stable hyperinflation and probable chronic mild interstitial prominence. Persistent pleural parenchymal focal density in right apex laterally. Measures about 1.5 cm. Malignancy cannot be excluded. Further evaluation is again recommended. IMPRESSION: No acute infiltrate or pulmonary edema. Stable hyperinflation and probable chronic mild interstitial prominence. Persistent pleural parenchymal focal density in right apex laterally. Measures about 1.5 cm. Malignancy cannot be excluded. Further evaluation is again recommended. Electronically Signed   By: Lahoma Crocker M.D.   On: 12/12/2015 09:50  IMPRESSION AND PLAN:   * Left lower extremity cellulitis involving extensive area below the knee. Onset likely due to chronic edema with diabetes and being on steroids. Start broad-spectrum antibiotics with IV vancomycin and Zosyn. Pain medications. Has mild erythema on the right leg but this is not tender but mildly warm. Likely early onset cellulitis and she is on antibiotics. Will likely need [place] boots at  discharge.  * Acute on chronic diastolic CHF Chest x-ray has pulmonary edema. Mild orthopnea. Worsening lower extremity edema. - IV Lasix - Input and Output - Counseled to limit fluids and Salt - Monitor Bun/Cr and Potassium -Cardiology follow up after discharge  * Right upper lobe pulmonary mass Patient had CT scan of the chest in March 2017 for the same. This showed a 2.1 cm speculated mass concerning for malignancy. Patient aware of this but does not want any further workup.  * COPD with chronic respiratory failure Stable and will continue home inhalers and nebulizers.  * DVT prophylaxis with Lovenox  All the records are reviewed and case discussed with ED provider. Management plans discussed with the patient, family and they are in agreement.  CODE STATUS: Limited. Okay to resuscitate but DO NOT INTUBATE.  TOTAL TIME TAKING CARE OF THIS PATIENT: 45 minutes.   Hillary Bow R M.D on 12/12/2015 at 11:20 AM  Between 7am to 6pm - Pager - [phone]  After 6pm go to [email or web address] - password [email or web address] [facility]  Office  [phone]  CC: Primary care physician; Gwendolyn Grant, MD  Note: This dictation was prepared with Dragon dictation along with smaller phrase technology. Any transcriptional errors that result from this process are unintentional.

## 2015-12-12 NOTE — Progress Notes (Signed)
Pharmacy Antibiotic Note  VEVA MALEK is a 75 y.o. female admitted on 12/12/2015 with cellulitis.  Pharmacy has been consulted for vancomycin and zosyn dosing.  Plan: Ordered vancomycin 1gm x 1 and zosyn 3.375gm x 1 in ED. Will follow with zosyn 3.375gm IV Q8H extended infusion and vancomycin 750mg  IV Q12H to start 8 hours after initial dose. Will check trough prior to 5th dose of vancomycin, which should be at steady state. Target trough: 10-63mcg/ml  Height: 5\' 2"  (157.5 cm) Weight: 193 lb (87.544 kg) IBW/kg (Calculated) : 50.1  Temp (24hrs), Avg:98.4 F (36.9 C), Min:98.4 F (36.9 C), Max:98.4 F (36.9 C)   Recent Labs Lab 12/12/15 0924  WBC 8.4  CREATININE 0.73    Estimated Creatinine Clearance: 62.4 mL/min (by C-G formula based on Cr of 0.73).    Allergies  Allergen Reactions  . Codeine     REACTION: makes pt pass out  . Levaquin [Levofloxacin In D5w] Other (See Comments)    redness  . Sulfonamide Derivatives     REACTION: edema    Antimicrobials this admission: 4/11 vancomycin >>  4/11 zosyn >>   Microbiology results:   Thank you for allowing pharmacy to be a part of this patient's care.  Gurfateh Mcclain C 12/12/2015 12:33 PM

## 2015-12-12 NOTE — ED Provider Notes (Signed)
[facility] Emergency Department Provider Note  ____________________________________________  Time seen: Approximately 10:49 AM  I have reviewed the triage vital signs and the nursing notes.   HISTORY  Chief Complaint Leg Swelling    HPI Kaitlin Henderson is a 75 y.o. female who complains of swelling in the legs this been going on for months. Patient reports this gotten worse and here lately her legs become very painful and red and swollen. This is happening in the last few days. Patient reports it hurts to walk or move. Patient denies any fever or vomiting. Patient is chronically short of breath. She says she's had pneumonia 15 times in her life starting at age 77. Pain in her legs is severe and interferes with her difficulty with her ability to go to the bathroom.   Past Medical History  Diagnosis Date  . Breast cancer, right breast ([place]) 1998    s/p chemo & XRT, right mastectomy  . OBESITY   . CORONARY ARTERY DISEASE   . APHTHOUS STOMATITIS   . DEPRESSION     started sertraline 09/2010  . DIABETES, TYPE 2 dx 03/2010  . HYPERLIPIDEMIA   . Hypothyroidism   . C O P D     chronic O2 3LPM [place]  . Essential tremor   . NSTEMI (non-ST elevated myocardial infarction) ([place]) 01/2012    med mgmt  . Anginal pain ([place])   . GERD (gastroesophageal reflux disease)     Patient Active Problem List   Diagnosis Date Noted  . Cellulitis of right lower extremity 11/26/2015  . Lung nodule 11/25/2015  . Anxiety 11/25/2015  . GERD (gastroesophageal reflux disease) 11/25/2015  . Cellulitis of left lower extremity 11/25/2015  . Leukocytosis 11/16/2015  . Cellulitis 11/16/2015  . 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 COPD exacerbation ([place])   . Acute on chronic respiratory failure with hypoxia ([place])   . SOB (shortness of breath) 11/02/2015  . Non-compliant behavior 12/05/2014  . Decreased mobility 08/15/2014  . Benign essential tremor 09/05/2011  . Breast  cancer, right breast ([place])   . DEPRESSION 09/25/2010  . HAIR LOSS 06/26/2010  . Type 2 diabetes mellitus with neurological complications ([place]) [id number]  . POLYURIA 03/13/2010  . APHTHOUS STOMATITIS 08/23/2009  . CHRONIC RESPIRATORY FAILURE 08/18/2007  . Hyperlipidemia 08/17/2007  . OBESITY 08/17/2007  . CORONARY ARTERY DISEASE 08/17/2007  . COPD with emphysema, [place]  08/17/2007    Past Surgical History  Procedure Laterality Date  . Appendectomy    . Cholecystectomy    . Abdominal hysterectomy    . Right mastectomy    . Tonsillectomy    . Mastectomy      Current Outpatient Rx  Name  Route  Sig  Dispense  Refill  . albuterol (PROVENTIL) (2.5 MG/3ML) 0.083% nebulizer solution   Nebulization   Take 3 mLs (2.5 mg total) by nebulization every 6 (six) hours as needed for wheezing or shortness of breath.   75 mL   0   . ALPRAZolam (XANAX) 0.25 MG tablet   Oral   Take 1 tablet (0.25 mg total) by mouth 3 (three) times daily as needed for anxiety.   30 tablet   0   . atorvastatin (LIPITOR) 40 MG tablet   Oral   Take 1 tablet (40 mg total) by mouth daily.   90 tablet   3   . budesonide (PULMICORT) 0.5 MG/2ML nebulizer solution   Nebulization   Take 2 mLs (0.5 mg  total) by nebulization 2 (two) times daily.   75 mL   0   . esomeprazole (NEXIUM) 40 MG capsule      TAKE 1 CAPSULE BY MOUTH ONCE A DAY   30 capsule   5   . insulin aspart (NOVOLOG) 100 UNIT/ML injection   Subcutaneous   Inject 7 Units into the skin 3 (three) times daily with meals.   10 mL   0   . insulin detemir (LEVEMIR) 100 UNIT/ML injection   Subcutaneous   Inject 0.15 mLs (15 Units total) into the skin at bedtime.   10 mL   0   . polyethylene glycol (MIRALAX / GLYCOLAX) packet   Oral   Take 17 g by mouth daily as needed for moderate constipation.   14 each   0   . tiotropium (SPIRIVA) 18 MCG inhalation capsule   Inhalation   Place 1 capsule (18 mcg total) into inhaler and inhale  daily.   30 capsule   0     Allergies Codeine; Levaquin; and Sulfonamide derivatives  Family History  Problem Relation Age of Onset  . Diabetes Neg Hx   . Cancer Neg Hx     Social History Social History  Substance Use Topics  . Smoking status: Former Smoker -- 2.50 packs/day for 54 years    Types: Cigarettes    Quit date: 09/02/2006  . Smokeless tobacco: Never Used     Comment: Retired. Widow/widower since 2008-lives alone. Home hospice since 06/2009 related to COPD  . Alcohol Use: No    Review of Systems Constitutional: No fever/chillsPatient does reports she's felt hot and cold and sweaty at the present time.  Eyes: No visual changes. ENT: No sore throat. Cardiovascular: Denies chest pain. Respiratory: Denies shortness of breath. Gastrointestinal: No abdominal pain.  No nausea, no vomiting.  No diarrhea.  No constipation. Genitourinary: Negative for dysuria. Musculoskeletal: Negative for back pain. Skin: Negative for rash.  10-point ROS otherwise negative.  ____________________________________________   PHYSICAL EXAM:  VITAL SIGNS: ED Triage Vitals  Enc Vitals Group     BP 12/12/15 0859 157/61 mmHg     Pulse Rate 12/12/15 0859 98     Resp 12/12/15 0859 18     Temp 12/12/15 0859 98.4 F (36.9 C)     Temp Source 12/12/15 0859 Oral     SpO2 12/12/15 0859 86 %     Weight 12/12/15 0859 193 lb (87.544 kg)     Height 12/12/15 0859 5\' 2"  (1.575 m)     Head Cir --      Peak Flow --      Pain Score 12/12/15 0858 0     Pain Loc --      Pain Edu? --      Excl. in [name]? --     Constitutional: Alert and oriented. Well appearing and in no acute distress. Eyes: Conjunctivae are normal. PERRL. EOMI. Head: Atraumatic. Nose: No congestion/rhinnorhea. Mouth/Throat: Mucous membranes are moist.  Oropharynx non-erythematous. Neck: No stridor.  Cardiovascular: Normal rate, regular rhythm. Grossly normal heart sounds.  Good peripheral circulation. Respiratory: Normal  respiratory effort.  No retractions. Lungs CTAB. Gastrointestinal: Soft and nontender. No distention. No abdominal bruits. No CVA tenderness. Musculoskeletal: Both legs are edematous. About 2+. Left leg is fiery red swollen some lesions on the shin which look like it might repeat that times. There is a patch on the left lateral leg lower leg as well which is red swollen and tender although not  as bad as the right leg. The right leg is red from the top of the foot to the upper thigh.  No joint effusions. Neurologic:  Normal speech and language. No gross focal neurologic deficits are appreciated. No gait instability. Skin:  Skin is warm, dry and intact. Skin as noted in musculoskeletal. Psychiatric: Mood and affect are normal. Speech and behavior are normal.  ____________________________________________   LABS (all labs ordered are listed, but only abnormal results are displayed)  Labs Reviewed  COMPREHENSIVE METABOLIC PANEL - Abnormal; Notable for the following:    Glucose, Bld 128 (*)    Calcium 8.6 (*)    Total Protein 6.4 (*)    Albumin 3.1 (*)    ALT 12 (*)    All other components within normal limits  BRAIN NATRIURETIC PEPTIDE - Abnormal; Notable for the following:    B Natriuretic Peptide 175.0 (*)    All other components within normal limits  TROPONIN I  CBC WITH DIFFERENTIAL/PLATELET   ____________________________________________  EKG  EKG read and interpreted by me shows normal sinus rhythm rate of 88 axis no acute ST-T wave changes baseline is not very good. ____________________________________________  RADIOLOGY  Chest x-ray shows again a spot in the right upper lobe per radiology. Patient has had a CT. Hospitalist will follow-up on this further. ____________________________________________   PROCEDURES    ____________________________________________   INITIAL IMPRESSION / ASSESSMENT AND PLAN / ED COURSE  Pertinent labs & imaging results that were available  during my care of the patient were reviewed by me and considered in my medical decision making (see chart for details).   ____________________________________________   FINAL CLINICAL IMPRESSION(S) / ED DIAGNOSES  Final diagnoses:  Cellulitis of lower extremity, unspecified laterality      Nena Polio, MD 12/12/15 1100

## 2015-12-12 NOTE — ED Notes (Addendum)
Pt refused tylenol because she was told "it causes cancer"

## 2015-12-13 ENCOUNTER — Inpatient Hospital Stay: Payer: Medicare Other

## 2015-12-13 LAB — GLUCOSE, CAPILLARY
GLUCOSE-CAPILLARY: 161 mg/dL — AB (ref 65–99)
GLUCOSE-CAPILLARY: 119 mg/dL — AB (ref 65–99)
Glucose-Capillary: 128 mg/dL — ABNORMAL HIGH (ref 65–99)
Glucose-Capillary: 153 mg/dL — ABNORMAL HIGH (ref 65–99)

## 2015-12-13 LAB — BASIC METABOLIC PANEL
ANION GAP: 6 (ref 5–15)
BUN: 11 mg/dL (ref 6–20)
CO2: 36 mmol/L — ABNORMAL HIGH (ref 22–32)
Calcium: 8.2 mg/dL — ABNORMAL LOW (ref 8.9–10.3)
Chloride: 98 mmol/L — ABNORMAL LOW (ref 101–111)
Creatinine, Ser: 0.9 mg/dL (ref 0.44–1.00)
GLUCOSE: 135 mg/dL — AB (ref 65–99)
POTASSIUM: 3.1 mmol/L — AB (ref 3.5–5.1)
Sodium: 140 mmol/L (ref 135–145)

## 2015-12-13 LAB — CBC
HEMATOCRIT: 35.5 % (ref 35.0–47.0)
HEMOGLOBIN: 12.2 g/dL (ref 12.0–16.0)
MCH: 32.6 pg (ref 26.0–34.0)
MCHC: 34.2 g/dL (ref 32.0–36.0)
MCV: 95.3 fL (ref 80.0–100.0)
Platelets: 231 10*3/uL (ref 150–440)
RBC: 3.73 MIL/uL — AB (ref 3.80–5.20)
RDW: 14.3 % (ref 11.5–14.5)
WBC: 8 10*3/uL (ref 3.6–11.0)

## 2015-12-13 MED ORDER — CLOTRIMAZOLE 1 % EX CREA
1.0000 "application " | TOPICAL_CREAM | Freq: Two times a day (BID) | CUTANEOUS | Status: DC
  Administered 2015-12-13 – 2015-12-14 (×2): 1 via TOPICAL
  Filled 2015-12-13: qty 15

## 2015-12-13 MED ORDER — FUROSEMIDE 40 MG PO TABS
40.0000 mg | ORAL_TABLET | Freq: Every day | ORAL | Status: DC
  Administered 2015-12-13 – 2015-12-14 (×2): 40 mg via ORAL
  Filled 2015-12-13 (×2): qty 1

## 2015-12-13 MED ORDER — FLUCONAZOLE 100 MG PO TABS
100.0000 mg | ORAL_TABLET | Freq: Every day | ORAL | Status: DC
  Administered 2015-12-13 – 2015-12-14 (×2): 100 mg via ORAL
  Filled 2015-12-13 (×2): qty 1

## 2015-12-13 MED ORDER — POTASSIUM CHLORIDE CRYS ER 20 MEQ PO TBCR: 40.0000 meq | EXTENDED_RELEASE_TABLET | ORAL | Status: DC | PRN

## 2015-12-13 NOTE — Clinical Social Work Note (Signed)
Clinical Social Work Assessment  Patient Details  Name: Kaitlin Henderson MRN: [id number] Date of Birth: 07-20-1941  Date of referral:  12/13/15               Reason for consult:  Other (Comment Required) (Patient is from [place]. )                Permission sought to share information with:    Permission granted to share information::     Name::        Agency::     Relationship::     Contact Information:     Housing/Transportation Living arrangements for the past 2 months:  [place] of Information:  Patient Patient Interpreter Needed:  None Criminal Activity/Legal Involvement Pertinent to Current Situation/Hospitalization:  No - Comment as needed Significant Relationships:  Significant Other Lives with:  Self Do you feel safe going back to the place where you live?  Yes Need for family participation in patient care:  No (Coment)  Care giving concerns:  Patient is from [place] [facility].    Social Worker assessment / plan:  Holiday representative (CSW) is familiar with patient from last admission. Patient went to [facility] in [place] stayed 20 days and her boyfriend Randall Hiss drove her from [facility] back to [place]. Per patient she was in her co-pay days and cannot pay them so she had to leave SNF. CSW explained that she would need to have a 60 day wellness period out of the hospital and out of SNF in order for her Medicare days to start over. Per patient she plans on returning to [facility] from [facility] and reported that Randall Hiss would transport her. RN Case Manager is aware of above. CSW will continue to follow and assist as needed.   Employment status:  Disabled (Comment on whether or not currently receiving Disability), Retired Forensic scientist:  Medicare PT Recommendations:  Not assessed at this time Information / Referral to community resources:  Other (Comment Required) (Patient plans on returning to [place])  Patient/Family's Response to care:  Patient plans on returning to [facility].    Patient/Family's Understanding of and Emotional Response to Diagnosis, Current Treatment, and Prognosis:  Patient was pleasant and thanked CSW for visit.   Emotional Assessment Appearance:  Appears older than stated age Attitude/Demeanor/Rapport:    Affect (typically observed):  Accepting, Adaptable, Pleasant Orientation:  Oriented to Self, Oriented to Place, Oriented to  Time, Oriented to Situation Alcohol / Substance use:  Not Applicable Psych involvement (Current and /or in the community):  No (Comment)  Discharge Needs  Concerns to be addressed:  Discharge Planning Concerns Readmission within the last 30 days:  No Current discharge risk:  Chronically ill, Dependent with Mobility Barriers to Discharge:  Continued Medical Work up   Loralyn Freshwater, LCSW 12/13/2015, 4:04 PM

## 2015-12-13 NOTE — Care Management (Addendum)
Patient was a recent discharge to [facility] and returns from [facility] independent living where she is on chronic O2. She wanted [facility] hospice last visit and to be a DNR however that changed while she was here. She is a partial code and no palliative consult. She is admitted for cellulitis. I have requested an over night Oximetry from Dr. Benjie Karvonen to see if patient would qualify for home BiPAP as she has respiratory disease.

## 2015-12-13 NOTE — Progress Notes (Signed)
[place] [facility] [place] NAME: Kaitlin Henderson    MR#:  [id number]  DATE OF BIRTH:  27-Jan-1941  SUBJECTIVE:   Patient here with left lower external he cellulitis  REVIEW OF SYSTEMS:    Review of Systems  Constitutional: Negative for fever, chills and malaise/fatigue.  HENT: Negative for ear discharge, ear pain, hearing loss, nosebleeds and sore throat.   Eyes: Negative for blurred vision and pain.  Respiratory: Negative for cough, hemoptysis, shortness of breath and wheezing.   Cardiovascular: Positive for leg swelling. Negative for chest pain and palpitations.  Gastrointestinal: Negative for nausea, vomiting, abdominal pain, diarrhea and blood in stool.  Genitourinary: Negative for dysuria.  Musculoskeletal: Negative for back pain.  Skin:       Cellulitis LLE   Neurological: Negative for dizziness, tremors, speech change, focal weakness, seizures and headaches.  Endo/Heme/Allergies: Does not bruise/bleed easily.  Psychiatric/Behavioral: Negative for depression, suicidal ideas and hallucinations.    Tolerating Diet:yes      DRUG ALLERGIES:   Allergies  Allergen Reactions  . Codeine     REACTION: makes pt pass out  . Levaquin [Levofloxacin In D5w] Other (See Comments)    redness  . Sulfonamide Derivatives     REACTION: edema    VITALS:  Blood pressure 111/53, pulse 81, temperature 97.9 F (36.6 C), temperature source Oral, resp. rate 20, height 5\' 2"  (1.575 m), weight 92.488 kg (203 lb 14.4 oz), SpO2 95 %.  PHYSICAL EXAMINATION:   Physical Exam  Constitutional: She is oriented to person, place, and time and well-developed, well-nourished, and in no distress. No distress.  HENT:  Head: Normocephalic.  Eyes: No scleral icterus.  Neck: Normal range of motion. Neck supple. No JVD present. No tracheal deviation present.  Cardiovascular: Normal rate, regular rhythm and normal heart sounds.  Exam reveals no gallop and  no friction rub.   No murmur heard. Pulmonary/Chest: Effort normal and breath sounds normal. No respiratory distress. She has no wheezes. She has no rales. She exhibits no tenderness.  Abdominal: Soft. Bowel sounds are normal. She exhibits no distension and no mass. There is no tenderness. There is no rebound and no guarding.  Musculoskeletal: Normal range of motion. She exhibits edema.  Neurological: She is alert and oriented to person, place, and time.  Skin: Skin is warm. No rash noted. No erythema.  Left lower extremity with erythema up to knee with 3+ edema  Psychiatric: Affect and judgment normal.      LABORATORY PANEL:   CBC  Recent Labs Lab 12/13/15 0527  WBC 8.0  HGB 12.2  HCT 35.5  PLT 231   ------------------------------------------------------------------------------------------------------------------  Chemistries   Recent Labs Lab 12/12/15 0924 12/13/15 0527  NA 142 140  K 4.1 3.1*  CL 105 98*  CO2 32 36*  GLUCOSE 128* 135*  BUN 10 11  CREATININE 0.73 0.90  CALCIUM 8.6* 8.2*  AST 17  --   ALT 12*  --   ALKPHOS 59  --   BILITOT 0.5  --    ------------------------------------------------------------------------------------------------------------------  Cardiac Enzymes  Recent Labs Lab 12/12/15 0924  TROPONINI <0.03   ------------------------------------------------------------------------------------------------------------------  RADIOLOGY:  US Venous Img Lower Bilateral  12/13/2015  CLINICAL DATA:  Left greater than right pain, edema, redness EXAM: BILATERAL LOWER EXTREMITY VENOUS DOPPLER ULTRASOUND TECHNIQUE: Gray-scale sonography with graded compression, as well as color Doppler and duplex ultrasound were performed to evaluate the lower extremity deep venous systems from the level of  the common femoral vein and including the common femoral, femoral, profunda femoral, popliteal and calf veins including the posterior tibial, peroneal and  gastrocnemius veins when visible. The superficial great saphenous vein was also interrogated. Spectral Doppler was utilized to evaluate flow at rest and with distal augmentation maneuvers in the common femoral, femoral and popliteal veins. COMPARISON:  None. FINDINGS: RIGHT LOWER EXTREMITY Common Femoral Vein: No evidence of thrombus. Normal compressibility, respiratory phasicity and response to augmentation. Saphenofemoral Junction: No evidence of thrombus. Normal compressibility and flow on color Doppler imaging. Profunda Femoral Vein: No evidence of thrombus. Normal compressibility and flow on color Doppler imaging. Femoral Vein: No evidence of thrombus. Normal compressibility, respiratory phasicity and response to augmentation. Popliteal Vein: No evidence of thrombus. Normal compressibility, respiratory phasicity and response to augmentation. Calf Veins: No evidence of thrombus. Normal compressibility and flow on color Doppler imaging. Superficial Great Saphenous Vein: No evidence of thrombus. Normal compressibility and flow on color Doppler imaging. Venous Reflux:  None. Other Findings:  None. LEFT LOWER EXTREMITY Common Femoral Vein: No evidence of thrombus. Normal compressibility, respiratory phasicity and response to augmentation. Saphenofemoral Junction: No evidence of thrombus. Normal compressibility and flow on color Doppler imaging. Profunda Femoral Vein: No evidence of thrombus. Normal compressibility and flow on color Doppler imaging. Femoral Vein: No evidence of thrombus. Normal compressibility, respiratory phasicity and response to augmentation. Popliteal Vein: No evidence of thrombus. Normal compressibility, respiratory phasicity and response to augmentation. Calf Veins: No evidence of thrombus. Normal compressibility and flow on color Doppler imaging. Superficial Great Saphenous Vein: No evidence of thrombus. Normal compressibility and flow on color Doppler imaging. Venous Reflux:  None. Other  Findings:  None. IMPRESSION: No evidence of deep venous thrombosis. Electronically Signed   By: Jerilynn Mages.  Shick M.D.   On: 12/13/2015 10:57   Dg Chest Portable 1 View  12/12/2015  CLINICAL DATA:  Bilateral leg edema EXAM: PORTABLE CHEST 1 VIEW COMPARISON:  11/07/2015 and 11/02/2015 CT scan FINDINGS: Cardiomediastinal silhouette is stable. Surgical clips in right axilla again noted. No acute infiltrate or pulmonary edema. Stable hyperinflation and probable chronic mild interstitial prominence. Persistent pleural parenchymal focal density in right apex laterally. Measures about 1.5 cm. Malignancy cannot be excluded. Further evaluation is again recommended. IMPRESSION: No acute infiltrate or pulmonary edema. Stable hyperinflation and probable chronic mild interstitial prominence. Persistent pleural parenchymal focal density in right apex laterally. Measures about 1.5 cm. Malignancy cannot be excluded. Further evaluation is again recommended. Electronically Signed   By: Lahoma Crocker M.D.   On: 12/12/2015 09:50     ASSESSMENT AND PLAN:   75 year old female with history of essential hypertension, COPD and chronic respiratory failure on 3 L oxygen who presents with left lower extremity cellulitis   1. Left lower extremity cellulitis: Doppler was negative for DVT. Continue IV vancomycin and Zosyn. Elevate leg Vascular outpatient follow-up at discharge  2. COPD with Chronic respiratory failure: I do not believe the patient has diastolic dysfunction. Her echocardiogram 11/07/2015 was normal without mention of diastolic heart failure or systolic heart failure. Continue oxygen Continue low-dose Lasix for lower extremity edema Continue inhalers.  3. Hypokalemia: Potassium replacement and recheck in a.m.  4. Right upper lobe pulmonary mass: This was seen in early March. Patient is aware this and at this time no further workup.   Management plans discussed with the patient and she is in agreement.  CODE  STATUS: partial  TOTAL TIME TAKING CARE OF THIS PATIENT: 30 minutes.     POSSIBLE  D/C 2 days, DEPENDING ON CLINICAL CONDITION.   Ayelen Sciortino M.D on 12/13/2015 at 11:19 AM  Between 7am to 6pm - Pager - [phone] After 6pm go to [email or web address] - password [email or web address] [facility]  Office  [phone]  CC: Primary care physician; Gwendolyn Grant, MD  Note: This dictation was prepared with Dragon dictation along with smaller phrase technology. Any transcriptional errors that result from this process are unintentional.

## 2015-12-14 ENCOUNTER — Telehealth: Payer: Self-pay | Admitting: Internal Medicine

## 2015-12-14 ENCOUNTER — Telehealth: Payer: Self-pay | Admitting: *Deleted

## 2015-12-14 LAB — BASIC METABOLIC PANEL
ANION GAP: 6 (ref 5–15)
BUN: 15 mg/dL (ref 6–20)
CALCIUM: 8.6 mg/dL — AB (ref 8.9–10.3)
CO2: 34 mmol/L — AB (ref 22–32)
Chloride: 100 mmol/L — ABNORMAL LOW (ref 101–111)
Creatinine, Ser: 0.92 mg/dL (ref 0.44–1.00)
GFR calc Af Amer: >60 mL/min (ref >60)
GFR calc non Af Amer: 59 mL/min — ABNORMAL LOW (ref >60)
GLUCOSE: 141 mg/dL — AB (ref 65–99)
POTASSIUM: 3.4 mmol/L — AB (ref 3.5–5.1)
Sodium: 140 mmol/L (ref 135–145)

## 2015-12-14 LAB — GLUCOSE, CAPILLARY
GLUCOSE-CAPILLARY: 244 mg/dL — AB (ref 65–99)
Glucose-Capillary: 129 mg/dL — ABNORMAL HIGH (ref 65–99)

## 2015-12-14 MED ORDER — ALPRAZOLAM 0.25 MG PO TABS: 0.1250 mg | ORAL_TABLET | Freq: Three times a day (TID) | ORAL | Status: DC | PRN

## 2015-12-14 MED ORDER — CLINDAMYCIN HCL 300 MG PO CAPS: 300.0000 mg | ORAL_CAPSULE | Freq: Three times a day (TID) | ORAL | Status: DC

## 2015-12-14 MED ORDER — ALIGN 4 MG PO CAPS: 4.0000 mg | ORAL_CAPSULE | Freq: Every day | ORAL | Status: DC

## 2015-12-14 MED ORDER — PIPERACILLIN-TAZOBACTAM 3.375 G IVPB
3.3750 g | Freq: Three times a day (TID) | INTRAVENOUS | Status: DC
  Filled 2015-12-14 (×2): qty 50

## 2015-12-14 MED ORDER — CEPHALEXIN 500 MG PO CAPS: 500.0000 mg | ORAL_CAPSULE | Freq: Four times a day (QID) | ORAL | Status: DC

## 2015-12-14 MED ORDER — CLOTRIMAZOLE 1 % EX CREA: 1.0000 "application " | TOPICAL_CREAM | Freq: Two times a day (BID) | CUTANEOUS | Status: DC

## 2015-12-14 MED ORDER — POTASSIUM CHLORIDE CRYS ER 20 MEQ PO TBCR
40.0000 meq | EXTENDED_RELEASE_TABLET | Freq: Once | ORAL | Status: AC
Start: 2015-12-14 — End: 2015-12-14
  Administered 2015-12-14: 40 meq via ORAL
  Filled 2015-12-14: qty 2

## 2015-12-14 NOTE — Progress Notes (Signed)
Pharmacy Antibiotic Note  Kaitlin Henderson is a 75 y.o. female admitted on 12/12/2015 with cellulitis.  Pharmacy has been consulted for vancomycin and zosyn dosing.  Plan: Continue Zosyn EI 3.375g IV Q8hr.  Continue Vancomycin 750mg  IV Q12hr, goal trough 10-15. Vanc trough 4/13 2230.   Height: 5\' 2"  (157.5 cm) Weight: 191 lb 6.4 oz (86.818 kg) IBW/kg (Calculated) : 50.1  Temp (24hrs), Avg:98.2 F (36.8 C), Min:97.9 F (36.6 C), Max:98.4 F (36.9 C)   Recent Labs Lab 12/12/15 0924 12/13/15 0527 12/14/15 0511  WBC 8.4 8.0  --   CREATININE 0.73 0.90 0.92    Estimated Creatinine Clearance: 54 mL/min (by C-G formula based on Cr of 0.92).    Allergies  Allergen Reactions  . Codeine     REACTION: makes pt pass out  . Levaquin [Levofloxacin In D5w] Other (See Comments)    redness  . Sulfonamide Derivatives     REACTION: edema    Antimicrobials this admission: 4/11 vancomycin >>  4/11 zosyn >>  4/12 fluconazole>>  Microbiology results: 4/11 MRSA PCR: negative   Thank you for allowing pharmacy to be a part of this patient's care.  Yazmeen Woolf L 12/14/2015 10:40 AM

## 2015-12-14 NOTE — Telephone Encounter (Signed)
Left vm with verbal okay.  ? ?

## 2015-12-14 NOTE — Telephone Encounter (Signed)
Ok for verbal 

## 2015-12-14 NOTE — Progress Notes (Signed)
Clinical Education officer, museum (CSW) met with patient at RN's request. Patient was tearful and reported that she did not want to go back to [facility]. CSW explained that patient has not had a 3 night inpatient qualifying stay in order for Medicare to pay for rehab. CSW also looked at previous admission at [facility] to see if it was within 30 days and it was not. Patient does not have Medicaid and cannot pay privately for SNF. CSW explained that the only option is for patient to return to [place] today with home health and apply for Medicaid for long term care. CSW also asked why patient left [facility] if she did not want to live at [facility]. Per patient she did not know why she left. CSW provided emotional support and community resources. Please reconsult if future social work needs arise. CSW signing off.   Blima Rich, LCSW [phone]

## 2015-12-14 NOTE — Discharge Summary (Signed)
[facility] [place] NAME: Kaitlin Henderson    MR#:  [id number]  DATE OF BIRTH:  02-May-1941  DATE OF ADMISSION:  12/12/2015 ADMITTING PHYSICIAN: Hillary Bow, MD  DATE OF DISCHARGE: 12/14/2015  PRIMARY CARE PHYSICIAN: Gwendolyn Grant, MD    ADMISSION DIAGNOSIS:  Cellulitis of lower extremity, unspecified laterality [id number]  DISCHARGE DIAGNOSIS:  Active Problems:   Cellulitis of leg, left   SECONDARY DIAGNOSIS:   Past Medical History  Diagnosis Date  . Breast cancer, right breast ([place]) 1998    s/p chemo & XRT, right mastectomy  . OBESITY   . CORONARY ARTERY DISEASE   . APHTHOUS STOMATITIS   . DEPRESSION     started sertraline 09/2010  . DIABETES, TYPE 2 dx 03/2010  . HYPERLIPIDEMIA   . Hypothyroidism   . C O P D     chronic O2 3LPM [place]  . Essential tremor   . NSTEMI (non-ST elevated myocardial infarction) ([place]) 01/2012    med mgmt  . Anginal pain ([place])   . GERD (gastroesophageal reflux disease)     HOSPITAL COURSE:   75 year old female with history of essential hypertension, COPD and chronic respiratory failure on 3 L oxygen who presents with left lower extremity cellulitis   1. Left lower extremity cellulitis: Lower extremity doppler was negative for DVT. She was treated with IV vancomycin and Zosyn and instructed to elevate leg She may benefit from vascular outpatient follow-up at discharge which can be redirected to her outpatient PCP  2. COPD with Chronic respiratory failure: She does not have diastolic dysfunction. Her echocardiogram 11/07/2015 was normal without mention of diastolic heart failure or systolic heart failure. She will need to continue home oxygen and follow-up with pulmonologist as outpatient. .  3. Hypokalemia: Potassium was repleted.  4. Right upper lobe pulmonary mass: This was seen in early March. Patient is aware this and at this time no further workup.    DISCHARGE CONDITIONS AND  DIET:   Stable for discharge on a regular diet  CONSULTS OBTAINED:     DRUG ALLERGIES:   Allergies  Allergen Reactions  . Codeine     REACTION: makes pt pass out  . Levaquin [Levofloxacin In D5w] Other (See Comments)    redness  . Sulfonamide Derivatives     REACTION: edema    DISCHARGE MEDICATIONS:   Current Discharge Medication List    START taking these medications   Details  cephALEXin (KEFLEX) 500 MG capsule Take 1 capsule (500 mg total) by mouth 4 (four) times daily. Qty: 30 capsule, Refills: 0    clindamycin (CLEOCIN) 300 MG capsule Take 1 capsule (300 mg total) by mouth 3 (three) times daily. Qty: 30 capsule, Refills: 0    clotrimazole (LOTRIMIN) 1 % cream Apply 1 application topically 2 (two) times daily. Qty: 30 g, Refills: 0    Probiotic Product (ALIGN) 4 MG CAPS Take 1 capsule (4 mg total) by mouth daily. Qty: 30 capsule, Refills: 0      CONTINUE these medications which have CHANGED   Details  ALPRAZolam (XANAX) 0.25 MG tablet Take 0.5 tablets (0.125 mg total) by mouth 3 (three) times daily as needed for anxiety. Qty: 30 tablet, Refills: 0      CONTINUE these medications which have NOT CHANGED   Details  albuterol (PROVENTIL) (2.5 MG/3ML) 0.083% nebulizer solution Take 3 mLs (2.5 mg total) by nebulization every 6 (six) hours as needed for wheezing or shortness of breath.  Qty: 75 mL, Refills: 0    aspirin 325 MG tablet Take 650 mg by mouth at bedtime.    tiotropium (SPIRIVA) 18 MCG inhalation capsule Place 1 capsule (18 mcg total) into inhaler and inhale daily. Qty: 30 capsule, Refills: 0              Today   CHIEF COMPLAINT:  Lower extremity cellulitis is much improved. No acute issues.  VITAL SIGNS:  Blood pressure 118/62, pulse 89, temperature 98.2 F (36.8 C), temperature source Oral, resp. rate 18, height 5\' 2"  (1.575 m), weight 86.818 kg (191 lb 6.4 oz), SpO2 93 %.   REVIEW OF SYSTEMS:  Review of Systems  Constitutional:  Negative for fever, chills and malaise/fatigue.  HENT: Negative for ear discharge, ear pain, hearing loss, nosebleeds and sore throat.   Eyes: Negative for blurred vision and pain.  Respiratory: Negative for cough, hemoptysis, shortness of breath and wheezing.   Cardiovascular: Positive for leg swelling. Negative for chest pain and palpitations.  Gastrointestinal: Negative for nausea, vomiting, abdominal pain, diarrhea and blood in stool.  Genitourinary: Negative for dysuria.  Musculoskeletal: Negative for back pain.  Skin:       Left lower extremity cellulitis improved  Neurological: Negative for dizziness, tremors, speech change, focal weakness, seizures and headaches.  Endo/Heme/Allergies: Does not bruise/bleed easily.  Psychiatric/Behavioral: Negative for depression, suicidal ideas and hallucinations.     PHYSICAL EXAMINATION:  GENERAL:  75 y.o.-year-old patient lying in the bed with no acute distress.  NECK:  Supple, no jugular venous distention. No thyroid enlargement, no tenderness.  LUNGS: Normal breath sounds bilaterally, no wheezing, rales,rhonchi  No use of accessory muscles of respiration.  CARDIOVASCULAR: S1, S2 normal. No murmurs, rubs, or gallops.  ABDOMEN: Soft, non-tender, non-distended. Bowel sounds present. No organomegaly or mass.  EXTREMITIES: No pedal edema, cyanosis, or clubbing.  PSYCHIATRIC: The patient is alert and oriented x 3.  SKIN: Left lower extremity erythema has improved. No tenderness to palpation.  DATA REVIEW:   CBC  Recent Labs Lab 12/13/15 0527  WBC 8.0  HGB 12.2  HCT 35.5  PLT 231    Chemistries   Recent Labs Lab 12/12/15 0924  12/14/15 0511  NA 142  < > 140  K 4.1  < > 3.4*  CL 105  < > 100*  CO2 32  < > 34*  GLUCOSE 128*  < > 141*  BUN 10  < > 15  CREATININE 0.73  < > 0.92  CALCIUM 8.6*  < > 8.6*  AST 17  --   --   ALT 12*  --   --   ALKPHOS 59  --   --   BILITOT 0.5  --   --   < > = values in this interval not  displayed.  Cardiac Enzymes  Recent Labs Lab 12/12/15 0924  TROPONINI <0.03    Microbiology Results  @[email or web address] @  RADIOLOGY:  US Venous Img Lower Bilateral  12/13/2015  CLINICAL DATA:  Left greater than right pain, edema, redness EXAM: BILATERAL LOWER EXTREMITY VENOUS DOPPLER ULTRASOUND TECHNIQUE: Gray-scale sonography with graded compression, as well as color Doppler and duplex ultrasound were performed to evaluate the lower extremity deep venous systems from the level of the common femoral vein and including the common femoral, femoral, profunda femoral, popliteal and calf veins including the posterior tibial, peroneal and gastrocnemius veins when visible. The superficial great saphenous vein was also interrogated. Spectral Doppler was utilized to evaluate flow at rest and with  distal augmentation maneuvers in the common femoral, femoral and popliteal veins. COMPARISON:  None. FINDINGS: RIGHT LOWER EXTREMITY Common Femoral Vein: No evidence of thrombus. Normal compressibility, respiratory phasicity and response to augmentation. Saphenofemoral Junction: No evidence of thrombus. Normal compressibility and flow on color Doppler imaging. Profunda Femoral Vein: No evidence of thrombus. Normal compressibility and flow on color Doppler imaging. Femoral Vein: No evidence of thrombus. Normal compressibility, respiratory phasicity and response to augmentation. Popliteal Vein: No evidence of thrombus. Normal compressibility, respiratory phasicity and response to augmentation. Calf Veins: No evidence of thrombus. Normal compressibility and flow on color Doppler imaging. Superficial Great Saphenous Vein: No evidence of thrombus. Normal compressibility and flow on color Doppler imaging. Venous Reflux:  None. Other Findings:  None. LEFT LOWER EXTREMITY Common Femoral Vein: No evidence of thrombus. Normal compressibility, respiratory phasicity and response to augmentation. Saphenofemoral Junction: No evidence of  thrombus. Normal compressibility and flow on color Doppler imaging. Profunda Femoral Vein: No evidence of thrombus. Normal compressibility and flow on color Doppler imaging. Femoral Vein: No evidence of thrombus. Normal compressibility, respiratory phasicity and response to augmentation. Popliteal Vein: No evidence of thrombus. Normal compressibility, respiratory phasicity and response to augmentation. Calf Veins: No evidence of thrombus. Normal compressibility and flow on color Doppler imaging. Superficial Great Saphenous Vein: No evidence of thrombus. Normal compressibility and flow on color Doppler imaging. Venous Reflux:  None. Other Findings:  None. IMPRESSION: No evidence of deep venous thrombosis. Electronically Signed   By: Jerilynn Mages.  Shick M.D.   On: 12/13/2015 10:57      Management plans discussed with the patient and she is in agreement. Stable for discharge home with [facility]  Patient should follow up with PCP in 1 week  CODE STATUS:     Code Status Orders        Start     Ordered   12/12/15 1118  Limited resuscitation (code)   Continuous    Question Answer Comment  In the event of cardiac or respiratory ARREST: Initiate Code Blue, Call Rapid Response Yes   In the event of cardiac or respiratory ARREST: Perform CPR Yes   In the event of cardiac or respiratory ARREST: Perform Intubation/Mechanical Ventilation No   In the event of cardiac or respiratory ARREST: Use NIPPV/BiPAp only if indicated Yes   In the event of cardiac or respiratory ARREST: Administer ACLS medications if indicated Yes   In the event of cardiac or respiratory ARREST: Perform Defibrillation or Cardioversion if indicated Yes      12/12/15 1119    Code Status History    Date Active Date Inactive Code Status Order ID Comments User Context   11/08/2015 10:32 PM 11/09/2015  9:53 PM Full Code [id number]  Lance Coon, MD Inpatient   11/04/2015 10:59 AM 11/08/2015 10:32 PM DNR [id number]  Laverle Hobby, MD Inpatient    11/02/2015  8:23 PM 11/04/2015 10:59 AM Full Code [id number]  Dustin Flock, MD Inpatient   01/17/2012  8:32 PM 01/28/2012  4:49 PM DNR [id number]  Mila Palmer, RN Inpatient    Advance Directive Documentation        Most Recent Value   Type of Advance Directive  Healthcare Power of Attorney   Pre-existing out of facility DNR order (yellow form or pink MOST form)     "MOST" Form in Place?        TOTAL TIME TAKING CARE OF THIS PATIENT: 36 minutes.    Note: This dictation was prepared with Dragon dictation along with  smaller phrase technology. Any transcriptional errors that result from this process are unintentional.  Luvenia Cranford M.D on 12/14/2015 at 10:41 AM  Between 7am to 6pm - Pager - [phone] After 6pm go to [email or web address] - password [email or web address] [facility]  Office  [phone]  CC: Primary care physician; Gwendolyn Grant, MD

## 2015-12-14 NOTE — Telephone Encounter (Signed)
[facility] home health request verbal order to resume home health for Kaitlin Henderson. They are trying to see her tomorrow because is being discharge from the hospital today. Please call them back

## 2015-12-14 NOTE — Telephone Encounter (Signed)
Called pt concerning TCM appt no answer LMOM RTC.../lmb 

## 2015-12-14 NOTE — Care Management Important Message (Signed)
Important Message  Patient Details  Name: Kaitlin Henderson MRN: [id number] Date of Birth: [birth year]   Medicare Important Message Given:  Yes    Juliann Pulse A Phelicia Dantes 12/14/2015, 11:40 AM

## 2015-12-14 NOTE — Care Management Note (Addendum)
Case Management Note  Patient Details  Name: Kaitlin Henderson MRN: [id number] Date of Birth: [birth year]  Subjective/Objective:           Kaitlin Henderson is being discharged home today with home health RN, PT, Aid, Social Work. Kaitlin Henderson resides at [facility] who is contracted with [facility]. Cheryl at [facility] has been notifed of discharge today. Kaitlin Henderson is on chronic home oxygen provided by [facility].          Action/Plan:   Expected Discharge Date:                  Expected Discharge Plan:     In-House Referral:     Discharge planning Services     Post Acute Care Choice:    Choice offered to:     DME Arranged:    DME Agency:     HH Arranged:    [place] Agency:     Status of Service:     Medicare Important Message Given:    Date Medicare IM Given:    Medicare IM give by:    Date Additional Medicare IM Given:    Additional Medicare Important Message give by:     If discussed at [place] of Stay Meetings, dates discussed:    Additional Comments:  Cristan Hout A, RN 12/14/2015, 11:37 AM

## 2015-12-19 NOTE — Telephone Encounter (Signed)
I was out of the office Monday 12/18/15. Called pt concerning TCM appt no answer LMOM RTC...Johny Chess

## 2015-12-20 NOTE — Telephone Encounter (Signed)
Transition Care Management Follow-up Telephone Call   Date discharged? 12/14/15   How have you been since you were released from the hospital? Pt states she is ok   Do you understand why you were in the hospital? YES   Do you understand the discharge instructions? YES   Where were you discharged to? Home   Items Reviewed:  Medications reviewed: YES  Allergies reviewed: YES  Dietary changes reviewed: NO  Referrals reviewed: No referral needed   Functional Questionnaire:   Activities of Daily Living (ADLs):   She states she are independent in the following: feeding, continence, grooming and toileting States she require assistance with the following: ambulation, bathing and hygiene and dressing she states she is not able to get around like she use too   Any transportation issues/concerns?: NO   Any patient concerns? NO   Confirmed importance and date/time of follow-up visits scheduled YES appt was already made verified if she was aware which she stated yes. They made appt when she was d/c from [facility]. appt 12/25/15  Provider Appointment booked with Terri Piedra   Confirmed with patient if condition begins to worsen call PCP or go to the ER.  Patient was given the office number and encouraged to call back with question or concerns.  : YES

## 2015-12-22 ENCOUNTER — Telehealth: Payer: Self-pay

## 2015-12-22 ENCOUNTER — Telehealth: Payer: Self-pay | Admitting: *Deleted

## 2015-12-22 DIAGNOSIS — R197 Diarrhea, unspecified: Secondary | ICD-10-CM

## 2015-12-22 MED ORDER — ALPRAZOLAM 0.25 MG PO TABS: 0.1250 mg | ORAL_TABLET | Freq: Three times a day (TID) | ORAL | Status: DC | PRN

## 2015-12-22 NOTE — Telephone Encounter (Signed)
Left msg on triage stating pt was rx clindamycin but med is giving her severe diarrhea wanting to get alternative called into [place]. Also pt has order for Alprazolam 0.25 mg take 1 every 8 hours PRN for anxiety. Would like rx sent to pharmacy as well. MD is out of office pls advise on request.../lmb

## 2015-12-22 NOTE — Telephone Encounter (Signed)
Called pam no answer LMOM RTC..prescription for for alprazolam has been faxed to [facility]...Kaitlin Henderson

## 2015-12-22 NOTE — Telephone Encounter (Signed)
Stool studies ordered - we need to rule out cdiff.  Continue antibiotics if only three days left.  Should be seen next week or in the ED over the weekend if leg is not looking better.

## 2015-12-22 NOTE — Telephone Encounter (Signed)
Looks like she was given clindamycin and cephalexin upon discharge 4/13 for cellulitis -- how days does she have left?  How does the cellulitis look?  If she only has a day or two left we may need to stop the antibiotics depending on how the cellulitis looks.  If diarrhea does not stop she needs to come to the lab and do stool studies because she is high risk for cdiff diarrhea from all the antibiotics.    Alprazolam rx printed.  She needs an appt to establish with whoever she plans on establishing with

## 2015-12-22 NOTE — Telephone Encounter (Signed)
Pam return call back gave her MD response below. Pt states pt only had about 3 days of antibiotc. She states that her leg looks horrible, its red, but pt is very non-compliance she will not keep her legs elevated. There are not any open area yet, but pt eating whatever she wants and not being compliant...Johny Chess

## 2015-12-22 NOTE — Telephone Encounter (Signed)
Error

## 2015-12-25 ENCOUNTER — Ambulatory Visit: Payer: Medicare Other | Admitting: Family

## 2015-12-25 NOTE — Telephone Encounter (Signed)
Called Pam LMOM w/MD response...Johny Chess

## 2016-01-17 ENCOUNTER — Encounter: Payer: Self-pay | Admitting: Emergency Medicine

## 2016-01-22 ENCOUNTER — Encounter: Payer: Self-pay | Admitting: *Deleted

## 2016-01-22 ENCOUNTER — Emergency Department
Admission: EM | Admit: 2016-01-22 | Discharge: 2016-01-22 | Disposition: A | Discharge status: Home / Self Care | Payer: Medicare Other | Attending: Emergency Medicine | Admitting: Emergency Medicine

## 2016-01-22 ENCOUNTER — Emergency Department: Payer: Medicare Other

## 2016-01-22 DIAGNOSIS — Z87891 Personal history of nicotine dependence: Secondary | ICD-10-CM | POA: Insufficient documentation

## 2016-01-22 DIAGNOSIS — I2510 Atherosclerotic heart disease of native coronary artery without angina pectoris: Secondary | ICD-10-CM | POA: Insufficient documentation

## 2016-01-22 DIAGNOSIS — Z853 Personal history of malignant neoplasm of breast: Secondary | ICD-10-CM | POA: Insufficient documentation

## 2016-01-22 DIAGNOSIS — E785 Hyperlipidemia, unspecified: Secondary | ICD-10-CM | POA: Diagnosis not present

## 2016-01-22 DIAGNOSIS — J9610 Chronic respiratory failure, unspecified whether with hypoxia or hypercapnia: Secondary | ICD-10-CM | POA: Diagnosis not present

## 2016-01-22 DIAGNOSIS — R197 Diarrhea, unspecified: Secondary | ICD-10-CM | POA: Insufficient documentation

## 2016-01-22 DIAGNOSIS — E1149 Type 2 diabetes mellitus with other diabetic neurological complication: Secondary | ICD-10-CM | POA: Diagnosis not present

## 2016-01-22 DIAGNOSIS — I252 Old myocardial infarction: Secondary | ICD-10-CM | POA: Diagnosis not present

## 2016-01-22 DIAGNOSIS — I509 Heart failure, unspecified: Secondary | ICD-10-CM | POA: Diagnosis not present

## 2016-01-22 DIAGNOSIS — E039 Hypothyroidism, unspecified: Secondary | ICD-10-CM | POA: Insufficient documentation

## 2016-01-22 DIAGNOSIS — J441 Chronic obstructive pulmonary disease with (acute) exacerbation: Secondary | ICD-10-CM | POA: Insufficient documentation

## 2016-01-22 DIAGNOSIS — R413 Other amnesia: Secondary | ICD-10-CM | POA: Diagnosis not present

## 2016-01-22 DIAGNOSIS — F329 Major depressive disorder, single episode, unspecified: Secondary | ICD-10-CM | POA: Insufficient documentation

## 2016-01-22 DIAGNOSIS — Z794 Long term (current) use of insulin: Secondary | ICD-10-CM | POA: Diagnosis not present

## 2016-01-22 DIAGNOSIS — R300 Dysuria: Secondary | ICD-10-CM | POA: Insufficient documentation

## 2016-01-22 DIAGNOSIS — Z7951 Long term (current) use of inhaled steroids: Secondary | ICD-10-CM | POA: Diagnosis not present

## 2016-01-22 DIAGNOSIS — Z7982 Long term (current) use of aspirin: Secondary | ICD-10-CM | POA: Diagnosis not present

## 2016-01-22 DIAGNOSIS — R1032 Left lower quadrant pain: Secondary | ICD-10-CM | POA: Diagnosis present

## 2016-01-22 HISTORY — DX: Heart failure, unspecified: I50.9

## 2016-01-22 LAB — COMPREHENSIVE METABOLIC PANEL
ALBUMIN: 3.7 g/dL (ref 3.5–5.0)
ALT: 11 U/L — AB (ref 14–54)
AST: 19 U/L (ref 15–41)
Alkaline Phosphatase: 57 U/L (ref 38–126)
Anion gap: 8 (ref 5–15)
BILIRUBIN TOTAL: 0.6 mg/dL (ref 0.3–1.2)
BUN: 12 mg/dL (ref 6–20)
CO2: 29 mmol/L (ref 22–32)
CREATININE: 0.59 mg/dL (ref 0.44–1.00)
Calcium: 8.9 mg/dL (ref 8.9–10.3)
Chloride: 103 mmol/L (ref 101–111)
GFR calc Af Amer: >60 mL/min (ref >60)
GFR calc non Af Amer: >60 mL/min (ref >60)
GLUCOSE: 127 mg/dL — AB (ref 65–99)
Potassium: 4.2 mmol/L (ref 3.5–5.1)
Sodium: 140 mmol/L (ref 135–145)
Total Protein: 6.9 g/dL (ref 6.5–8.1)

## 2016-01-22 LAB — URINALYSIS COMPLETE WITH MICROSCOPIC (ARMC ONLY)
Bacteria, UA: NONE SEEN
Bilirubin Urine: NEGATIVE
GLUCOSE, UA: NEGATIVE mg/dL
Hgb urine dipstick: NEGATIVE
Leukocytes, UA: NEGATIVE
Nitrite: NEGATIVE
PROTEIN: NEGATIVE mg/dL
SPECIFIC GRAVITY, URINE: 1.01 (ref 1.005–1.030)
pH: 7 (ref 5.0–8.0)

## 2016-01-22 LAB — CBC
HEMATOCRIT: 43.6 % (ref 35.0–47.0)
HEMOGLOBIN: 14.7 g/dL (ref 12.0–16.0)
MCH: 32.5 pg (ref 26.0–34.0)
MCHC: 33.7 g/dL (ref 32.0–36.0)
MCV: 96.5 fL (ref 80.0–100.0)
Platelets: 246 10*3/uL (ref 150–440)
RBC: 4.52 MIL/uL (ref 3.80–5.20)
RDW: 14.2 % (ref 11.5–14.5)
WBC: 6.7 10*3/uL (ref 3.6–11.0)

## 2016-01-22 LAB — LIPASE, BLOOD: Lipase: 20 U/L (ref 11–51)

## 2016-01-22 MED ORDER — SODIUM CHLORIDE 0.9 % IV BOLUS (SEPSIS)
1000.0000 mL | Freq: Once | INTRAVENOUS | Status: AC
  Administered 2016-01-22: 1000 mL via INTRAVENOUS

## 2016-01-22 MED ORDER — ONDANSETRON 4 MG PO TBDP: 4.0000 mg | ORAL_TABLET | Freq: Once | ORAL | Status: DC

## 2016-01-22 MED ORDER — IOPAMIDOL (ISOVUE-300) INJECTION 61%
75.0000 mL | Freq: Once | INTRAVENOUS | Status: AC | PRN
  Administered 2016-01-22: 75 mL via INTRAVENOUS

## 2016-01-22 MED ORDER — IBUPROFEN 600 MG PO TABS
600.0000 mg | ORAL_TABLET | Freq: Once | ORAL | Status: AC
  Administered 2016-01-22: 600 mg via ORAL
  Filled 2016-01-22: qty 1

## 2016-01-22 MED ORDER — ACETAMINOPHEN 500 MG PO TABS
1000.0000 mg | ORAL_TABLET | Freq: Once | ORAL | Status: DC
  Filled 2016-01-22: qty 2

## 2016-01-22 MED ORDER — ONDANSETRON HCL 4 MG/2ML IJ SOLN: 4.0000 mg | Freq: Once | INTRAMUSCULAR | Status: DC

## 2016-01-22 MED ORDER — ONDANSETRON 4 MG PO TBDP
ORAL_TABLET | ORAL | Status: AC
  Filled 2016-01-22: qty 1

## 2016-01-22 NOTE — ED Notes (Signed)
Pt arrives via EMS from [facility] with complaints of LLQ abd pain and painful urination, states she was diagnoised with a kidney infection and given PO clindaymcin, states the abx made her sick so she did not take them, pt arrives covered in stool and urine, foul smelling, states she has been sitting in her own contents for 12 hours, pt on 3L [place] chronic with hx of COPD

## 2016-01-22 NOTE — ED Notes (Signed)
Patient transported to CT 

## 2016-01-22 NOTE — Progress Notes (Addendum)
Clinical Education officer, museum (CSW) received call from MD stating that patient does not meet criteria for admission and can be discharged however patient is requesting a higher level of care. CSW met with patient and her son Ludwig Clarks at bedside. Patient lives at [place]. CSW is familiar with patient from last admission. Patient was placed at [facility] in [place] and 11/09/15. Patient left [facility] and went back to [facility]. CSW made patient and son aware that she will not meet the 3 night inpatient stay criteria so Medicare will not pay for her to go to SNF this time. CSW inquired about why patient left [facility] in March. Per patient she regrets leaving and wishes she would have stayed for long term care. CSW explained that patient can go to SNF under private pay. Per patient she cannot afford to pay for SNF out of pocket. CSW explained that the only option is to return to [facility] with home health and apply for long term care Medicaid. CSW gave patient and son Medicaid information. RN Case Manager will arrange home health. Son and patient were arguing about letting patient's daughter Anne Ng help her apply for Medicaid. Per son patient has pushed her family away so the "state should step in and take care of patient." Patient asked CSW how she could become a ward of the state. CSW explained that the Department of Social Services would have to apply for guardianship, which involves a long court process. CSW also provided patient with a private duty sitter list. Patient and son are agreeable for patient to return to [facility] with home health and apply for long term care Medicaid.   CSW attempted to call Adult Protective Services several times however the line was busy. CSW will continue to call APS to make a report. Patient and son are aware an APS report is being made. RN aware of above. Please reconsult if future social work needs arise. CSW signing off.   CSW called the after  hours APS worker and made a report.   Blima Rich, LCSW [phone]

## 2016-01-22 NOTE — ED Notes (Signed)
Per patient and Dr. Mariea Clonts approval pt IV established on right arm

## 2016-01-22 NOTE — Discharge Instructions (Signed)
Please take a BRAT diet to help with your diarrhea.  Please return to the emergency department for severe headache, inability to keep down fluids, fever, abdominal pain, or any other symptoms concerning to you.

## 2016-01-22 NOTE — ED Notes (Signed)
Pt waiting social work consult before D/C

## 2016-01-22 NOTE — ED Notes (Signed)
Pt returned from CT, resting in bed in no distress, eyes closed

## 2016-01-22 NOTE — ED Provider Notes (Signed)
[facility] Emergency Department Provider Note  ____________________________________________  Time seen: Approximately 12:32 PM  I have reviewed the triage vital signs and the nursing notes.   HISTORY  Chief Complaint Abdominal Pain  History is limited due to patient altered mental status.  HPI Kaitlin Henderson is a 75 y.o. female w/ a hx of breast ca presenting with left lower quadrant abdominal pain, dysuria, and altered mental status. The patient reports that she was given an antibiotic (she doesn't remember the name, but it is clindamycin) for UTI, which gave her diarrhea, so she stopped taking the medication. She does not know what the timeframe of all of this is. Today, she is here because she is experiencing left lower quadrant pain with dysuria. She also reports some confusion. When the paramedics went to get her, she was laying in urine and feces and obviously unable to care for herself.   Past Medical History  Diagnosis Date  . Breast cancer, right breast ([place]) 1998    s/p chemo & XRT; right mastectomy  . Obesity   . Coronary artery disease   . Aphthous stomatitis   . Depression   . Diabetes mellitus, type 2 ([place]) 03/2010  . Hyperlipemia   . COPD (chronic obstructive pulmonary disease) ([place])   . NSTEMI (non-ST elevated myocardial infarction) ([place]) 01/2012  . Breast cancer, right breast ([place]) 1998    s/p chemo & XRT, right mastectomy  . OBESITY   . CORONARY ARTERY DISEASE   . APHTHOUS STOMATITIS   . DEPRESSION     started sertraline 09/2010  . DIABETES, TYPE 2 dx 03/2010  . HYPERLIPIDEMIA   . Hypothyroidism   . C O P D     chronic O2 3LPM [place]  . Essential tremor   . NSTEMI (non-ST elevated myocardial infarction) ([place]) 01/2012    med mgmt  . Anginal pain ([place])   . GERD (gastroesophageal reflux disease)   . CHF (congestive heart failure) [facility])     Patient Active Problem List   Diagnosis Date Noted  . Cellulitis of leg, left 12/12/2015  .  Cellulitis of right lower extremity 11/26/2015  . Lung nodule 11/25/2015  . Anxiety 11/25/2015  . GERD (gastroesophageal reflux disease) 11/25/2015  . Cellulitis of left lower extremity 11/25/2015  . Leukocytosis 11/16/2015  . Cellulitis 11/16/2015  . 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 COPD exacerbation ([place])   . Acute on chronic respiratory failure with hypoxia ([place])   . SOB (shortness of breath) 11/02/2015  . Non-compliant behavior 12/05/2014  . Decreased mobility 08/15/2014  . Benign essential tremor 09/05/2011  . Breast cancer, right breast ([place])   . DEPRESSION 09/25/2010  . HAIR LOSS 06/26/2010  . Type 2 diabetes mellitus with neurological complications ([place]) [id number]  . POLYURIA 03/13/2010  . APHTHOUS STOMATITIS 08/23/2009  . CHRONIC RESPIRATORY FAILURE 08/18/2007  . Hyperlipidemia 08/17/2007  . OBESITY 08/17/2007  . CORONARY ARTERY DISEASE 08/17/2007  . COPD with emphysema, [place]  08/17/2007    Past Surgical History  Procedure Laterality Date  . Right mastectomy    . Appendectomy    . Cholecystectomy    . Abdominal hysterectomy    . Right mastectomy    . Tonsillectomy    . Mastectomy      Current Outpatient Rx  Name  Route  Sig  Dispense  Refill  . albuterol (PROVENTIL) (2.5 MG/3ML) 0.083% nebulizer solution   Nebulization   Take 3 mLs (2.5 mg total) by  nebulization every 6 (six) hours as needed for wheezing or shortness of breath.   75 mL   0   . albuterol (PROVENTIL) (2.5 MG/3ML) 0.083% nebulizer solution   Nebulization   Take 2.5 mg by nebulization every 6 (six) hours as needed for wheezing or shortness of breath.         . ALPRAZolam (XANAX) 0.25 MG tablet   Oral   Take 0.25 mg by mouth 3 (three) times daily as needed for anxiety.         . ALPRAZolam (XANAX) 0.25 MG tablet   Oral   Take 0.5 tablets (0.125 mg total) by mouth 3 (three) times daily as needed for anxiety.   30 tablet   0   . aspirin 325 MG tablet   Oral   Take  650 mg by mouth at bedtime.         [name] atorvastatin (LIPITOR) 40 MG tablet   Oral   Take 40 mg by mouth daily.         . budesonide (PULMICORT) 0.5 MG/2ML nebulizer solution   Nebulization   Take 0.5 mg by nebulization 2 (two) times daily.         . cephALEXin (KEFLEX) 500 MG capsule   Oral   Take 1 capsule (500 mg total) by mouth 4 (four) times daily.   30 capsule   0   . clindamycin (CLEOCIN) 300 MG capsule   Oral   Take 1 capsule (300 mg total) by mouth 3 (three) times daily.   30 capsule   0   . clotrimazole (LOTRIMIN) 1 % cream   Topical   Apply 1 application topically 2 (two) times daily.   30 g   0   . doxycycline (VIBRA-TABS) 100 MG tablet   Oral   Take 100 mg by mouth 2 (two) times daily.         [name] esomeprazole (NEXIUM) 40 MG capsule   Oral   Take 40 mg by mouth daily at 12 noon.         . insulin aspart (NOVOLOG) 100 UNIT/ML injection   Subcutaneous   Inject 7 Units into the skin 3 (three) times daily with meals.         . insulin detemir (LEVEMIR) 100 UNIT/ML injection   Subcutaneous   Inject 15 Units into the skin at bedtime.         . polyethylene glycol (MIRALAX / GLYCOLAX) packet   Oral   Take 17 g by mouth daily as needed.         . predniSONE (DELTASONE) 5 MG tablet      Take 6 tabs PO day 21; 5 tabs PO day 2; 4 tabs PO day 3; 3 tabs PO day 4; 2 tabs PO day 5; 1 tab PO day 6 & 7.         . Probiotic Product (ALIGN) 4 MG CAPS   Oral   Take 1 capsule (4 mg total) by mouth daily.   30 capsule   0   . tiotropium (SPIRIVA) 18 MCG inhalation capsule   Inhalation   Place 1 capsule (18 mcg total) into inhaler and inhale daily.   30 capsule   0   . tiotropium (SPIRIVA) 18 MCG inhalation capsule   Inhalation   Place 18 mcg into inhaler and inhale daily.           Allergies Codeine; Codeine; Levaquin; Levaquin; Sulfa antibiotics; and Sulfonamide derivatives  Family  History  Problem Relation Age of Onset  . Diabetes  Neg Hx   . Cancer Neg Hx     Social History Social History  Substance Use Topics  . Smoking status: Former Smoker -- 2.50 packs/day for 54 years    Types: Cigarettes    Quit date: 09/02/2006  . Smokeless tobacco: Never Used     Comment: Retired. Widow/widower since 2008-lives alone. Home hospice since 06/2009 related to COPD  . Alcohol Use: No    Review of Systems Constitutional: No fever/chills.No lightheadedness or syncope. Positive generalized weakness. Eyes: No visual changes. ENT: No sore throat. No congestion or rhinorrhea. Cardiovascular: Denies chest pain. Denies palpitations. Respiratory: Denies shortness of breath.  No cough. Gastrointestinal: Positive left lower quadrant abdominal pain.  No nausea, no vomiting.  Positive diarrhea.  No constipation. Genitourinary: Positive for dysuria. Musculoskeletal: Negative for back pain. Skin: Negative for rash. Neurological: Negative for headaches. No focal numbness, tingling or weakness. Positive altered mental status.  10-point ROS otherwise negative.  ____________________________________________   PHYSICAL EXAM:  VITAL SIGNS: ED Triage Vitals  Enc Vitals Group     BP 01/22/16 1210 155/92 mmHg     Pulse Rate 01/22/16 1210 96     Resp 01/22/16 1210 20     Temp 01/22/16 1210 98.2 F (36.8 C)     Temp Source 01/22/16 1210 Oral     SpO2 01/22/16 1210 100 %     Weight 01/22/16 1210 180 lb (81.647 kg)     Height 01/22/16 1210 5\' 2"  (1.575 m)     Head Cir --      Peak Flow --      Pain Score 01/22/16 1211 8     Pain Loc --      Pain Edu? --      Excl. in [place]? --     Constitutional: Patient is alert and oriented to month and year. She is disheveled and smells of foul urine and diarrhea, but in no acute distress. She is able to answer most questions but has difficulty giving chronologic answers. Eyes: Conjunctivae are normal.  EOMI. No scleral icterus. Head: Atraumatic. Nose: No congestion/rhinnorhea. Mouth/Throat:  Mucous membranes are moist.  Neck: No stridor.  Supple.  No JVD. Cardiovascular: Normal rate, regular rhythm. No murmurs, rubs or gallops.  Respiratory: Normal respiratory effort.  No accessory muscle use or retractions. Lungs CTAB.  No wheezes, rales or ronchi. Gastrointestinal: Abdomen is obese, soft and nondistended. She has tenderness to palpation in the left lower quadrant. No guarding or rebound.  No peritoneal signs. Musculoskeletal: No LE edema. No ttp in the calves or palpable cords.  Negative Homan's sign. Neurologic:  A&Ox3.  Speech is clear.  Face and smile are symmetric.  EOMI.  Moves all extremities well. Skin:  Skin is warm, dry and intact. No rash noted. Psychiatric: Mood and affect are normal. Speech and behavior are normal.  Normal judgement.  ____________________________________________   LABS (all labs ordered are listed, but only abnormal results are displayed)  Labs Reviewed  COMPREHENSIVE METABOLIC PANEL - Abnormal; Notable for the following:    Glucose, Bld 127 (*)    ALT 11 (*)    All other components within normal limits  URINALYSIS COMPLETEWITH MICROSCOPIC ([facility] ONLY) - Abnormal; Notable for the following:    Color, Urine YELLOW (*)    APPearance CLEAR (*)    Ketones, ur TRACE (*)    Squamous Epithelial / LPF 0-5 (*)    All other components  within normal limits  LIPASE, BLOOD  CBC   ____________________________________________  EKG   Not indicated ____________________________________________  RADIOLOGY  Ct Head W Wo Contrast  01/22/2016  CLINICAL DATA:  75 year old diabetic hypertensive female with recent kidney infection. History of breast cancer with metastatic brain. Initial encounter. EXAM: CT HEAD WITHOUT AND WITH CONTRAST TECHNIQUE: Contiguous axial images were obtained from the base of the skull through the vertex without and with intravenous contrast CONTRAST:  3mL ISOVUE-300 IOPAMIDOL (ISOVUE-300) INJECTION 61% COMPARISON:  None. FINDINGS:  Moderate chronic microvascular changes without CT evidence of large acute infarct or intracranial hemorrhage. No intracranial enhancing lesion or osseous destructive lesion to suggest presence of intracranial metastatic disease. Mild atrophy typical of age without evidence of hydrocephalus. Post right lens replacement. Mastoid air cells, middle ear cavities and visualized paranasal sinuses are clear. IMPRESSION: Moderate chronic microvascular changes without CT evidence of large acute infarct or intracranial hemorrhage. No intracranial enhancing lesion or osseous destructive lesion to suggest presence of intracranial metastatic disease. Electronically Signed   By: Genia Del M.D.   On: 01/22/2016 14:40   Ct Renal Stone Study  01/22/2016  CLINICAL DATA:  Left lower quadrant abdominal pain and painful urination, diagnosed with kidney infection and treated with antibiotics. 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 EXAM: CT ABDOMEN AND PELVIS WITHOUT CONTRAST TECHNIQUE: Multidetector CT imaging of the abdomen and pelvis was performed following the standard protocol without IV contrast. COMPARISON:  None. FINDINGS: Lower chest:  Mild scarring/ atelectasis at each lung base. Hepatobiliary: Status post cholecystectomy.  Liver is unremarkable. Pancreas: No mass or inflammatory process identified on this un-enhanced exam. Spleen: Within normal limits in size. Adrenals/Urinary Tract: Adrenal glands appear normal. Kidneys are unremarkable without stone or hydronephrosis. No perinephric fluid or obvious perinephric inflammation. No ureteral or bladder calculi identified. Bladder is partially decompressed which limits characterization, but appears grossly normal. Stomach/Bowel: There is extensive diverticulosis within the sigmoid and descending colon, with additional scattered diverticulosis throughout the remainder of the colon, but no focal inflammatory change to suggest acute diverticulitis. No large or small  bowel dilatation. Stomach appears normal. Appendix is not seen but there are no inflammatory changes in the right lower quadrant to suggest acute appendicitis. Vascular/Lymphatic: Atherosclerotic changes of the normal- caliber abdominal aorta and pelvic vasculature. No enlarged lymph nodes seen in the abdomen or pelvis. Reproductive: Status post hysterectomy. No mass or fluid collection within either adnexal region. Other: No free fluid or abscess collection seen. No free intraperitoneal air. Musculoskeletal: Mild degenerative change of the thoracolumbar spine. No acute-appearing osseous abnormality. No evidence of osseous metastasis seen. Superficial soft tissues are unremarkable. Heterogeneous density within the lower aspects of the left breast, incompletely imaged, presumably related to the given history of breast cancer. IMPRESSION: 1. No evidence of acute intra-abdominal or intrapelvic abnormality. No free fluid or inflammatory change. No bowel obstruction. No evidence of acute solid organ abnormality. No renal or ureteral calculi. No CT evidence of pyelonephritis, although characterization is limited by the lack of IV contrast. 2. Colonic diverticulosis without evidence of acute diverticulitis. Electronically Signed   By: Franki Cabot M.D.   On: 01/22/2016 14:19    ____________________________________________   PROCEDURES  Procedure(s) performed: None  Critical Care performed: No ____________________________________________   INITIAL IMPRESSION / ASSESSMENT AND PLAN / ED COURSE  Pertinent labs & imaging results that were available during my care of the patient were reviewed by me and considered in my medical decision making (see chart for details).  75 y.o. female presenting soaked in urine and feces with altered mental status. I'll check the patient for urinary tract infection, also an intra-abdominal infection given her left lower quadrant pain. We'll look for left lateral abnormalities  or any acute stroke although there are no focal neurologic deficits.  ----------------------------------------- 2:44 PM on 01/22/2016 -----------------------------------------  The patient's workup in the emergency department is grossly reassuring. She has normal electrolyte, no evidence of UTI, no abnormalities in the CT of her head, nor any acute findings on the CT of her abdomen. Her son has arrived who describes a history of months to years of progressive memory decline. The patient becomes agitated and states "it's not dementia!"  I've had a long discussion with both the patient and her son describing that I have not found any medical causes for admission to the hospital today. There is a injury or family dispute about the patient's children staying with the patient. She does agree that she needs a higher level of care and is no longer safe to live in independent living, but her son is refusing to come help her unless she also agrees that her daughter can help. And she does not agree to this. I have talked to the social worker, who knows this family well, and has provided transition to rehabilitation in the past. It sounds like in the past the patient left rehabilitation [place]. The social worker is coming back down to talk to the patient and we will plan discharge. I hope that the family will be able to work it out to keep the patient safe.  ____________________________________________  FINAL CLINICAL IMPRESSION(S) / ED DIAGNOSES  Final diagnoses:  Memory impairment  Diarrhea, unspecified type  Dysuria      NEW MEDICATIONS STARTED DURING THIS VISIT:  New Prescriptions   No medications on file     Eula Listen, MD 01/22/16 1554

## 2016-01-23 NOTE — Care Management Note (Signed)
Case Management Note  Patient Details  Name: NETTA BRETZ MRN: [id number] Date of Birth: July 17, 1941  Subjective/Objective:  Have called pt. To set up [facility], nursing, PT OT and CSW. Patient says she haas a CSW and provided the number from Orion Modest at [employer]. She saw the CSW yesterday at home. I left VM with Reesha to make sure we are not duplicating services.[phone].                  Action/Plan:   Expected Discharge Date:                  Expected Discharge Plan:     In-House Referral:     Discharge planning Services     Post Acute Care Choice:    Choice offered to:     DME Arranged:    DME Agency:     HH Arranged:    [place] Agency:     Status of Service:     Medicare Important Message Given:    Date Medicare IM Given:    Medicare IM give by:    Date Additional Medicare IM Given:    Additional Medicare Important Message give by:     If discussed at [place] of Stay Meetings, dates discussed:    Additional Comments:  Beau Fanny, RN 01/23/2016, 8:31 AM

## 2016-01-23 NOTE — Care Management Note (Signed)
Case Management Note  Patient Details  Name: Kaitlin Henderson MRN: [id number] Date of Birth: June 08, 1941  Subjective/Objective:   Contacted Karen at [facility] to make referral for Home services, and touched base with Reesha from [place]. APS was notified and a case is open on patient. Information faxed to [place].                 Action/Plan:   Expected Discharge Date:                  Expected Discharge Plan:     In-House Referral:     Discharge planning Services     Post Acute Care Choice:    Choice offered to:     DME Arranged:    DME Agency:     HH Arranged:    [place] Agency:     Status of Service:     Medicare Important Message Given:    Date Medicare IM Given:    Medicare IM give by:    Date Additional Medicare IM Given:    Additional Medicare Important Message give by:     If discussed at [place] of Stay Meetings, dates discussed:    Additional Comments:  Beau Fanny, RN 01/23/2016, 9:18 AM

## 2016-01-31 ENCOUNTER — Encounter: Payer: Self-pay | Admitting: Emergency Medicine

## 2016-01-31 ENCOUNTER — Emergency Department: Payer: Medicare Other

## 2016-01-31 ENCOUNTER — Emergency Department
Admission: EM | Admit: 2016-01-31 | Discharge: 2016-01-31 | Disposition: A | Discharge status: Home / Self Care | Payer: Medicare Other | Attending: Emergency Medicine | Admitting: Emergency Medicine

## 2016-01-31 DIAGNOSIS — J441 Chronic obstructive pulmonary disease with (acute) exacerbation: Secondary | ICD-10-CM | POA: Insufficient documentation

## 2016-01-31 DIAGNOSIS — E785 Hyperlipidemia, unspecified: Secondary | ICD-10-CM | POA: Insufficient documentation

## 2016-01-31 DIAGNOSIS — I25119 Atherosclerotic heart disease of native coronary artery with unspecified angina pectoris: Secondary | ICD-10-CM | POA: Insufficient documentation

## 2016-01-31 DIAGNOSIS — R1032 Left lower quadrant pain: Secondary | ICD-10-CM | POA: Diagnosis present

## 2016-01-31 DIAGNOSIS — R197 Diarrhea, unspecified: Secondary | ICD-10-CM | POA: Diagnosis not present

## 2016-01-31 DIAGNOSIS — E119 Type 2 diabetes mellitus without complications: Secondary | ICD-10-CM | POA: Diagnosis not present

## 2016-01-31 DIAGNOSIS — Z794 Long term (current) use of insulin: Secondary | ICD-10-CM | POA: Diagnosis not present

## 2016-01-31 DIAGNOSIS — Z7982 Long term (current) use of aspirin: Secondary | ICD-10-CM | POA: Insufficient documentation

## 2016-01-31 DIAGNOSIS — I252 Old myocardial infarction: Secondary | ICD-10-CM | POA: Diagnosis not present

## 2016-01-31 DIAGNOSIS — E039 Hypothyroidism, unspecified: Secondary | ICD-10-CM | POA: Diagnosis not present

## 2016-01-31 DIAGNOSIS — E1149 Type 2 diabetes mellitus with other diabetic neurological complication: Secondary | ICD-10-CM | POA: Diagnosis not present

## 2016-01-31 DIAGNOSIS — Z87891 Personal history of nicotine dependence: Secondary | ICD-10-CM | POA: Diagnosis not present

## 2016-01-31 DIAGNOSIS — I509 Heart failure, unspecified: Secondary | ICD-10-CM | POA: Insufficient documentation

## 2016-01-31 DIAGNOSIS — Z79899 Other long term (current) drug therapy: Secondary | ICD-10-CM | POA: Insufficient documentation

## 2016-01-31 DIAGNOSIS — J9610 Chronic respiratory failure, unspecified whether with hypoxia or hypercapnia: Secondary | ICD-10-CM | POA: Diagnosis not present

## 2016-01-31 DIAGNOSIS — Z853 Personal history of malignant neoplasm of breast: Secondary | ICD-10-CM | POA: Diagnosis not present

## 2016-01-31 DIAGNOSIS — F329 Major depressive disorder, single episode, unspecified: Secondary | ICD-10-CM | POA: Diagnosis not present

## 2016-01-31 DIAGNOSIS — R109 Unspecified abdominal pain: Secondary | ICD-10-CM

## 2016-01-31 LAB — GASTROINTESTINAL PANEL BY PCR, STOOL (REPLACES STOOL CULTURE)
ADENOVIRUS F40/41: NOT DETECTED
Astrovirus: NOT DETECTED
CAMPYLOBACTER SPECIES: NOT DETECTED
Cryptosporidium: NOT DETECTED
Cyclospora cayetanensis: NOT DETECTED
E. coli O157: NOT DETECTED
ENTEROAGGREGATIVE E COLI (EAEC): NOT DETECTED
ENTEROPATHOGENIC E COLI (EPEC): NOT DETECTED
ENTEROTOXIGENIC E COLI (ETEC): NOT DETECTED
Entamoeba histolytica: NOT DETECTED
GIARDIA LAMBLIA: NOT DETECTED
NOROVIRUS GI/GII: NOT DETECTED
PLESIMONAS SHIGELLOIDES: NOT DETECTED
ROTAVIRUS A: NOT DETECTED
SHIGA LIKE TOXIN PRODUCING E COLI (STEC): NOT DETECTED
SHIGELLA/ENTEROINVASIVE E COLI (EIEC): NOT DETECTED
Salmonella species: NOT DETECTED
Sapovirus (I, II, IV, and V): NOT DETECTED
Vibrio cholerae: NOT DETECTED
Vibrio species: NOT DETECTED
Yersinia enterocolitica: NOT DETECTED

## 2016-01-31 LAB — CBC
HEMATOCRIT: 44.3 % (ref 35.0–47.0)
Hemoglobin: 15 g/dL (ref 12.0–16.0)
MCH: 32.4 pg (ref 26.0–34.0)
MCHC: 34 g/dL (ref 32.0–36.0)
MCV: 95.3 fL (ref 80.0–100.0)
Platelets: 227 10*3/uL (ref 150–440)
RBC: 4.65 MIL/uL (ref 3.80–5.20)
RDW: 13.6 % (ref 11.5–14.5)
WBC: 8.9 10*3/uL (ref 3.6–11.0)

## 2016-01-31 LAB — URINALYSIS COMPLETE WITH MICROSCOPIC (ARMC ONLY)
Bilirubin Urine: NEGATIVE
GLUCOSE, UA: NEGATIVE mg/dL
Ketones, ur: NEGATIVE mg/dL
NITRITE: NEGATIVE
PROTEIN: NEGATIVE mg/dL
SPECIFIC GRAVITY, URINE: 1.03 (ref 1.005–1.030)
WBC UA: NONE SEEN WBC/hpf (ref 0–5)
pH: 5 (ref 5.0–8.0)

## 2016-01-31 LAB — C DIFFICILE QUICK SCREEN W PCR REFLEX
C DIFFICILE (CDIFF) INTERP: NEGATIVE
C DIFFICILE (CDIFF) TOXIN: NEGATIVE
C DIFFICLE (CDIFF) ANTIGEN: NEGATIVE

## 2016-01-31 LAB — COMPREHENSIVE METABOLIC PANEL
ALT: 11 U/L — ABNORMAL LOW (ref 14–54)
AST: 18 U/L (ref 15–41)
Albumin: 4 g/dL (ref 3.5–5.0)
Alkaline Phosphatase: 62 U/L (ref 38–126)
Anion gap: 5 (ref 5–15)
BUN: 20 mg/dL (ref 6–20)
CHLORIDE: 101 mmol/L (ref 101–111)
CO2: 36 mmol/L — ABNORMAL HIGH (ref 22–32)
Calcium: 9.1 mg/dL (ref 8.9–10.3)
Creatinine, Ser: 0.92 mg/dL (ref 0.44–1.00)
GFR calc Af Amer: >60 mL/min (ref >60)
GFR, EST NON AFRICAN AMERICAN: 59 mL/min — AB (ref >60)
Glucose, Bld: 153 mg/dL — ABNORMAL HIGH (ref 65–99)
POTASSIUM: 3.7 mmol/L (ref 3.5–5.1)
Sodium: 142 mmol/L (ref 135–145)
Total Bilirubin: 0.6 mg/dL (ref 0.3–1.2)
Total Protein: 7.2 g/dL (ref 6.5–8.1)

## 2016-01-31 LAB — LIPASE, BLOOD: LIPASE: 22 U/L (ref 11–51)

## 2016-01-31 MED ORDER — DIATRIZOATE MEGLUMINE & SODIUM 66-10 % PO SOLN
15.0000 mL | Freq: Once | ORAL | Status: AC
  Administered 2016-01-31: 15 mL via ORAL

## 2016-01-31 MED ORDER — SODIUM CHLORIDE 0.9 % IV BOLUS (SEPSIS)
1000.0000 mL | Freq: Once | INTRAVENOUS | Status: AC
  Administered 2016-01-31: 1000 mL via INTRAVENOUS

## 2016-01-31 MED ORDER — IBUPROFEN 600 MG PO TABS
600.0000 mg | ORAL_TABLET | Freq: Once | ORAL | Status: AC
  Administered 2016-01-31: 600 mg via ORAL
  Filled 2016-01-31: qty 1

## 2016-01-31 MED ORDER — IOPAMIDOL (ISOVUE-300) INJECTION 61%
100.0000 mL | Freq: Once | INTRAVENOUS | Status: AC | PRN
  Administered 2016-01-31: 100 mL via INTRAVENOUS

## 2016-01-31 NOTE — ED Notes (Signed)
Discharge instructions reviewed with patient. Patient verbalized understanding. Patient discharged back to [facility] via stretcher in ambulance

## 2016-01-31 NOTE — ED Provider Notes (Addendum)
[facility] Emergency Department Provider Note  Time seen: 5:43 PM  I have reviewed the triage vital signs and the nursing notes.   HISTORY  Chief Complaint Abdominal Pain and Diarrhea    HPI Kaitlin Henderson is a 75 y.o. female with a past medical history of diabetes, COPD on 5 L nasal cannula 24/7, hyperlipidemia, NSTEMI, CHF, presents the emergency department with left lower quadrant abdominal pain and diarrhea. According to the patient and EMS reported the patient's from a nursing facility, has been experiencing significant wheeze bowel movements today and complaining of left lower quadrant pain. Patient states left lower quadrant pain has been present for some time but cannot quantify how long. Describes as a 7/10 dull pain in the left lower quadrant. Denies any nausea or vomiting. Denies any dysuria or fever. Patient does have a strong smell of urine. Patient does not wish for any pain medication at this time but does state 7/10 pain.     Past Medical History  Diagnosis Date  . Breast cancer, right breast ([place]) 1998    s/p chemo & XRT; right mastectomy  . Obesity   . Coronary artery disease   . Aphthous stomatitis   . Depression   . Diabetes mellitus, type 2 ([place]) 03/2010  . Hyperlipemia   . COPD (chronic obstructive pulmonary disease) ([place])   . NSTEMI (non-ST elevated myocardial infarction) ([place]) 01/2012  . Breast cancer, right breast ([place]) 1998    s/p chemo & XRT, right mastectomy  . OBESITY   . CORONARY ARTERY DISEASE   . APHTHOUS STOMATITIS   . DEPRESSION     started sertraline 09/2010  . DIABETES, TYPE 2 dx 03/2010  . HYPERLIPIDEMIA   . Hypothyroidism   . C O P D     chronic O2 3LPM [place]  . Essential tremor   . NSTEMI (non-ST elevated myocardial infarction) ([place]) 01/2012    med mgmt  . Anginal pain ([place])   . GERD (gastroesophageal reflux disease)   . CHF (congestive heart failure) [facility])     Patient Active Problem List   Diagnosis Date  Noted  . Cellulitis of leg, left 12/12/2015  . Cellulitis of right lower extremity 11/26/2015  . Lung nodule 11/25/2015  . Anxiety 11/25/2015  . GERD (gastroesophageal reflux disease) 11/25/2015  . Cellulitis of left lower extremity 11/25/2015  . Leukocytosis 11/16/2015  . Cellulitis 11/16/2015  . 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 COPD exacerbation ([place])   . Acute on chronic respiratory failure with hypoxia ([place])   . SOB (shortness of breath) 11/02/2015  . Non-compliant behavior 12/05/2014  . Decreased mobility 08/15/2014  . Benign essential tremor 09/05/2011  . Breast cancer, right breast ([place])   . DEPRESSION 09/25/2010  . HAIR LOSS 06/26/2010  . Type 2 diabetes mellitus with neurological complications ([place]) [id number]  . POLYURIA 03/13/2010  . APHTHOUS STOMATITIS 08/23/2009  . CHRONIC RESPIRATORY FAILURE 08/18/2007  . Hyperlipidemia 08/17/2007  . OBESITY 08/17/2007  . CORONARY ARTERY DISEASE 08/17/2007  . COPD with emphysema, Gold D  08/17/2007    Past Surgical History  Procedure Laterality Date  . Right mastectomy    . Appendectomy    . Cholecystectomy    . Abdominal hysterectomy    . Right mastectomy    . Tonsillectomy    . Mastectomy      Current Outpatient Rx  Name  Route  Sig  Dispense  Refill  . albuterol (PROVENTIL) (2.5 MG/3ML) 0.083% nebulizer solution  Nebulization   Take 3 mLs (2.5 mg total) by nebulization every 6 (six) hours as needed for wheezing or shortness of breath.   75 mL   0   . albuterol (PROVENTIL) (2.5 MG/3ML) 0.083% nebulizer solution   Nebulization   Take 2.5 mg by nebulization every 6 (six) hours as needed for wheezing or shortness of breath.         . ALPRAZolam (XANAX) 0.25 MG tablet   Oral   Take 0.25 mg by mouth 3 (three) times daily as needed for anxiety.         . ALPRAZolam (XANAX) 0.25 MG tablet   Oral   Take 0.5 tablets (0.125 mg total) by mouth 3 (three) times daily as needed for anxiety.   30  tablet   0   . aspirin 325 MG tablet   Oral   Take 650 mg by mouth at bedtime.         [name] atorvastatin (LIPITOR) 40 MG tablet   Oral   Take 40 mg by mouth daily.         . budesonide (PULMICORT) 0.5 MG/2ML nebulizer solution   Nebulization   Take 0.5 mg by nebulization 2 (two) times daily.         . cephALEXin (KEFLEX) 500 MG capsule   Oral   Take 1 capsule (500 mg total) by mouth 4 (four) times daily.   30 capsule   0   . clindamycin (CLEOCIN) 300 MG capsule   Oral   Take 1 capsule (300 mg total) by mouth 3 (three) times daily.   30 capsule   0   . clotrimazole (LOTRIMIN) 1 % cream   Topical   Apply 1 application topically 2 (two) times daily.   30 g   0   . doxycycline (VIBRA-TABS) 100 MG tablet   Oral   Take 100 mg by mouth 2 (two) times daily.         [name] esomeprazole (NEXIUM) 40 MG capsule   Oral   Take 40 mg by mouth daily at 12 noon.         . insulin aspart (NOVOLOG) 100 UNIT/ML injection   Subcutaneous   Inject 7 Units into the skin 3 (three) times daily with meals.         . insulin detemir (LEVEMIR) 100 UNIT/ML injection   Subcutaneous   Inject 15 Units into the skin at bedtime.         . polyethylene glycol (MIRALAX / GLYCOLAX) packet   Oral   Take 17 g by mouth daily as needed.         . predniSONE (DELTASONE) 5 MG tablet      Take 6 tabs PO day 21; 5 tabs PO day 2; 4 tabs PO day 3; 3 tabs PO day 4; 2 tabs PO day 5; 1 tab PO day 6 & 7.         . Probiotic Product (ALIGN) 4 MG CAPS   Oral   Take 1 capsule (4 mg total) by mouth daily.   30 capsule   0   . tiotropium (SPIRIVA) 18 MCG inhalation capsule   Inhalation   Place 1 capsule (18 mcg total) into inhaler and inhale daily.   30 capsule   0   . tiotropium (SPIRIVA) 18 MCG inhalation capsule   Inhalation   Place 18 mcg into inhaler and inhale daily.           Allergies Codeine;  Codeine; Levaquin; Levaquin; Sulfa antibiotics; and Sulfonamide derivatives  Family  History  Problem Relation Age of Onset  . Diabetes Neg Hx   . Cancer Neg Hx     Social History Social History  Substance Use Topics  . Smoking status: Former Smoker -- 2.50 packs/day for 54 years    Types: Cigarettes    Quit date: 09/02/1997  . Smokeless tobacco: Never Used     Comment: Retired. Widow/widower since 2008-lives alone. Home hospice since 06/2009 related to COPD  . Alcohol Use: No    Review of Systems Constitutional: Negative for fever. Cardiovascular: Negative for chest pain. Respiratory: Negative for shortness of breath. Gastrointestinal: Left lower quadrant abdominal pain. Negative for nausea or vomiting. Positive for diarrhea. Negative for black or bloody stool. Genitourinary: Negative for dysuria. Musculoskeletal: Negative for back pain Neurological: Negative for headache 10-point ROS otherwise negative.  ____________________________________________   PHYSICAL EXAM:  VITAL SIGNS: ED Triage Vitals  Enc Vitals Group     BP 01/31/16 1736 144/79 mmHg     Pulse Rate 01/31/16 1736 101     Resp 01/31/16 1736 24     Temp --      Temp src --      SpO2 01/31/16 1719 92 %     Weight --      Height --      Head Cir --      Peak Flow --      Pain Score --      Pain Loc --      Pain Edu? --      Excl. in [place]? --     Constitutional: Alert. Well appearing and in no distress.Answers questions appropriately, follows all commands. Eyes: Normal exam ENT   Head: Normocephalic and atraumatic.   Mouth/Throat: Mucous membranes are moist. Cardiovascular: Normal rate, regular rhythm.  Respiratory: Normal respiratory effort without tachypnea nor retractions.  Gastrointestinal: Soft, moderate suprapubic and left lower quadrant tenderness palpation without rebound or guarding. No distention. Musculoskeletal: Nontender with normal range of motion in all extremities. Neurologic:  Normal speech and language. No gross focal neurologic deficits Skin:  Skin is warm,  dry and intact.  Psychiatric: Mood and affect are normal.  ____________________________________________    EKG  EKG reviewed and interpreted by myself shows sinus tachycardia at 105 bpm, widened QRS, prolonged QTC of 500 ms, nonspecific ST changes without ST elevation. Disagree with computer read of STEMI.  ____________________________________________    RADIOLOGY  CT pending  ____________________________________________   INITIAL IMPRESSION / ASSESSMENT AND PLAN / ED COURSE  Pertinent labs & imaging results that were available during my care of the patient were reviewed by me and considered in my medical decision making (see chart for details).  The patient presents to the emergency department left lower quadrant abdominal pain and diarrhea. Patient doesn't moderate tenderness palpation of the suprapubic left lower quadrant regions. Patient has a significant urine smell. Denies nausea or vomiting. Denies fever. Patient describes her pain as 7/10 but does not wish for anything for pain. We will check labs, IV hydrate, send stool culture and C. difficile test. We'll place the patient on enteric precautions until results are known. We will obtain a CT scan given the moderate left lower quadrant tenderness to palpation rule out diverticulitis or colitis. The patient has a history of diverticulitis in the past per patient.  The patient's labs are largely within normal limits including her chemistry and CBC. Her C. difficile test is negative. Her  stool culture is pending. Her CT scan is pending. Patient continues to state some discomfort, but does not wish for any pain medications.  CT shows no acute abnormality. Labs are largely within normal limits, urinalysis pending. Patient care signed out to Dr. Joni Fears.  ____________________________________________   FINAL CLINICAL IMPRESSION(S) / ED DIAGNOSES  Left lower quadrant pain Diarrhea   Harvest Dark, MD 01/31/16  2010  Harvest Dark, MD 01/31/16 2047

## 2016-01-31 NOTE — ED Notes (Addendum)
Pt. Arrives via [facility] with c/o LLQ abd pain 8/10 and consistent diarrhea. Pt. Is from [facility], wheelchair bound. Hx of COPD on 5L O2 @home , 92% with EMS. EMS reports strong urine, UTI odor.   Pt. Arriving soiled, BM during initial assessment. Pt. Reports pain 0/10 and comfortable.

## 2016-01-31 NOTE — ED Notes (Signed)
Pt has oral contrast at bedside.

## 2016-01-31 NOTE — Discharge Instructions (Signed)

## 2016-01-31 NOTE — ED Provider Notes (Signed)
Vital signs stable. Workup entirely negative. CT negative. Urinalysis unremarkable. We'll discharge home, follow up with primary care doctor.  Carrie Mew, MD 01/31/16 [phone]

## 2016-01-31 NOTE — ED Notes (Signed)
Patient transported to CT 

## 2016-02-02 ENCOUNTER — Telehealth: Payer: Self-pay | Admitting: Internal Medicine

## 2016-02-02 NOTE — Telephone Encounter (Signed)
Pt called request FL2 form fill out. Advise pt that we need the form from the place she want to go and we can fill it out. Pt last ov was 09/2014, she might need an appt to come in because in order to fill out form we have to see her within 90 days. Pt stated she will have to daughter call us

## 2016-02-19 ENCOUNTER — Encounter: Payer: Self-pay | Admitting: Emergency Medicine

## 2016-02-19 ENCOUNTER — Emergency Department: Payer: Medicare Other

## 2016-02-19 ENCOUNTER — Inpatient Hospital Stay
Admission: EM | Admit: 2016-02-19 | Discharge: 2016-02-23 | DRG: 872 | Disposition: A | Discharge status: Skilled Nursing Facility | Payer: Medicare Other | Attending: Internal Medicine | Admitting: Internal Medicine

## 2016-02-19 DIAGNOSIS — F329 Major depressive disorder, single episode, unspecified: Secondary | ICD-10-CM | POA: Diagnosis present

## 2016-02-19 DIAGNOSIS — Z9981 Dependence on supplemental oxygen: Secondary | ICD-10-CM

## 2016-02-19 DIAGNOSIS — J449 Chronic obstructive pulmonary disease, unspecified: Secondary | ICD-10-CM | POA: Diagnosis present

## 2016-02-19 DIAGNOSIS — L03116 Cellulitis of left lower limb: Secondary | ICD-10-CM | POA: Diagnosis present

## 2016-02-19 DIAGNOSIS — R238 Other skin changes: Secondary | ICD-10-CM

## 2016-02-19 DIAGNOSIS — A419 Sepsis, unspecified organism: Principal | ICD-10-CM | POA: Diagnosis present

## 2016-02-19 DIAGNOSIS — Z7982 Long term (current) use of aspirin: Secondary | ICD-10-CM

## 2016-02-19 DIAGNOSIS — L909 Atrophic disorder of skin, unspecified: Secondary | ICD-10-CM

## 2016-02-19 DIAGNOSIS — E11628 Type 2 diabetes mellitus with other skin complications: Secondary | ICD-10-CM | POA: Diagnosis present

## 2016-02-19 DIAGNOSIS — E876 Hypokalemia: Secondary | ICD-10-CM | POA: Diagnosis present

## 2016-02-19 DIAGNOSIS — F419 Anxiety disorder, unspecified: Secondary | ICD-10-CM | POA: Diagnosis present

## 2016-02-19 DIAGNOSIS — K219 Gastro-esophageal reflux disease without esophagitis: Secondary | ICD-10-CM | POA: Diagnosis present

## 2016-02-19 DIAGNOSIS — L0390 Cellulitis, unspecified: Secondary | ICD-10-CM | POA: Diagnosis present

## 2016-02-19 DIAGNOSIS — Z993 Dependence on wheelchair: Secondary | ICD-10-CM

## 2016-02-19 DIAGNOSIS — I252 Old myocardial infarction: Secondary | ICD-10-CM

## 2016-02-19 DIAGNOSIS — G250 Essential tremor: Secondary | ICD-10-CM | POA: Diagnosis present

## 2016-02-19 DIAGNOSIS — E039 Hypothyroidism, unspecified: Secondary | ICD-10-CM | POA: Diagnosis present

## 2016-02-19 DIAGNOSIS — E119 Type 2 diabetes mellitus without complications: Secondary | ICD-10-CM | POA: Diagnosis present

## 2016-02-19 DIAGNOSIS — Z794 Long term (current) use of insulin: Secondary | ICD-10-CM

## 2016-02-19 DIAGNOSIS — R609 Edema, unspecified: Secondary | ICD-10-CM

## 2016-02-19 DIAGNOSIS — L8990 Pressure ulcer of unspecified site, unspecified stage: Secondary | ICD-10-CM | POA: Insufficient documentation

## 2016-02-19 DIAGNOSIS — Z9049 Acquired absence of other specified parts of digestive tract: Secondary | ICD-10-CM

## 2016-02-19 DIAGNOSIS — E785 Hyperlipidemia, unspecified: Secondary | ICD-10-CM | POA: Diagnosis present

## 2016-02-19 DIAGNOSIS — Z7984 Long term (current) use of oral hypoglycemic drugs: Secondary | ICD-10-CM

## 2016-02-19 DIAGNOSIS — L03115 Cellulitis of right lower limb: Secondary | ICD-10-CM | POA: Diagnosis present

## 2016-02-19 DIAGNOSIS — Z7951 Long term (current) use of inhaled steroids: Secondary | ICD-10-CM

## 2016-02-19 DIAGNOSIS — Z882 Allergy status to sulfonamides status: Secondary | ICD-10-CM

## 2016-02-19 DIAGNOSIS — R911 Solitary pulmonary nodule: Secondary | ICD-10-CM

## 2016-02-19 DIAGNOSIS — Z853 Personal history of malignant neoplasm of breast: Secondary | ICD-10-CM

## 2016-02-19 DIAGNOSIS — Z66 Do not resuscitate: Secondary | ICD-10-CM | POA: Diagnosis present

## 2016-02-19 DIAGNOSIS — Z79899 Other long term (current) drug therapy: Secondary | ICD-10-CM

## 2016-02-19 DIAGNOSIS — Z9011 Acquired absence of right breast and nipple: Secondary | ICD-10-CM

## 2016-02-19 DIAGNOSIS — E669 Obesity, unspecified: Secondary | ICD-10-CM | POA: Diagnosis present

## 2016-02-19 DIAGNOSIS — J9610 Chronic respiratory failure, unspecified whether with hypoxia or hypercapnia: Secondary | ICD-10-CM | POA: Diagnosis present

## 2016-02-19 DIAGNOSIS — I2510 Atherosclerotic heart disease of native coronary artery without angina pectoris: Secondary | ICD-10-CM | POA: Diagnosis present

## 2016-02-19 DIAGNOSIS — Z87891 Personal history of nicotine dependence: Secondary | ICD-10-CM

## 2016-02-19 DIAGNOSIS — I110 Hypertensive heart disease with heart failure: Secondary | ICD-10-CM | POA: Diagnosis present

## 2016-02-19 DIAGNOSIS — I509 Heart failure, unspecified: Secondary | ICD-10-CM | POA: Diagnosis present

## 2016-02-19 DIAGNOSIS — Z881 Allergy status to other antibiotic agents status: Secondary | ICD-10-CM

## 2016-02-19 DIAGNOSIS — Z885 Allergy status to narcotic agent status: Secondary | ICD-10-CM

## 2016-02-19 DIAGNOSIS — Z9221 Personal history of antineoplastic chemotherapy: Secondary | ICD-10-CM

## 2016-02-19 LAB — URINALYSIS COMPLETE WITH MICROSCOPIC (ARMC ONLY)
Bilirubin Urine: NEGATIVE
Glucose, UA: NEGATIVE mg/dL
Hgb urine dipstick: NEGATIVE
Leukocytes, UA: NEGATIVE
Nitrite: NEGATIVE
PH: 5 (ref 5.0–8.0)
PROTEIN: NEGATIVE mg/dL
SPECIFIC GRAVITY, URINE: 1.023 (ref 1.005–1.030)

## 2016-02-19 LAB — CBC WITH DIFFERENTIAL/PLATELET
Basophils Absolute: 0 10*3/uL (ref 0–0.1)
Basophils Relative: 0 %
EOS ABS: 0.2 10*3/uL (ref 0–0.7)
Eosinophils Relative: 1 %
HEMATOCRIT: 43.4 % (ref 35.0–47.0)
HEMOGLOBIN: 14.5 g/dL (ref 12.0–16.0)
LYMPHS ABS: 1.8 10*3/uL (ref 1.0–3.6)
MCH: 31.6 pg (ref 26.0–34.0)
MCHC: 33.3 g/dL (ref 32.0–36.0)
MCV: 94.8 fL (ref 80.0–100.0)
Monocytes Absolute: 1 10*3/uL — ABNORMAL HIGH (ref 0.2–0.9)
NEUTROS ABS: 9.6 10*3/uL — AB (ref 1.4–6.5)
Platelets: 352 10*3/uL (ref 150–440)
RBC: 4.58 MIL/uL (ref 3.80–5.20)
RDW: 12.9 % (ref 11.5–14.5)
WBC: 12.5 10*3/uL — AB (ref 3.6–11.0)

## 2016-02-19 LAB — COMPREHENSIVE METABOLIC PANEL
ALBUMIN: 3.4 g/dL — AB (ref 3.5–5.0)
ALT: 12 U/L — AB (ref 14–54)
ANION GAP: 11 (ref 5–15)
AST: 21 U/L (ref 15–41)
Alkaline Phosphatase: 71 U/L (ref 38–126)
BUN: 15 mg/dL (ref 6–20)
CALCIUM: 9.1 mg/dL (ref 8.9–10.3)
CHLORIDE: 93 mmol/L — AB (ref 101–111)
CO2: 34 mmol/L — AB (ref 22–32)
Creatinine, Ser: 0.86 mg/dL (ref 0.44–1.00)
GFR calc non Af Amer: >60 mL/min (ref >60)
GLUCOSE: 151 mg/dL — AB (ref 65–99)
POTASSIUM: 4 mmol/L (ref 3.5–5.1)
SODIUM: 138 mmol/L (ref 135–145)
Total Bilirubin: 0.8 mg/dL (ref 0.3–1.2)
Total Protein: 7.3 g/dL (ref 6.5–8.1)

## 2016-02-19 LAB — LACTIC ACID, PLASMA: LACTIC ACID, VENOUS: 2 mmol/L (ref 0.5–2.0)

## 2016-02-19 LAB — TROPONIN I: Troponin I: <0.03 ng/mL (ref <0.031)

## 2016-02-19 LAB — CK: CK TOTAL: 67 U/L (ref 38–234)

## 2016-02-19 MED ORDER — VANCOMYCIN HCL IN DEXTROSE 1-5 GM/200ML-% IV SOLN
1000.0000 mg | Freq: Once | INTRAVENOUS | Status: AC
  Administered 2016-02-19: 1000 mg via INTRAVENOUS
  Filled 2016-02-19: qty 200

## 2016-02-19 MED ORDER — SODIUM CHLORIDE 0.9 % IV BOLUS (SEPSIS)
1000.0000 mL | Freq: Once | INTRAVENOUS | Status: AC
  Administered 2016-02-19: 1000 mL via INTRAVENOUS

## 2016-02-19 MED ORDER — BENZONATATE 100 MG PO CAPS
100.0000 mg | ORAL_CAPSULE | Freq: Once | ORAL | Status: AC
  Administered 2016-02-19: 100 mg via ORAL

## 2016-02-19 MED ORDER — SODIUM CHLORIDE 0.9 % IV BOLUS (SEPSIS)
1000.0000 mL | Freq: Once | INTRAVENOUS | Status: AC
  Administered 2016-02-20: 03:00:00 1000 mL via INTRAVENOUS

## 2016-02-19 MED ORDER — PIPERACILLIN-TAZOBACTAM 3.375 G IVPB 30 MIN
3.3750 g | Freq: Once | INTRAVENOUS | Status: AC
  Administered 2016-02-19: 3.375 g via INTRAVENOUS
  Filled 2016-02-19: qty 50

## 2016-02-19 MED ORDER — BENZONATATE 100 MG PO CAPS
ORAL_CAPSULE | ORAL | Status: AC
  Filled 2016-02-19: qty 1

## 2016-02-19 NOTE — ED Notes (Signed)
Assisted Larene Beach RN and Ebony Hail RN with the in and out catheter.

## 2016-02-19 NOTE — ED Notes (Signed)
Pt arriving via [facility] from [facility] where she was found lying in her waste for what she recalls to be the past 3-5 days. EMS report that maggots were present in the feces and the mattress had begun to conform and deteriorate from the waste present. EMS also reports that pt. Had a DNR and ripped it in front of them when they were there and stated she "wants everything to be done if needed", they were unable to obtain vitals en route. Pt. Has multiple areas of skin breakdown present on mainly the right side. Necrosis present in the posterior knee.

## 2016-02-19 NOTE — Progress Notes (Signed)
Pharmacy Antibiotic Note  Kaitlin Henderson is a 75 y.o. female admitted on 02/19/2016 with sepsis.  Pharmacy has been consulted for Zosyn and vancomycin dosing.  Plan: 1. Zosyn 3.375 gm IV Q8H EI 2. Vancomycin 1 gm IV x 1 in ED followed in 8 hours (stacked dosing) by vancomycin 750 mg IV Q12H, predicted trough 18 mcg/mL. Pharmacy will continue to follow and adjust as needed to maintain trough 15 to 20 mcg/mL.   Vd 47.4 L, Ke 0.055 hr-1, T1/2 12.7 hr  Height: 5' 2.5" (158.8 cm) Weight: 205 lb 4.8 oz (93.123 kg) IBW/kg (Calculated) : 51.25  Temp (24hrs), Avg:100.3 F (37.9 C), Min:100.3 F (37.9 C), Max:100.3 F (37.9 C)   Recent Labs Lab 02/19/16 1949 02/19/16 1951  WBC 12.5*  --   CREATININE 0.86  --   LATICACIDVEN  --  2.0    Estimated Creatinine Clearance: 60.7 mL/min (by C-G formula based on Cr of 0.86).    Allergies  Allergen Reactions  . Codeine Other (See Comments)    Pt states that this medication makes her pass out.   . Levofloxacin Other (See Comments)    Reaction:  Redness   . Sulfa Antibiotics Swelling    Thank you for allowing pharmacy to be a part of this patient's care.  Laural Benes, Pharm.D., BCPS Clinical Pharmacist 02/19/2016 10:13 PM

## 2016-02-19 NOTE — ED Provider Notes (Signed)
[facility] Emergency Department Provider Note   ____________________________________________  Time seen: ~1940  I have reviewed the triage vital signs and the nursing notes.   HISTORY  Chief Complaint Weakness   History limited by: Not Limited   HPI Kaitlin Henderson is a 75 y.o. female who presents to the emergency department today from [facility] via EMS. Patient was found lying in her bed. She states she had been lying in her bed for the past 5 days. When asked why she says because it felt more comfortable lying in her bed and getting up. She does complain of pain primarily on the right side where her skin is starting to breakdown. She denies any further abdominal pain. She was seen in the emergency department roughly 3 weeks ago for abdominal pain and diarrhea. She says she has continued to have diarrhea. She has not appreciated any fevers. He denies any dysuria. No chest pain or shortness breath.   Past Medical History  Diagnosis Date  . Breast cancer, right breast ([place]) 1998    s/p chemo & XRT; right mastectomy  . Obesity   . Coronary artery disease   . Aphthous stomatitis   . Depression   . Diabetes mellitus, type 2 ([place]) 03/2010  . Hyperlipemia   . COPD (chronic obstructive pulmonary disease) ([place])   . NSTEMI (non-ST elevated myocardial infarction) ([place]) 01/2012  . Breast cancer, right breast ([place]) 1998    s/p chemo & XRT, right mastectomy  . OBESITY   . CORONARY ARTERY DISEASE   . APHTHOUS STOMATITIS   . DEPRESSION     started sertraline 09/2010  . DIABETES, TYPE 2 dx 03/2010  . HYPERLIPIDEMIA   . Hypothyroidism   . C O P D     chronic O2 3LPM [place]  . Essential tremor   . NSTEMI (non-ST elevated myocardial infarction) ([place]) 01/2012    med mgmt  . Anginal pain ([place])   . GERD (gastroesophageal reflux disease)   . CHF (congestive heart failure) [facility])     Patient Active Problem List   Diagnosis Date Noted  . Cellulitis  of leg, left 12/12/2015  . Cellulitis of right lower extremity 11/26/2015  . Lung nodule 11/25/2015  . Anxiety 11/25/2015  . GERD (gastroesophageal reflux disease) 11/25/2015  . Cellulitis of left lower extremity 11/25/2015  . Leukocytosis 11/16/2015  . Cellulitis 11/16/2015  . 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 COPD exacerbation ([place])   . Acute on chronic respiratory failure with hypoxia ([place])   . SOB (shortness of breath) 11/02/2015  . Non-compliant behavior 12/05/2014  . Decreased mobility 08/15/2014  . Benign essential tremor 09/05/2011  . Breast cancer, right breast ([place])   . DEPRESSION 09/25/2010  . HAIR LOSS 06/26/2010  . Type 2 diabetes mellitus with neurological complications ([place]) [id number]  . POLYURIA 03/13/2010  . APHTHOUS STOMATITIS 08/23/2009  . CHRONIC RESPIRATORY FAILURE 08/18/2007  . Hyperlipidemia 08/17/2007  . OBESITY 08/17/2007  . CORONARY ARTERY DISEASE 08/17/2007  . COPD with emphysema, Gold D  08/17/2007    Past Surgical History  Procedure Laterality Date  . Right mastectomy    . Appendectomy    . Cholecystectomy    . Abdominal hysterectomy    . Right mastectomy    . Tonsillectomy    . Mastectomy      Current Outpatient Rx  Name  Route  Sig  Dispense  Refill  . albuterol (PROVENTIL) (2.5 MG/3ML) 0.083% nebulizer solution  Nebulization   Take 3 mLs (2.5 mg total) by nebulization every 6 (six) hours as needed for wheezing or shortness of breath.   75 mL   0   . albuterol (PROVENTIL) (2.5 MG/3ML) 0.083% nebulizer solution   Nebulization   Take 2.5 mg by nebulization every 6 (six) hours as needed for wheezing or shortness of breath.         . ALPRAZolam (XANAX) 0.25 MG tablet   Oral   Take 0.25 mg by mouth 3 (three) times daily as needed for anxiety.         . ALPRAZolam (XANAX) 0.25 MG tablet   Oral   Take 0.5 tablets (0.125 mg total) by mouth 3 (three) times daily as needed for anxiety.   30 tablet   0   . aspirin  325 MG tablet   Oral   Take 650 mg by mouth at bedtime.         [name] atorvastatin (LIPITOR) 40 MG tablet   Oral   Take 40 mg by mouth daily.         . budesonide (PULMICORT) 0.5 MG/2ML nebulizer solution   Nebulization   Take 0.5 mg by nebulization 2 (two) times daily.         . cephALEXin (KEFLEX) 500 MG capsule   Oral   Take 1 capsule (500 mg total) by mouth 4 (four) times daily.   30 capsule   0   . clindamycin (CLEOCIN) 300 MG capsule   Oral   Take 1 capsule (300 mg total) by mouth 3 (three) times daily.   30 capsule   0   . clotrimazole (LOTRIMIN) 1 % cream   Topical   Apply 1 application topically 2 (two) times daily.   30 g   0   . doxycycline (VIBRA-TABS) 100 MG tablet   Oral   Take 100 mg by mouth 2 (two) times daily.         [name] esomeprazole (NEXIUM) 40 MG capsule   Oral   Take 40 mg by mouth daily at 12 noon.         . insulin aspart (NOVOLOG) 100 UNIT/ML injection   Subcutaneous   Inject 7 Units into the skin 3 (three) times daily with meals.         . insulin detemir (LEVEMIR) 100 UNIT/ML injection   Subcutaneous   Inject 15 Units into the skin at bedtime.         . polyethylene glycol (MIRALAX / GLYCOLAX) packet   Oral   Take 17 g by mouth daily as needed.         . predniSONE (DELTASONE) 5 MG tablet      Take 6 tabs PO day 21; 5 tabs PO day 2; 4 tabs PO day 3; 3 tabs PO day 4; 2 tabs PO day 5; 1 tab PO day 6 & 7.         . Probiotic Product (ALIGN) 4 MG CAPS   Oral   Take 1 capsule (4 mg total) by mouth daily.   30 capsule   0   . tiotropium (SPIRIVA) 18 MCG inhalation capsule   Inhalation   Place 1 capsule (18 mcg total) into inhaler and inhale daily.   30 capsule   0   . tiotropium (SPIRIVA) 18 MCG inhalation capsule   Inhalation   Place 18 mcg into inhaler and inhale daily.           Allergies Codeine;  Codeine; Levaquin; Levaquin; Sulfa antibiotics; and Sulfonamide derivatives  Family History  Problem  Relation Age of Onset  . Diabetes Neg Hx   . Cancer Neg Hx     Social History Social History  Substance Use Topics  . Smoking status: Former Smoker -- 2.50 packs/day for 54 years    Types: Cigarettes    Quit date: 09/02/1997  . Smokeless tobacco: Never Used     Comment: Retired. Widow/widower since 2008-lives alone. Home hospice since 06/2009 related to COPD  . Alcohol Use: No    Review of Systems  Constitutional: Negative for fever. Cardiovascular: Negative for chest pain. Respiratory: Negative for shortness of breath. Gastrointestinal: Negative for abdominal pain. Positive for diarrhea Genitourinary: Negative for dysuria. Skin: As if her skin pain and rash Neurological: Negative for headaches, focal weakness or numbness.   10-point ROS otherwise negative.  ____________________________________________   PHYSICAL EXAM:  VITAL SIGNS:     --   108   23  --  100 %    Constitutional: Awake and alert. Slightly uncomfortable appearing.  Eyes: Conjunctivae are normal. PERRL. Normal extraocular movements. ENT   Head: Normocephalic and atraumatic.   Nose: No congestion/rhinnorhea.   Mouth/Throat: Mucous membranes are moist.   Neck: No stridor. Hematological/Lymphatic/Immunilogical: No cervical lymphadenopathy. Cardiovascular: tachycardic, regular rhythm.  No murmurs, rubs, or gallops. Respiratory:tachypniec. Breath sounds are clear and equal bilaterally. No wheezes/rales/rhonchi. Gastrointestinal: Soft and nontender. No distention.  Genitourinary: Deferred Musculoskeletal: Normal range of motion in all extremities. No joint effusions.  No lower extremity tenderness nor edema. Neurologic:  Normal speech and language. No gross focal neurologic deficits are appreciated.  Skin:  Large amount of foul smelling skin on lateral right leg and popliteal fossa with some breakdown. Also area of breakdown on stomach with foul odor. Erythema surrounding both sites.    Psychiatric: Mood and affect are normal. Speech and behavior are normal. Patient exhibits appropriate insight and judgment.  ____________________________________________    LABS (pertinent positives/negatives)  Pending  ____________________________________________   EKG  None  ____________________________________________    RADIOLOGY  CXR IMPRESSION: 1. Increased interstitial accentuation compared to 12/12/2015, potentially a manifestation of atypical infection or pulmonary venous hypertension, although no cardiomegaly is evident. 2. Emphysema. 3. Right apical density is not appreciably changed. This had a worrisome appearance for carcinoma on chest CT, although asymmetric density has been present at the right lung apex for at least 9 years. 4. Atherosclerotic aortic arch.   ____________________________________________   PROCEDURES  Procedure(s) performed: None  Critical Care performed: Yes, see critical care note(s)   CRITICAL CARE Performed by: Nance Pear   Total critical care time: 25 minutes  Critical care time was exclusive of separately billable procedures and treating other patients.  Critical care was necessary to treat or prevent imminent or life-threatening deterioration.  Critical care was time spent personally by me on the following activities: development of treatment plan with patient and/or surrogate as well as nursing, discussions with consultants, evaluation of patient's response to treatment, examination of patient, obtaining history from patient or surrogate, ordering and performing treatments and interventions, ordering and review of laboratory studies, ordering and review of radiographic studies, pulse oximetry and re-evaluation of patient's condition. ____________________________________________   INITIAL IMPRESSION / ASSESSMENT AND PLAN / ED COURSE  Pertinent labs & imaging results that were available during my care of the  patient were reviewed by me and considered in my medical decision making (see chart for details).  She presented to the emergency department  today after being found on her mattress. She states she has been there for roughly 5 days. On exam patient does have fairly extensive skin breakdown with foul odor concerning for cellulitis. She was both tachycardic and tachypnea concerning for sepsis. Patient was called a code sepsis. She was ordered broad-spectrum antibiotics and IV fluids. Lab work was pending at time of sign out. Anticipate admission.  ____________________________________________   FINAL CLINICAL IMPRESSION(S) / ED DIAGNOSES  Sepsis Skin ulcers  Note: This dictation was prepared with Dragon dictation. Any transcriptional errors that result from this process are unintentional    Nance Pear, MD 02/19/16 2037

## 2016-02-19 NOTE — ED Provider Notes (Addendum)
-----------------------------------------   8:44 PM on 02/19/2016 -----------------------------------------   Blood pressure 108/56, pulse 109, resp. rate 24, height 5' 2.5" (1.588 m), weight 93.123 kg, SpO2 97 %.  Assuming care from Dr. Archie Balboa.  In short, Kaitlin Henderson is a 75 y.o. female with a chief complaint of Weakness .  Refer to the original H&P for additional details.  The current plan of care is to follow up results and admit.  ----------------------------------------- 11:15 PM on 02/19/2016 -----------------------------------------  Labs surprisingly unremarkable, but the vitals remain abnormal with tachycardia, tachypnea, low grade fever of 100.3.  Cellulitis and skin breakdown on leg.  Received empiric abx.  Discussed in person with hospitalist for admission.  Hinda Kehr, MD 02/19/16 2318

## 2016-02-20 ENCOUNTER — Inpatient Hospital Stay: Payer: Medicare Other

## 2016-02-20 DIAGNOSIS — Z9011 Acquired absence of right breast and nipple: Secondary | ICD-10-CM | POA: Diagnosis not present

## 2016-02-20 DIAGNOSIS — E11628 Type 2 diabetes mellitus with other skin complications: Secondary | ICD-10-CM | POA: Diagnosis present

## 2016-02-20 DIAGNOSIS — K219 Gastro-esophageal reflux disease without esophagitis: Secondary | ICD-10-CM | POA: Diagnosis present

## 2016-02-20 DIAGNOSIS — G250 Essential tremor: Secondary | ICD-10-CM | POA: Diagnosis present

## 2016-02-20 DIAGNOSIS — Z7984 Long term (current) use of oral hypoglycemic drugs: Secondary | ICD-10-CM | POA: Diagnosis not present

## 2016-02-20 DIAGNOSIS — Z9049 Acquired absence of other specified parts of digestive tract: Secondary | ICD-10-CM | POA: Diagnosis not present

## 2016-02-20 DIAGNOSIS — Z885 Allergy status to narcotic agent status: Secondary | ICD-10-CM | POA: Diagnosis not present

## 2016-02-20 DIAGNOSIS — L8990 Pressure ulcer of unspecified site, unspecified stage: Secondary | ICD-10-CM | POA: Insufficient documentation

## 2016-02-20 DIAGNOSIS — Z882 Allergy status to sulfonamides status: Secondary | ICD-10-CM | POA: Diagnosis not present

## 2016-02-20 DIAGNOSIS — Z853 Personal history of malignant neoplasm of breast: Secondary | ICD-10-CM | POA: Diagnosis not present

## 2016-02-20 DIAGNOSIS — L03116 Cellulitis of left lower limb: Secondary | ICD-10-CM | POA: Diagnosis present

## 2016-02-20 DIAGNOSIS — J449 Chronic obstructive pulmonary disease, unspecified: Secondary | ICD-10-CM | POA: Diagnosis present

## 2016-02-20 DIAGNOSIS — E785 Hyperlipidemia, unspecified: Secondary | ICD-10-CM | POA: Diagnosis present

## 2016-02-20 DIAGNOSIS — I110 Hypertensive heart disease with heart failure: Secondary | ICD-10-CM | POA: Diagnosis present

## 2016-02-20 DIAGNOSIS — Z794 Long term (current) use of insulin: Secondary | ICD-10-CM | POA: Diagnosis not present

## 2016-02-20 DIAGNOSIS — Z79899 Other long term (current) drug therapy: Secondary | ICD-10-CM | POA: Diagnosis not present

## 2016-02-20 DIAGNOSIS — E669 Obesity, unspecified: Secondary | ICD-10-CM | POA: Diagnosis present

## 2016-02-20 DIAGNOSIS — F419 Anxiety disorder, unspecified: Secondary | ICD-10-CM | POA: Diagnosis present

## 2016-02-20 DIAGNOSIS — Z9221 Personal history of antineoplastic chemotherapy: Secondary | ICD-10-CM | POA: Diagnosis not present

## 2016-02-20 DIAGNOSIS — I2510 Atherosclerotic heart disease of native coronary artery without angina pectoris: Secondary | ICD-10-CM | POA: Diagnosis present

## 2016-02-20 DIAGNOSIS — J9610 Chronic respiratory failure, unspecified whether with hypoxia or hypercapnia: Secondary | ICD-10-CM | POA: Diagnosis present

## 2016-02-20 DIAGNOSIS — Z66 Do not resuscitate: Secondary | ICD-10-CM | POA: Diagnosis present

## 2016-02-20 DIAGNOSIS — A419 Sepsis, unspecified organism: Secondary | ICD-10-CM | POA: Diagnosis present

## 2016-02-20 DIAGNOSIS — L03115 Cellulitis of right lower limb: Secondary | ICD-10-CM | POA: Diagnosis present

## 2016-02-20 DIAGNOSIS — Z87891 Personal history of nicotine dependence: Secondary | ICD-10-CM | POA: Diagnosis not present

## 2016-02-20 DIAGNOSIS — I509 Heart failure, unspecified: Secondary | ICD-10-CM | POA: Diagnosis present

## 2016-02-20 DIAGNOSIS — Z7982 Long term (current) use of aspirin: Secondary | ICD-10-CM | POA: Diagnosis not present

## 2016-02-20 DIAGNOSIS — Z881 Allergy status to other antibiotic agents status: Secondary | ICD-10-CM | POA: Diagnosis not present

## 2016-02-20 DIAGNOSIS — F329 Major depressive disorder, single episode, unspecified: Secondary | ICD-10-CM | POA: Diagnosis present

## 2016-02-20 DIAGNOSIS — E039 Hypothyroidism, unspecified: Secondary | ICD-10-CM | POA: Diagnosis present

## 2016-02-20 DIAGNOSIS — Z9981 Dependence on supplemental oxygen: Secondary | ICD-10-CM | POA: Diagnosis not present

## 2016-02-20 DIAGNOSIS — E119 Type 2 diabetes mellitus without complications: Secondary | ICD-10-CM | POA: Diagnosis present

## 2016-02-20 DIAGNOSIS — Z993 Dependence on wheelchair: Secondary | ICD-10-CM | POA: Diagnosis not present

## 2016-02-20 DIAGNOSIS — I252 Old myocardial infarction: Secondary | ICD-10-CM | POA: Diagnosis not present

## 2016-02-20 DIAGNOSIS — Z7951 Long term (current) use of inhaled steroids: Secondary | ICD-10-CM | POA: Diagnosis not present

## 2016-02-20 DIAGNOSIS — E876 Hypokalemia: Secondary | ICD-10-CM | POA: Diagnosis present

## 2016-02-20 LAB — BASIC METABOLIC PANEL
ANION GAP: 4 — AB (ref 5–15)
BUN: 13 mg/dL (ref 6–20)
CALCIUM: 7.9 mg/dL — AB (ref 8.9–10.3)
CO2: 31 mmol/L (ref 22–32)
CREATININE: 0.83 mg/dL (ref 0.44–1.00)
Chloride: 108 mmol/L (ref 101–111)
GFR calc Af Amer: >60 mL/min (ref >60)
GFR calc non Af Amer: >60 mL/min (ref >60)
GLUCOSE: 148 mg/dL — AB (ref 65–99)
Potassium: 3.2 mmol/L — ABNORMAL LOW (ref 3.5–5.1)
Sodium: 143 mmol/L (ref 135–145)

## 2016-02-20 LAB — CBC
HCT: 33.7 % — ABNORMAL LOW (ref 35.0–47.0)
HEMOGLOBIN: 11.6 g/dL — AB (ref 12.0–16.0)
MCH: 32.5 pg (ref 26.0–34.0)
MCHC: 34.3 g/dL (ref 32.0–36.0)
MCV: 94.7 fL (ref 80.0–100.0)
Platelets: 239 10*3/uL (ref 150–440)
RBC: 3.55 MIL/uL — ABNORMAL LOW (ref 3.80–5.20)
RDW: 13.3 % (ref 11.5–14.5)
WBC: 7.3 10*3/uL (ref 3.6–11.0)

## 2016-02-20 LAB — GLUCOSE, CAPILLARY
GLUCOSE-CAPILLARY: 118 mg/dL — AB (ref 65–99)
Glucose-Capillary: 80 mg/dL (ref 65–99)
Glucose-Capillary: 123 mg/dL — ABNORMAL HIGH (ref 65–99)
Glucose-Capillary: 137 mg/dL — ABNORMAL HIGH (ref 65–99)
Glucose-Capillary: 99 mg/dL (ref 65–99)

## 2016-02-20 LAB — C-REACTIVE PROTEIN: CRP: 9.8 mg/dL — AB (ref <1.0)

## 2016-02-20 LAB — MRSA PCR SCREENING: MRSA BY PCR: NEGATIVE

## 2016-02-20 MED ORDER — ONDANSETRON HCL 4 MG/2ML IJ SOLN
4.0000 mg | Freq: Four times a day (QID) | INTRAMUSCULAR | Status: DC | PRN
Start: 2016-02-20 — End: 2016-02-23

## 2016-02-20 MED ORDER — VANCOMYCIN HCL IN DEXTROSE 1-5 GM/200ML-% IV SOLN: 1000.0000 mg | Freq: Once | INTRAVENOUS | Status: DC

## 2016-02-20 MED ORDER — ACETAMINOPHEN 325 MG PO TABS
650.0000 mg | ORAL_TABLET | Freq: Four times a day (QID) | ORAL | Status: DC | PRN
  Administered 2016-02-20 – 2016-02-21 (×2): 650 mg via ORAL
  Filled 2016-02-20 (×2): qty 2

## 2016-02-20 MED ORDER — SODIUM CHLORIDE 0.9 % IV SOLN
INTRAVENOUS | Status: DC
  Administered 2016-02-20 (×3): via INTRAVENOUS

## 2016-02-20 MED ORDER — ACETAMINOPHEN 650 MG RE SUPP: 650.0000 mg | Freq: Four times a day (QID) | RECTAL | Status: DC | PRN

## 2016-02-20 MED ORDER — PIPERACILLIN-TAZOBACTAM 3.375 G IVPB
3.3750 g | Freq: Three times a day (TID) | INTRAVENOUS | Status: DC
  Administered 2016-02-20 – 2016-02-21 (×5): 3.375 g via INTRAVENOUS
  Filled 2016-02-20 (×6): qty 50

## 2016-02-20 MED ORDER — RISAQUAD PO CAPS
1.0000 | ORAL_CAPSULE | Freq: Every day | ORAL | Status: DC
  Administered 2016-02-20 – 2016-02-23 (×4): 1 via ORAL
  Filled 2016-02-20 (×4): qty 1

## 2016-02-20 MED ORDER — SODIUM CHLORIDE 0.9% FLUSH
3.0000 mL | Freq: Two times a day (BID) | INTRAVENOUS | Status: DC
  Administered 2016-02-20 – 2016-02-22 (×6): 3 mL via INTRAVENOUS

## 2016-02-20 MED ORDER — ALBUTEROL SULFATE (2.5 MG/3ML) 0.083% IN NEBU
2.5000 mg | INHALATION_SOLUTION | Freq: Four times a day (QID) | RESPIRATORY_TRACT | Status: DC | PRN
  Administered 2016-02-20: 08:00:00 2.5 mg via RESPIRATORY_TRACT
  Filled 2016-02-20: qty 3

## 2016-02-20 MED ORDER — ATORVASTATIN CALCIUM 20 MG PO TABS
40.0000 mg | ORAL_TABLET | Freq: Every day | ORAL | Status: DC
  Administered 2016-02-20 – 2016-02-22 (×4): 40 mg via ORAL
  Filled 2016-02-20 (×4): qty 2

## 2016-02-20 MED ORDER — VANCOMYCIN HCL IN DEXTROSE 1-5 GM/200ML-% IV SOLN
1000.0000 mg | INTRAVENOUS | Status: DC
  Administered 2016-02-20: 22:00:00 1000 mg via INTRAVENOUS
  Filled 2016-02-20 (×2): qty 200

## 2016-02-20 MED ORDER — INSULIN ASPART 100 UNIT/ML ~~LOC~~ SOLN
0.0000 [IU] | Freq: Three times a day (TID) | SUBCUTANEOUS | Status: DC
  Administered 2016-02-20 – 2016-02-22 (×4): 1 [IU] via SUBCUTANEOUS
  Administered 2016-02-23: 12:00:00 2 [IU] via SUBCUTANEOUS
  Filled 2016-02-20: qty 1
  Filled 2016-02-20: qty 2
  Filled 2016-02-20 (×3): qty 1

## 2016-02-20 MED ORDER — VANCOMYCIN HCL IN DEXTROSE 750-5 MG/150ML-% IV SOLN
750.0000 mg | Freq: Two times a day (BID) | INTRAVENOUS | Status: DC
  Administered 2016-02-20: 04:00:00 750 mg via INTRAVENOUS
  Filled 2016-02-20 (×2): qty 150

## 2016-02-20 MED ORDER — ENOXAPARIN SODIUM 40 MG/0.4ML ~~LOC~~ SOLN
40.0000 mg | Freq: Every day | SUBCUTANEOUS | Status: DC
  Administered 2016-02-20 – 2016-02-22 (×4): 40 mg via SUBCUTANEOUS
  Filled 2016-02-20 (×4): qty 0.4

## 2016-02-20 MED ORDER — PANTOPRAZOLE SODIUM 40 MG PO TBEC
80.0000 mg | DELAYED_RELEASE_TABLET | Freq: Every day | ORAL | Status: DC
  Administered 2016-02-20 – 2016-02-21 (×2): 80 mg via ORAL
  Filled 2016-02-20 (×3): qty 2

## 2016-02-20 MED ORDER — INSULIN ASPART 100 UNIT/ML ~~LOC~~ SOLN: 0.0000 [IU] | Freq: Every day | SUBCUTANEOUS | Status: DC

## 2016-02-20 MED ORDER — PIPERACILLIN-TAZOBACTAM 3.375 G IVPB 30 MIN: 3.3750 g | Freq: Once | INTRAVENOUS | Status: DC

## 2016-02-20 MED ORDER — INSULIN DETEMIR 100 UNIT/ML ~~LOC~~ SOLN
15.0000 [IU] | Freq: Every day | SUBCUTANEOUS | Status: DC
  Administered 2016-02-20 – 2016-02-22 (×4): 15 [IU] via SUBCUTANEOUS
  Filled 2016-02-20 (×5): qty 0.15

## 2016-02-20 MED ORDER — ASPIRIN 325 MG PO TABS
325.0000 mg | ORAL_TABLET | Freq: Every day | ORAL | Status: DC
  Administered 2016-02-20 – 2016-02-22 (×4): 325 mg via ORAL
  Filled 2016-02-20 (×4): qty 1

## 2016-02-20 MED ORDER — ONDANSETRON HCL 4 MG PO TABS: 4.0000 mg | ORAL_TABLET | Freq: Four times a day (QID) | ORAL | Status: DC | PRN

## 2016-02-20 MED ORDER — CLOTRIMAZOLE 1 % EX CREA
1.0000 "application " | TOPICAL_CREAM | Freq: Two times a day (BID) | CUTANEOUS | Status: DC
  Administered 2016-02-20 – 2016-02-23 (×8): 1 via TOPICAL
  Filled 2016-02-20 (×2): qty 15

## 2016-02-20 MED ORDER — ALPRAZOLAM 0.25 MG PO TABS
0.1250 mg | ORAL_TABLET | Freq: Three times a day (TID) | ORAL | Status: DC | PRN
  Administered 2016-02-20: 0.125 mg via ORAL
  Filled 2016-02-20: qty 1

## 2016-02-20 MED ORDER — POTASSIUM CHLORIDE CRYS ER 20 MEQ PO TBCR
40.0000 meq | EXTENDED_RELEASE_TABLET | Freq: Once | ORAL | Status: AC
  Administered 2016-02-20: 15:00:00 40 meq via ORAL
  Filled 2016-02-20: qty 2

## 2016-02-20 MED ORDER — POLYETHYLENE GLYCOL 3350 17 G PO PACK: 17.0000 g | PACK | Freq: Every day | ORAL | Status: DC | PRN

## 2016-02-20 MED ORDER — TIOTROPIUM BROMIDE MONOHYDRATE 18 MCG IN CAPS
18.0000 ug | ORAL_CAPSULE | Freq: Every day | RESPIRATORY_TRACT | Status: DC
  Administered 2016-02-20 – 2016-02-23 (×4): 18 ug via RESPIRATORY_TRACT
  Filled 2016-02-20: qty 5

## 2016-02-20 MED ORDER — BUDESONIDE 0.5 MG/2ML IN SUSP
0.5000 mg | Freq: Two times a day (BID) | RESPIRATORY_TRACT | Status: DC
  Administered 2016-02-20 – 2016-02-23 (×7): 0.5 mg via RESPIRATORY_TRACT
  Filled 2016-02-20 (×7): qty 2

## 2016-02-20 NOTE — Plan of Care (Signed)
Problem: Skin Integrity: Goal: Risk for impaired skin integrity will decrease Outcome: Progressing Skin care provided.  WC consult pending.

## 2016-02-20 NOTE — Care Management (Signed)
Admitted to [facility] with the diagnosis of cellulitis. Friend is Dickie La [phone]). Primary care physician is Dr. Otelia Limes at [facility]. Chronic home oxygen 5 liters per nasal cannula provided by [facility]. Couldn't tell me how long she has been on home oxygen. Gets prescriptions filled at [facility]. Uses a motorized wheelchair to aid in getting around. A patient at [facility] in [place] x 20 days 12/14/15. Followed by [facility] in the past, but "I told them not to come back." [place] in the past. Sleeps on a day bed at [facility], Eats 3 meals a day that is offered at [facility]. Several falls in the past. " I want to go to a skilled facility." Shelbie Ammons RN MSN [place] Management [phone]

## 2016-02-20 NOTE — Progress Notes (Signed)
[place] at [place] NAME: Kaitlin Henderson    MR#:  [id number]  DATE OF BIRTH:  [birth year]  SUBJECTIVE:  CHIEF COMPLAINT:   Chief Complaint  Patient presents with  . Weakness   - Admitted from [facility] bridge with worsening lower extremity edema and erythema - on IV ABX for cellulitis- multiple complaints - Does not want to go back to the same facility  REVIEW OF SYSTEMS:  Review of Systems  Constitutional: Positive for malaise/fatigue. Negative for fever and chills.  HENT: Negative for congestion and nosebleeds.   Eyes: Negative for blurred vision and double vision.  Respiratory: Negative for cough, shortness of breath, wheezing and stridor.   Cardiovascular: Positive for leg swelling. Negative for chest pain and palpitations.  Gastrointestinal: Negative for nausea, vomiting, abdominal pain, diarrhea and constipation.  Genitourinary: Negative for dysuria.  Skin: Positive for rash.  Neurological: Negative for dizziness, sensory change, speech change, focal weakness, seizures and headaches.  Psychiatric/Behavioral: Negative for depression.    DRUG ALLERGIES:   Allergies  Allergen Reactions  . Codeine Other (See Comments)    Pt states that this medication makes her pass out.   . Levofloxacin Other (See Comments)    Reaction:  Redness   . Sulfa Antibiotics Swelling    VITALS:  Blood pressure 110/50, pulse 98, temperature 98.7 F (37.1 C), temperature source Oral, resp. rate 20, height 5\' 2"  (1.575 m), weight 89.495 kg (197 lb 4.8 oz), SpO2 96 %.  PHYSICAL EXAMINATION:  Physical Exam  GENERAL:  75 y.o.-year-old patient lying in the bed with no acute distress. Disheleved appearing EYES: Pupils equal, round, reactive to light and accommodation. No scleral icterus. Extraocular muscles intact.  HEENT: Head atraumatic, normocephalic. Oropharynx and nasopharynx clear.  NECK:  Supple, no jugular venous distention. No thyroid  enlargement, no tenderness.  LUNGS: Normal breath sounds bilaterally, no wheezing, rales,rhonchi or crepitation. No use of accessory muscles of respiration. Decreased bibasilar breath sounds CARDIOVASCULAR: S1, S2 normal. No murmurs, rubs, or gallops.  ABDOMEN: Soft, obese, nontender, nondistended. Bowel sounds present. No organomegaly or mass.  EXTREMITIES: No  cyanosis, or clubbing. 2+ pedal edema present, significant erythema of Left lower extremity NEUROLOGIC: Cranial nerves II through XII are intact. Muscle strength 3/5 in all extremities. Global weakness noted. Sensation intact. Gait not checked.  PSYCHIATRIC: The patient is alert and oriented x 3.  SKIN: Erythema of both lower extremities worse on the left side. Skin breakdown occasionally and pressure unstageable ulcers on the back of thigh   LABORATORY PANEL:   CBC  Recent Labs Lab 02/20/16 1112  WBC 7.3  HGB 11.6*  HCT 33.7*  PLT 239   ------------------------------------------------------------------------------------------------------------------  Chemistries   Recent Labs Lab 02/19/16 1949 02/20/16 1112  NA 138 143  K 4.0 3.2*  CL 93* 108  CO2 34* 31  GLUCOSE 151* 148*  BUN 15 13  CREATININE 0.86 0.83  CALCIUM 9.1 7.9*  AST 21  --   ALT 12*  --   ALKPHOS 71  --   BILITOT 0.8  --    ------------------------------------------------------------------------------------------------------------------  Cardiac Enzymes  Recent Labs Lab 02/19/16 1949  TROPONINI <0.03   ------------------------------------------------------------------------------------------------------------------  RADIOLOGY:  US Venous Img Lower Unilateral Right  02/20/2016  CLINICAL DATA:  Right lower extremity swelling. EXAM: Right LOWER EXTREMITY VENOUS DOPPLER ULTRASOUND TECHNIQUE: Gray-scale sonography with graded compression, as well as color Doppler and duplex ultrasound were performed to evaluate the lower extremity deep venous  systems  from the level of the common femoral vein and including the common femoral, femoral, profunda femoral, popliteal and calf veins including the posterior tibial, peroneal and gastrocnemius veins when visible. The superficial great saphenous vein was also interrogated. Spectral Doppler was utilized to evaluate flow at rest and with distal augmentation maneuvers in the common femoral, femoral and popliteal veins. COMPARISON:  None. FINDINGS: Contralateral Common Femoral Vein: Respiratory phasicity is normal and symmetric with the symptomatic side. No evidence of thrombus. Normal compressibility. Common Femoral Vein: No evidence of thrombus. Normal compressibility, respiratory phasicity and response to augmentation. Saphenofemoral Junction: No evidence of thrombus. Normal compressibility and flow on color Doppler imaging. Profunda Femoral Vein: No evidence of thrombus. Normal compressibility and flow on color Doppler imaging. Femoral Vein: No evidence of thrombus. Normal compressibility, respiratory phasicity and response to augmentation. Popliteal Vein: No evidence of thrombus. Normal compressibility, respiratory phasicity and response to augmentation. Calf Veins: No evidence of thrombus. Normal compressibility and flow on color Doppler imaging. Superficial Great Saphenous Vein: No evidence of thrombus. Normal compressibility and flow on color Doppler imaging. Venous Reflux:  None. Other Findings:  None. IMPRESSION: There is no evidence of deep venous thrombosis seen in right lower extremity. Electronically Signed   By: Marijo Conception, M.D.   On: 02/20/2016 09:49   Dg Chest Port 1 View  02/19/2016  CLINICAL DATA:  Right-sided skin breakdown, patient found lying on floor an independent living facility, potentially 4 days. Home oxygen. COPD. EXAM: PORTABLE CHEST 1 VIEW COMPARISON:  12/12/2015 FINDINGS: Right axillary clips. Continued mild asymmetric density at the right lung apex. Atherosclerotic aortic arch.  Left rib deformities similar to prior, likely from healed fractures. No pneumothorax. Heart size within normal limits for the AP projection. Mild interstitial accentuation in both lungs, similar to prior. No pneumonia is identified. Emphysema. IMPRESSION: 1. Increased interstitial accentuation compared to 12/12/2015, potentially a manifestation of atypical infection or pulmonary venous hypertension, although no cardiomegaly is evident. 2. Emphysema. 3. Right apical density is not appreciably changed. This had a worrisome appearance for carcinoma on chest CT, although asymmetric density has been present at the right lung apex for at least 9 years. 4. Atherosclerotic aortic arch. Electronically Signed   By: Van Clines M.D.   On: 02/19/2016 20:19    EKG:   Orders placed or performed during the hospital encounter of 01/31/16  . EKG 12-Lead  . EKG 12-Lead    ASSESSMENT AND PLAN:   75 year old female with past medical history of COPD on home oxygen, hypertension, hyperlipidemia and diabetes mellitus from assisted living facility who is wheelchair bound at baseline presents to the hospital secondary to left lower extremity cellulitis.  #1 left lower extremity cellulitis-patient seems like she has chronic lower extremity edema from poor venous drainage. -Blood cultures are pending -Lower extremity ultrasound is negative for any DVT -Continue vancomycin and Zosyn now. Keep the leg elevated. -Appreciate wound nurse consult.  #2 hypokalemia-replaced  #3 COPD-continue oxygen support. Nebs. Also on Pulmicort nebulizer.  #4 diabetes mellitus-on Levemir and sliding scale insulin  #5 GERD-on Protonix  #6 DVT prophylaxis-on Lovenox.  Physical therapy consult. Patient might need higher level of care   All the records are reviewed and case discussed with Care Management/Social Workerr. Management plans discussed with the patient, family and they are in agreement.  CODE STATUS: Full  Code  TOTAL TIME TAKING CARE OF THIS PATIENT: 37 minutes.   POSSIBLE D/C IN 2 DAYS, DEPENDING ON CLINICAL CONDITION.   Gladstone Lighter  M.D on 02/20/2016 at 3:58 PM  Between 7am to 6pm - Pager - [phone]  After 6pm go to [email or web address] - password [email or web address] [facility]  Office  [phone]  CC: Primary care physician; Gwendolyn Grant, MD

## 2016-02-20 NOTE — NC FL2 (Signed)
[place] LEVEL OF CARE SCREENING TOOL     IDENTIFICATION  Patient Name: Kaitlin Henderson Birthdate: 1941-05-13 Sex: female Admission Date (Current Location): 02/19/2016  [place] and [place] Number:  Engineering geologist and Address:  [facility], [address], [place], [place] [address]      Provider Number: [id number]  Attending Physician Name and Address:  Gladstone Lighter, MD  Relative Name and Phone Number:       Current Level of Care: Hospital Recommended Level of Care: [place] Prior Approval Number:    Date Approved/Denied:   PASRR Number: [id number]  Discharge Plan: SNF    Current Diagnoses: Patient Active Problem List   Diagnosis Date Noted  . Pressure ulcer 02/20/2016  . Cellulitis of leg, left 12/12/2015  . Cellulitis of right lower extremity 11/26/2015  . Lung nodule 11/25/2015  . Anxiety 11/25/2015  . GERD (gastroesophageal reflux disease) 11/25/2015  . Cellulitis of left lower extremity 11/25/2015  . Leukocytosis 11/16/2015  . Cellulitis 11/16/2015  . 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 COPD exacerbation ([place])   . Acute on chronic respiratory failure with hypoxia ([place])   . SOB (shortness of breath) 11/02/2015  . Non-compliant behavior 12/05/2014  . Decreased mobility 08/15/2014  . Benign essential tremor 09/05/2011  . Breast cancer, right breast ([place])   . DEPRESSION 09/25/2010  . HAIR LOSS 06/26/2010  . Type 2 diabetes mellitus with neurological complications ([place]) [id number]  . POLYURIA 03/13/2010  . APHTHOUS STOMATITIS 08/23/2009  . CHRONIC RESPIRATORY FAILURE 08/18/2007  . Hyperlipidemia 08/17/2007  . OBESITY 08/17/2007  . CORONARY ARTERY DISEASE 08/17/2007  . COPD with emphysema, Gold D  08/17/2007    Orientation RESPIRATION BLADDER Height & Weight     Self, Time, Situation, Place  Normal Incontinent Weight: 197 lb 4.8 oz (89.495 kg) Height:  5\' 2"  (157.5 cm)   BEHAVIORAL SYMPTOMS/MOOD NEUROLOGICAL BOWEL NUTRITION STATUS      Incontinent Diet (Carb Modified)  AMBULATORY STATUS COMMUNICATION OF NEEDS Skin   Limited Assist   Normal                       Personal Care Assistance Level of Assistance  Bathing, Feeding, Dressing Bathing Assistance: Limited assistance Feeding assistance: Independent Dressing Assistance: Limited assistance     Functional Limitations Info  Sight, Hearing, Speech Sight Info: Adequate Hearing Info: Adequate Speech Info: Adequate    SPECIAL CARE FACTORS FREQUENCY  PT (By licensed PT)     PT Frequency: 5              Contractures      Additional Factors Info  Code Status, Allergies Code Status Info: Full Code Allergies Info: Allergies: Codeine, Levofloxacin, Sulfa Antibiotics           Current Medications (02/20/2016):  This is the current hospital active medication list Current Facility-Administered Medications  Medication Dose Route Frequency Provider Last Rate Last Dose  . 0.9 %  sodium chloride infusion   Intravenous Continuous Alesia Richards, MD 75 mL/hr at 02/20/16 0342    . acetaminophen (TYLENOL) tablet 650 mg  650 mg Oral Q6H PRN Alesia Richards, MD       Or  . acetaminophen (TYLENOL) suppository 650 mg  650 mg Rectal Q6H PRN Alesia Richards, MD      . acidophilus (RISAQUAD) capsule 1 capsule  1 capsule Oral Daily Alesia Richards, MD   1 capsule at 02/20/16 0957  .  albuterol (PROVENTIL) (2.5 MG/3ML) 0.083% nebulizer solution 2.5 mg  2.5 mg Nebulization Q6H PRN Alesia Richards, MD   2.5 mg at 02/20/16 0754  . ALPRAZolam Duanne Moron) tablet 0.125 mg  0.125 mg Oral TID PRN Alesia Richards, MD      . aspirin tablet 325 mg  325 mg Oral QHS Alesia Richards, MD   325 mg at 02/20/16 0238  . atorvastatin (LIPITOR) tablet 40 mg  40 mg Oral QHS Alesia Richards, MD   40 mg at 02/20/16 0238  . budesonide (PULMICORT) nebulizer solution 0.5 mg  0.5 mg Nebulization BID Alesia Richards, MD   0.5 mg at 02/20/16 0754   . clotrimazole (LOTRIMIN) 1 % cream 1 application  1 application Topical BID Alesia Richards, MD   1 application at 0000000 [phone]  . enoxaparin (LOVENOX) injection 40 mg  40 mg Subcutaneous QHS Alesia Richards, MD   40 mg at 02/20/16 0238  . insulin aspart (novoLOG) injection 0-5 Units  0-5 Units Subcutaneous QHS Alesia Richards, MD   0 Units at 02/20/16 0245  . insulin aspart (novoLOG) injection 0-9 Units  0-9 Units Subcutaneous TID WC Alesia Richards, MD   1 Units at 02/20/16 1215  . insulin detemir (LEVEMIR) injection 15 Units  15 Units Subcutaneous QHS Alesia Richards, MD   15 Units at 02/20/16 0238  . ondansetron (ZOFRAN) tablet 4 mg  4 mg Oral Q6H PRN Alesia Richards, MD       Or  . ondansetron [facility]) injection 4 mg  4 mg Intravenous Q6H PRN Alesia Richards, MD      . pantoprazole (PROTONIX) EC tablet 80 mg  80 mg Oral QAC breakfast Alesia Richards, MD   80 mg at 02/20/16 0957  . piperacillin-tazobactam (ZOSYN) IVPB 3.375 g  3.375 g Intravenous Q8H Nance Pear, MD   3.375 g at 02/20/16 1215  . polyethylene glycol (MIRALAX / GLYCOLAX) packet 17 g  17 g Oral Daily PRN Alesia Richards, MD      . sodium chloride flush (NS) 0.9 % injection 3 mL  3 mL Intravenous Q12H Alesia Richards, MD   3 mL at 02/20/16 1000  . tiotropium (SPIRIVA) inhalation capsule 18 mcg  18 mcg Inhalation Daily Alesia Richards, MD   18 mcg at 02/20/16 1000  . vancomycin (VANCOCIN) IVPB 1000 mg/200 mL premix  1,000 mg Intravenous Q18H Alesia Richards, MD         Discharge Medications: Please see discharge summary for a list of discharge medications.  Relevant Imaging Results:  Relevant Lab Results:   Additional Information SSN: [id number]  Darden Dates, LCSW

## 2016-02-20 NOTE — Progress Notes (Signed)
Pt with multiple skin problems that appear to be from either pressure and/or incontinence.    Bilateral lower extremity redness, edema, dry, flaky skin with fungal infection to feet.  Washed legs and feet with soap and water and applied 24 hour moisture cream to legs.  Traumatic injury to right lower anterior shin 5x2x0 scabbed over.  Washed with soap and water and applied 24 hour moisture cream.  Buttocks with rash, excoriation, pimples, and moisture associated contact dermatitis.  Washed with soap and water and applied barrier cream.  Right buttock to thigh covered with excoriation and pimples.  Washed with soap and water and applied barrier cream.  Posterior upper thighs with necrotic tissue either from pressure or incontinence.  Cleaned with soap and water and applied barrier cream.  Right lateral posterior knee with 9x7x.5 pressure injury.  Wound base is necrotic.  Surrounding tissue is red, edematous, with induration. Washed with soap and water and applied a Vaseline gauze dressing secured with kerlix and tape.  Right lateral lower thigh with 18x1x1.5 pressure injury. Wound base is 50% granulation tissue and 50% yellow with some sloughing tissue. Moderate serosang drainage. Washed with soap and water and applied vaseline gauze dressing covered with ABD and then secured with kerlix and tape.  RUQ and RLQ Abdomen with rash. Yellow drainage from RUQ. Washed with soap and water and applied barrier cream.  Wound care consult for further evaluation ordered.  Dorna Bloom RN

## 2016-02-20 NOTE — Consult Note (Signed)
WOC wound consult note Reason for Consult: Bilateral erythema to lower extremities.  Trauma to left lateral thigh from daybed injury.   Wound type:Trauma wounds with cellulitis Pressure Ulcer POA: N/A Measurement:Right lateral upper thigh  4 cm x 0.5 cm x 0.2 cm  Distal lower leg:  2 cm x 2 cm x0.2 cm  Right anterior lower leg 2 cmx 2 cm scabbed lesion, dry and intact Wound [id number] Drainage (amount, consistency, odor) Minimal serosanguinous  No odor.  Periwound:Erythema and tenderness Dressing procedure/placement/frequency:Cleanse right lower leg with soap and water pat dry.  Apply vaseline gauze to nonintact wound beds.  Cover with ABD pad and kerlix.  Change daily.   Will not follow at this time.  Please re-consult if needed.  Domenic Moras RN BSN [place] Pager [phone]

## 2016-02-20 NOTE — H&P (Signed)
[facility] [place] NAME: Kaitlin Henderson    MR#:  [id number]  DATE OF BIRTH:  05-23-1941  DATE OF ADMISSION:  02/19/2016  PRIMARY CARE PHYSICIAN: Gwendolyn Grant, MD   REQUESTING/REFERRING PHYSICIAN: ER physician. Patient is a poor historian.   CHIEF COMPLAINT:   Chief Complaint  Patient presents with  . Weakness    HISTORY OF PRESENT ILLNESS:  Kaitlin Henderson  is a 75 y.o. female with a known history of COPD who said that she chronically wears nasal cannula but couldn't recall the dose, hypertension, hyperlipidemia, non-insulin dependent diabetes mellitus so that she lives in an assisted-living facility but cannot recall the name. Apparently a nurse aide went to check on her today and patient was lying in her feces, she reportedly had maggots crawling on her. Patient said she didn't want to get up after having a bowel movement because she's lazy. Patient reported some fecal incontinence, denied recent fall or low back pain, denied any numbness over her brain, has not had any urinary incontinence. An ambulance was called and patient was brought into the hospital. She denied any change in the frequency of her bowel habits, has not had any diarrhea or constipation. She denied any recent fever, chills or sweating.   On arrival patient's maximum temperature was 100.3, she has mild tachycardia currently but 109, she was tachypneic respiratory rate about 20 with a soft blood pressure 90/46. Patient had a CBC with white blood cell count elevated at 12.5. Should normal neutrophil 77%. Metabolic panel showed a mildly elevated glucose at 151. Lactic acid was 2.0, troponin was less than 0.03. She had chest x-ray which showed findings consistent with emphysema. Patient had found to have erythema on her right lower extremity with an area of skin breakdown on the anterior shin. She said the redness has been there for the past few days, progressively gotten  worse. She denied any Tenderness, so that she typically uses a motorized wheelchair to get around. There is concern for cellulitis so patient was started on vancomycin and IV Zosyn. Shortly after, hospitalist called to admit.   PAST MEDICAL HISTORY:   Past Medical History  Diagnosis Date  . Breast cancer, right breast ([place]) 1998    s/p chemo & XRT; right mastectomy  . Obesity   . Coronary artery disease   . Aphthous stomatitis   . Depression   . Diabetes mellitus, type 2 ([place]) 03/2010  . Hyperlipemia   . COPD (chronic obstructive pulmonary disease) ([place])   . NSTEMI (non-ST elevated myocardial infarction) ([place]) 01/2012  . Breast cancer, right breast ([place]) 1998    s/p chemo & XRT, right mastectomy  . OBESITY   . CORONARY ARTERY DISEASE   . APHTHOUS STOMATITIS   . DEPRESSION     started sertraline 09/2010  . DIABETES, TYPE 2 dx 03/2010  . HYPERLIPIDEMIA   . Hypothyroidism   . C O P D     chronic O2 3LPM [place]  . Essential tremor   . NSTEMI (non-ST elevated myocardial infarction) ([place]) 01/2012    med mgmt  . Anginal pain ([place])   . GERD (gastroesophageal reflux disease)   . CHF (congestive heart failure) ([place])     PAST SURGICAL HISTORY:   Past Surgical History  Procedure Laterality Date  . Right mastectomy    . Appendectomy    . Cholecystectomy    . Abdominal hysterectomy    . Right mastectomy    .  Tonsillectomy    . Mastectomy      SOCIAL HISTORY:   Social History  Substance Use Topics  . Smoking status: Former Smoker -- 2.50 packs/day for 54 years    Types: Cigarettes    Quit date: 09/02/1997  . Smokeless tobacco: Never Used     Comment: Retired. Widow/widower since 2008-lives alone. Home hospice since 06/2009 related to COPD  . Alcohol Use: No    FAMILY HISTORY:   Family History  Problem Relation Age of Onset  . Diabetes Neg Hx   . Cancer Neg Hx     DRUG ALLERGIES:   Allergies  Allergen Reactions  . Codeine Other (See Comments)    Pt states that this  medication makes her pass out.   . Levofloxacin Other (See Comments)    Reaction:  Redness   . Sulfa Antibiotics Swelling    REVIEW OF SYSTEMS:   ROS  Review of Systems:  Constitutional: patient reported weight gain, denied, night sweats, Fevers, chills.  HEENT: No headaches, Difficulty swallowing,Tooth/dental problems,Sore throat, Cardio-vascular:  No chest pain, Orthopnea, PND, swelling in lower extremities, anasarca, dizziness, palpitations  GI: No heartburn, indigestion, abdominal pain, nausea, vomiting, diarrhea, change in bowel habits, loss of appetite. Fecal incontinence as above per HPI. Resp: No shortness of breath with exertion or at rest. No excess mucus, no productive cough, No non-productive cough, No coughing up of blood.No change in color of mucus.No wheezing.No chest wall deformity  Skin: Erythema to her right lower extremity with area of skin breakdown as above in history of present illness.  GU: no dysuria, change in color of urine, no urgency or frequency. No flank pain.  Musculoskeletal:  No joint pain or swelling. No decreased range of motion. No back pain.  Psych: No change in mood or affect. No depression or anxiety. No memory loss.  Neuro: No change in sensation, unilateral strength, or cognitive abilities  All other systems were reviewed and are negative.  MEDICATIONS AT HOME:   Prior to Admission medications   Medication Sig Start Date End Date Taking? Authorizing Provider  acidophilus (RISAQUAD) CAPS capsule Take 1 capsule by mouth daily.   Yes Historical Provider, MD  albuterol (PROVENTIL) (2.5 MG/3ML) 0.083% nebulizer solution Take 3 mLs (2.5 mg total) by nebulization every 6 (six) hours as needed for wheezing or shortness of breath. 11/09/15  Yes Richard Leslye Peer, MD  ALPRAZolam Duanne Moron) 0.25 MG tablet Take 0.5 tablets (0.125 mg total) by mouth 3 (three) times daily as needed for anxiety. 12/22/15  Yes Binnie Rail, MD  aspirin 325 MG tablet Take 325 mg by  mouth at bedtime.    Yes Historical Provider, MD  atorvastatin (LIPITOR) 40 MG tablet Take 40 mg by mouth at bedtime.    Yes Historical Provider, MD  budesonide (PULMICORT) 0.5 MG/2ML nebulizer solution Take 0.5 mg by nebulization 2 (two) times daily.   Yes Historical Provider, MD  clotrimazole (LOTRIMIN) 1 % cream Apply 1 application topically 2 (two) times daily. 12/14/15  Yes Bettey Costa, MD  esomeprazole (NEXIUM) 40 MG capsule Take 40 mg by mouth daily at 12 noon.   Yes Historical Provider, MD  insulin aspart (NOVOLOG) 100 UNIT/ML injection Inject 7 Units into the skin 3 (three) times daily with meals.   Yes Historical Provider, MD  insulin detemir (LEVEMIR) 100 UNIT/ML injection Inject 15 Units into the skin at bedtime.   Yes Historical Provider, MD  polyethylene glycol (MIRALAX / GLYCOLAX) packet Take 17 g by mouth daily  as needed for mild constipation.    Yes Historical Provider, MD  tiotropium (SPIRIVA) 18 MCG inhalation capsule Place 1 capsule (18 mcg total) into inhaler and inhale daily. 11/09/15  Yes Richard Leslye Peer, MD  cephALEXin (KEFLEX) 500 MG capsule Take 1 capsule (500 mg total) by mouth 4 (four) times daily. Patient not taking: Reported on 02/19/2016 12/14/15   Bettey Costa, MD  clindamycin (CLEOCIN) 300 MG capsule Take 1 capsule (300 mg total) by mouth 3 (three) times daily. Patient not taking: Reported on 02/19/2016 12/14/15   Bettey Costa, MD      VITAL SIGNS:  Blood pressure 98/46, pulse 104, temperature 100.3 F (37.9 C), temperature source Rectal, resp. rate 21, height 5' 2.5" (1.588 m), weight 93.123 kg (205 lb 4.8 oz), SpO2 90 %.  PHYSICAL EXAMINATION:  Physical Exam  GENERAL:  75 y.o.-year-old patient lying in the bed with no acute distress.  EYES: Pupils equal, round, reactive to light and accommodation. No scleral icterus. Extraocular muscles intact.  HEENT: Head atraumatic, normocephalic. Oropharynx and nasopharynx clear.  NECK:  Supple, no jugular venous distention. No  thyroid enlargement, no tenderness.  LUNGS: Normal breath sounds bilaterally, no wheezing, rales,rhonchi or crepitation. No use of accessory muscles of respiration.  CARDIOVASCULAR: S1, S2 normal. No murmurs, rubs, or gallops.  ABDOMEN: Soft, nontender, nondistended. Bowel sounds present. No organomegaly or mass.  EXTREMITIES: No pedal edema, cyanosis, or clubbing.  NEUROLOGIC: Cranial nerves II through XII are intact. Muscle strength 5/5 in all extremities. Sensation intact. Gait not checked.  PSYCHIATRIC: The patient is alert and oriented x 3.  SKIN: No obvious rash, lesion, or ulcer.   LABORATORY PANEL:   CBC  Recent Labs Lab 02/19/16 1949  WBC 12.5*  HGB 14.5  HCT 43.4  PLT 352   ------------------------------------------------------------------------------------------------------------------  Chemistries   Recent Labs Lab 02/19/16 1949  NA 138  K 4.0  CL 93*  CO2 34*  GLUCOSE 151*  BUN 15  CREATININE 0.86  CALCIUM 9.1  AST 21  ALT 12*  ALKPHOS 71  BILITOT 0.8   ------------------------------------------------------------------------------------------------------------------  Cardiac Enzymes  Recent Labs Lab 02/19/16 1949  TROPONINI <0.03   ------------------------------------------------------------------------------------------------------------------  RADIOLOGY:  Dg Chest Port 1 View  02/19/2016  CLINICAL DATA:  Right-sided skin breakdown, patient found lying on floor an independent living facility, potentially 4 days. Home oxygen. COPD. EXAM: PORTABLE CHEST 1 VIEW COMPARISON:  12/12/2015 FINDINGS: Right axillary clips. Continued mild asymmetric density at the right lung apex. Atherosclerotic aortic arch. Left rib deformities similar to prior, likely from healed fractures. No pneumothorax. Heart size within normal limits for the AP projection. Mild interstitial accentuation in both lungs, similar to prior. No pneumonia is identified. Emphysema. IMPRESSION:  1. Increased interstitial accentuation compared to 12/12/2015, potentially a manifestation of atypical infection or pulmonary venous hypertension, although no cardiomegaly is evident. 2. Emphysema. 3. Right apical density is not appreciably changed. This had a worrisome appearance for carcinoma on chest CT, although asymmetric density has been present at the right lung apex for at least 9 years. 4. Atherosclerotic aortic arch. Electronically Signed   By: Van Clines M.D.   On: 02/19/2016 20:19      IMPRESSION AND PLAN:   46F admitted for right leg cellulitis.  1. Cellulitis. Patient with errythema to right lower leg. Continue Abx started, f/u blood culture. Check CRP. Check [place] lower extremity.  2. Anxiety. PRN xanax  3. HLD. Continue atorvastatin.   4. COPD. Patient's currently on 4 L nasal cannula. We'll  continue with her routine Pulmicort twice a day, aspirin and when necessary albuterol. Try to wean submittal oxygen. Try keep oxygen saturation greater 80%.  5. GERD. Contineu esomeprazole 40 mg daily  6. NIDDM. Continue Levemir 15 QHS, ISF sliding scale  7. DVT prophylaxis. Lovenox daily.     All the records are reviewed and case discussed with ED provider. Management plans discussed with the patient, family and they are in agreement.  CODE STATUS: Full code  TOTAL TIME TAKING CARE OF THIS PATIENT: 40 minutes.    Alesia Richards M.D on 02/20/2016 at 1:01 AM  Between 7am to 6pm - Pager - [phone]  After 6pm go to [email or web address] - Proofreader  [facility]  Office  [phone]  CC: Primary care physician; Gwendolyn Grant, MD   Note: This dictation was prepared with Dragon dictation along with smaller phrase technology. Any transcriptional errors that result from this process are unintentional.

## 2016-02-20 NOTE — Progress Notes (Addendum)
Pharmacy Antibiotic Note  Kaitlin Henderson is a 75 y.o. female admitted on 02/19/2016 with sepsis and RLE cellulitis.  Pharmacy has been consulted for Zosyn and vancomycin dosing.  Plan: 1. Zosyn 3.375 gm IV Q8H EI  2. Current orders for vancomycin 1 gm IV x 1 in ED followed in 8 hours (stacked dosing) by vancomycin 750 mg IV Q12H.  Ke 0.042, half life 16.5 h, Vd 46 L. Pt at risk for accumulating doses, will adjust dose to vancomycin 1000 mg IV q18h. Pharmacy will continue to follow and adjust as needed to maintain trough 15 to 20 mcg/mL. Vanc trough 6/22 at 0930.   Height: 5\' 2"  (157.5 cm) Weight: 197 lb 4.8 oz (89.495 kg) IBW/kg (Calculated) : 50.1  Temp (24hrs), Avg:99.2 F (37.3 C), Min:98.1 F (36.7 C), Max:100.3 F (37.9 C)   Recent Labs Lab 02/19/16 1949 02/19/16 1951  WBC 12.5*  --   CREATININE 0.86  --   LATICACIDVEN  --  2.0    Estimated Creatinine Clearance: 58.8 mL/min (by C-G formula based on Cr of 0.86).    Allergies  Allergen Reactions  . Codeine Other (See Comments)    Pt states that this medication makes her pass out.   . Levofloxacin Other (See Comments)    Reaction:  Redness   . Sulfa Antibiotics Swelling   Vanc/Zosyn  6/19>>  6/19 BCx x2 NGTD 6/19 UCx sent    Thank you for allowing pharmacy to be a part of this patient's care.  Rocky Morel, Pharm.D., BCPS Clinical Pharmacist 02/20/2016 8:44 AM

## 2016-02-20 NOTE — Progress Notes (Signed)
PT Cancellation Note  Patient Details Name: Kaitlin Henderson MRN: [id number] DOB: 1941/07/07   Cancelled Treatment:    Reason Eval/Treat Not Completed: Fatigue/lethargy limiting ability to participate;Patient declined, no reason specified Pt lying in bed resting comfortably, does not want to attempt any OOB mobility at this time.  Pt encouraged to begin moving/getting up to chair despite not feeling well and pt very clear with her desire not to do anything today other than rest.  Explained to pt need for PT eval to assist with discharge planning.  Pt voiced understanding.  Will continue to follow.  Leman Martinek A Anuj Summons, PT 02/20/2016, 11:37 AM

## 2016-02-20 NOTE — Progress Notes (Signed)
Pt refused labs this AM.  Lab to return later this morning. Dorna Bloom RN

## 2016-02-21 LAB — BASIC METABOLIC PANEL
ANION GAP: 3 — AB (ref 5–15)
BUN: 10 mg/dL (ref 6–20)
CO2: 30 mmol/L (ref 22–32)
Calcium: 8 mg/dL — ABNORMAL LOW (ref 8.9–10.3)
Chloride: 109 mmol/L (ref 101–111)
Creatinine, Ser: 0.81 mg/dL (ref 0.44–1.00)
GLUCOSE: 97 mg/dL (ref 65–99)
POTASSIUM: 3.3 mmol/L — AB (ref 3.5–5.1)
Sodium: 142 mmol/L (ref 135–145)

## 2016-02-21 LAB — CBC
HCT: 33.3 % — ABNORMAL LOW (ref 35.0–47.0)
Hemoglobin: 11.4 g/dL — ABNORMAL LOW (ref 12.0–16.0)
MCH: 32.1 pg (ref 26.0–34.0)
MCHC: 34.3 g/dL (ref 32.0–36.0)
MCV: 93.7 fL (ref 80.0–100.0)
PLATELETS: 253 10*3/uL (ref 150–440)
RBC: 3.56 MIL/uL — AB (ref 3.80–5.20)
RDW: 13.1 % (ref 11.5–14.5)
WBC: 6.5 10*3/uL (ref 3.6–11.0)

## 2016-02-21 LAB — URINE CULTURE: Culture: NO GROWTH

## 2016-02-21 LAB — GLUCOSE, CAPILLARY
GLUCOSE-CAPILLARY: 92 mg/dL (ref 65–99)
GLUCOSE-CAPILLARY: 118 mg/dL — AB (ref 65–99)
GLUCOSE-CAPILLARY: 119 mg/dL — AB (ref 65–99)
Glucose-Capillary: 147 mg/dL — ABNORMAL HIGH (ref 65–99)

## 2016-02-21 MED ORDER — SODIUM CHLORIDE 0.9 % IV SOLN
3.0000 g | Freq: Four times a day (QID) | INTRAVENOUS | Status: DC
  Administered 2016-02-21 – 2016-02-23 (×7): 3 g via INTRAVENOUS
  Filled 2016-02-21 (×10): qty 3

## 2016-02-21 MED ORDER — POTASSIUM CHLORIDE CRYS ER 20 MEQ PO TBCR
40.0000 meq | EXTENDED_RELEASE_TABLET | Freq: Once | ORAL | Status: AC
  Administered 2016-02-21: 08:00:00 40 meq via ORAL
  Filled 2016-02-21: qty 2

## 2016-02-21 MED ORDER — GUAIFENESIN-DM 100-10 MG/5ML PO SYRP: 5.0000 mL | ORAL_SOLUTION | ORAL | Status: DC | PRN

## 2016-02-21 NOTE — Clinical Social Work Placement (Signed)
   CLINICAL SOCIAL WORK PLACEMENT  NOTE  Date:  02/21/2016  Patient Details  Name: DEVERA MOY MRN: [id number] Date of Birth: [birth year]  Clinical Social Work is seeking post-discharge placement for this patient at the [place] level of care (*CSW will initial, date and re-position this form in  chart as items are completed):  Yes   Patient/family provided with [facility] Work Department's list of facilities offering this level of care within the geographic area requested by the patient (or if unable, by the patient's family).  Yes   Patient/family informed of their freedom to choose among providers that offer the needed level of care, that participate in Medicare, Medicaid or managed care program needed by the patient, have an available bed and are willing to accept the patient.  Yes   Patient/family informed of [facility]'s ownership interest in [facility] and [facility], as well as of the fact that they are under no obligation to receive care at these facilities.  PASRR submitted to EDS on       PASRR number received on       Existing PASRR number confirmed on 02/21/16     FL2 transmitted to all facilities in geographic area requested by pt/family on 02/21/16     FL2 transmitted to all facilities within larger geographic area on       Patient informed that his/her managed care company has contracts with or will negotiate with certain facilities, including the following:        Yes   Patient/family informed of bed offers received.  Patient chooses bed at  [facility] [place], [place]) )     Physician recommends and patient chooses bed at      Patient to be transferred to   on  .  Patient to be transferred to facility by       Patient family notified on   of transfer.  Name of family member notified:        PHYSICIAN       Additional Comment:    _______________________________________________ Loralyn Freshwater,  LCSW 02/21/2016, 4:24 PM

## 2016-02-21 NOTE — Progress Notes (Signed)
Physical Therapy Evaluation Patient Details Name: Kaitlin Henderson MRN: [id number] DOB: [birth year] Today's Date: 02/21/2016   History of Present Illness  Kaitlin Henderson is a 75 y.o. female with a known history of COPD who said that she chronically wears nasal cannula but couldn't recall the dose, hypertension, hyperlipidemia, non-insulin dependent diabetes mellitus so that she lives in an assisted-living facility.  Pt admitted for R LE cellulitis.  Clinical Impression  Pt presents to PT tearful and not wanting to get up, stating she does not feel good.  Pt agreeable after discussion about discharge planning.  Pt transfers supine to sit with Mod A +2 and from bed>BSC>chair with Mod A +2.  Pt incontinent of stool upon initial standing and required total assist for personal hygiene and peri care.  Pt able to use RW for transfers and weight shift during transfers, with +2 assist for safety and balance.  Pt with O2 throughout session and non-productive coughs during transfers.  Pt would benefit from acute PT services to address objective findings.    Follow Up Recommendations SNF    Equipment Recommendations  None recommended by PT    Recommendations for Other Services       Precautions / Restrictions Precautions Precautions: Fall Precaution Comments: High Restrictions Weight Bearing Restrictions: No      Mobility  Bed Mobility Overal bed mobility: Needs Assistance Bed Mobility: Supine to Sit     Supine to sit: Mod assist;+2 for physical assistance     General bed mobility comments: Pt assists with sliding legs to EOB and grabbing rails; assist to elevate trunk and rotate hips to EOB  Transfers Overall transfer level: Needs assistance Equipment used: Rolling walker (2 wheeled) Transfers: Sit to/from Stand Sit to Stand: Mod assist;+2 physical assistance         General transfer comment: Sit<>stand with Mod A +2, pushing from bed/BSC with R  UE  Ambulation/Gait Ambulation/Gait assistance: Mod assist;+2 physical assistance Ambulation Distance (Feet): 3 Feet Assistive device: Rolling walker (2 wheeled) Gait Pattern/deviations: Shuffle     General Gait Details: Bed>BSC>recliner with Mod A +2; pt able to weight shift and shuffle feet along floor, assist for balance  Stairs            Wheelchair Mobility    Modified [name] (Stroke Patients Only)       Balance Overall balance assessment: Needs assistance         Standing balance support: Bilateral upper extremity supported;During functional activity Standing balance-Leahy Scale: Fair                               Pertinent Vitals/Pain Pain Assessment: 0-10 Pain Location: R LE with movement    Home Living Family/patient expects to be discharged to:: Skilled nursing facility                 Additional Comments: Pt from independnet living apartment, does not want to go back; sleeps in day bed and transfers to electric w/c for mobility    Prior Function Level of Independence: Independent with assistive device(s)         Comments: Pt from independnet living apartment, does not want to go back; sleeps in day bed and transfers to electric w/c for mobility     Hand Dominance        Extremity/Trunk Assessment   Upper Extremity Assessment: Generalized weakness           Lower  Extremity Assessment: Generalized weakness         Communication   Communication: No difficulties  Cognition Arousal/Alertness: Awake/alert Behavior During Therapy: WFL for tasks assessed/performed Overall Cognitive Status: Within Functional Limits for tasks assessed                      General Comments General comments (skin integrity, edema, etc.): multiple areas of skin breakdown on buttocks, covered with banndages; L LE open wound on shin; R LE errythemia    Exercises        Assessment/Plan    PT Assessment Patient needs  continued PT services  PT Diagnosis Generalized weakness;Acute pain   PT Problem List Decreased strength;Decreased activity tolerance;Decreased balance;Decreased mobility;Decreased skin integrity;Pain  PT Treatment Interventions Functional mobility training;Therapeutic activities;Therapeutic exercise;Balance training;Gait training;Patient/family education   PT Goals (Current goals can be found in the Care Plan section) Acute Rehab PT Goals Patient Stated Goal: To go to skilled nursing. PT Goal Formulation: With patient Time For Goal Achievement: 02/28/16 Potential to Achieve Goals: Fair    Frequency Min 2X/week   Barriers to discharge Decreased caregiver support      Co-evaluation               End of Session Equipment Utilized During Treatment: Gait belt;Oxygen Activity Tolerance: Patient limited by fatigue Patient left: in chair;with call bell/phone within reach;with chair alarm set;with nursing/sitter in room Nurse Communication: Mobility status         Time: [id number] PT Time Calculation (min) (ACUTE ONLY): 28 min   Charges:   PT Evaluation $PT Eval Moderate Complexity: 1 Procedure     PT G Codes:        Renner Sebald A Allan Minotti, PT 02/21/2016, 12:29 PM

## 2016-02-21 NOTE — Progress Notes (Signed)
[place] [facility] [place] NAME: Lenesha Waxman    MR#:  [id number]  DATE OF BIRTH:  24-Oct-1940  SUBJECTIVE:  CHIEF COMPLAINT:   Chief Complaint  Patient presents with  . Weakness   - Improving left lower extremity edema and erythema - on IV ABX for cellulitis , feels weak - Does not want to go back to the same facility that she came from  REVIEW OF SYSTEMS:  Review of Systems  Constitutional: Positive for malaise/fatigue. Negative for fever and chills.  HENT: Negative for congestion and nosebleeds.   Eyes: Negative for blurred vision and double vision.  Respiratory: Negative for cough, shortness of breath, wheezing and stridor.   Cardiovascular: Positive for leg swelling. Negative for chest pain and palpitations.  Gastrointestinal: Negative for nausea, vomiting, abdominal pain, diarrhea and constipation.  Genitourinary: Negative for dysuria.  Skin: Positive for rash.  Neurological: Negative for dizziness, sensory change, speech change, focal weakness, seizures and headaches.  Psychiatric/Behavioral: Negative for depression.    DRUG ALLERGIES:   Allergies  Allergen Reactions  . Codeine Other (See Comments)    Pt states that this medication makes her pass out.   . Levofloxacin Other (See Comments)    Reaction:  Redness   . Sulfa Antibiotics Swelling    VITALS:  Blood pressure 112/47, pulse 85, temperature 98.4 F (36.9 C), temperature source Oral, resp. rate 18, height 5\' 2"  (1.575 m), weight 89.495 kg (197 lb 4.8 oz), SpO2 87 %.  PHYSICAL EXAMINATION:  Physical Exam  GENERAL:  75 y.o.-year-old patient lying in the bed with no acute distress. Disheleved appearing EYES: Pupils equal, round, reactive to light and accommodation. No scleral icterus. Extraocular muscles intact.  HEENT: Head atraumatic, normocephalic. Oropharynx and nasopharynx clear.  NECK:  Supple, no jugular venous distention. No thyroid enlargement, no  tenderness.  LUNGS: Normal breath sounds bilaterally, no wheezing, rales,rhonchi or crepitation. No use of accessory muscles of respiration. Decreased bibasilar breath sounds CARDIOVASCULAR: S1, S2 normal. No murmurs, rubs, or gallops.  ABDOMEN: Soft, obese, nontender, nondistended. Bowel sounds present. No organomegaly or mass.  EXTREMITIES: No  cyanosis, or clubbing. 2+ pedal edema present, improving erythema of Left lower extremity NEUROLOGIC: Cranial nerves II through XII are intact. Muscle strength 3/5 in all extremities. Global weakness noted. Sensation intact. Gait not checked.  PSYCHIATRIC: The patient is alert and oriented x 3.  SKIN: Erythema of both lower extremities worse on the left side. Skin breakdown occasionally and pressure unstageable ulcers on the back of thigh   LABORATORY PANEL:   CBC  Recent Labs Lab 02/21/16 0555  WBC 6.5  HGB 11.4*  HCT 33.3*  PLT 253   ------------------------------------------------------------------------------------------------------------------  Chemistries   Recent Labs Lab 02/19/16 1949  02/21/16 0555  NA 138  < > 142  K 4.0  < > 3.3*  CL 93*  < > 109  CO2 34*  < > 30  GLUCOSE 151*  < > 97  BUN 15  < > 10  CREATININE 0.86  < > 0.81  CALCIUM 9.1  < > 8.0*  AST 21  --   --   ALT 12*  --   --   ALKPHOS 71  --   --   BILITOT 0.8  --   --   < > = values in this interval not displayed. ------------------------------------------------------------------------------------------------------------------  Cardiac Enzymes  Recent Labs Lab 02/19/16 1949  TROPONINI <0.03   ------------------------------------------------------------------------------------------------------------------  RADIOLOGY:  US Venous  Img Lower Unilateral Right  02/20/2016  CLINICAL DATA:  Right lower extremity swelling. EXAM: Right LOWER EXTREMITY VENOUS DOPPLER ULTRASOUND TECHNIQUE: Gray-scale sonography with graded compression, as well as color  Doppler and duplex ultrasound were performed to evaluate the lower extremity deep venous systems from the level of the common femoral vein and including the common femoral, femoral, profunda femoral, popliteal and calf veins including the posterior tibial, peroneal and gastrocnemius veins when visible. The superficial great saphenous vein was also interrogated. Spectral Doppler was utilized to evaluate flow at rest and with distal augmentation maneuvers in the common femoral, femoral and popliteal veins. COMPARISON:  None. FINDINGS: Contralateral Common Femoral Vein: Respiratory phasicity is normal and symmetric with the symptomatic side. No evidence of thrombus. Normal compressibility. Common Femoral Vein: No evidence of thrombus. Normal compressibility, respiratory phasicity and response to augmentation. Saphenofemoral Junction: No evidence of thrombus. Normal compressibility and flow on color Doppler imaging. Profunda Femoral Vein: No evidence of thrombus. Normal compressibility and flow on color Doppler imaging. Femoral Vein: No evidence of thrombus. Normal compressibility, respiratory phasicity and response to augmentation. Popliteal Vein: No evidence of thrombus. Normal compressibility, respiratory phasicity and response to augmentation. Calf Veins: No evidence of thrombus. Normal compressibility and flow on color Doppler imaging. Superficial Great Saphenous Vein: No evidence of thrombus. Normal compressibility and flow on color Doppler imaging. Venous Reflux:  None. Other Findings:  None. IMPRESSION: There is no evidence of deep venous thrombosis seen in right lower extremity. Electronically Signed   By: Marijo Conception, M.D.   On: 02/20/2016 09:49   Dg Chest Port 1 View  02/19/2016  CLINICAL DATA:  Right-sided skin breakdown, patient found lying on floor an independent living facility, potentially 4 days. Home oxygen. COPD. EXAM: PORTABLE CHEST 1 VIEW COMPARISON:  12/12/2015 FINDINGS: Right axillary  clips. Continued mild asymmetric density at the right lung apex. Atherosclerotic aortic arch. Left rib deformities similar to prior, likely from healed fractures. No pneumothorax. Heart size within normal limits for the AP projection. Mild interstitial accentuation in both lungs, similar to prior. No pneumonia is identified. Emphysema. IMPRESSION: 1. Increased interstitial accentuation compared to 12/12/2015, potentially a manifestation of atypical infection or pulmonary venous hypertension, although no cardiomegaly is evident. 2. Emphysema. 3. Right apical density is not appreciably changed. This had a worrisome appearance for carcinoma on chest CT, although asymmetric density has been present at the right lung apex for at least 9 years. 4. Atherosclerotic aortic arch. Electronically Signed   By: Van Clines M.D.   On: 02/19/2016 20:19    EKG:   Orders placed or performed during the hospital encounter of 01/31/16  . EKG 12-Lead  . EKG 12-Lead    ASSESSMENT AND PLAN:   75 year old female with past medical history of COPD on home oxygen, hypertension, hyperlipidemia and diabetes mellitus from assisted living facility who is wheelchair bound at baseline presents to the hospital secondary to left lower extremity cellulitis.  #1 left lower extremity cellulitis-patient seems like she has chronic lower extremity edema from poor venous drainage. -Blood cultures are negative so far -Lower extremity ultrasound is negative for any DVT -change ABX to unasyn today.  Keep the leg elevated. -Appreciate wound nurse consult.  #2 hypokalemia-replaced  #3 COPD-continue oxygen support. Nebs. Also on Pulmicort nebulizer. o 4l o2- repeat CXR or CT chest for RUL changes on adm CXR  #4 diabetes mellitus-on Levemir and sliding scale insulin  #5 GERD- protonix  #6 DVT prophylaxis-on Lovenox.  Physical therapy consult.  Patient might need higher level of care Social worker consult requested   All the  records are reviewed and case discussed with Care Management/Social Workerr. Management plans discussed with the patient, family and they are in agreement.  CODE STATUS: Full Code  TOTAL TIME TAKING CARE OF THIS PATIENT: 37 minutes.   POSSIBLE D/C IN 2 DAYS, DEPENDING ON CLINICAL CONDITION.   Gladstone Lighter M.D on 02/21/2016 at 12:37 PM  Between 7am to 6pm - Pager - [phone]  After 6pm go to [email or web address] - password [email or web address] [facility]  Office  [phone]  CC: Primary care physician; Gwendolyn Grant, MD

## 2016-02-21 NOTE — Clinical Documentation Improvement (Signed)
Internal Medicine Please update your documentation within the medical record to reflect your response to this query. Thank you  Would you further specified  the diagnosis of systemic infection?  Possible/ likely/ probable conditions:  Sepsis - specify causative organism if known}  Other  Clinically Undetermined  Document any associated diagnoses/conditions.  Supporting Information: 02/19/16  H&P "On arrival patient's maximum temperature was 100.3, she has mild tachycardia currently but 109, she was tachypneic respiratory rate about 20 with a soft blood pressure 90/46. Patient had a CBC with white blood cell count elevated at 12.5.".Marland KitchenMarland Kitchen"Lactic acid was 2.0,..."There is concern for cellulitis so patient was started on vancomycin and IV Zosyn.".Marland KitchenMarland Kitchen  Please exercise your independent, professional judgment when responding. A specific answer is not anticipated or expected.  Thank You,  Ermelinda Das, RN, BSN, [place] Certified Clinical Documentation Specialist [place]: Health Information Management [phone]

## 2016-02-21 NOTE — Clinical Social Work Note (Signed)
Clinical Social Work Assessment  Patient Details  Name: Kaitlin Henderson MRN: [id number] Date of Birth: [birth year]  Date of referral:  02/21/16               Reason for consult:  Facility Placement                Permission sought to share information with:  Chartered certified accountant granted to share information::  Yes, Verbal Permission Granted  Name::      [place]::   [place]   Relationship::     Contact Information:     Housing/Transportation Living arrangements for the past 2 months:  [place] of Information:  Patient Patient Interpreter Needed:  None Criminal Activity/Legal Involvement Pertinent to Current Situation/Hospitalization:  No - Comment as needed Significant Relationships:  None Lives with:  Self Do you feel safe going back to the place where you live?  No Need for family participation in patient care:  Yes (Comment)  Care giving concerns: Patient lives at [facility].    Social Worker assessment / plan:  Holiday representative (CSW) is familiar with patient from previous admissions. Patient was in the ED on 01/31/16 and 01/22/16. Patient was unable to place patient from the ED at that time because patient did not have a 3 night qualifying inpatient stay and she did not have Medicaid. Patient is currently inpatient as of 02/20/16 and PT is recommending SNF. CSW met with patient alone at bedside. Patient was alert and oriented and was laying in the bed. CSW introduced self and explained role of CSW department. Patient reported that she lives at [facility] however she cannot go back and needs SNF. Per patient she is not talking to her son or daughter at this time. Patient requested to go to [facility] in [place] because she has been there before.   FL2 complete and faxed out. CSW presented bed offers and patient chose [facility]. [facility] admissions coordinator at [facility] is aware of  accepted bed offer. Patient will D/C to [facility] on Friday 02/23/16 pending medical clearance.   Employment status:  Disabled (Comment on whether or not currently receiving Disability), Retired Forensic scientist:  Medicare PT Recommendations:  [place] / Referral to community resources:  [place]  Patient/Family's Response to care:  Patient is agreeable to go to [facility] in [place].   Patient/Family's Understanding of and Emotional Response to Diagnosis, Current Treatment, and Prognosis:  Patient was pleasant and thanked CSW for visit.   Emotional Assessment Appearance:  Appears stated age Attitude/Demeanor/Rapport:    Affect (typically observed):  Accepting, Adaptable, Pleasant Orientation:  Oriented to Self, Oriented to Place, Oriented to  Time, Oriented to Situation Alcohol / Substance use:  Not Applicable Psych involvement (Current and /or in the community):  No (Comment)  Discharge Needs  Concerns to be addressed:  Discharge Planning Concerns Readmission within the last 30 days:  No Current discharge risk:  Dependent with Mobility, Chronically ill Barriers to Discharge:  Continued Medical Work up   Elwyn Reach 02/21/2016, 4:25 PM

## 2016-02-21 NOTE — Consult Note (Signed)
Pharmacy Antibiotic Note  Kaitlin Henderson is a 75 y.o. female admitted on 02/19/2016 with cellulitis.  Pharmacy has been consulted for unasyn dosing.  Plan: unasyn 3g q 6 hours  Height: 5\' 2"  (157.5 cm) Weight: 197 lb 4.8 oz (89.495 kg) IBW/kg (Calculated) : 50.1  Temp (24hrs), Avg:98.5 F (36.9 C), Min:98.4 F (36.9 C), Max:98.5 F (36.9 C)   Recent Labs Lab 02/19/16 1949 02/19/16 1951 02/20/16 1112 02/21/16 0555  WBC 12.5*  --  7.3 6.5  CREATININE 0.86  --  0.83 0.81  LATICACIDVEN  --  2.0  --   --     Estimated Creatinine Clearance: 62.4 mL/min (by C-G formula based on Cr of 0.81).    Allergies  Allergen Reactions  . Codeine Other (See Comments)    Pt states that this medication makes her pass out.   . Levofloxacin Other (See Comments)    Reaction:  Redness   . Sulfa Antibiotics Swelling    Antimicrobials this admission: Zosyn 6/19>>6/21 Vancomycin 6/19>>6/21 Unasyn 6/21  Dose adjustments this admission:   Microbiology results: Recent Results (from the past 240 hour(s))  Blood Culture (routine x 2)     Status: None (Preliminary result)   Collection Time: 02/19/16  7:49 PM  Result Value Ref Range Status   Specimen Description BLOOD RIGHT FATTY CASTS  Final   Special Requests BOTTLES DRAWN AEROBIC AND ANAEROBIC  12CC  Final   Culture NO GROWTH < 24 HOURS  Final   Report Status PENDING  Incomplete  Blood Culture (routine x 2)     Status: None (Preliminary result)   Collection Time: 02/19/16  7:49 PM  Result Value Ref Range Status   Specimen Description BLOOD RIGHT FATTY CASTS  Final   Special Requests   Final    BOTTLES DRAWN AEROBIC AND ANAEROBIC  AERI [place] ANA 10CC   Culture NO GROWTH < 24 HOURS  Final   Report Status PENDING  Incomplete  Urine culture     Status: None   Collection Time: 02/19/16  9:55 PM  Result Value Ref Range Status   Specimen Description URINE, RANDOM  Final   Special Requests NONE  Final   Culture NO GROWTH Performed at  [facility]   Final   Report Status 02/21/2016 FINAL  Final  MRSA PCR Screening     Status: None   Collection Time: 02/20/16  2:09 AM  Result Value Ref Range Status   MRSA by PCR NEGATIVE NEGATIVE Final    Comment:        The GeneXpert MRSA Assay (FDA approved for NASAL specimens only), is one component of a comprehensive MRSA colonization surveillance program. It is not intended to diagnose MRSA infection nor to guide or monitor treatment for MRSA infections.      Thank you for allowing pharmacy to be a part of this patient's care.  Ramond Dial, Pharm.D Clinical Pharmacist   02/21/2016 1:46 PM

## 2016-02-22 ENCOUNTER — Inpatient Hospital Stay: Payer: Medicare Other

## 2016-02-22 LAB — CBC
HCT: 34.2 % — ABNORMAL LOW (ref 35.0–47.0)
Hemoglobin: 11.9 g/dL — ABNORMAL LOW (ref 12.0–16.0)
MCH: 33.2 pg (ref 26.0–34.0)
MCHC: 34.8 g/dL (ref 32.0–36.0)
MCV: 95.5 fL (ref 80.0–100.0)
Platelets: 251 10*3/uL (ref 150–440)
RBC: 3.58 MIL/uL — ABNORMAL LOW (ref 3.80–5.20)
RDW: 12.7 % (ref 11.5–14.5)
WBC: 6.5 10*3/uL (ref 3.6–11.0)

## 2016-02-22 LAB — GLUCOSE, CAPILLARY
GLUCOSE-CAPILLARY: 85 mg/dL (ref 65–99)
GLUCOSE-CAPILLARY: 127 mg/dL — AB (ref 65–99)
Glucose-Capillary: 106 mg/dL — ABNORMAL HIGH (ref 65–99)
Glucose-Capillary: 138 mg/dL — ABNORMAL HIGH (ref 65–99)

## 2016-02-22 LAB — BASIC METABOLIC PANEL
Anion gap: 5 (ref 5–15)
BUN: 8 mg/dL (ref 6–20)
CALCIUM: 8 mg/dL — AB (ref 8.9–10.3)
CO2: 33 mmol/L — ABNORMAL HIGH (ref 22–32)
CREATININE: 0.86 mg/dL (ref 0.44–1.00)
Chloride: 108 mmol/L (ref 101–111)
GFR calc non Af Amer: >60 mL/min (ref >60)
Glucose, Bld: 103 mg/dL — ABNORMAL HIGH (ref 65–99)
Potassium: 3.8 mmol/L (ref 3.5–5.1)
SODIUM: 146 mmol/L — AB (ref 135–145)

## 2016-02-22 MED ORDER — PANTOPRAZOLE SODIUM 40 MG PO TBEC
40.0000 mg | DELAYED_RELEASE_TABLET | Freq: Every day | ORAL | Status: DC
  Administered 2016-02-22 – 2016-02-23 (×2): 40 mg via ORAL
  Filled 2016-02-22: qty 1

## 2016-02-22 NOTE — Plan of Care (Signed)
Problem: Skin Integrity: Goal: Risk for impaired skin integrity will decrease Outcome: Progressing Bilateral buttock, posterior thighs with sloughing necrotic tissue improving.  Purple deep tissue injury present under slough.

## 2016-02-22 NOTE — Care Management Important Message (Signed)
Important Message  Patient Details  Name: Kaitlin Henderson MRN: [id number] Date of Birth: Sep 29, 1940   Medicare Important Message Given:  Yes    Juliann Pulse A [place] 02/22/2016, 10:53 AM

## 2016-02-22 NOTE — Progress Notes (Signed)
[place] at [place] NAME: Kaitlin Henderson    MR#:  [id number]  DATE OF BIRTH:  Aug 09, 1941  SUBJECTIVE:  CHIEF COMPLAINT:   Chief Complaint  Patient presents with  . Weakness   - Improving left lower extremity edema and erythema - on IV ABX for cellulitis , feels weak - Does not want to go back to the same facility that she came from  REVIEW OF SYSTEMS:  Review of Systems  Constitutional: Positive for malaise/fatigue. Negative for fever and chills.  HENT: Negative for congestion and nosebleeds.   Eyes: Negative for blurred vision and double vision.  Respiratory: Negative for cough, shortness of breath, wheezing and stridor.   Cardiovascular: Positive for leg swelling. Negative for chest pain and palpitations.  Gastrointestinal: Negative for nausea, vomiting, abdominal pain, diarrhea and constipation.  Genitourinary: Negative for dysuria.  Skin: Positive for rash.  Neurological: Negative for dizziness, sensory change, speech change, focal weakness, seizures and headaches.  Psychiatric/Behavioral: Negative for depression.    DRUG ALLERGIES:   Allergies  Allergen Reactions  . Codeine Other (See Comments)    Pt states that this medication makes her pass out.   . Levofloxacin Other (See Comments)    Reaction:  Redness   . Sulfa Antibiotics Swelling    VITALS:  Blood pressure 134/59, pulse 80, temperature 98.3 F (36.8 C), temperature source Oral, resp. rate 20, height 5\' 2"  (1.575 m), weight 89.495 kg (197 lb 4.8 oz), SpO2 97 %.  PHYSICAL EXAMINATION:  Physical Exam  GENERAL:  75 y.o.-year-old patient lying in the bed with no acute distress. Disheleved appearing EYES: Pupils equal, round, reactive to light and accommodation. No scleral icterus. Extraocular muscles intact.  HEENT: Head atraumatic, normocephalic. Oropharynx and nasopharynx clear.  NECK:  Supple, no jugular venous distention. No thyroid enlargement, no  tenderness.  LUNGS: Normal breath sounds bilaterally, no wheezing, rales,rhonchi or crepitation. No use of accessory muscles of respiration. Decreased bibasilar breath sounds CARDIOVASCULAR: S1, S2 normal. No murmurs, rubs, or gallops.  ABDOMEN: Soft, obese, nontender, nondistended. Bowel sounds present. No organomegaly or mass.  EXTREMITIES: No  cyanosis, or clubbing. 2+ pedal edema present, improving erythema of Left lower extremity NEUROLOGIC: Cranial nerves II through XII are intact. Muscle strength 3/5 in all extremities. Global weakness noted. Sensation intact. Gait not checked.  PSYCHIATRIC: The patient is alert and oriented x 3.  SKIN: Erythema of both lower extremities worse on the left side. Skin breakdown occasionally and pressure unstageable ulcers on the back of thigh   LABORATORY PANEL:   CBC  Recent Labs Lab 02/22/16 0442  WBC 6.5  HGB 11.9*  HCT 34.2*  PLT 251   ------------------------------------------------------------------------------------------------------------------  Chemistries   Recent Labs Lab 02/19/16 1949  02/22/16 0442  NA 138  < > 146*  K 4.0  < > 3.8  CL 93*  < > 108  CO2 34*  < > 33*  GLUCOSE 151*  < > 103*  BUN 15  < > 8  CREATININE 0.86  < > 0.86  CALCIUM 9.1  < > 8.0*  AST 21  --   --   ALT 12*  --   --   ALKPHOS 71  --   --   BILITOT 0.8  --   --   < > = values in this interval not displayed. ------------------------------------------------------------------------------------------------------------------  Cardiac Enzymes  Recent Labs Lab 02/19/16 1949  TROPONINI <0.03   ------------------------------------------------------------------------------------------------------------------  RADIOLOGY:  No results  found.  EKG:   Orders placed or performed during the hospital encounter of 01/31/16  . EKG 12-Lead  . EKG 12-Lead    ASSESSMENT AND PLAN:   75 year old female with past medical history of COPD on home oxygen,  hypertension, hyperlipidemia and diabetes mellitus from assisted living facility who is wheelchair bound at baseline presents to the hospital secondary to left lower extremity cellulitis.  #1 left lower extremity cellulitis-patient seems like she has chronic lower extremity edema from poor venous drainage. -Blood cultures are negative so far -Lower extremity ultrasound is negative for any DVT -change ABX to unasyn today.  Keep the leg elevated. Much improving- to oral antibiotics tomorrow. -Appreciate wound nurse consult.  #2 hypokalemia-replaced  #3 COPD-continue oxygen support. Nebs. Also on Pulmicort nebulizer. o 4l o2- repeat CXR or CT chest for RUL changes on adm CXR  #4 diabetes mellitus-on Levemir and sliding scale insulin  #5 GERD- protonix  #6 DVT prophylaxis-on Lovenox.  Physical therapy consult. Patient will need higher level of care Social worker consult requested   All the records are reviewed and case discussed with Care Management/Social Workerr. Management plans discussed with the patient, family and they are in agreement.  CODE STATUS: Full Code  TOTAL TIME TAKING CARE OF THIS PATIENT: 37 minutes.   POSSIBLE D/C TOMORROW, DEPENDING ON CLINICAL CONDITION.   Gladstone Lighter M.D on 02/22/2016 at 12:51 PM  Between 7am to 6pm - Pager - [phone]  After 6pm go to [email or web address] - password [email or web address] [facility]  Office  [phone]  CC: Primary care physician; Gwendolyn Grant, MD

## 2016-02-22 NOTE — Clinical Social Work Note (Signed)
Pt has requested to go to [facility] in [place]. CSW contacted facility and they will review referral to determine if the facility is able to accept a LTC Medicaid pt. CSW will continue to follow.   Darden Dates, MSW, LCSW  Clinical Social Worker  [phone]

## 2016-02-23 LAB — GLUCOSE, CAPILLARY
GLUCOSE-CAPILLARY: 85 mg/dL (ref 65–99)
Glucose-Capillary: 176 mg/dL — ABNORMAL HIGH (ref 65–99)

## 2016-02-23 MED ORDER — GUAIFENESIN-DM 100-10 MG/5ML PO SYRP: 5.0000 mL | ORAL_SOLUTION | ORAL | Status: DC | PRN

## 2016-02-23 MED ORDER — AMOXICILLIN-POT CLAVULANATE 875-125 MG PO TABS: 1.0000 | ORAL_TABLET | Freq: Two times a day (BID) | ORAL | Status: DC

## 2016-02-23 MED ORDER — ONDANSETRON HCL 4 MG PO TABS: 4.0000 mg | ORAL_TABLET | Freq: Four times a day (QID) | ORAL | Status: DC | PRN

## 2016-02-23 MED ORDER — PREDNISONE 10 MG (21) PO TBPK: 10.0000 mg | ORAL_TABLET | Freq: Every day | ORAL | Status: DC

## 2016-02-23 MED ORDER — ALPRAZOLAM 0.25 MG PO TABS: 0.2500 mg | ORAL_TABLET | Freq: Three times a day (TID) | ORAL | Status: DC | PRN

## 2016-02-23 MED ORDER — METHYLPREDNISOLONE SODIUM SUCC 125 MG IJ SOLR
60.0000 mg | INTRAMUSCULAR | Status: AC
  Administered 2016-02-23: 10:00:00 60 mg via INTRAVENOUS
  Filled 2016-02-23: qty 2

## 2016-02-23 NOTE — Discharge Summary (Signed)
[facility] NAME: Kaitlin Henderson    MR#:  [id number]  DATE OF BIRTH:  1941/06/15  DATE OF ADMISSION:  02/19/2016 ADMITTING PHYSICIAN: Alesia Richards, MD  DATE OF DISCHARGE: 02/23/16  PRIMARY CARE PHYSICIAN: Gwendolyn Grant, MD    ADMISSION DIAGNOSIS:  Swelling [R60.9] Cellulitis of right lower extremity [L03.115] Skin breakdown [L90.9] Sepsis, due to unspecified organism ([place]) [A41.9]  DISCHARGE DIAGNOSIS:  Active Problems:   Cellulitis   Pressure ulcer   SECONDARY DIAGNOSIS:   Past Medical History  Diagnosis Date  . Breast cancer, right breast ([place]) 1998    s/p chemo & XRT; right mastectomy  . Obesity   . Coronary artery disease   . Aphthous stomatitis   . Depression   . Diabetes mellitus, type 2 ([place]) 03/2010  . Hyperlipemia   . COPD (chronic obstructive pulmonary disease) ([place])   . NSTEMI (non-ST elevated myocardial infarction) ([place]) 01/2012  . Breast cancer, right breast ([place]) 1998    s/p chemo & XRT, right mastectomy  . OBESITY   . CORONARY ARTERY DISEASE   . APHTHOUS STOMATITIS   . DEPRESSION     started sertraline 09/2010  . DIABETES, TYPE 2 dx 03/2010  . HYPERLIPIDEMIA   . Hypothyroidism   . C O P D     chronic O2 3LPM [name]  . Essential tremor   . NSTEMI (non-ST elevated myocardial infarction) ([place]) 01/2012    med mgmt  . Anginal pain ([place])   . GERD (gastroesophageal reflux disease)   . CHF (congestive heart failure) [facility])     HOSPITAL COURSE:   75 year old female with past medical history of COPD on home oxygen, hypertension, hyperlipidemia and diabetes mellitus from assisted living facility who is wheelchair bound at baseline presents to the hospital secondary to left lower extremity cellulitis.  #1 left lower extremity cellulitis-patient seems like she has chronic lower extremity edema from poor venous drainage. -Blood cultures are negative so far -Lower extremity ultrasound is negative  for any DVT -On Unasyn-changed to Augmentin at discharge.[name] Keep the leg elevated -Appreciate wound nurse consult.  #2 hypokalemia-replaced  #3 COPD-continue oxygen support. Nebs. Also on Pulmicort nebulizer. Slight wheezing today- given 1 dose of solumedrol and discharge on prednisone taper Known RUL pulm nodule- stable in size- outpatient follow up for biopsy vs f/u CT in 3 months  #4 diabetes mellitus-on Levemir  #5 GERD- PPI   Physical therapy consult. Patient will need higher level of care Will be discharged to rehabilitation today   DISCHARGE CONDITIONS:   Guarded  CONSULTS OBTAINED:    None   DRUG ALLERGIES:   Allergies  Allergen Reactions  . Codeine Other (See Comments)    Pt states that this medication makes her pass out.   . Levofloxacin Other (See Comments)    Reaction:  Redness   . Sulfa Antibiotics Swelling    DISCHARGE MEDICATIONS:   Current Discharge Medication List    START taking these medications   Details  amoxicillin-clavulanate (AUGMENTIN) 875-125 MG tablet Take 1 tablet by mouth 2 (two) times daily. X 8 more days Qty: 16 tablet, Refills: 0    guaiFENesin-dextromethorphan (ROBITUSSIN DM) 100-10 MG/5ML syrup Take 5 mLs by mouth every 4 (four) hours as needed for cough. Qty: 118 mL, Refills: 0    ondansetron (ZOFRAN) 4 MG tablet Take 1 tablet (4 mg total) by mouth every 6 (six) hours as needed for nausea. Qty: 20 tablet, Refills: 0  predniSONE (STERAPRED UNI-PAK 21 TAB) 10 MG (21) TBPK tablet Take 1 tablet (10 mg total) by mouth daily. 6 tabs PO x 1 day 5 tabs PO x 1 day 4 tabs PO x 1 day 3 tabs PO x 1 day 2 tabs PO x 1 day 1 tab PO x 1 day and stop Qty: 21 tablet, Refills: 0      CONTINUE these medications which have CHANGED   Details  ALPRAZolam (XANAX) 0.25 MG tablet Take 1 tablet (0.25 mg total) by mouth 3 (three) times daily as needed for anxiety. Qty: 20 tablet, Refills: 0      CONTINUE these medications which have NOT  CHANGED   Details  acidophilus (RISAQUAD) CAPS capsule Take 1 capsule by mouth daily.    albuterol (PROVENTIL) (2.5 MG/3ML) 0.083% nebulizer solution Take 3 mLs (2.5 mg total) by nebulization every 6 (six) hours as needed for wheezing or shortness of breath. Qty: 75 mL, Refills: 0    aspirin 325 MG tablet Take 325 mg by mouth at bedtime.     atorvastatin (LIPITOR) 40 MG tablet Take 40 mg by mouth at bedtime.     budesonide (PULMICORT) 0.5 MG/2ML nebulizer solution Take 0.5 mg by nebulization 2 (two) times daily.    clotrimazole (LOTRIMIN) 1 % cream Apply 1 application topically 2 (two) times daily. Qty: 30 g, Refills: 0    esomeprazole (NEXIUM) 40 MG capsule Take 40 mg by mouth daily at 12 noon.    insulin detemir (LEVEMIR) 100 UNIT/ML injection Inject 15 Units into the skin at bedtime.    polyethylene glycol (MIRALAX / GLYCOLAX) packet Take 17 g by mouth daily as needed for mild constipation.     tiotropium (SPIRIVA) 18 MCG inhalation capsule Place 1 capsule (18 mcg total) into inhaler and inhale daily. Qty: 30 capsule, Refills: 0      STOP taking these medications     insulin aspart (NOVOLOG) 100 UNIT/ML injection          DISCHARGE INSTRUCTIONS:    1. PCP follow-up in 1-2 weeks   If you experience worsening of your admission symptoms, develop shortness of breath, life threatening emergency, suicidal or homicidal thoughts you must seek medical attention immediately by calling 911 or calling your MD immediately  if symptoms less severe.  You Must read complete instructions/literature along with all the possible adverse reactions/side effects for all the Medicines you take and that have been prescribed to you. Take any new Medicines after you have completely understood and accept all the possible adverse reactions/side effects.   Please note  You were cared for by a hospitalist during your hospital stay. If you have any questions about your discharge medications or the  care you received while you were in the hospital after you are discharged, you can call the unit and asked to speak with the hospitalist on call if the hospitalist that took care of you is not available. Once you are discharged, your primary care physician will handle any further medical issues. Please note that NO REFILLS for any discharge medications will be authorized once you are discharged, as it is imperative that you return to your primary care physician (or establish a relationship with a primary care physician if you do not have one) for your aftercare needs so that they can reassess your need for medications and monitor your lab values.    Today   CHIEF COMPLAINT:   Chief Complaint  Patient presents with  . Weakness  VITAL SIGNS:  Blood pressure 144/65, pulse 93, temperature 98.5 F (36.9 C), temperature source Oral, resp. rate 18, height 5\' 2"  (1.575 m), weight 89.495 kg (197 lb 4.8 oz), SpO2 90 %.  I/O:   Intake/Output Summary (Last 24 hours) at 02/23/16 0841 Last data filed at 02/23/16 0000  Gross per 24 hour  Intake    540 ml  Output      0 ml  Net    540 ml    PHYSICAL EXAMINATION:   Physical Exam  GENERAL: 75 y.o.-year-old patient lying in the bed with no acute distress. Disheleved appearing EYES: Pupils equal, round, reactive to light and accommodation. No scleral icterus. Extraocular muscles intact.  HEENT: Head atraumatic, normocephalic. Oropharynx and nasopharynx clear.  NECK: Supple, no jugular venous distention. No thyroid enlargement, no tenderness.  LUNGS: Normal breath sounds bilaterally, scattered wheezing today, no rales,rhonchi or crepitation. No use of accessory muscles of respiration. Decreased bibasilar breath sounds CARDIOVASCULAR: S1, S2 normal. No murmurs, rubs, or gallops.  ABDOMEN: Soft, obese, nontender, nondistended. Bowel sounds present. No organomegaly or mass.  EXTREMITIES: No cyanosis, or clubbing. 2+ pedal edema present,  improving erythema of Left lower extremity NEUROLOGIC: Cranial nerves II through XII are intact. Muscle strength 3/5 in all extremities. Global weakness noted. Sensation intact. Gait not checked.  PSYCHIATRIC: The patient is alert and oriented x 3.  SKIN: Erythema of both lower extremities worse on the left side. Skin breakdown occasionally and pressure unstageable ulcers on the back of thigh  DATA REVIEW:   CBC  Recent Labs Lab 02/22/16 0442  WBC 6.5  HGB 11.9*  HCT 34.2*  PLT 251    Chemistries   Recent Labs Lab 02/19/16 1949  02/22/16 0442  NA 138  < > 146*  K 4.0  < > 3.8  CL 93*  < > 108  CO2 34*  < > 33*  GLUCOSE 151*  < > 103*  BUN 15  < > 8  CREATININE 0.86  < > 0.86  CALCIUM 9.1  < > 8.0*  AST 21  --   --   ALT 12*  --   --   ALKPHOS 71  --   --   BILITOT 0.8  --   --   < > = values in this interval not displayed.  Cardiac Enzymes  Recent Labs Lab 02/19/16 1949  TROPONINI <0.03    Microbiology Results  Results for orders placed or performed during the hospital encounter of 02/19/16  Blood Culture (routine x 2)     Status: None (Preliminary result)   Collection Time: 02/19/16  7:49 PM  Result Value Ref Range Status   Specimen Description BLOOD RIGHT FATTY CASTS  Final   Special Requests BOTTLES DRAWN AEROBIC AND ANAEROBIC  12CC  Final   Culture NO GROWTH 3 DAYS  Final   Report Status PENDING  Incomplete  Blood Culture (routine x 2)     Status: None (Preliminary result)   Collection Time: 02/19/16  7:49 PM  Result Value Ref Range Status   Specimen Description BLOOD RIGHT FATTY CASTS  Final   Special Requests   Final    BOTTLES DRAWN AEROBIC AND ANAEROBIC  AERI [place] ANA 10CC   Culture NO GROWTH 3 DAYS  Final   Report Status PENDING  Incomplete  Urine culture     Status: None   Collection Time: 02/19/16  9:55 PM  Result Value Ref Range Status   Specimen Description URINE, RANDOM  Final   Special Requests NONE  Final   Culture NO  GROWTH Performed at [facility]   Final   Report Status 02/21/2016 FINAL  Final  MRSA PCR Screening     Status: None   Collection Time: 02/20/16  2:09 AM  Result Value Ref Range Status   MRSA by PCR NEGATIVE NEGATIVE Final    Comment:        The GeneXpert MRSA Assay (FDA approved for NASAL specimens only), is one component of a comprehensive MRSA colonization surveillance program. It is not intended to diagnose MRSA infection nor to guide or monitor treatment for MRSA infections.     RADIOLOGY:  Ct Chest Wo Contrast  02/22/2016  CLINICAL DATA:  Abnormal chest x-ray with right apical abnormality. Shortness of breath. COPD. EXAM: CT CHEST WITHOUT CONTRAST TECHNIQUE: Multidetector CT imaging of the chest was performed following the standard protocol without IV contrast. COMPARISON:  CT 11/02/2015 FINDINGS: Mediastinum/Lymph Nodes: No masses or pathologically enlarged lymph nodes identified on this un-enhanced exam. Minimal calcified plaque over the left lateral circumflex and right coronary arteries. Calcified plaque over the thoracic aorta. Mild cardiomegaly. Lungs/Pleura: Adequate lung volumes with mild centrilobular emphysema. New small right pleural effusion. Mild atelectasis/scarring adjacent the right major fissure unchanged. Continued evidence of a irregular bordered nodule abutting the pleural surface over the lateral right apex measuring 2 x 2.3 cm without significant change. Airways are within normal. Upper abdomen: Previous cholecystectomy. Mild diverticulosis of the colon. Calcified plaque over the abdominal aorta. Musculoskeletal: Evidence of previous right mastectomy and axillary node dissection. Mild degenerate change of the spine. IMPRESSION: No acute cardiopulmonary disease. Mild emphysematous disease. New small right pleural effusion. Scarring adjacent the right major fissure. Stable irregular bordered 2 x 2.3 cm pleural-based nodule over the lateral right apex. Consider  PET-CT for further evaluation to exclude malignancy. Minimal atherosclerotic coronary artery disease. Aortic atherosclerosis. Mild diverticulosis of the colon. Electronically Signed   By: Marin Olp M.D.   On: 02/22/2016 14:42    EKG:   Orders placed or performed during the hospital encounter of 01/31/16  . EKG 12-Lead  . EKG 12-Lead      Management plans discussed with the patient, family and they are in agreement.  CODE STATUS:     Code Status Orders        Start     Ordered   02/20/16 0054  Full code   Continuous     02/20/16 0057    Code Status History    Date Active Date Inactive Code Status Order ID Comments User Context   12/12/2015 11:19 AM 12/14/2015  5:43 PM Partial Code [id number]  Hillary Bow, MD ED   11/08/2015 10:32 PM 11/09/2015  9:53 PM Full Code [id number]  Lance Coon, MD Inpatient   11/04/2015 10:59 AM 11/08/2015 10:32 PM DNR [id number]  Laverle Hobby, MD Inpatient   11/02/2015  8:23 PM 11/04/2015 10:59 AM Full Code [id number]  Dustin Flock, MD Inpatient   01/17/2012  8:32 PM 01/28/2012  4:49 PM DNR [id number]  Mila Palmer, RN Inpatient      TOTAL TIME TAKING CARE OF THIS PATIENT: 37 minutes.    Gladstone Lighter M.D on 02/23/2016 at 8:41 AM  Between 7am to 6pm - Pager - [phone]  After 6pm go to [email or web address] - password [email or web address] [facility]  Office  [phone]  CC: Primary care physician; Gwendolyn Grant, MD

## 2016-02-23 NOTE — Clinical Social Work Placement (Signed)
   CLINICAL SOCIAL WORK PLACEMENT  NOTE  Date:  02/23/2016  Patient Details  Name: Kaitlin Henderson MRN: [id number] Date of Birth: 1941/01/19  Clinical Social Work is seeking post-discharge placement for this patient at the [place] level of care (*CSW will initial, date and re-position this form in  chart as items are completed):  Yes   Patient/family provided with [facility] Work Department's list of facilities offering this level of care within the geographic area requested by the patient (or if unable, by the patient's family).  Yes   Patient/family informed of their freedom to choose among providers that offer the needed level of care, that participate in Medicare, Medicaid or managed care program needed by the patient, have an available bed and are willing to accept the patient.  Yes   Patient/family informed of [facility]'s ownership interest in [facility] and [facility], as well as of the fact that they are under no obligation to receive care at these facilities.  PASRR submitted to EDS on       PASRR number received on       Existing PASRR number confirmed on 02/21/16     FL2 transmitted to all facilities in geographic area requested by pt/family on 02/21/16     FL2 transmitted to all facilities within larger geographic area on       Patient informed that his/her managed care company has contracts with or will negotiate with certain facilities, including the following:        Yes   Patient/family informed of bed offers received.  Patient chooses bed at [facility]     Physician recommends and patient chooses bed at      Patient to be transferred to [facility] on 02/23/16.  Patient to be transferred to facility by [facility]      Patient family notified on 02/23/16 of transfer.  Name of family member notified:  Pt will update her family/friends     PHYSICIAN       Additional Comment:     _______________________________________________ Darden Dates, LCSW 02/23/2016, 2:44 PM

## 2016-02-23 NOTE — Clinical Social Work Note (Signed)
CSW provided additional bed offers as [facility] did not back a bed offer. Pt chose [facility] as she declined [facility]. CSW updated facility. They are able to accept pt as they have received discharge information. RN will call report and EMS will provide transportation. CSW also updated Rory Percy with [facility] Adult [employer]. CSW is signing off as no further needs identified.   Darden Dates, MSW, LCSW  Clinical Social Worker  [phone]

## 2016-02-23 NOTE — Progress Notes (Signed)
Pt is alert and oriented with forgetfullness on 5 L of o2 [place], patient reports that 5L is her baseline but nursing uncertain if this is accurate. Good appetite, irritable at times, on IV antibiotics, bed bound with a baseline of wheelchair bound, pt is d/c to [facility] care, report given to facility, pt transported via EMS. Wounds on legs changed prior to transport. Incontinent of urine/ stool, bm during shift, vital signs stable, Patients significant other "Randall Hiss" called but no answer, non specific voicemail left to [place] at patients request. Pt will continue on steroids and po antibiotics at rehab.

## 2016-02-24 LAB — CULTURE, BLOOD (ROUTINE X 2)
CULTURE: NO GROWTH
Culture: NO GROWTH

## 2016-04-30 ENCOUNTER — Telehealth: Payer: Self-pay | Admitting: General Practice

## 2016-04-30 NOTE — Telephone Encounter (Signed)
Please follow up on orders for patient for March 31st and May 30th.  States John did sign previous verbal orders.  Needs Plane of Care for both these dates signed to process patient claim.

## 2016-05-02 NOTE — Telephone Encounter (Signed)
Contacted [facility] in [place] and they are faxing the Plan of Care that they need signed.   Contacted pt and she is still in a nursing home. She is calling back to sched with new PCP.

## 2016-05-03 ENCOUNTER — Telehealth: Payer: Self-pay

## 2016-05-03 NOTE — Telephone Encounter (Signed)
Home Health Cert/Plan of Care received signed (12/01/2015 - 01/29/2016). Faxed, copy sent to scan.

## 2016-05-07 ENCOUNTER — Emergency Department: Payer: Medicare Other

## 2016-05-07 ENCOUNTER — Encounter: Payer: Self-pay | Admitting: Emergency Medicine

## 2016-05-07 ENCOUNTER — Inpatient Hospital Stay
Admission: EM | Admit: 2016-05-07 | Discharge: 2016-05-09 | DRG: 189 | Disposition: A | Discharge status: Skilled Nursing Facility | Payer: Medicare Other | Attending: Internal Medicine | Admitting: Internal Medicine

## 2016-05-07 DIAGNOSIS — E119 Type 2 diabetes mellitus without complications: Secondary | ICD-10-CM | POA: Diagnosis present

## 2016-05-07 DIAGNOSIS — J441 Chronic obstructive pulmonary disease with (acute) exacerbation: Secondary | ICD-10-CM | POA: Diagnosis present

## 2016-05-07 DIAGNOSIS — E1149 Type 2 diabetes mellitus with other diabetic neurological complication: Secondary | ICD-10-CM

## 2016-05-07 DIAGNOSIS — E039 Hypothyroidism, unspecified: Secondary | ICD-10-CM | POA: Diagnosis present

## 2016-05-07 DIAGNOSIS — Z87891 Personal history of nicotine dependence: Secondary | ICD-10-CM

## 2016-05-07 DIAGNOSIS — Z888 Allergy status to other drugs, medicaments and biological substances status: Secondary | ICD-10-CM

## 2016-05-07 DIAGNOSIS — Z9221 Personal history of antineoplastic chemotherapy: Secondary | ICD-10-CM | POA: Diagnosis not present

## 2016-05-07 DIAGNOSIS — Z853 Personal history of malignant neoplasm of breast: Secondary | ICD-10-CM | POA: Diagnosis not present

## 2016-05-07 DIAGNOSIS — R000 Tachycardia, unspecified: Secondary | ICD-10-CM | POA: Diagnosis present

## 2016-05-07 DIAGNOSIS — T380X5A Adverse effect of glucocorticoids and synthetic analogues, initial encounter: Secondary | ICD-10-CM | POA: Diagnosis present

## 2016-05-07 DIAGNOSIS — J9621 Acute and chronic respiratory failure with hypoxia: Secondary | ICD-10-CM | POA: Diagnosis present

## 2016-05-07 DIAGNOSIS — Z9981 Dependence on supplemental oxygen: Secondary | ICD-10-CM | POA: Diagnosis not present

## 2016-05-07 DIAGNOSIS — I252 Old myocardial infarction: Secondary | ICD-10-CM

## 2016-05-07 DIAGNOSIS — Z6834 Body mass index (BMI) 34.0-34.9, adult: Secondary | ICD-10-CM

## 2016-05-07 DIAGNOSIS — Z923 Personal history of irradiation: Secondary | ICD-10-CM

## 2016-05-07 DIAGNOSIS — Z882 Allergy status to sulfonamides status: Secondary | ICD-10-CM

## 2016-05-07 DIAGNOSIS — I509 Heart failure, unspecified: Secondary | ICD-10-CM | POA: Diagnosis present

## 2016-05-07 DIAGNOSIS — Z794 Long term (current) use of insulin: Secondary | ICD-10-CM | POA: Diagnosis not present

## 2016-05-07 DIAGNOSIS — E785 Hyperlipidemia, unspecified: Secondary | ICD-10-CM | POA: Diagnosis present

## 2016-05-07 DIAGNOSIS — Z7982 Long term (current) use of aspirin: Secondary | ICD-10-CM | POA: Diagnosis not present

## 2016-05-07 DIAGNOSIS — K219 Gastro-esophageal reflux disease without esophagitis: Secondary | ICD-10-CM | POA: Diagnosis present

## 2016-05-07 DIAGNOSIS — I2510 Atherosclerotic heart disease of native coronary artery without angina pectoris: Secondary | ICD-10-CM | POA: Diagnosis present

## 2016-05-07 DIAGNOSIS — Z886 Allergy status to analgesic agent status: Secondary | ICD-10-CM | POA: Diagnosis not present

## 2016-05-07 DIAGNOSIS — E669 Obesity, unspecified: Secondary | ICD-10-CM | POA: Diagnosis present

## 2016-05-07 DIAGNOSIS — Z79899 Other long term (current) drug therapy: Secondary | ICD-10-CM | POA: Diagnosis not present

## 2016-05-07 DIAGNOSIS — Z66 Do not resuscitate: Secondary | ICD-10-CM | POA: Diagnosis present

## 2016-05-07 DIAGNOSIS — R0682 Tachypnea, not elsewhere classified: Secondary | ICD-10-CM

## 2016-05-07 LAB — COMPREHENSIVE METABOLIC PANEL
ALBUMIN: 3.7 g/dL (ref 3.5–5.0)
ALT: 10 U/L — ABNORMAL LOW (ref 14–54)
AST: 20 U/L (ref 15–41)
Alkaline Phosphatase: 65 U/L (ref 38–126)
Anion gap: 9 (ref 5–15)
BILIRUBIN TOTAL: 0.8 mg/dL (ref 0.3–1.2)
BUN: 24 mg/dL — AB (ref 6–20)
CO2: 31 mmol/L (ref 22–32)
Calcium: 9.1 mg/dL (ref 8.9–10.3)
Chloride: 98 mmol/L — ABNORMAL LOW (ref 101–111)
Creatinine, Ser: 0.87 mg/dL (ref 0.44–1.00)
GFR calc Af Amer: >60 mL/min (ref >60)
GFR calc non Af Amer: >60 mL/min (ref >60)
GLUCOSE: 150 mg/dL — AB (ref 65–99)
POTASSIUM: 4.3 mmol/L (ref 3.5–5.1)
Sodium: 138 mmol/L (ref 135–145)
TOTAL PROTEIN: 7.2 g/dL (ref 6.5–8.1)

## 2016-05-07 LAB — CBC WITH DIFFERENTIAL/PLATELET
BASOS ABS: 0.1 10*3/uL (ref 0–0.1)
Basophils Relative: 1 %
Eosinophils Absolute: 0.2 10*3/uL (ref 0–0.7)
Eosinophils Relative: 2 %
HEMATOCRIT: 47.2 % — AB (ref 35.0–47.0)
HEMOGLOBIN: 15.8 g/dL (ref 12.0–16.0)
Lymphocytes Relative: 20 %
Lymphs Abs: 2.5 10*3/uL (ref 1.0–3.6)
MCH: 31.7 pg (ref 26.0–34.0)
MCHC: 33.4 g/dL (ref 32.0–36.0)
MCV: 94.8 fL (ref 80.0–100.0)
Monocytes Absolute: 0.9 10*3/uL (ref 0.2–0.9)
Monocytes Relative: 7 %
NEUTROS ABS: 9.2 10*3/uL — AB (ref 1.4–6.5)
NEUTROS PCT: 72 %
Platelets: 259 10*3/uL (ref 150–440)
RBC: 4.98 MIL/uL (ref 3.80–5.20)
RDW: 13.8 % (ref 11.5–14.5)
WBC: 12.8 10*3/uL — ABNORMAL HIGH (ref 3.6–11.0)

## 2016-05-07 LAB — TROPONIN I: Troponin I: <0.03 ng/mL (ref <0.03)

## 2016-05-07 LAB — MAGNESIUM: Magnesium: 1.7 mg/dL (ref 1.7–2.4)

## 2016-05-07 LAB — CK: Total CK: 54 U/L (ref 38–234)

## 2016-05-07 LAB — MRSA PCR SCREENING: MRSA by PCR: NEGATIVE

## 2016-05-07 LAB — GLUCOSE, CAPILLARY: GLUCOSE-CAPILLARY: 219 mg/dL — AB (ref 65–99)

## 2016-05-07 MED ORDER — ENOXAPARIN SODIUM 40 MG/0.4ML ~~LOC~~ SOLN
40.0000 mg | SUBCUTANEOUS | Status: DC
  Filled 2016-05-07 (×2): qty 0.4

## 2016-05-07 MED ORDER — ONDANSETRON HCL 4 MG/2ML IJ SOLN: 4.0000 mg | Freq: Four times a day (QID) | INTRAMUSCULAR | Status: DC | PRN

## 2016-05-07 MED ORDER — ASPIRIN 325 MG PO TABS
325.0000 mg | ORAL_TABLET | Freq: Every day | ORAL | Status: DC
  Administered 2016-05-07 – 2016-05-08 (×2): 325 mg via ORAL
  Filled 2016-05-07 (×2): qty 1

## 2016-05-07 MED ORDER — ALBUTEROL SULFATE (2.5 MG/3ML) 0.083% IN NEBU
5.0000 mg | INHALATION_SOLUTION | Freq: Once | RESPIRATORY_TRACT | Status: AC
  Administered 2016-05-07: 2.5 mg via RESPIRATORY_TRACT
  Filled 2016-05-07: qty 6

## 2016-05-07 MED ORDER — ALBUTEROL SULFATE (2.5 MG/3ML) 0.083% IN NEBU: 2.5000 mg | INHALATION_SOLUTION | RESPIRATORY_TRACT | Status: DC | PRN

## 2016-05-07 MED ORDER — INSULIN ASPART 100 UNIT/ML ~~LOC~~ SOLN
0.0000 [IU] | Freq: Three times a day (TID) | SUBCUTANEOUS | Status: DC
  Administered 2016-05-08: 7 [IU] via SUBCUTANEOUS
  Administered 2016-05-08 (×2): 20 [IU] via SUBCUTANEOUS
  Administered 2016-05-09: 7 [IU] via SUBCUTANEOUS
  Administered 2016-05-09: 15 [IU] via SUBCUTANEOUS
  Filled 2016-05-07 (×3): qty 20
  Filled 2016-05-07: qty 7
  Filled 2016-05-07: qty 15
  Filled 2016-05-07: qty 20

## 2016-05-07 MED ORDER — GUAIFENESIN-DM 100-10 MG/5ML PO SYRP: 5.0000 mL | ORAL_SOLUTION | ORAL | Status: DC | PRN

## 2016-05-07 MED ORDER — ALPRAZOLAM 0.25 MG PO TABS
0.2500 mg | ORAL_TABLET | Freq: Three times a day (TID) | ORAL | Status: DC | PRN
  Administered 2016-05-09: 0.25 mg via ORAL
  Filled 2016-05-07: qty 1

## 2016-05-07 MED ORDER — TIOTROPIUM BROMIDE MONOHYDRATE 18 MCG IN CAPS
18.0000 ug | ORAL_CAPSULE | Freq: Every day | RESPIRATORY_TRACT | Status: DC
  Administered 2016-05-07 – 2016-05-09 (×3): 18 ug via RESPIRATORY_TRACT
  Filled 2016-05-07: qty 5

## 2016-05-07 MED ORDER — INSULIN ASPART 100 UNIT/ML ~~LOC~~ SOLN
0.0000 [IU] | Freq: Every day | SUBCUTANEOUS | Status: DC
  Administered 2016-05-07 – 2016-05-08 (×2): 2 [IU] via SUBCUTANEOUS
  Filled 2016-05-07: qty 7
  Filled 2016-05-07 (×3): qty 2

## 2016-05-07 MED ORDER — ATORVASTATIN CALCIUM 20 MG PO TABS
40.0000 mg | ORAL_TABLET | Freq: Every day | ORAL | Status: DC
  Administered 2016-05-07 – 2016-05-08 (×2): 40 mg via ORAL
  Filled 2016-05-07 (×2): qty 2

## 2016-05-07 MED ORDER — AZITHROMYCIN 250 MG PO TABS
250.0000 mg | ORAL_TABLET | Freq: Every day | ORAL | Status: DC
  Administered 2016-05-08 – 2016-05-09 (×2): 250 mg via ORAL
  Filled 2016-05-07 (×2): qty 1

## 2016-05-07 MED ORDER — ALBUTEROL SULFATE (2.5 MG/3ML) 0.083% IN NEBU
2.5000 mg | INHALATION_SOLUTION | Freq: Once | RESPIRATORY_TRACT | Status: AC
  Administered 2016-05-07: 2.5 mg via RESPIRATORY_TRACT
  Filled 2016-05-07: qty 3

## 2016-05-07 MED ORDER — RISAQUAD PO CAPS
1.0000 | ORAL_CAPSULE | Freq: Every day | ORAL | Status: DC
  Administered 2016-05-08 – 2016-05-09 (×2): 1 via ORAL
  Filled 2016-05-07 (×2): qty 1

## 2016-05-07 MED ORDER — METHYLPREDNISOLONE SODIUM SUCC 125 MG IJ SOLR
60.0000 mg | Freq: Once | INTRAMUSCULAR | Status: AC
  Administered 2016-05-07: 60 mg via INTRAVENOUS
  Filled 2016-05-07: qty 2

## 2016-05-07 MED ORDER — IPRATROPIUM BROMIDE 0.02 % IN SOLN
0.5000 mg | Freq: Once | RESPIRATORY_TRACT | Status: AC
  Administered 2016-05-07: 0.5 mg via RESPIRATORY_TRACT
  Filled 2016-05-07: qty 2.5

## 2016-05-07 MED ORDER — ACETAMINOPHEN 650 MG RE SUPP: 650.0000 mg | Freq: Four times a day (QID) | RECTAL | Status: DC | PRN

## 2016-05-07 MED ORDER — ONDANSETRON HCL 4 MG PO TABS: 4.0000 mg | ORAL_TABLET | Freq: Four times a day (QID) | ORAL | Status: DC | PRN

## 2016-05-07 MED ORDER — METHYLPREDNISOLONE SODIUM SUCC 125 MG IJ SOLR
60.0000 mg | Freq: Three times a day (TID) | INTRAMUSCULAR | Status: DC
  Administered 2016-05-08: 60 mg via INTRAVENOUS
  Filled 2016-05-07: qty 2

## 2016-05-07 MED ORDER — INSULIN DETEMIR 100 UNIT/ML ~~LOC~~ SOLN
15.0000 [IU] | Freq: Every day | SUBCUTANEOUS | Status: DC
  Administered 2016-05-07: 15 [IU] via SUBCUTANEOUS
  Filled 2016-05-07 (×3): qty 0.15

## 2016-05-07 MED ORDER — ACETAMINOPHEN 325 MG PO TABS
650.0000 mg | ORAL_TABLET | Freq: Four times a day (QID) | ORAL | Status: DC | PRN
  Administered 2016-05-09: 650 mg via ORAL
  Filled 2016-05-07: qty 2

## 2016-05-07 MED ORDER — POLYETHYLENE GLYCOL 3350 17 G PO PACK: 17.0000 g | PACK | Freq: Every day | ORAL | Status: DC | PRN

## 2016-05-07 MED ORDER — CLOTRIMAZOLE 1 % EX CREA
1.0000 "application " | TOPICAL_CREAM | Freq: Two times a day (BID) | CUTANEOUS | Status: DC
  Administered 2016-05-07 – 2016-05-09 (×2): 1 via TOPICAL
  Filled 2016-05-07: qty 15

## 2016-05-07 MED ORDER — AZITHROMYCIN 250 MG PO TABS
500.0000 mg | ORAL_TABLET | Freq: Every day | ORAL | Status: AC
  Administered 2016-05-07: 500 mg via ORAL
  Filled 2016-05-07: qty 2

## 2016-05-07 MED ORDER — MAGNESIUM SULFATE 2 GM/50ML IV SOLN
2.0000 g | Freq: Once | INTRAVENOUS | Status: AC
  Administered 2016-05-07: 2 g via INTRAVENOUS
  Filled 2016-05-07: qty 50

## 2016-05-07 MED ORDER — IPRATROPIUM-ALBUTEROL 0.5-2.5 (3) MG/3ML IN SOLN
3.0000 mL | Freq: Four times a day (QID) | RESPIRATORY_TRACT | Status: DC
  Administered 2016-05-08 (×2): 3 mL via RESPIRATORY_TRACT
  Filled 2016-05-07 (×2): qty 3

## 2016-05-07 MED ORDER — DOCUSATE SODIUM 100 MG PO CAPS
100.0000 mg | ORAL_CAPSULE | Freq: Two times a day (BID) | ORAL | Status: DC
  Administered 2016-05-09: 100 mg via ORAL
  Filled 2016-05-07 (×4): qty 1

## 2016-05-07 MED ORDER — PANTOPRAZOLE SODIUM 40 MG PO TBEC
40.0000 mg | DELAYED_RELEASE_TABLET | Freq: Every day | ORAL | Status: DC
  Administered 2016-05-08 – 2016-05-09 (×2): 40 mg via ORAL
  Filled 2016-05-07 (×2): qty 1

## 2016-05-07 NOTE — ED Notes (Signed)
ivstarted after several attempts.  meds given.  Pt tolerated well.  No chest pain.  Pt is sob.  Oxygen in place.  Skin warm and dry.

## 2016-05-07 NOTE — H&P (Signed)
[facility] NAME: Kaitlin Henderson    MR#:  [id number]  DATE OF BIRTH:  07-26-1941  DATE OF ADMISSION:  05/07/2016  PRIMARY CARE PHYSICIAN: Gwendolyn Grant, MD   REQUESTING/REFERRING PHYSICIAN: Dr. Quentin Cornwall  CHIEF COMPLAINT:  Shortness of breath  HISTORY OF PRESENT ILLNESS:  Kaitlin Henderson  is a 75 y.o. female with a known history of COPD, chronic respiratory failure, lives on 5 L of oxygen, congestive heart failure, GERD, diabetes mellitus and multiple other medical problems is presenting to the ED with a chief complaint of worsening of shortness of breath. Patient was feeling tight in her chest and has received nebulizer treatments with no significant improvement. Patient was found to be tachycardic, hypoxemic. Patient was becoming very short of breath and having hard time to complete her sentences Chest x-ray with no acute abnormalities. Patient was living in [facility] and is feeling very weak and tired and thinks see Dr. Lemmie Evens might not be a good option for her. Patient was evaluated with Education officer, museum  PAST MEDICAL HISTORY:   Past Medical History:  Diagnosis Date  . Anginal pain ([place])   . Aphthous stomatitis   . APHTHOUS STOMATITIS   . Breast cancer, right breast ([place]) 1998   s/p chemo & XRT; right mastectomy  . Breast cancer, right breast ([place]) 1998   s/p chemo & XRT, right mastectomy  . C O P D    chronic O2 3LPM [place]  . CHF (congestive heart failure) ([place])   . COPD (chronic obstructive pulmonary disease) ([place])   . Coronary artery disease   . CORONARY ARTERY DISEASE   . Depression   . DEPRESSION    started sertraline 09/2010  . Diabetes mellitus, type 2 ([place]) 03/2010  . DIABETES, TYPE 2 dx 03/2010  . Essential tremor   . GERD (gastroesophageal reflux disease)   . Hyperlipemia   . HYPERLIPIDEMIA   . Hypothyroidism   . NSTEMI (non-ST elevated myocardial infarction) ([place]) 01/2012  .  NSTEMI (non-ST elevated myocardial infarction) ([place]) 01/2012   med mgmt  . Obesity   . OBESITY     PAST SURGICAL HISTOIRY:   Past Surgical History:  Procedure Laterality Date  . ABDOMINAL HYSTERECTOMY    . APPENDECTOMY    . CHOLECYSTECTOMY    . MASTECTOMY    . right mastectomy    . Right Mastectomy    . TONSILLECTOMY      SOCIAL HISTORY:   Social History  Substance Use Topics  . Smoking status: Former Smoker    Packs/day: 2.50    Years: 54.00    Types: Cigarettes    Quit date: 09/02/1997  . Smokeless tobacco: Never Used     Comment: Retired. Widow/widower since 2008-lives alone. Home hospice since 06/2009 related to COPD  . Alcohol use No    FAMILY HISTORY:   Family History  Problem Relation Age of Onset  . Diabetes Neg Hx   . Cancer Neg Hx     DRUG ALLERGIES:   Allergies  Allergen Reactions  . Codeine Other (See Comments)    Pt states that this medication makes her pass out.   . Levofloxacin Other (See Comments)    Reaction:  Redness   . Sulfa Antibiotics Swelling    REVIEW OF SYSTEMS:  CONSTITUTIONAL: No fever, Reporting fatigue and weakness.  EYES: No blurred or double vision.  EARS, NOSE, AND THROAT: No tinnitus or ear pain.  RESPIRATORY: Productive cough, reports exertional shortness of breath, chest tightness  CARDIOVASCULAR: No chest pain, orthopnea, edema.  GASTROINTESTINAL: No nausea, vomiting, diarrhea or abdominal pain.  GENITOURINARY: No dysuria, hematuria.  ENDOCRINE: No polyuria, nocturia,  HEMATOLOGY: No anemia, easy bruising or bleeding SKIN: No rash or lesion. MUSCULOSKELETAL: Difficulty with ambulation unable to get out of the bed for the past several weeks NEUROLOGIC: No tingling, numbness, weakness.  PSYCHIATRY: No anxiety or depression.   MEDICATIONS AT HOME:   Prior to Admission medications   Medication Sig Start Date End Date Taking? Authorizing Provider  acidophilus (RISAQUAD) CAPS capsule Take 1 capsule by mouth daily.     Historical Provider, MD  albuterol (PROVENTIL) (2.5 MG/3ML) 0.083% nebulizer solution Take 3 mLs (2.5 mg total) by nebulization every 6 (six) hours as needed for wheezing or shortness of breath. 11/09/15   Loletha Grayer, MD  ALPRAZolam Duanne Moron) 0.25 MG tablet Take 1 tablet (0.25 mg total) by mouth 3 (three) times daily as needed for anxiety. 02/23/16   Gladstone Lighter, MD  amoxicillin-clavulanate (AUGMENTIN) 875-125 MG tablet Take 1 tablet by mouth 2 (two) times daily. X 8 more days 02/23/16   Gladstone Lighter, MD  aspirin 325 MG tablet Take 325 mg by mouth at bedtime.     Historical Provider, MD  atorvastatin (LIPITOR) 40 MG tablet Take 40 mg by mouth at bedtime.     Historical Provider, MD  budesonide (PULMICORT) 0.5 MG/2ML nebulizer solution Take 0.5 mg by nebulization 2 (two) times daily.    Historical Provider, MD  clotrimazole (LOTRIMIN) 1 % cream Apply 1 application topically 2 (two) times daily. 12/14/15   Bettey Costa, MD  esomeprazole (NEXIUM) 40 MG capsule Take 40 mg by mouth daily at 12 noon.    Historical Provider, MD  guaiFENesin-dextromethorphan (ROBITUSSIN DM) 100-10 MG/5ML syrup Take 5 mLs by mouth every 4 (four) hours as needed for cough. 02/23/16   Gladstone Lighter, MD  insulin detemir (LEVEMIR) 100 UNIT/ML injection Inject 15 Units into the skin at bedtime.    Historical Provider, MD  ondansetron (ZOFRAN) 4 MG tablet Take 1 tablet (4 mg total) by mouth every 6 (six) hours as needed for nausea. 02/23/16   Gladstone Lighter, MD  polyethylene glycol (MIRALAX / GLYCOLAX) packet Take 17 g by mouth daily as needed for mild constipation.     Historical Provider, MD  predniSONE (STERAPRED UNI-PAK 21 TAB) 10 MG (21) TBPK tablet Take 1 tablet (10 mg total) by mouth daily. 6 tabs PO x 1 day 5 tabs PO x 1 day 4 tabs PO x 1 day 3 tabs PO x 1 day 2 tabs PO x 1 day 1 tab PO x 1 day and stop 02/23/16   Gladstone Lighter, MD  tiotropium (SPIRIVA) 18 MCG inhalation capsule Place 1 capsule (18 mcg  total) into inhaler and inhale daily. 11/09/15   Loletha Grayer, MD      VITAL SIGNS:  Blood pressure (!) 143/70, pulse (!) 107, temperature 98.1 F (36.7 C), height 5\' 3"  (1.6 m), weight 86.2 kg (190 lb), SpO2 95 %.  PHYSICAL EXAMINATION:  GENERAL:  75 y.o.-year-old patient lying in the bed with no acute distress.  EYES: Pupils equal, round, reactive to light and accommodation. No scleral icterus. Extraocular muscles intact.  HEENT: Head atraumatic, normocephalic. Oropharynx and nasopharynx clear.  NECK:  Supple, no jugular venous distention. No thyroid enlargement, no tenderness.  LUNGS:  diminished breath sounds bilaterally, diffuse wheezing,  no rales,rhonchi or crepitation. No use of accessory  muscles of respiration.  CARDIOVASCULAR: tachycardic. No murmurs, rubs, or gallops.  ABDOMEN: Soft, nontender, nondistended. Bowel sounds present. No organomegaly or mass.  EXTREMITIES: No pedal edema, cyanosis, or clubbing.  NEUROLOGIC: Cranial nerves II through XII are intact. Muscle strength 5/5 in all extremities. Sensation intact. Gait not checked.  PSYCHIATRIC: The patient is alert and oriented x 3.  SKIN: No obvious rash, lesion, or ulcer.   LABORATORY PANEL:   CBC  Recent Labs Lab 05/07/16 1522  WBC 12.8*  HGB 15.8  HCT 47.2*  PLT 259   ------------------------------------------------------------------------------------------------------------------  Chemistries   Recent Labs Lab 05/07/16 1522  NA 138  K 4.3  CL 98*  CO2 31  GLUCOSE 150*  BUN 24*  CREATININE 0.87  CALCIUM 9.1  MG 1.7  AST 20  ALT 10*  ALKPHOS 65  BILITOT 0.8   ------------------------------------------------------------------------------------------------------------------  Cardiac Enzymes  Recent Labs Lab 05/07/16 1522  TROPONINI <0.03   ------------------------------------------------------------------------------------------------------------------  RADIOLOGY:  Dg Chest 2  View  Result Date: 05/07/2016 CLINICAL DATA:  75 year old female with acute shortness of breath. Initial encounter. EXAM: CHEST  2 VIEW COMPARISON:  Chest CT 02/22/2016 and earlier. FINDINGS: Upright AP and lateral views of the chest. On the lateral views the patient arms are not raised. Spiculated right apical lung nodule visible by CT is not evident radiographically. Lower lung volumes compared to 02/19/2016. No residual right pleural effusion identified. A degree of chronic right lung volume loss appears stable. No pneumothorax or pulmonary edema. On the AP view the patient is more rotated to the right mediastinal contours appear stable. Osteopenia. No acute osseous abnormality identified. Stable postoperative changes to the right axilla. IMPRESSION: 1. Spiculated right apical lung nodule for which PET-CT had previously been recommended is not evident radiographically. 2. Small right pleural effusion seen in June appears resolved. 3. No new cardiopulmonary abnormality identified. Electronically Signed   By: Genevie Ann M.D.   On: 05/07/2016 16:11    EKG:   Orders placed or performed during the hospital encounter of 05/07/16  . ED EKG  . ED EKG  . EKG 12-Lead  . EKG 12-Lead    IMPRESSION AND PLAN:   Kaitlin Henderson  is a 75 y.o. female with a known history of COPD, chronic respiratory failure, lives on 5 L of oxygen, congestive heart failure, GERD, diabetes mellitus and multiple other medical problems is presenting to the ED with a chief complaint of worsening of shortness of breath. Patient was feeling tight in her chest and has received nebulizer treatments with no significant improvement. Patient was found to be tachycardic, hypoxemic. Chest x-ray with no acute abnormalities  #Acute on chronic hypoxic respiratory failure secondary to COPD exacerbation Admit the patient to MedSurg unit  Solu-Medrol IV, nebulizer treatments with duo nebs scheduled and albuterol as needed Azithromycin for  anti-inflammatory effect  #Diabetes mellitus Resume her home medication including long acting insulin and provide sliding scale insulin and diabetic diet  #Hyperlipidemia Continue statin which is her home medication  #Generalized weakness with ambulatory difficulties  PT consult is placed Patient will be benefited with skilled nursing care placement. Consult care management   All the records are reviewed and case discussed with ED provider. Management plans discussed with the patient, family and they are in agreement.  CODE STATUS: fc/ boyfriend  TOTAL TIME TAKING CARE OF THIS PATIENT: 45 minutes.   Note: This dictation was prepared with Dragon dictation along with smaller phrase technology. Any transcriptional errors that result from this  process are unintentional.  Nicholes Mango M.D on 05/07/2016 at 6:04 PM  Between 7am to 6pm - Pager - [phone]  After 6pm go to [email or web address] - password [email or web address] [facility]  Office  [phone]  CC: Primary care physician; Gwendolyn Grant, MD

## 2016-05-07 NOTE — Progress Notes (Signed)
Patient admitted to unit from Ed for SOB, patient alert and oriented , denies any pain at this time

## 2016-05-07 NOTE — ED Triage Notes (Signed)
Pt left skilled nursing facility yesterday and went to [facility] where she said she was in bed for 24 hours and started she was short of breath

## 2016-05-07 NOTE — ED Notes (Signed)
Med not in [facility] so called pharmacy for dose of Albuterol. Pharmacy only sent 2.5mg  rather than 5mg  so 2.5mg  given and new order placed for remaining 2.5mg  of albuterol

## 2016-05-07 NOTE — ED Notes (Signed)
1 attempt at IV -unsuccessful pt requested another nurse to try

## 2016-05-07 NOTE — ED Provider Notes (Signed)
[facility] Emergency Department Provider Note    First MD Initiated Contact with Patient 05/07/16 1510     (approximate)  I have reviewed the triage vital signs and the nursing notes.   HISTORY  Chief Complaint Shortness of Breath    HPI Kaitlin Henderson is a 75 y.o. female presents with chief complaint of severe shortness of breath and COPD exacerbation. Patient was recently discharged from skilled nursing facility to [facility] yesterday. States that shortness of rest developed last evening and she's not been able to ambulate due to weakness and shortness of breath. States that she has been laying in her bed for the past 24 hours and has slowed herself multiple times. Denies any chest pain or fevers. Denies any abdominal pain denies any dysuria. Past Medical History:  Diagnosis Date  . Anginal pain ([place])   . Aphthous stomatitis   . APHTHOUS STOMATITIS   . Breast cancer, right breast ([place]) 1998   s/p chemo & XRT; right mastectomy  . Breast cancer, right breast ([place]) 1998   s/p chemo & XRT, right mastectomy  . C O P D    chronic O2 3LPM [place]  . CHF (congestive heart failure) ([place])   . COPD (chronic obstructive pulmonary disease) ([place])   . Coronary artery disease   . CORONARY ARTERY DISEASE   . Depression   . DEPRESSION    started sertraline 09/2010  . Diabetes mellitus, type 2 ([place]) 03/2010  . DIABETES, TYPE 2 dx 03/2010  . Essential tremor   . GERD (gastroesophageal reflux disease)   . Hyperlipemia   . HYPERLIPIDEMIA   . Hypothyroidism   . NSTEMI (non-ST elevated myocardial infarction) ([place]) 01/2012  . NSTEMI (non-ST elevated myocardial infarction) ([place]) 01/2012   med mgmt  . Obesity   . OBESITY     Patient Active Problem List   Diagnosis Date Noted  . COPD with acute exacerbation ([place]) 05/07/2016  . Pressure ulcer 02/20/2016  . Cellulitis of leg, left 12/12/2015  . Cellulitis of right lower extremity 11/26/2015    . Lung nodule 11/25/2015  . Anxiety 11/25/2015  . GERD (gastroesophageal reflux disease) 11/25/2015  . Cellulitis of left lower extremity 11/25/2015  . Leukocytosis 11/16/2015  . Cellulitis 11/16/2015  . 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 COPD exacerbation ([place])   . Acute on chronic respiratory failure with hypoxia ([place])   . SOB (shortness of breath) 11/02/2015  . Non-compliant behavior 12/05/2014  . Decreased mobility 08/15/2014  . Benign essential tremor 09/05/2011  . Breast cancer, right breast ([place])   . DEPRESSION 09/25/2010  . HAIR LOSS 06/26/2010  . Type 2 diabetes mellitus with neurological complications ([place]) [id number]  . POLYURIA 03/13/2010  . APHTHOUS STOMATITIS 08/23/2009  . CHRONIC RESPIRATORY FAILURE 08/18/2007  . Hyperlipidemia 08/17/2007  . OBESITY 08/17/2007  . CORONARY ARTERY DISEASE 08/17/2007  . COPD with emphysema, [place]  08/17/2007    Past Surgical History:  Procedure Laterality Date  . ABDOMINAL HYSTERECTOMY    . APPENDECTOMY    . CHOLECYSTECTOMY    . MASTECTOMY    . right mastectomy    . Right Mastectomy    . TONSILLECTOMY      Prior to Admission medications   Medication Sig Start Date End Date Taking? Authorizing Provider  acidophilus (RISAQUAD) CAPS capsule Take 1 capsule by mouth daily.    Historical Provider, MD  albuterol (PROVENTIL) (2.5 MG/3ML) 0.083% nebulizer solution Take 3 mLs (2.5  mg total) by nebulization every 6 (six) hours as needed for wheezing or shortness of breath. 11/09/15   Loletha Grayer, MD  ALPRAZolam Duanne Moron) 0.25 MG tablet Take 1 tablet (0.25 mg total) by mouth 3 (three) times daily as needed for anxiety. 02/23/16   Gladstone Lighter, MD  amoxicillin-clavulanate (AUGMENTIN) 875-125 MG tablet Take 1 tablet by mouth 2 (two) times daily. X 8 more days 02/23/16   Gladstone Lighter, MD  aspirin 325 MG tablet Take 325 mg by mouth at bedtime.     Historical Provider, MD  atorvastatin (LIPITOR) 40 MG tablet Take 40 mg  by mouth at bedtime.     Historical Provider, MD  budesonide (PULMICORT) 0.5 MG/2ML nebulizer solution Take 0.5 mg by nebulization 2 (two) times daily.    Historical Provider, MD  clotrimazole (LOTRIMIN) 1 % cream Apply 1 application topically 2 (two) times daily. 12/14/15   Bettey Costa, MD  esomeprazole (NEXIUM) 40 MG capsule Take 40 mg by mouth daily at 12 noon.    Historical Provider, MD  guaiFENesin-dextromethorphan (ROBITUSSIN DM) 100-10 MG/5ML syrup Take 5 mLs by mouth every 4 (four) hours as needed for cough. 02/23/16   Gladstone Lighter, MD  insulin detemir (LEVEMIR) 100 UNIT/ML injection Inject 15 Units into the skin at bedtime.    Historical Provider, MD  ondansetron (ZOFRAN) 4 MG tablet Take 1 tablet (4 mg total) by mouth every 6 (six) hours as needed for nausea. 02/23/16   Gladstone Lighter, MD  polyethylene glycol (MIRALAX / GLYCOLAX) packet Take 17 g by mouth daily as needed for mild constipation.     Historical Provider, MD  predniSONE (STERAPRED UNI-PAK 21 TAB) 10 MG (21) TBPK tablet Take 1 tablet (10 mg total) by mouth daily. 6 tabs PO x 1 day 5 tabs PO x 1 day 4 tabs PO x 1 day 3 tabs PO x 1 day 2 tabs PO x 1 day 1 tab PO x 1 day and stop 02/23/16   Gladstone Lighter, MD  tiotropium (SPIRIVA) 18 MCG inhalation capsule Place 1 capsule (18 mcg total) into inhaler and inhale daily. 11/09/15   Loletha Grayer, MD    Allergies Codeine; Levofloxacin; and Sulfa antibiotics  Family History  Problem Relation Age of Onset  . Diabetes Neg Hx   . Cancer Neg Hx     Social History Social History  Substance Use Topics  . Smoking status: Former Smoker    Packs/day: 2.50    Years: 54.00    Types: Cigarettes    Quit date: 09/02/1997  . Smokeless tobacco: Never Used     Comment: Retired. Widow/widower since 2008-lives alone. Home hospice since 06/2009 related to COPD  . Alcohol use No    Review of Systems Patient denies headaches, rhinorrhea, blurry vision, numbness, shortness of  breath, chest pain, edema, cough, abdominal pain, nausea, vomiting, diarrhea, dysuria, fevers, rashes or hallucinations unless otherwise stated above in HPI. ____________________________________________   PHYSICAL EXAM:  VITAL SIGNS: Vitals:   05/07/16 1830 05/07/16 1913  BP: (!) 115/55 103/87  Pulse: (!) 114 (!) 109  Resp: (!) 22 12  Temp:  98.4 F (36.9 C)    Constitutional: Alert and oriented. Chronically ill appearing in mild respiratory distress Eyes: Conjunctivae are normal. PERRL. EOMI. Head: Atraumatic. Nose: No congestion/rhinnorhea. Mouth/Throat: Mucous membranes are moist.  Oropharynx non-erythematous. Neck: No stridor. Painless ROM. No cervical spine tenderness to palpation Hematological/Lymphatic/Immunilogical: No cervical lymphadenopathy. Cardiovascular: Normal rate, regular rhythm. Grossly normal heart sounds.  Good peripheral circulation. Respiratory: Normal  respiratory effort.  No retractions. Decreased breath sounds with prolonged expiratory phase Gastrointestinal: Soft and nontender. No distention. No abdominal bruits. No CVA tenderness. Genitourinary:  Musculoskeletal: No lower extremity tenderness nor edema.  No joint effusions. Neurologic:  Normal speech and language. No gross focal neurologic deficits are appreciated. No gait instability. Skin:  Skin is warm, dry and intact. No rash noted. Psychiatric: Mood and affect are normal. Speech and behavior are normal.  ____________________________________________   LABS (all labs ordered are listed, but only abnormal results are displayed)  Results for orders placed or performed during the hospital encounter of 05/07/16 (from the past 24 hour(s))  CBC with Differential/Platelet     Status: Abnormal   Collection Time: 05/07/16  3:22 PM  Result Value Ref Range   WBC 12.8 (H) 3.6 - 11.0 K/uL   RBC 4.98 3.80 - 5.20 MIL/uL   Hemoglobin 15.8 12.0 - 16.0 g/dL   HCT 47.2 (H) 35.0 - 47.0 %   MCV 94.8 80.0 - 100.0 fL    MCH 31.7 26.0 - 34.0 pg   MCHC 33.4 32.0 - 36.0 g/dL   RDW 13.8 11.5 - 14.5 %   Platelets 259 150 - 440 K/uL   Neutrophils Relative % 72 %   Neutro Abs 9.2 (H) 1.4 - 6.5 K/uL   Lymphocytes Relative 20 %   Lymphs Abs 2.5 1.0 - 3.6 K/uL   Monocytes Relative 7 %   Monocytes Absolute 0.9 0.2 - 0.9 K/uL   Eosinophils Relative 2 %   Eosinophils Absolute 0.2 0 - 0.7 K/uL   Basophils Relative 1 %   Basophils Absolute 0.1 0 - 0.1 K/uL  Comprehensive metabolic panel     Status: Abnormal   Collection Time: 05/07/16  3:22 PM  Result Value Ref Range   Sodium 138 135 - 145 mmol/L   Potassium 4.3 3.5 - 5.1 mmol/L   Chloride 98 (L) 101 - 111 mmol/L   CO2 31 22 - 32 mmol/L   Glucose, Bld 150 (H) 65 - 99 mg/dL   BUN 24 (H) 6 - 20 mg/dL   Creatinine, Ser 0.87 0.44 - 1.00 mg/dL   Calcium 9.1 8.9 - 10.3 mg/dL   Total Protein 7.2 6.5 - 8.1 g/dL   Albumin 3.7 3.5 - 5.0 g/dL   AST 20 15 - 41 U/L   ALT 10 (L) 14 - 54 U/L   Alkaline Phosphatase 65 38 - 126 U/L   Total Bilirubin 0.8 0.3 - 1.2 mg/dL   GFR calc non Af Amer >60 >60 mL/min   GFR calc Af Amer >60 >60 mL/min   Anion gap 9 5 - 15  Troponin I     Status: None   Collection Time: 05/07/16  3:22 PM  Result Value Ref Range   Troponin I <0.03 <0.03 ng/mL  Magnesium     Status: None   Collection Time: 05/07/16  3:22 PM  Result Value Ref Range   Magnesium 1.7 1.7 - 2.4 mg/dL  CK     Status: None   Collection Time: 05/07/16  3:22 PM  Result Value Ref Range   Total CK 54 38 - 234 U/L  Glucose, capillary     Status: Abnormal   Collection Time: 05/07/16  8:40 PM  Result Value Ref Range   Glucose-Capillary 219 (H) 65 - 99 mg/dL   ____________________________________________  EKG My review and personal interpretation at Time: 15:25   Indication: sob  Rate: 95  Rhythm: nsr Axis: right ward  Other: nsr no acute ischemic changes. ____________________________________________  RADIOLOGY  CXR my read shows no evidence of acute  cardiopulmonary process. ____________________________________________   PROCEDURES  Procedure(s) performed: none    Critical Care performed: no ____________________________________________   INITIAL IMPRESSION / ASSESSMENT AND PLAN / ED COURSE  Pertinent labs & imaging results that were available during my care of the patient were reviewed by me and considered in my medical decision making (see chart for details).  DDX: Asthma, copd, CHF, pna, ptx, malignancy, Pe, anemia   Kaitlin Henderson is a 75 y.o. who presents to the ED with Inability to ambulate and worsening shortness of breath after being left in her bed for 24 hours at assisted living facility. Patient arrives afebrile but is tachypneic And tachycardic. No evidence of chest pain. Based on exam I am more suspicious of COPD exacerbation. Chest x-ray without significant heart failure but she does have cardiomegaly therefore will limit IV fluid resuscitation given her tachycardia.  The patient will be placed on continuous pulse oximetry and telemetry for monitoring.  Laboratory evaluation will be sent to evaluate for the above complaints.  Given her history of COPD and based on her mild respiratory distress we'll give IV steroids as well as magnesium and inhalers.   Clinical Course  Comment By Time  Spoke with social work regarding patient's case. Patient not agreeable to returning back to see [facility]. Patient unable to ambulate due to severe shortness of breath dyspnea. Based on these findings patient will require admission for further evaluation and management. Merlyn Lot, MD 09/05 1722     ____________________________________________   FINAL CLINICAL IMPRESSION(S) / ED DIAGNOSES  Final diagnoses:  COPD exacerbation ([place])  Tachypnea  Tachycardia      NEW MEDICATIONS STARTED DURING THIS VISIT:  Current Discharge Medication List       Note:  This document was prepared using Dragon voice recognition  software and may include unintentional dictation errors.    Merlyn Lot, MD 05/07/16 [phone]

## 2016-05-07 NOTE — Progress Notes (Addendum)
Pt requested that CSW call her boyfriend Randall Hiss [phone] to let him know that she is in the hospital. No answer, CSW left a message.  Update: CSW received call back from [facility] [phone]. CSW informed him that pt is in the ED and it is unclear at this time if she will be admitted or not. Randall Hiss expressed his understanding and thanked CSW for the update.  Georga Kaufmann, MSW, [place]

## 2016-05-07 NOTE — Clinical Social Work Note (Signed)
Clinical Social Work Assessment  Patient Details  Name: Kaitlin Henderson MRN: [id number] Date of Birth: 10-15-1940  Date of referral:  05/07/16               Reason for consult:  Abuse/Neglect, Discharge Planning                Permission sought to share information with:  Other, Customer service manager (Significant Other) Permission granted to share information::     Name::     Dickie La (significant other) [phone]  Agency::     Relationship::     Contact Information:     Housing/Transportation Living arrangements for the past 2 months:  [place], [place] of Information:  Patient Patient Interpreter Needed:  None Criminal Activity/Legal Involvement Pertinent to Current Situation/Hospitalization:  No - Comment as needed Significant Relationships:  Adult Children, Significant Other, Community Support Lives with:  Self Do you feel safe going back to the place where you live?  No Need for family participation in patient care:  No (Coment)  Care giving concerns: Pt does not feel as if she can live independently at this time.   Social Worker assessment / plan:  CSW received consult for possible placement and neglect concerns from the former facility. CSW engaged with pt at pt's bedside. CSW introduced herself and her role as a Education officer, museum. Pt expressed a strong desire for her children NOT to be contacted about this hospital admission. Pt explains that she was at a [place] yesterday and was discharged to [facility] today. Pt reports that shortly after arriving at [facility] she started to feel unwell. She was unable to get out of bed and pt reports that "no one came to check on me". Pt explained that there was a worker that came to bring her lunch and she was able to tell that worker that she wasn't feeling well and was then brought to the hospital. Per RN, pt will likely be admitted to the hospital. Pt reports that  she does not wish to return to [facility] upon d/c but would like to return to SNF level of care if possible.  CSW intially intended to file a [facility] complaint against the facility. However, CSW called [facility] at [phone] and was informed that [facility] is an independent living community. Pt was also having her meals delivered to the apartment so there were at least 2 people she came in contact with that she could have communicated with about her needs. Pt does report that she communicated her needs to the lunch staff. Because [facility] is an independent living community and because there were two staff members that did in fact check on the pt, CSW does not feel a [facility] complaint is warranted at this time. CSW signing off as there are no other social work needs at this time. However, pt will be followed by a Medical CSW once admitted.  Employment status:  Retired Forensic scientist:  Medicare PT Recommendations:  Not assessed at this time [place] / Referral to community resources:  Other (Comment Required), [facility] complaint)  Patient/Family's Response to care: Pt does not wish to d/c back to [facility]. Pt interested in SNF placement if she meets criteria. If not, ALF would likely be a good option as well.  Patient/Family's Understanding of and Emotional Response to Diagnosis, Current Treatment, and Prognosis: Pt is appreciative of the assistance provided by CSW at this  time.  Emotional Assessment Appearance:  Appears stated age Attitude/Demeanor/Rapport:  Other (Cooperative) Affect (typically observed):  Tearful/Crying, Pleasant Orientation:  Oriented to Self, Oriented to Place, Oriented to  Time, Oriented to Situation Alcohol / Substance use:  Not Applicable Psych involvement (Current and /or in the community):  No (Comment)  Discharge Needs  Concerns to be addressed:  Care Coordination Readmission within the last 30 days:  Yes Current discharge  risk:  Dependent with Mobility Barriers to Discharge:  Continued Medical Work up   [employer], [place] 05/07/2016, 4:39 PM

## 2016-05-08 LAB — CBC
HEMATOCRIT: 41.6 % (ref 35.0–47.0)
HEMOGLOBIN: 14.2 g/dL (ref 12.0–16.0)
MCH: 32.3 pg (ref 26.0–34.0)
MCHC: 34 g/dL (ref 32.0–36.0)
MCV: 95 fL (ref 80.0–100.0)
Platelets: 225 10*3/uL (ref 150–440)
RBC: 4.38 MIL/uL (ref 3.80–5.20)
RDW: 13.9 % (ref 11.5–14.5)
WBC: 7 10*3/uL (ref 3.6–11.0)

## 2016-05-08 LAB — URINALYSIS COMPLETE WITH MICROSCOPIC (ARMC ONLY)
BILIRUBIN URINE: NEGATIVE
GLUCOSE, UA: 150 mg/dL — AB
Hgb urine dipstick: NEGATIVE
KETONES UR: NEGATIVE mg/dL
Leukocytes, UA: NEGATIVE
NITRITE: NEGATIVE
Protein, ur: NEGATIVE mg/dL
SPECIFIC GRAVITY, URINE: 1.006 (ref 1.005–1.030)
pH: 6 (ref 5.0–8.0)

## 2016-05-08 LAB — BASIC METABOLIC PANEL
ANION GAP: 9 (ref 5–15)
BUN: 21 mg/dL — ABNORMAL HIGH (ref 6–20)
CHLORIDE: 98 mmol/L — AB (ref 101–111)
CO2: 31 mmol/L (ref 22–32)
Calcium: 8.4 mg/dL — ABNORMAL LOW (ref 8.9–10.3)
Creatinine, Ser: 0.8 mg/dL (ref 0.44–1.00)
GFR calc Af Amer: >60 mL/min (ref >60)
Glucose, Bld: 280 mg/dL — ABNORMAL HIGH (ref 65–99)
POTASSIUM: 4.2 mmol/L (ref 3.5–5.1)
SODIUM: 138 mmol/L (ref 135–145)

## 2016-05-08 LAB — GLUCOSE, CAPILLARY
GLUCOSE-CAPILLARY: 222 mg/dL — AB (ref 65–99)
Glucose-Capillary: 356 mg/dL — ABNORMAL HIGH (ref 65–99)
Glucose-Capillary: 390 mg/dL — ABNORMAL HIGH (ref 65–99)
Glucose-Capillary: 211 mg/dL — ABNORMAL HIGH (ref 65–99)

## 2016-05-08 LAB — HEMOGLOBIN A1C: Hgb A1c MFr Bld: 6.2 % — ABNORMAL HIGH (ref 4.0–6.0)

## 2016-05-08 MED ORDER — INSULIN DETEMIR 100 UNIT/ML ~~LOC~~ SOLN
25.0000 [IU] | Freq: Every day | SUBCUTANEOUS | Status: DC
Start: 2016-05-08 — End: 2016-05-09
  Administered 2016-05-08: 25 [IU] via SUBCUTANEOUS
  Filled 2016-05-08 (×2): qty 0.25

## 2016-05-08 MED ORDER — ALBUTEROL SULFATE (2.5 MG/3ML) 0.083% IN NEBU
2.5000 mg | INHALATION_SOLUTION | Freq: Four times a day (QID) | RESPIRATORY_TRACT | Status: DC
  Administered 2016-05-08 – 2016-05-09 (×3): 2.5 mg via RESPIRATORY_TRACT
  Filled 2016-05-08 (×4): qty 3

## 2016-05-08 MED ORDER — METHYLPREDNISOLONE SODIUM SUCC 125 MG IJ SOLR
60.0000 mg | Freq: Two times a day (BID) | INTRAMUSCULAR | Status: DC
  Administered 2016-05-08 – 2016-05-09 (×2): 60 mg via INTRAVENOUS
  Filled 2016-05-08 (×2): qty 2

## 2016-05-08 MED ORDER — IPRATROPIUM-ALBUTEROL 0.5-2.5 (3) MG/3ML IN SOLN: 3.0000 mL | RESPIRATORY_TRACT | Status: DC | PRN

## 2016-05-08 NOTE — Progress Notes (Signed)
Initial Nutrition Assessment  DOCUMENTATION CODES:   Obesity unspecified  INTERVENTION:  -Encourage PO intake   NUTRITION DIAGNOSIS:   Inadequate oral intake related to poor appetite as evidenced by per patient/family report.  GOAL:   Patient will meet greater than or equal to 90% of their needs  MONITOR:   PO intake, I & O's, Labs, Weight trends  REASON FOR ASSESSMENT:   Malnutrition Screening Tool    ASSESSMENT:   Kaitlin Henderson  is a 75 y.o. female with a known history of COPD, chronic respiratory failure, lives on 5 L of oxygen, congestive heart failure, GERD, diabetes mellitus and multiple other medical problems is presenting to the ED with a chief complaint of worsening of shortness of breath  Spoke with [name] at bedside. She endorses poor appetite PTA, "hadn't eaten anything for 2 days." Pt states she didn't feel good and therefore didn't eat anything. She claims her normal weight to be "Well into the 200s." Pt denied that this was fluid related. Per chart review pt has been around 193#-197# for the past 6 months. She currently exhibits a 3#/1.5% insignificant wt loss over 3 months. Documented PO at breakfast this morning 100% pt states she at everything except her grits. "I'm eating everything that doesn't eat me first." Doesn't seem to have an issue with appetite at this time - possibly related to solumedrol. Labs and Medications reviewed: Solumedrol, Colace CBG 356  Diet Order:  Diet Carb Modified Fluid consistency: Thin; Room service appropriate? Yes  Skin:  Reviewed, no issues  Last BM:  05/07/2016  Height:   Ht Readings from Last 1 Encounters:  05/07/16 5\' 3"  (1.6 m)    Weight:   Wt Readings from Last 1 Encounters:  05/07/16 194 lb 6.4 oz (88.2 kg)    Ideal Body Weight:  52.27 kg  BMI:  Body mass index is 34.44 kg/m.  Estimated Nutritional Needs:   Kcal:  1500-1800 calories (25-30 cal/kg ABW)  Protein:  80-100 gm  Fluid:  >/=  1.5L  EDUCATION NEEDS:   No education needs identified at this time  [name]. [name], MS, RD LDN Inpatient Clinical Dietitian Pager [phone]

## 2016-05-08 NOTE — NC FL2 (Signed)
[place] LEVEL OF CARE SCREENING TOOL     IDENTIFICATION  Patient Name: Kaitlin Henderson Birthdate: 01-20-1941 Sex: female Admission Date (Current Location): 05/07/2016  [place] and [place] Number:  Engineering geologist and Address:  [facility], [address] [place], [place] [address]      Provider Number: [id number]  Attending Physician Name and Address:  Fritzi Mandes, MD  Relative Name and Phone Number:       Current Level of Care: Hospital Recommended Level of Care: [place] Prior Approval Number:    Date Approved/Denied:   PASRR Number:  ( [id number] )  Discharge Plan: SNF    Current Diagnoses: Patient Active Problem List   Diagnosis Date Noted  . COPD with acute exacerbation ([place]) 05/07/2016  . Pressure ulcer 02/20/2016  . Cellulitis of leg, left 12/12/2015  . Cellulitis of right lower extremity 11/26/2015  . Lung nodule 11/25/2015  . Anxiety 11/25/2015  . GERD (gastroesophageal reflux disease) 11/25/2015  . Cellulitis of left lower extremity 11/25/2015  . Leukocytosis 11/16/2015  . Cellulitis 11/16/2015  . 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 COPD exacerbation ([place])   . Acute on chronic respiratory failure with hypoxia ([place])   . SOB (shortness of breath) 11/02/2015  . Non-compliant behavior 12/05/2014  . Decreased mobility 08/15/2014  . Benign essential tremor 09/05/2011  . Breast cancer, right breast ([place])   . DEPRESSION 09/25/2010  . HAIR LOSS 06/26/2010  . Type 2 diabetes mellitus with neurological complications ([place]) [id number]  . POLYURIA 03/13/2010  . APHTHOUS STOMATITIS 08/23/2009  . CHRONIC RESPIRATORY FAILURE 08/18/2007  . Hyperlipidemia 08/17/2007  . OBESITY 08/17/2007  . CORONARY ARTERY DISEASE 08/17/2007  . COPD with emphysema, Gold D  08/17/2007    Orientation RESPIRATION BLADDER Height & Weight     Self, Time, Situation, Place  O2 (5 Liters at baseline )  Incontinent Weight: 194 lb 6.4 oz (88.2 kg) Height:  5\' 3"  (160 cm)  BEHAVIORAL SYMPTOMS/MOOD NEUROLOGICAL BOWEL NUTRITION STATUS   (none )  (none) Continent Diet (Diet: Carb Modified )  AMBULATORY STATUS COMMUNICATION OF NEEDS Skin   Extensive Assist Verbally Normal                       Personal Care Assistance Level of Assistance  Bathing, Feeding, Dressing Bathing Assistance: Maximum assistance Feeding assistance: Independent Dressing Assistance: Maximum assistance     Functional Limitations Info  Sight, Hearing, Speech Sight Info: Adequate Hearing Info: Adequate Speech Info: Adequate    SPECIAL CARE FACTORS FREQUENCY  PT (By licensed PT)     PT Frequency:  (5)              Contractures      Additional Factors Info  Code Status, Allergies, Insulin Sliding Scale Code Status Info:  (Full Code. ) Allergies Info:  (Codeine, Levofloxacin, Sulfa Antibiotics)   Insulin Sliding Scale Info:  (NovoLog Insulin )       Current Medications (05/08/2016):  This is the current hospital active medication list Current Facility-Administered Medications  Medication Dose Route Frequency Provider Last Rate Last Dose  . acetaminophen (TYLENOL) tablet 650 mg  650 mg Oral Q6H PRN Nicholes Mango, MD       Or  . acetaminophen (TYLENOL) suppository 650 mg  650 mg Rectal Q6H PRN Nicholes Mango, MD      . acidophilus (RISAQUAD) capsule 1 capsule  1 capsule Oral Daily Nicholes Mango, MD  1 capsule at 05/08/16 0823  . albuterol (PROVENTIL) (2.5 MG/3ML) 0.083% nebulizer solution 2.5 mg  2.5 mg Nebulization Q4H PRN Nicholes Mango, MD      . ALPRAZolam Duanne Moron) tablet 0.25 mg  0.25 mg Oral TID PRN Nicholes Mango, MD      . aspirin tablet 325 mg  325 mg Oral QHS Nicholes Mango, MD   325 mg at 05/07/16 2216  . atorvastatin (LIPITOR) tablet 40 mg  40 mg Oral QHS Nicholes Mango, MD   40 mg at 05/07/16 2216  . azithromycin (ZITHROMAX) tablet 250 mg  250 mg Oral Daily Nicholes Mango, MD   250 mg at 05/08/16 0823  .  clotrimazole (LOTRIMIN) 1 % cream 1 application  1 application Topical BID Nicholes Mango, MD   1 application at [id number]  . docusate sodium (COLACE) capsule 100 mg  100 mg Oral BID Nicholes Mango, MD      . enoxaparin (LOVENOX) injection 40 mg  40 mg Subcutaneous Q24H Aruna Gouru, MD      . guaiFENesin-dextromethorphan (ROBITUSSIN DM) 100-10 MG/5ML syrup 5 mL  5 mL Oral Q4H PRN Aruna Gouru, MD      . insulin aspart (novoLOG) injection 0-20 Units  0-20 Units Subcutaneous TID WC Nicholes Mango, MD   20 Units at 05/08/16 [id number]  . insulin aspart (novoLOG) injection 0-5 Units  0-5 Units Subcutaneous QHS Nicholes Mango, MD   2 Units at 05/07/16 2211  . insulin detemir (LEVEMIR) injection 25 Units  25 Units Subcutaneous QHS Fritzi Mandes, MD      . ipratropium-albuterol (DUONEB) 0.5-2.5 (3) MG/3ML nebulizer solution 3 mL  3 mL Nebulization Q6H Nicholes Mango, MD   3 mL at 05/08/16 0747  . methylPREDNISolone sodium succinate (SOLU-MEDROL) 125 mg/2 mL injection 60 mg  60 mg Intravenous Q12H Fritzi Mandes, MD      . ondansetron (ZOFRAN) tablet 4 mg  4 mg Oral Q6H PRN Nicholes Mango, MD       Or  . ondansetron (ZOFRAN) injection 4 mg  4 mg Intravenous Q6H PRN Nicholes Mango, MD      . pantoprazole (PROTONIX) EC tablet 40 mg  40 mg Oral Daily Nicholes Mango, MD   40 mg at 05/08/16 [id number]  . polyethylene glycol (MIRALAX / GLYCOLAX) packet 17 g  17 g Oral Daily PRN Nicholes Mango, MD      . tiotropium (SPIRIVA) inhalation capsule 18 mcg  18 mcg Inhalation Daily Nicholes Mango, MD   18 mcg at 05/08/16 [id number]     Discharge Medications: Please see discharge summary for a list of discharge medications.  Relevant Imaging Results:  Relevant Lab Results:   Additional Information  (SSN: [id number])  Angelia Hazell, Veronia Beets, LCSW

## 2016-05-08 NOTE — Progress Notes (Signed)
MD Sainani made aware that pt had runs of V-Tach, Pt is asymptomatic, no new orders received.

## 2016-05-08 NOTE — Progress Notes (Signed)
PT Cancellation Note  Patient Details Name: SVETLANA ILLA MRN: [id number] DOB: 12-16-1940   Cancelled Treatment:    Reason Eval/Treat Not Completed: Patient declined, no reason specified.  PT consult received.  Chart reviewed.  Pt teary and reporting having a "bad day" and did not want to participate in PT today.  Will re-attempt PT eval at a later date/time.   Raquel Sarna Meliana Canner 05/08/2016, 2:54 PM Leitha Bleak, [place]

## 2016-05-08 NOTE — Progress Notes (Signed)
Inpatient Diabetes Program Recommendations  AACE/ADA: New Consensus Statement on Inpatient Glycemic Control (2015)  Target Ranges:  Prepandial:   less than 140 mg/dL      Peak postprandial:   less than 180 mg/dL (1-2 hours)      Critically ill patients:  140 - 180 mg/dL   Results for Kaitlin Henderson, Kaitlin Henderson (MRN [id number]) as of 05/08/2016 07:48  Ref. Range 05/07/2016 20:40 05/08/2016 07:20  Glucose-Capillary Latest Ref Range: 65 - 99 mg/dL 219 (H) 356 (H)   Review of Glycemic Control  Diabetes history: DM2 Outpatient Diabetes medications: Levemir 15 units QHS Current orders for Inpatient glycemic control: Levemir 15 units QHS, Novolog 0-20 units TID with meals, Novolog 0-5 units QHS  Inpatient Diabetes Program Recommendations: Insulin - Basal: If steroids are continued, please consider increasing Levemir to 22 units QHS (based on 88 kg x 0.25 units). Insulin - Meal Coverage: If steroids are continued, please consider ordering Novolog 4 units TID with meals for meal coverage if patient eats at least 50% of meal. HgbA1C: A1C in process.  Thanks, Barnie Alderman, RN, MSN, CDE Diabetes Coordinator Inpatient Diabetes Program [phone] (Team Pager from [place] to [place]) [phone] (AP office) [phone] [facility] office) [phone] [facility] office)

## 2016-05-08 NOTE — Progress Notes (Signed)
Alert and oriented. Vitals are stable. CBG's have been elevated, but discovered patient drank 6-7 apple juices early today. Unaware of how patient got the juices other than on her breakfast tray. Instructed patient that she can no longer have drinks with sugar right now until her CBG comes down some, patient understood. Patient has been very tearful all day about what facility and where she will go at discharge. Breathing status is back to baseline per patient. No complaints of pain.

## 2016-05-08 NOTE — Progress Notes (Signed)
Patient ID: Kaitlin Henderson, female   DOB: 08/23/1941, 75 y.o.   MRN: [id number] [place] [facility] [place] NAME: Kaitlin Henderson    MR#:  [id number]  DATE OF BIRTH:  1941-04-28  SUBJECTIVE:  Tearful this am. Feeling lonely Breathing improving. No wheezing  REVIEW OF SYSTEMS:   Review of Systems  Constitutional: Negative for chills, fever and weight loss.  HENT: Negative for ear discharge, ear pain and nosebleeds.   Eyes: Negative for blurred vision, pain and discharge.  Respiratory: Positive for sputum production and shortness of breath. Negative for wheezing and stridor.   Cardiovascular: Negative for chest pain, palpitations, orthopnea and PND.  Gastrointestinal: Negative for abdominal pain, diarrhea, nausea and vomiting.  Genitourinary: Negative for frequency and urgency.  Musculoskeletal: Negative for back pain and joint pain.  Neurological: Negative for sensory change, speech change, focal weakness and weakness.  Psychiatric/Behavioral: Negative for depression and hallucinations. The patient is not nervous/anxious.    Tolerating Diet:yes Tolerating PT: does not walk much at baseline.Has motorized WC  DRUG ALLERGIES:   Allergies  Allergen Reactions  . Codeine Other (See Comments)    Pt states that this medication makes her pass out.   . Levofloxacin Other (See Comments)    Reaction:  Redness   . Sulfa Antibiotics Swelling    VITALS:  Blood pressure (!) 116/57, pulse 99, temperature 98.1 F (36.7 C), temperature source Oral, resp. rate 18, height 5\' 3"  (1.6 m), weight 88.2 kg (194 lb 6.4 oz), SpO2 92 %.  PHYSICAL EXAMINATION:   Physical Exam  GENERAL:  75 y.o.-year-old patient lying in the bed with no acute distress.  EYES: Pupils equal, round, reactive to light and accommodation. No scleral icterus. Extraocular muscles intact.  HEENT: Head atraumatic, normocephalic. Oropharynx and nasopharynx clear.  NECK:  Supple, no  jugular venous distention. No thyroid enlargement, no tenderness.  LUNGS: distant breath sounds bilaterally, no wheezing, rales, rhonchi. No use of accessory muscles of respiration.  CARDIOVASCULAR: S1, S2 normal. No murmurs, rubs, or gallops.  ABDOMEN: Soft, nontender, nondistended. Bowel sounds present. No organomegaly or mass.  EXTREMITIES: No cyanosis, clubbing  -chronic edema b/l.    NEUROLOGIC: Cranial nerves II through XII are intact. No focal Motor or sensory deficits b/l.   PSYCHIATRIC:  patient is alert and oriented x 3.  SKIN: No obvious rash, lesion, or ulcer.   LABORATORY PANEL:  CBC  Recent Labs Lab 05/08/16 0331  WBC 7.0  HGB 14.2  HCT 41.6  PLT 225    Chemistries   Recent Labs Lab 05/07/16 1522 05/08/16 0331  NA 138 138  K 4.3 4.2  CL 98* 98*  CO2 31 31  GLUCOSE 150* 280*  BUN 24* 21*  CREATININE 0.87 0.80  CALCIUM 9.1 8.4*  MG 1.7  --   AST 20  --   ALT 10*  --   ALKPHOS 65  --   BILITOT 0.8  --    Cardiac Enzymes  Recent Labs Lab 05/07/16 1522  TROPONINI <0.03   RADIOLOGY:  Dg Chest 2 View  Result Date: 05/07/2016 CLINICAL DATA:  75 year old female with acute shortness of breath. Initial encounter. EXAM: CHEST  2 VIEW COMPARISON:  Chest CT 02/22/2016 and earlier. FINDINGS: Upright AP and lateral views of the chest. On the lateral views the patient arms are not raised. Spiculated right apical lung nodule visible by CT is not evident radiographically. Lower lung volumes compared to 02/19/2016. No residual right  pleural effusion identified. A degree of chronic right lung volume loss appears stable. No pneumothorax or pulmonary edema. On the AP view the patient is more rotated to the right mediastinal contours appear stable. Osteopenia. No acute osseous abnormality identified. Stable postoperative changes to the right axilla. IMPRESSION: 1. Spiculated right apical lung nodule for which PET-CT had previously been recommended is not evident  radiographically. 2. Small right pleural effusion seen in June appears resolved. 3. No new cardiopulmonary abnormality identified. Electronically Signed   By: Genevie Ann M.D.   On: 05/07/2016 16:11   ASSESSMENT AND PLAN:  Kaitlin Henderson  is a 75 y.o. female with a known history of COPD, chronic respiratory failure, lives on 5 L of oxygen, congestive heart failure, GERD, diabetes mellitus and multiple other medical problems is presenting to the ED with a chief complaint of worsening of shortness of breath. Patient was feeling tight in her chest and has received nebulizer treatments with no significant improvement. Patient was found to be tachycardic, hypoxemic. Chest x-ray with no acute abnormalities  #Acute on chronic hypoxic respiratory failure secondary to COPD exacerbation  Solu-Medrol IV, nebulizer treatments with duo nebs scheduled and albuterol as needed Empiric Azithromycin  -cxr no PNA  #Diabetes mellitus Resume her home medication including long acting insulin and provide sliding scale insulin and diabetic diet -adjust insulin dosing due to steroid effect  #Hyperlipidemia Continue statin   #Generalized weakness with ambulatory difficulties -pt is from [place] and uses motorized [facility]  #PT consult is placed-not sure how much she will be able to participate  CSW consult for d/c planning    Case discussed with Care Management/Social Worker. Management plans discussed with the patient, family and they are in agreement.  CODE STATUS: full  DVT Prophylaxis: lovenox  TOTAL TIME TAKING CARE OF THIS PATIENT: 30 minutes.  >50% time spent on counselling and coordination of care with pt and RN  POSSIBLE D/C IN 1-2 DAYS, DEPENDING ON CLINICAL CONDITION.  Note: This dictation was prepared with Dragon dictation along with smaller phrase technology. Any transcriptional errors that result from this process are unintentional.  Kindle Strohmeier M.D on 05/08/2016 at 8:09 AM  Between 7am  to 6pm - Pager - [phone]  After 6pm go to [email or web address] - password [email or web address] [facility]  [place] [facility]  Office  [phone]  CC: Primary care physician; No PCP Per Patient

## 2016-05-08 NOTE — Progress Notes (Addendum)
Clinical Education officer, museum (CSW) presented bed offers to patient. Patient chose [facility]. CSW sent [facility] admissions coordinator at [facility] a message making him aware of accepted bed offer. CSW will continue to follow and assist as needed.   Per [facility] admissions coordinator at [facility] they will accept patient tomorrow.   [employer], LCSW [phone]

## 2016-05-08 NOTE — Care Management (Signed)
Recently discharged from SNF and sent to [facility] where she does not want to return. Interested in SNF. CSW and PT eval pending. Admitted with COPD exacerbation. On 5L chronically. Will assist as needed.

## 2016-05-08 NOTE — Progress Notes (Signed)
Checked SNF days used by [facility] [phone]).  Per their automated system, their records show that the patient has 0 full SNF days and 61 co-SNF days available. Patient has used a total of 39 SNF days as of 04/01/16, the last billing date on file.

## 2016-05-08 NOTE — Progress Notes (Signed)
Clinical Education officer, museum (CSW) received SNF consult. CSW is familiar with patient from previous admissions. Patient has been placed at [facility] twice and [facility] on 02/23/16 to 05/07/16. Patient was suppose to transition to long term car at [facility] and [facility] however patient left and returned to [facility]. CSW spoke with Santiago Glad admissions Mudlogger at [facility] who reported that they were going to keep patient for long term care however she refused to give up her social security check. Per Santiago Glad she would consider taking patient back if patient would agree to sign over her check. Patient has used around 55 SNF days under Medicare and has around 25 SNF days left.   CSW met with patient and made her aware of above. Patient reported that she is concerned that she won't have enough money to get her "hair done" if she gives over her check. CSW discussed the benefits of SNF level care and the fact that patient has been struggling at [facility]. Patient agreed to give up her check and return to SNF.   [employer] liaison is aware of above and will meet with patient today. Per Juliann Pulse patient does not require another 3 night inpatient stay in order to return to [facility]. Per Juliann Pulse patient cannot return to [facility] unless she has a Insurance underwriter submitted because she owes money to [facility]. CSW attempted to call the financial counselor and Vance Gather to assist with Medicaid application however they did not answer and a voicemail was left. CSW will continue to follow and assist as needed.   [employer], LCSW [phone]

## 2016-05-08 NOTE — Clinical Social Work Placement (Signed)
   CLINICAL SOCIAL WORK PLACEMENT  NOTE  Date:  05/08/2016  Patient Details  Name: Kaitlin Henderson MRN: [id number] Date of Birth: [birth year]  Clinical Social Work is seeking post-discharge placement for this patient at the [place] level of care (*CSW will initial, date and re-position this form in  chart as items are completed):  Yes   Patient/family provided with [place] Work Department's list of facilities offering this level of care within the geographic area requested by the patient (or if unable, by the patient's family).  Yes   Patient/family informed of their freedom to choose among providers that offer the needed level of care, that participate in Medicare, Medicaid or managed care program needed by the patient, have an available bed and are willing to accept the patient.  Yes   Patient/family informed of [place]'s ownership interest in [facility] and [facility], as well as of the fact that they are under no obligation to receive care at these facilities.  PASRR submitted to EDS on       PASRR number received on       Existing PASRR number confirmed on 05/08/16     FL2 transmitted to all facilities in geographic area requested by pt/family on 05/08/16     FL2 transmitted to all facilities within larger geographic area on       Patient informed that his/her managed care company has contracts with or will negotiate with certain facilities, including the following:            Patient/family informed of bed offers received.  Patient chooses bed at       Physician recommends and patient chooses bed at      Patient to be transferred to   on  .  Patient to be transferred to facility by       Patient family notified on   of transfer.  Name of family member notified:        PHYSICIAN       Additional Comment:    _______________________________________________ Kinte Trim, Veronia Beets, LCSW 05/08/2016, 11:01 AM

## 2016-05-09 LAB — GLUCOSE, CAPILLARY
GLUCOSE-CAPILLARY: 220 mg/dL — AB (ref 65–99)
Glucose-Capillary: 331 mg/dL — ABNORMAL HIGH (ref 65–99)

## 2016-05-09 MED ORDER — PREDNISONE 10 MG PO TABS: ORAL_TABLET | ORAL | 0 refills | Status: DC

## 2016-05-09 MED ORDER — INSULIN DETEMIR 100 UNIT/ML ~~LOC~~ SOLN: 25.0000 [IU] | Freq: Every day | SUBCUTANEOUS | 11 refills | Status: AC

## 2016-05-09 MED ORDER — IPRATROPIUM-ALBUTEROL 20-100 MCG/ACT IN AERS: 1.0000 | INHALATION_SPRAY | Freq: Four times a day (QID) | RESPIRATORY_TRACT | Status: DC | PRN

## 2016-05-09 MED ORDER — POLYETHYLENE GLYCOL 3350 17 G PO PACK: 17.0000 g | PACK | Freq: Every day | ORAL | 0 refills | Status: AC | PRN

## 2016-05-09 MED ORDER — PREDNISONE 50 MG PO TABS
50.0000 mg | ORAL_TABLET | Freq: Every day | ORAL | Status: DC
  Administered 2016-05-09: 50 mg via ORAL
  Filled 2016-05-09: qty 1

## 2016-05-09 MED ORDER — ATORVASTATIN CALCIUM 40 MG PO TABS: 40.0000 mg | ORAL_TABLET | Freq: Every day | ORAL | 0 refills | Status: AC

## 2016-05-09 MED ORDER — TIOTROPIUM BROMIDE MONOHYDRATE 18 MCG IN CAPS: 18.0000 ug | ORAL_CAPSULE | Freq: Every day | RESPIRATORY_TRACT | 12 refills | Status: AC

## 2016-05-09 MED ORDER — INSULIN ASPART 100 UNIT/ML ~~LOC~~ SOLN
0.0000 [IU] | Freq: Three times a day (TID) | SUBCUTANEOUS | 11 refills | Status: AC
Start: 2016-05-09 — End: ?

## 2016-05-09 MED ORDER — IPRATROPIUM-ALBUTEROL 20-100 MCG/ACT IN AERS: 1.0000 | INHALATION_SPRAY | Freq: Four times a day (QID) | RESPIRATORY_TRACT | 1 refills | Status: AC | PRN

## 2016-05-09 MED ORDER — AZITHROMYCIN 250 MG PO TABS: ORAL_TABLET | ORAL | 0 refills | Status: DC

## 2016-05-09 NOTE — Evaluation (Signed)
Physical Therapy Evaluation Patient Details Name: Kaitlin Henderson MRN: [id number] DOB: [birth year] Today's Date: 05/09/2016   History of Present Illness  Pt is a 75 y.o. female presenting to hospital with SOB.  Pt admitted with acute on chronic hypoxic respiratory failure secondary to COPD exacerbation.  Of note, pt with recent discharge from SNF to independent living.  PMH includes R mastectomy, COPD, CHF, NSTEMI, DM, htn, HLD.  Clinical Impression  Prior to admission, pt was using electric w/c but did walk short distances with UE support on furniture.  Pt lives at [place] and just recently discharged home from [place].  Currently pt is min to mod assist with bed mobility and sit to/from stand transfers.  Limited functional mobility assessment d/t pt with significant SOB with minimal activity and requiring extended sitting rest breaks to recover (O2 >91% on 5 L/min O2 via nasal cannula during session).  Pt would benefit from skilled PT to address noted impairments and functional limitations.  Recommend pt discharge to STR when medically appropriate.    Follow Up Recommendations SNF    Equipment Recommendations   (TBD at next facility)    Recommendations for Other Services       Precautions / Restrictions Precautions Precautions: Fall Restrictions Weight Bearing Restrictions: No      Mobility  Bed Mobility Overal bed mobility: Needs Assistance Bed Mobility: Supine to Sit;Sit to Supine     Supine to sit: Min assist;Mod assist;HOB elevated Sit to supine: Mod assist;HOB elevated   General bed mobility comments: assist for trunk and to scoot to edge of bed supine to sit (required sitting rest break d/t significant SOB); assist for trunk and B LE's sit to supine; 2 assist to boost pt up in bed  Transfers Overall transfer level: Needs assistance Equipment used: Rolling walker (2 wheeled) Transfers: Sit to/from Stand Sit to Stand: Min assist;Mod assist          General transfer comment: B knees blocked; able to stand briefly but then needed to sit back down d/t significant SOB  Ambulation/Gait             General Gait Details: deferred at this time d/t significant SOB with minimal activity  Stairs            Wheelchair Mobility    Modified [name] (Stroke Patients Only)       Balance Overall balance assessment: Needs assistance Sitting-balance support: Bilateral upper extremity supported;Feet supported Sitting balance-Leahy Scale: Good     Standing balance support: Single extremity supported (on bed rail) Standing balance-Leahy Scale: Fair                               Pertinent Vitals/Pain Pain Assessment: No/denies pain    Home Living Family/patient expects to be discharged to:: Skilled nursing facility                 Additional Comments: Pt lives at [place] apartment    Prior Function Level of Independence: Independent with assistive device(s)         Comments: Transfers to electric w/c for mobility but does walk short distances holding onto items     Hand Dominance        Extremity/Trunk Assessment   Upper Extremity Assessment: Generalized weakness           Lower Extremity Assessment: Generalized weakness         Communication  Communication: No difficulties  Cognition Arousal/Alertness: Awake/alert Behavior During Therapy: WFL for tasks assessed/performed Overall Cognitive Status: Within Functional Limits for tasks assessed                      General Comments General comments (skin integrity, edema, etc.): Pt laying in bed eating lunch upon PT arrival but agreeable to PT session.  Nursing cleared pt for participation in physical therapy.  Pt agreeable to PT session.    Exercises        Assessment/Plan    PT Assessment Patient needs continued PT services  PT Diagnosis Difficulty walking;Generalized weakness   PT Problem List Decreased  strength;Decreased activity tolerance;Decreased balance;Decreased mobility;Cardiopulmonary status limiting activity  PT Treatment Interventions DME instruction;Gait training;Functional mobility training;Therapeutic activities;Therapeutic exercise;Balance training;Patient/family education   PT Goals (Current goals can be found in the Care Plan section) Acute Rehab PT Goals Patient Stated Goal: to be able to get feet on the ground to move PT Goal Formulation: With patient Time For Goal Achievement: 05/23/16 Potential to Achieve Goals: Good    Frequency Min 2X/week   Barriers to discharge Decreased caregiver support      Co-evaluation               End of Session Equipment Utilized During Treatment: Gait belt;Oxygen (5 L/min O2 via nasal cannula) Activity Tolerance: Patient limited by fatigue (Limited d/t significant SOB with activity) Patient left: in bed;with call bell/phone within reach;with bed alarm set Nurse Communication: Mobility status;Precautions         Time: 1145-1200 PT Time Calculation (min) (ACUTE ONLY): 15 min   Charges:   PT Evaluation $PT Eval Low Complexity: 1 Procedure     PT G CodesLeitha Bleak May 13, 2016, 12:31 PM Leitha Bleak, [place]

## 2016-05-09 NOTE — Progress Notes (Signed)
Patient remains alert and oriented but tearful at times. Xanax given this a.m for aniexty. Report called to Levada Dy at [facility] and EMS called for non-emergent transport. I will continue to assess.

## 2016-05-09 NOTE — Clinical Social Work Placement (Signed)
   CLINICAL SOCIAL WORK PLACEMENT  NOTE  Date:  05/09/2016  Patient Details  Name: Kaitlin Henderson MRN: [id number] Date of Birth: 1941-08-18  Clinical Social Work is seeking post-discharge placement for this patient at the [place] level of care (*CSW will initial, date and re-position this form in  chart as items are completed):  Yes   Patient/family provided with [place] Work Department's list of facilities offering this level of care within the geographic area requested by the patient (or if unable, by the patient's family).  Yes   Patient/family informed of their freedom to choose among providers that offer the needed level of care, that participate in Medicare, Medicaid or managed care program needed by the patient, have an available bed and are willing to accept the patient.  Yes   Patient/family informed of [place]'s ownership interest in [facility] and [facility], as well as of the fact that they are under no obligation to receive care at these facilities.  PASRR submitted to EDS on       PASRR number received on       Existing PASRR number confirmed on 05/08/16     FL2 transmitted to all facilities in geographic area requested by pt/family on 05/08/16     FL2 transmitted to all facilities within larger geographic area on       Patient informed that his/her managed care company has contracts with or will negotiate with certain facilities, including the following:        Yes   Patient/family informed of bed offers received.  Patient chooses bed at  [facility] )     Physician recommends and patient chooses bed at      Patient to be transferred to  [facility] ) on 05/09/16.  Patient to be transferred to facility by  [facility] )     Patient family notified on 05/09/16 of transfer.  Name of family member notified:   ([place] left voicemail for patient's boyfriend Randall Hiss making him aware of above. )      PHYSICIAN       Additional Comment:    _______________________________________________ Prentiss Hammett, Veronia Beets, LCSW 05/09/2016, 10:58 AM

## 2016-05-09 NOTE — Discharge Instructions (Signed)
Use your oxygen as before °

## 2016-05-09 NOTE — Discharge Summary (Signed)
[place] at [place] NAME: Kaitlin Henderson    MR#:  [id number]  DATE OF BIRTH:  February 20, 1941  DATE OF ADMISSION:  05/07/2016 ADMITTING PHYSICIAN: Nicholes Mango, MD  DATE OF DISCHARGE: 05/09/16 PRIMARY CARE PHYSICIAN: No PCP Per Patient    ADMISSION DIAGNOSIS:  Weakness;SOB; COPD  DISCHARGE DIAGNOSIS:  Acute on chronic COPD exacerbation  SECONDARY DIAGNOSIS:   Past Medical History:  Diagnosis Date  . Anginal pain ([place])   . Aphthous stomatitis   . APHTHOUS STOMATITIS   . Breast cancer, right breast ([place]) 1998   s/p chemo & XRT; right mastectomy  . Breast cancer, right breast ([place]) 1998   s/p chemo & XRT, right mastectomy  . C O P D    chronic O2 3LPM [place]  . CHF (congestive heart failure) ([place])   . COPD (chronic obstructive pulmonary disease) ([place])   . Coronary artery disease   . CORONARY ARTERY DISEASE   . Depression   . DEPRESSION    started sertraline 09/2010  . Diabetes mellitus, type 2 ([place]) 03/2010  . DIABETES, TYPE 2 dx 03/2010  . Essential tremor   . GERD (gastroesophageal reflux disease)   . Hyperlipemia   . HYPERLIPIDEMIA   . Hypothyroidism   . NSTEMI (non-ST elevated myocardial infarction) ([place]) 01/2012  . NSTEMI (non-ST elevated myocardial infarction) ([place]) 01/2012   med mgmt  . Obesity   . OBESITY     HOSPITAL COURSE:   Kaitlin Henderson a 75 y.o. femalewith a known history of COPD, chronic respiratory failure, lives on 5 L of oxygen,congestive heart failure, GERD, diabetes mellitus and multiple other medical problems is presenting to the ED with a chief complaint of worsening of shortness of breath. Patient was feeling tight in her chest and has received nebulizer treatments with no significant improvement. Patient was found to be tachycardic, hypoxemic.Chest x-ray with no acute abnormalities  #Acute on chronic hypoxic respiratory failure secondary to COPD exacerbation Solu-Medrol IV, nebulizer treatments  with duo nebs scheduled and albuterol as needed Empiric Azithromycin  -cxr no PNA -change to po steroids, add combivnet  #Diabetes mellitus Resume her home medication including long acting insulin and provide sliding scale insulin and diabetic diet -adjust insulin dosing due to steroid effect  #Hyperlipidemia Continue statin   #Generalized weakness with ambulatory difficulties -pt is from [place] and uses motorized WC  #PT consult is placed  D/c to rehab today after seen by PT  Pt has not been taking any meds including Diabetes meds. She reports her whole check is taken by [facility] and she does not have rx drug plan. CSW working on d/c plans Pt is at baseline for d/c  CONSULTS OBTAINED:    DRUG ALLERGIES:   Allergies  Allergen Reactions  . Codeine Other (See Comments)    Pt states that this medication makes her pass out.   . Levofloxacin Other (See Comments)    Reaction:  Redness   . Sulfa Antibiotics Swelling    DISCHARGE MEDICATIONS:   Current Discharge Medication List    START taking these medications   Details  azithromycin (ZITHROMAX) 250 MG tablet Take as directed Qty: 3 each, Refills: 0    insulin aspart (NOVOLOG) 100 UNIT/ML injection Inject 0-20 Units into the skin 3 (three) times daily with meals. Qty: 10 mL, Refills: 11    Ipratropium-Albuterol (COMBIVENT) 20-100 MCG/ACT AERS respimat Inhale 1 puff into the lungs every 6 (six) hours as needed for  wheezing. Qty: 1 Inhaler, Refills: 1    predniSONE (DELTASONE) 10 MG tablet Take 50 mg daily and taper by 10 mg daily then stop Qty: 15 tablet, Refills: 0      CONTINUE these medications which have CHANGED   Details  atorvastatin (LIPITOR) 40 MG tablet Take 1 tablet (40 mg total) by mouth at bedtime. Qty: 30 tablet, Refills: 0    insulin detemir (LEVEMIR) 100 UNIT/ML injection Inject 0.25 mLs (25 Units total) into the skin at bedtime. Qty: 10 mL, Refills: 11    polyethylene glycol  (MIRALAX / GLYCOLAX) packet Take 17 g by mouth daily as needed for mild constipation. Qty: 14 each, Refills: 0    tiotropium (SPIRIVA) 18 MCG inhalation capsule Place 1 capsule (18 mcg total) into inhaler and inhale daily. Qty: 30 capsule, Refills: 12      CONTINUE these medications which have NOT CHANGED   Details  aspirin 325 MG tablet Take 325 mg by mouth 2 (two) times daily.       STOP taking these medications     acidophilus (RISAQUAD) CAPS capsule      albuterol (PROVENTIL) (2.5 MG/3ML) 0.083% nebulizer solution      ALPRAZolam (XANAX) 0.25 MG tablet      clotrimazole (LOTRIMIN) 1 % cream      esomeprazole (NEXIUM) 40 MG capsule      guaiFENesin-dextromethorphan (ROBITUSSIN DM) 100-10 MG/5ML syrup         If you experience worsening of your admission symptoms, develop shortness of breath, life threatening emergency, suicidal or homicidal thoughts you must seek medical attention immediately by calling 911 or calling your MD immediately  if symptoms less severe.  You Must read complete instructions/literature along with all the possible adverse reactions/side effects for all the Medicines you take and that have been prescribed to you. Take any new Medicines after you have completely understood and accept all the possible adverse reactions/side effects.   Please note  You were cared for by a hospitalist during your hospital stay. If you have any questions about your discharge medications or the care you received while you were in the hospital after you are discharged, you can call the unit and asked to speak with the hospitalist on call if the hospitalist that took care of you is not available. Once you are discharged, your primary care physician will handle any further medical issues. Please note that NO REFILLS for any discharge medications will be authorized once you are discharged, as it is imperative that you return to your primary care physician (or establish a  relationship with a primary care physician if you do not have one) for your aftercare needs so that they can reassess your need for medications and monitor your lab values. Today   SUBJECTIVE  I like it here and don't want to leave   VITAL SIGNS:  Blood pressure (!) 132/59, pulse 91, temperature 98.4 F (36.9 C), resp. rate 18, height 5\' 3"  (1.6 m), weight 88.2 kg (194 lb 6.4 oz), SpO2 96 %.  I/O:   Intake/Output Summary (Last 24 hours) at 05/09/16 0801 Last data filed at 05/08/16 2341  Gross per 24 hour  Intake              840 ml  Output                0 ml  Net              840 ml  PHYSICAL EXAMINATION:  GENERAL:  75 y.o.-year-old patient lying in the bed with no acute distress.  EYES: Pupils equal, round, reactive to light and accommodation. No scleral icterus. Extraocular muscles intact.  HEENT: Head atraumatic, normocephalic. Oropharynx and nasopharynx clear.  NECK:  Supple, no jugular venous distention. No thyroid enlargement, no tenderness.  LUNGS: Normal breath sounds bilaterally, no wheezing, rales,rhonchi or crepitation. No use of accessory muscles of respiration.  CARDIOVASCULAR: S1, S2 normal. No murmurs, rubs, or gallops.  ABDOMEN: Soft, non-tender, non-distended. Bowel sounds present. No organomegaly or mass.  EXTREMITIES: No pedal edema, cyanosis, or clubbing.  NEUROLOGIC: Cranial nerves II through XII are intact. Muscle strength 5/5 in all extremities. Sensation intact. Gait not checked.  PSYCHIATRIC: The patient is alert and oriented x 3.  SKIN: No obvious rash, lesion, or ulcer.   DATA REVIEW:   CBC   Recent Labs Lab 05/08/16 0331  WBC 7.0  HGB 14.2  HCT 41.6  PLT 225    Chemistries   Recent Labs Lab 05/07/16 1522 05/08/16 0331  NA 138 138  K 4.3 4.2  CL 98* 98*  CO2 31 31  GLUCOSE 150* 280*  BUN 24* 21*  CREATININE 0.87 0.80  CALCIUM 9.1 8.4*  MG 1.7  --   AST 20  --   ALT 10*  --   ALKPHOS 65  --   BILITOT 0.8  --      Microbiology Results   Recent Results (from the past 240 hour(s))  MRSA PCR Screening     Status: None   Collection Time: 05/07/16 10:24 PM  Result Value Ref Range Status   MRSA by PCR NEGATIVE NEGATIVE Final    Comment:        The GeneXpert MRSA Assay (FDA approved for NASAL specimens only), is one component of a comprehensive MRSA colonization surveillance program. It is not intended to diagnose MRSA infection nor to guide or monitor treatment for MRSA infections.   Culture, sputum-assessment     Status: None (Preliminary result)   Collection Time: 05/08/16  2:06 AM  Result Value Ref Range Status   Specimen Description EXPECTORATED SPUTUM  Final   Special Requests NONE  Final   Sputum evaluation   Final    Sputum specimen not acceptable for testing.  Please recollect.   CALLED TO YASMAN ROSIANO @ [id number] ON 05/08/2016 BY CAF    Report Status PENDING  Incomplete    RADIOLOGY:  Dg Chest 2 View  Result Date: 05/07/2016 CLINICAL DATA:  75 year old female with acute shortness of breath. Initial encounter. EXAM: CHEST  2 VIEW COMPARISON:  Chest CT 02/22/2016 and earlier. FINDINGS: Upright AP and lateral views of the chest. On the lateral views the patient arms are not raised. Spiculated right apical lung nodule visible by CT is not evident radiographically. Lower lung volumes compared to 02/19/2016. No residual right pleural effusion identified. A degree of chronic right lung volume loss appears stable. No pneumothorax or pulmonary edema. On the AP view the patient is more rotated to the right mediastinal contours appear stable. Osteopenia. No acute osseous abnormality identified. Stable postoperative changes to the right axilla. IMPRESSION: 1. Spiculated right apical lung nodule for which PET-CT had previously been recommended is not evident radiographically. 2. Small right pleural effusion seen in June appears resolved. 3. No new cardiopulmonary abnormality identified. Electronically  Signed   By: Genevie Ann M.D.   On: 05/07/2016 16:11     Management plans discussed with the patient, family and  they are in agreement.  CODE STATUS:     Code Status Orders        Start     Ordered   05/07/16 2000  Full code  Continuous     05/07/16 1959    Code Status History    Date Active Date Inactive Code Status Order ID Comments User Context   02/20/2016 12:57 AM 02/23/2016  7:10 PM Full Code [id number]  Alesia Richards, MD ED   12/12/2015 11:19 AM 12/14/2015  5:43 PM Partial Code [id number]  Hillary Bow, MD ED   11/08/2015 10:32 PM 11/09/2015  9:53 PM Full Code [id number]  Lance Coon, MD Inpatient   11/04/2015 10:59 AM 11/08/2015 10:32 PM DNR [id number]  Laverle Hobby, MD Inpatient   11/02/2015  8:23 PM 11/04/2015 10:59 AM Full Code [id number]  Dustin Flock, MD Inpatient   01/17/2012  8:32 PM 01/28/2012  4:49 PM DNR [id number]  Mila Palmer, RN Inpatient    Advance Directive Documentation   Flowsheet Row Most Recent Value  Type of Advance Directive  Healthcare Power of Attorney [Eric Hick]  Pre-existing out of facility DNR order (yellow form or pink MOST form)  No data  "MOST" Form in Place?  No data      TOTAL TIME TAKING CARE OF THIS PATIENT:40 minutes.    Neave Lenger M.D on 05/09/2016 at 8:01 AM  Between 7am to 6pm - Pager - [phone] After 6pm go to [email or web address] - password [email or web address] [facility]  [facility]  Office  [phone]  CC: Primary care physician; No PCP Per Patient

## 2016-05-09 NOTE — Care Management Important Message (Signed)
Important Message  Patient Details  Name: Kaitlin Henderson MRN: [id number] Date of Birth: [birth year]   Medicare Important Message Given:  Yes    Jolly Mango, RN 05/09/2016, 11:11 AM

## 2016-05-09 NOTE — Progress Notes (Signed)
Patient is medically stable for D/C to [facility] today. Per [facility] admissions coordinator at [facility] patient will go to room 403. RN will call report to 400 hall and arrange EMS for transport. Clinical Education officer, museum (CSW) sent D/C orders to [employer] via [employer]. Patient does not need a 3 night stay because she will be going to SNF using the same admission and same days used at [employer]. CSW emphasized to patient that she needs to finish her long term care Medicaid application. Per patient she will get Randall Hiss her boyfriend to help with the Medicaid application. CSW also emphasized that once she has used the rest of her Medicare days and she does not have a pending Medicaid application she will have no choice but to return to [facility]. CSW also disucssed with patient letting her son and daughter assist with Mediciad application however patient stated that they have "turned their backs on her." CSW called Vance Gather [facility] worker and left 2 voicemails. CSW also called APS Otilio Connors to see if she can provide assistance. CSW also left patient's boyfriend Randall Hiss a voicemail making him aware of above. Please reconsult if future social work needs arise. CSW signing off.   [employer], LCSW [phone]

## 2016-05-14 LAB — EXPECTORATED SPUTUM ASSESSMENT W GRAM STAIN, RFLX TO RESP C

## 2016-05-14 LAB — EXPECTORATED SPUTUM ASSESSMENT W REFEX TO RESP CULTURE

## 2016-05-15 DIAGNOSIS — E119 Type 2 diabetes mellitus without complications: Secondary | ICD-10-CM | POA: Diagnosis not present

## 2016-05-15 DIAGNOSIS — I10 Essential (primary) hypertension: Secondary | ICD-10-CM | POA: Diagnosis not present

## 2016-05-15 DIAGNOSIS — J441 Chronic obstructive pulmonary disease with (acute) exacerbation: Secondary | ICD-10-CM | POA: Diagnosis not present

## 2016-05-15 DIAGNOSIS — I2510 Atherosclerotic heart disease of native coronary artery without angina pectoris: Secondary | ICD-10-CM | POA: Diagnosis not present

## 2016-05-31 ENCOUNTER — Emergency Department: Payer: Medicare Other

## 2016-05-31 ENCOUNTER — Inpatient Hospital Stay
Admission: EM | Admit: 2016-05-31 | Discharge: 2016-06-02 | DRG: 190 | Disposition: A | Discharge status: Home Health | Payer: Medicare Other | Attending: Internal Medicine | Admitting: Internal Medicine

## 2016-05-31 ENCOUNTER — Encounter: Payer: Self-pay | Admitting: Emergency Medicine

## 2016-05-31 DIAGNOSIS — Z7982 Long term (current) use of aspirin: Secondary | ICD-10-CM | POA: Diagnosis not present

## 2016-05-31 DIAGNOSIS — R197 Diarrhea, unspecified: Secondary | ICD-10-CM | POA: Diagnosis present

## 2016-05-31 DIAGNOSIS — Z9011 Acquired absence of right breast and nipple: Secondary | ICD-10-CM

## 2016-05-31 DIAGNOSIS — M545 Low back pain, unspecified: Secondary | ICD-10-CM

## 2016-05-31 DIAGNOSIS — Z87891 Personal history of nicotine dependence: Secondary | ICD-10-CM | POA: Diagnosis not present

## 2016-05-31 DIAGNOSIS — Z794 Long term (current) use of insulin: Secondary | ICD-10-CM

## 2016-05-31 DIAGNOSIS — K219 Gastro-esophageal reflux disease without esophagitis: Secondary | ICD-10-CM | POA: Diagnosis present

## 2016-05-31 DIAGNOSIS — E785 Hyperlipidemia, unspecified: Secondary | ICD-10-CM | POA: Diagnosis present

## 2016-05-31 DIAGNOSIS — E669 Obesity, unspecified: Secondary | ICD-10-CM | POA: Diagnosis present

## 2016-05-31 DIAGNOSIS — R531 Weakness: Secondary | ICD-10-CM

## 2016-05-31 DIAGNOSIS — I252 Old myocardial infarction: Secondary | ICD-10-CM

## 2016-05-31 DIAGNOSIS — Z79899 Other long term (current) drug therapy: Secondary | ICD-10-CM

## 2016-05-31 DIAGNOSIS — I2510 Atherosclerotic heart disease of native coronary artery without angina pectoris: Secondary | ICD-10-CM | POA: Diagnosis present

## 2016-05-31 DIAGNOSIS — Z9221 Personal history of antineoplastic chemotherapy: Secondary | ICD-10-CM | POA: Diagnosis not present

## 2016-05-31 DIAGNOSIS — Z881 Allergy status to other antibiotic agents status: Secondary | ICD-10-CM

## 2016-05-31 DIAGNOSIS — Z923 Personal history of irradiation: Secondary | ICD-10-CM

## 2016-05-31 DIAGNOSIS — R0689 Other abnormalities of breathing: Secondary | ICD-10-CM

## 2016-05-31 DIAGNOSIS — Z9981 Dependence on supplemental oxygen: Secondary | ICD-10-CM

## 2016-05-31 DIAGNOSIS — R52 Pain, unspecified: Secondary | ICD-10-CM

## 2016-05-31 DIAGNOSIS — W050XXA Fall from non-moving wheelchair, initial encounter: Secondary | ICD-10-CM | POA: Diagnosis present

## 2016-05-31 DIAGNOSIS — F329 Major depressive disorder, single episode, unspecified: Secondary | ICD-10-CM | POA: Diagnosis present

## 2016-05-31 DIAGNOSIS — J9622 Acute and chronic respiratory failure with hypercapnia: Secondary | ICD-10-CM | POA: Diagnosis present

## 2016-05-31 DIAGNOSIS — M6281 Muscle weakness (generalized): Secondary | ICD-10-CM

## 2016-05-31 DIAGNOSIS — Z885 Allergy status to narcotic agent status: Secondary | ICD-10-CM

## 2016-05-31 DIAGNOSIS — Z853 Personal history of malignant neoplasm of breast: Secondary | ICD-10-CM

## 2016-05-31 DIAGNOSIS — J9621 Acute and chronic respiratory failure with hypoxia: Secondary | ICD-10-CM | POA: Diagnosis present

## 2016-05-31 DIAGNOSIS — R262 Difficulty in walking, not elsewhere classified: Secondary | ICD-10-CM

## 2016-05-31 DIAGNOSIS — E039 Hypothyroidism, unspecified: Secondary | ICD-10-CM | POA: Diagnosis present

## 2016-05-31 DIAGNOSIS — E119 Type 2 diabetes mellitus without complications: Secondary | ICD-10-CM

## 2016-05-31 DIAGNOSIS — J449 Chronic obstructive pulmonary disease, unspecified: Secondary | ICD-10-CM | POA: Diagnosis present

## 2016-05-31 DIAGNOSIS — J441 Chronic obstructive pulmonary disease with (acute) exacerbation: Secondary | ICD-10-CM | POA: Diagnosis not present

## 2016-05-31 DIAGNOSIS — G250 Essential tremor: Secondary | ICD-10-CM | POA: Diagnosis present

## 2016-05-31 DIAGNOSIS — M25559 Pain in unspecified hip: Secondary | ICD-10-CM

## 2016-05-31 LAB — CBC WITH DIFFERENTIAL/PLATELET
BASOS ABS: 0 10*3/uL (ref 0–0.1)
Basophils Relative: 1 %
EOS ABS: 0.3 10*3/uL (ref 0–0.7)
EOS PCT: 4 %
HCT: 44.9 % (ref 35.0–47.0)
Hemoglobin: 15.9 g/dL (ref 12.0–16.0)
Lymphocytes Relative: 32 %
Lymphs Abs: 2.4 10*3/uL (ref 1.0–3.6)
MCH: 33.3 pg (ref 26.0–34.0)
MCHC: 35.3 g/dL (ref 32.0–36.0)
MCV: 94.4 fL (ref 80.0–100.0)
MONO ABS: 0.6 10*3/uL (ref 0.2–0.9)
Monocytes Relative: 8 %
Neutro Abs: 4.1 10*3/uL (ref 1.4–6.5)
Neutrophils Relative %: 55 %
PLATELETS: 240 10*3/uL (ref 150–440)
RBC: 4.76 MIL/uL (ref 3.80–5.20)
RDW: 14 % (ref 11.5–14.5)
WBC: 7.4 10*3/uL (ref 3.6–11.0)

## 2016-05-31 LAB — BLOOD GAS, VENOUS
Acid-Base Excess: 5.8 mmol/L — ABNORMAL HIGH (ref 0.0–2.0)
Bicarbonate: 34.9 mmol/L — ABNORMAL HIGH (ref 20.0–28.0)
PATIENT TEMPERATURE: 37
PCO2 VEN: 71 mmHg — AB (ref 44.0–60.0)
PH VEN: 7.3 (ref 7.250–7.430)

## 2016-05-31 LAB — COMPREHENSIVE METABOLIC PANEL
ALK PHOS: 67 U/L (ref 38–126)
ALT: 12 U/L — AB (ref 14–54)
AST: 19 U/L (ref 15–41)
Albumin: 3.8 g/dL (ref 3.5–5.0)
Anion gap: 7 (ref 5–15)
BILIRUBIN TOTAL: 1.2 mg/dL (ref 0.3–1.2)
BUN: 17 mg/dL (ref 6–20)
CO2: 36 mmol/L — ABNORMAL HIGH (ref 22–32)
CREATININE: 0.83 mg/dL (ref 0.44–1.00)
Calcium: 9.3 mg/dL (ref 8.9–10.3)
Chloride: 100 mmol/L — ABNORMAL LOW (ref 101–111)
GFR calc Af Amer: >60 mL/min (ref >60)
GLUCOSE: 113 mg/dL — AB (ref 65–99)
Potassium: 4.5 mmol/L (ref 3.5–5.1)
Sodium: 143 mmol/L (ref 135–145)
TOTAL PROTEIN: 7.2 g/dL (ref 6.5–8.1)

## 2016-05-31 LAB — URINALYSIS COMPLETE WITH MICROSCOPIC (ARMC ONLY)
Bilirubin Urine: NEGATIVE
Glucose, UA: NEGATIVE mg/dL
Hgb urine dipstick: NEGATIVE
Leukocytes, UA: NEGATIVE
NITRITE: NEGATIVE
PH: 5 (ref 5.0–8.0)
PROTEIN: NEGATIVE mg/dL
Specific Gravity, Urine: 1.023 (ref 1.005–1.030)

## 2016-05-31 LAB — MRSA PCR SCREENING: MRSA BY PCR: NEGATIVE

## 2016-05-31 LAB — C DIFFICILE QUICK SCREEN W PCR REFLEX
C DIFFICILE (CDIFF) INTERP: NOT DETECTED
C DIFFICILE (CDIFF) TOXIN: NEGATIVE
C DIFFICLE (CDIFF) ANTIGEN: NEGATIVE

## 2016-05-31 LAB — LACTIC ACID, PLASMA: LACTIC ACID, VENOUS: 1.5 mmol/L (ref 0.5–1.9)

## 2016-05-31 LAB — CK: CK TOTAL: 39 U/L (ref 38–234)

## 2016-05-31 LAB — GLUCOSE, CAPILLARY: GLUCOSE-CAPILLARY: 196 mg/dL — AB (ref 65–99)

## 2016-05-31 LAB — TROPONIN I: Troponin I: <0.03 ng/mL (ref <0.03)

## 2016-05-31 MED ORDER — ACETAMINOPHEN 650 MG RE SUPP: 650.0000 mg | Freq: Four times a day (QID) | RECTAL | Status: DC | PRN

## 2016-05-31 MED ORDER — TIOTROPIUM BROMIDE MONOHYDRATE 18 MCG IN CAPS
18.0000 ug | ORAL_CAPSULE | Freq: Every day | RESPIRATORY_TRACT | Status: DC
  Administered 2016-05-31 – 2016-06-02 (×3): 18 ug via RESPIRATORY_TRACT
  Filled 2016-05-31: qty 5

## 2016-05-31 MED ORDER — INSULIN DETEMIR 100 UNIT/ML ~~LOC~~ SOLN
25.0000 [IU] | Freq: Every day | SUBCUTANEOUS | Status: DC
  Administered 2016-05-31 – 2016-06-01 (×2): 25 [IU] via SUBCUTANEOUS
  Filled 2016-05-31 (×3): qty 0.25

## 2016-05-31 MED ORDER — INSULIN ASPART 100 UNIT/ML ~~LOC~~ SOLN
0.0000 [IU] | Freq: Three times a day (TID) | SUBCUTANEOUS | Status: DC
  Administered 2016-06-01: 5 [IU] via SUBCUTANEOUS
  Administered 2016-06-01: 7 [IU] via SUBCUTANEOUS
  Administered 2016-06-01: 3 [IU] via SUBCUTANEOUS
  Administered 2016-06-02: 1 [IU] via SUBCUTANEOUS
  Administered 2016-06-02: 5 [IU] via SUBCUTANEOUS
  Filled 2016-05-31: qty 5
  Filled 2016-05-31: qty 1
  Filled 2016-05-31: qty 3
  Filled 2016-05-31: qty 7
  Filled 2016-05-31: qty 5

## 2016-05-31 MED ORDER — ONDANSETRON HCL 4 MG/2ML IJ SOLN: 4.0000 mg | Freq: Four times a day (QID) | INTRAMUSCULAR | Status: DC | PRN

## 2016-05-31 MED ORDER — SODIUM CHLORIDE 0.9 % IV BOLUS (SEPSIS)
1000.0000 mL | Freq: Once | INTRAVENOUS | Status: AC
Start: 2016-05-31 — End: 2016-05-31
  Administered 2016-05-31: 1000 mL via INTRAVENOUS

## 2016-05-31 MED ORDER — ACETAMINOPHEN 325 MG PO TABS
650.0000 mg | ORAL_TABLET | Freq: Four times a day (QID) | ORAL | Status: DC | PRN
  Administered 2016-06-01: 650 mg via ORAL
  Filled 2016-05-31: qty 2

## 2016-05-31 MED ORDER — IPRATROPIUM-ALBUTEROL 0.5-2.5 (3) MG/3ML IN SOLN
3.0000 mL | RESPIRATORY_TRACT | Status: DC
  Administered 2016-06-01 – 2016-06-02 (×10): 3 mL via RESPIRATORY_TRACT
  Filled 2016-05-31 (×10): qty 3

## 2016-05-31 MED ORDER — ASPIRIN 325 MG PO TABS
325.0000 mg | ORAL_TABLET | Freq: Two times a day (BID) | ORAL | Status: DC
  Administered 2016-05-31 – 2016-06-02 (×4): 325 mg via ORAL
  Filled 2016-05-31 (×4): qty 1

## 2016-05-31 MED ORDER — ENOXAPARIN SODIUM 40 MG/0.4ML ~~LOC~~ SOLN
40.0000 mg | SUBCUTANEOUS | Status: DC
  Filled 2016-05-31 (×2): qty 0.4

## 2016-05-31 MED ORDER — ALBUTEROL SULFATE (2.5 MG/3ML) 0.083% IN NEBU
5.0000 mg | INHALATION_SOLUTION | Freq: Once | RESPIRATORY_TRACT | Status: AC
  Administered 2016-05-31: 5 mg via RESPIRATORY_TRACT
  Filled 2016-05-31: qty 6

## 2016-05-31 MED ORDER — SODIUM CHLORIDE 0.9 % IV SOLN
1000.0000 mL | INTRAVENOUS | Status: DC
  Administered 2016-05-31: 1000 mL via INTRAVENOUS

## 2016-05-31 MED ORDER — INSULIN ASPART 100 UNIT/ML ~~LOC~~ SOLN: 0.0000 [IU] | Freq: Every day | SUBCUTANEOUS | Status: DC

## 2016-05-31 MED ORDER — METHYLPREDNISOLONE SODIUM SUCC 125 MG IJ SOLR
60.0000 mg | INTRAMUSCULAR | Status: DC
  Administered 2016-05-31 – 2016-06-01 (×2): 60 mg via INTRAVENOUS
  Filled 2016-05-31 (×2): qty 2

## 2016-05-31 MED ORDER — ONDANSETRON HCL 4 MG PO TABS: 4.0000 mg | ORAL_TABLET | Freq: Four times a day (QID) | ORAL | Status: DC | PRN

## 2016-05-31 MED ORDER — ATORVASTATIN CALCIUM 20 MG PO TABS
40.0000 mg | ORAL_TABLET | Freq: Every day | ORAL | Status: DC
  Administered 2016-05-31 – 2016-06-01 (×2): 40 mg via ORAL
  Filled 2016-05-31 (×2): qty 2

## 2016-05-31 MED ORDER — IPRATROPIUM-ALBUTEROL 0.5-2.5 (3) MG/3ML IN SOLN
3.0000 mL | Freq: Once | RESPIRATORY_TRACT | Status: AC
  Administered 2016-05-31: 3 mL via RESPIRATORY_TRACT
  Filled 2016-05-31: qty 3

## 2016-05-31 NOTE — ED Notes (Signed)
respiratory called.

## 2016-05-31 NOTE — ED Notes (Signed)
Nurse giving pt's breathing tx. Appropriate signs have been placed along with PPO for enteric precautions.

## 2016-05-31 NOTE — ED Notes (Signed)
Dr. Quentin Cornwall notified of pt's diarrhea. Pt's BM has strong smell and is light brown and watery. Sample collected to send to lab. Pt cleaned and turned. Pt is in NAD at this time.

## 2016-05-31 NOTE — ED Provider Notes (Signed)
[facility] Emergency Department Provider Note    First MD Initiated Contact with Patient 05/31/16 1300     (approximate)  I have reviewed the triage vital signs and the nursing notes.   HISTORY  Chief Complaint Weakness    HPI Kaitlin Henderson is a 75 y.o. female  who presents with generalized weakness from assisted living facility. Patient with extensive comorbidities presents after being found down in her bed covered in stool and urine. Patient was unable to move for 24 hours due to diffuse weakness. ROM of. States that she did fall from her wheelchair onto her bottom one day ago. Denies any fevers. Denies any shortness of breath.  Past Medical History:  Diagnosis Date  . Anginal pain ([place])   . Aphthous stomatitis   . APHTHOUS STOMATITIS   . Breast cancer, right breast ([place]) 1998   s/p chemo & XRT; right mastectomy  . Breast cancer, right breast ([place]) 1998   s/p chemo & XRT, right mastectomy  . C O P D    chronic O2 3LPM [place]  . CHF (congestive heart failure) ([place])   . COPD (chronic obstructive pulmonary disease) ([place])   . Coronary artery disease   . CORONARY ARTERY DISEASE   . Depression   . DEPRESSION    started sertraline 09/2010  . Diabetes mellitus, type 2 ([place]) 03/2010  . DIABETES, TYPE 2 dx 03/2010  . Essential tremor   . GERD (gastroesophageal reflux disease)   . Hyperlipemia   . HYPERLIPIDEMIA   . Hypothyroidism   . NSTEMI (non-ST elevated myocardial infarction) ([place]) 01/2012  . NSTEMI (non-ST elevated myocardial infarction) ([place]) 01/2012   med mgmt  . Obesity   . OBESITY     Patient Active Problem List   Diagnosis Date Noted  . Lung nodule 11/25/2015  . Anxiety 11/25/2015  . GERD (gastroesophageal reflux disease) 11/25/2015  . Cellulitis 11/16/2015  . 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 Acute on chronic respiratory failure with hypoxia ([place])   . Non-compliant behavior 12/05/2014  . Decreased mobility 08/15/2014    . Benign essential tremor 09/05/2011  . Breast cancer, right breast ([place])   . DEPRESSION 09/25/2010  . HAIR LOSS 06/26/2010  . Type 2 diabetes mellitus with neurological complications ([place]) [id number]  . POLYURIA 03/13/2010  . APHTHOUS STOMATITIS 08/23/2009  . CHRONIC RESPIRATORY FAILURE 08/18/2007  . Hyperlipidemia 08/17/2007  . OBESITY 08/17/2007  . CORONARY ARTERY DISEASE 08/17/2007  . COPD with emphysema, [place]  08/17/2007    Past Surgical History:  Procedure Laterality Date  . ABDOMINAL HYSTERECTOMY    . APPENDECTOMY    . CHOLECYSTECTOMY    . MASTECTOMY    . right mastectomy    . Right Mastectomy    . TONSILLECTOMY      Prior to Admission medications   Medication Sig Start Date End Date Taking? Authorizing Provider  aspirin 325 MG tablet Take 325 mg by mouth 2 (two) times daily.     Historical Provider, MD  atorvastatin (LIPITOR) 40 MG tablet Take 1 tablet (40 mg total) by mouth at bedtime. 05/09/16   Fritzi Mandes, MD  azithromycin (ZITHROMAX) 250 MG tablet Take as directed 05/09/16   Fritzi Mandes, MD  insulin aspart (NOVOLOG) 100 UNIT/ML injection Inject 0-20 Units into the skin 3 (three) times daily with meals. 05/09/16   Fritzi Mandes, MD  insulin detemir (LEVEMIR) 100 UNIT/ML injection Inject 0.25 mLs (25 Units total) into the skin at bedtime.  05/09/16   Fritzi Mandes, MD  Ipratropium-Albuterol (COMBIVENT) 20-100 MCG/ACT AERS respimat Inhale 1 puff into the lungs every 6 (six) hours as needed for wheezing. 05/09/16   Fritzi Mandes, MD  polyethylene glycol (MIRALAX / GLYCOLAX) packet Take 17 g by mouth daily as needed for mild constipation. 05/09/16   Fritzi Mandes, MD  predniSONE (DELTASONE) 10 MG tablet Take 50 mg daily and taper by 10 mg daily then stop 05/09/16   Fritzi Mandes, MD  tiotropium (SPIRIVA) 18 MCG inhalation capsule Place 1 capsule (18 mcg total) into inhaler and inhale daily. 05/09/16   Fritzi Mandes, MD    Allergies Codeine; Levofloxacin; and Sulfa antibiotics  Family History   Problem Relation Age of Onset  . Diabetes Neg Hx   . Cancer Neg Hx     Social History Social History  Substance Use Topics  . Smoking status: Former Smoker    Packs/day: 2.50    Years: 54.00    Types: Cigarettes    Quit date: 09/02/1997  . Smokeless tobacco: Never Used     Comment: Retired. Widow/widower since 2008-lives alone. Home hospice since 06/2009 related to COPD  . Alcohol use No    Review of Systems Patient denies headaches, rhinorrhea, blurry vision, numbness, shortness of breath, chest pain, edema, cough, abdominal pain, nausea, vomiting, diarrhea, dysuria, fevers, rashes or hallucinations unless otherwise stated above in HPI. ____________________________________________   PHYSICAL EXAM:  VITAL SIGNS: Vitals:   05/31/16 2030 05/31/16 2124  BP: (!) 125/46 (!) 121/52  Pulse: (!) 114 (!) 105  Resp: (!) 25 (!) 22  Temp:  97.9 F (36.6 C)    Constitutional: Alert and oriented.ill apearing and smells of urine Eyes: Conjunctivae are normal. PERRL. EOMI. Head: Atraumatic. Nose: No congestion/rhinnorhea. Mouth/Throat: Mucous membranes are moist.  Oropharynx non-erythematous. Neck: No stridor. Painless ROM. No cervical spine tenderness to palpation Hematological/Lymphatic/Immunilogical: No cervical lymphadenopathy. Cardiovascular: Normal rate, regular rhythm. Grossly normal heart sounds.  Good peripheral circulation. Respiratory: tachypnic.  No retractions. Coarse bibasilar breath sounds Gastrointestinal: Soft and nontender. No distention. No abdominal bruits. No CVA tenderness. Genitourinary:  Musculoskeletal: No lower extremity tenderness nor edema.  No joint effusions. Neurologic:  Normal speech and language. No gross focal neurologic deficits are appreciated. No gait instability. Skin:  Skin is warm, dry and intact. No rash noted. Psychiatric: Mood and affect are normal. Speech and behavior are normal.  ____________________________________________    LABS (all labs ordered are listed, but only abnormal results are displayed)  Results for orders placed or performed during the hospital encounter of 05/31/16 (from the past 24 hour(s))  Comprehensive metabolic panel     Status: Abnormal   Collection Time: 05/31/16  1:55 PM  Result Value Ref Range   Sodium 143 135 - 145 mmol/L   Potassium 4.5 3.5 - 5.1 mmol/L   Chloride 100 (L) 101 - 111 mmol/L   CO2 36 (H) 22 - 32 mmol/L   Glucose, Bld 113 (H) 65 - 99 mg/dL   BUN 17 6 - 20 mg/dL   Creatinine, Ser 0.83 0.44 - 1.00 mg/dL   Calcium 9.3 8.9 - 10.3 mg/dL   Total Protein 7.2 6.5 - 8.1 g/dL   Albumin 3.8 3.5 - 5.0 g/dL   AST 19 15 - 41 U/L   ALT 12 (L) 14 - 54 U/L   Alkaline Phosphatase 67 38 - 126 U/L   Total Bilirubin 1.2 0.3 - 1.2 mg/dL   GFR calc non Af Amer >60 >60 mL/min  GFR calc Af Amer >60 >60 mL/min   Anion gap 7 5 - 15  CBC WITH DIFFERENTIAL     Status: None   Collection Time: 05/31/16  1:55 PM  Result Value Ref Range   WBC 7.4 3.6 - 11.0 K/uL   RBC 4.76 3.80 - 5.20 MIL/uL   Hemoglobin 15.9 12.0 - 16.0 g/dL   HCT 44.9 35.0 - 47.0 %   MCV 94.4 80.0 - 100.0 fL   MCH 33.3 26.0 - 34.0 pg   MCHC 35.3 32.0 - 36.0 g/dL   RDW 14.0 11.5 - 14.5 %   Platelets 240 150 - 440 K/uL   Neutrophils Relative % 55 %   Neutro Abs 4.1 1.4 - 6.5 K/uL   Lymphocytes Relative 32 %   Lymphs Abs 2.4 1.0 - 3.6 K/uL   Monocytes Relative 8 %   Monocytes Absolute 0.6 0.2 - 0.9 K/uL   Eosinophils Relative 4 %   Eosinophils Absolute 0.3 0 - 0.7 K/uL   Basophils Relative 1 %   Basophils Absolute 0.0 0 - 0.1 K/uL  Troponin I     Status: None   Collection Time: 05/31/16  1:55 PM  Result Value Ref Range   Troponin I <0.03 <0.03 ng/mL  CK     Status: None   Collection Time: 05/31/16  1:55 PM  Result Value Ref Range   Total CK 39 38 - 234 U/L  Lactic acid, plasma     Status: None   Collection Time: 05/31/16  1:56 PM  Result Value Ref Range   Lactic Acid, Venous 1.5 0.5 - 1.9 mmol/L   Urinalysis complete, with microscopic ([facility] only)     Status: Abnormal   Collection Time: 05/31/16  1:56 PM  Result Value Ref Range   Color, Urine YELLOW (A) YELLOW   APPearance CLEAR (A) CLEAR   Glucose, UA NEGATIVE NEGATIVE mg/dL   Bilirubin Urine NEGATIVE NEGATIVE   Ketones, ur 2+ (A) NEGATIVE mg/dL   Specific Gravity, Urine 1.023 1.005 - 1.030   Hgb urine dipstick NEGATIVE NEGATIVE   pH 5.0 5.0 - 8.0   Protein, ur NEGATIVE NEGATIVE mg/dL   Nitrite NEGATIVE NEGATIVE   Leukocytes, UA NEGATIVE NEGATIVE   RBC / HPF 0-5 0 - 5 RBC/hpf   WBC, UA 0-5 0 - 5 WBC/hpf   Bacteria, UA RARE (A) NONE SEEN   Squamous Epithelial / LPF 0-5 (A) NONE SEEN   Mucous PRESENT    Hyaline Casts, UA PRESENT   C difficile quick scan w PCR reflex     Status: None   Collection Time: 05/31/16  3:31 PM  Result Value Ref Range   C Diff antigen NEGATIVE NEGATIVE   C Diff toxin NEGATIVE NEGATIVE   C Diff interpretation No C. difficile detected.   Blood gas, venous     Status: Abnormal   Collection Time: 05/31/16  6:17 PM  Result Value Ref Range   pH, Ven 7.30 7.250 - 7.430   pCO2, Ven 71 (HH) 44.0 - 60.0 mmHg   pO2, Ven <31.0 (LL) 32.0 - 45.0 mmHg   Bicarbonate 34.9 (H) 20.0 - 28.0 mmol/L   Acid-Base Excess 5.8 (H) 0.0 - 2.0 mmol/L   Patient temperature 37.0    Collection site VEIN    Sample type VENOUS   Glucose, capillary     Status: Abnormal   Collection Time: 05/31/16  9:35 PM  Result Value Ref Range   Glucose-Capillary 196 (H) 65 - 99 mg/dL  Comment 1 Notify RN    ____________________________________________ _________________________  RADIOLOGY  I personally reviewed all radiographic images ordered to evaluate for the above acute complaints and reviewed radiology reports and findings.  These findings were personally discussed with the patient.  Please see medical record for radiology report.  ____________________________________________   PROCEDURES  Procedure(s) performed:  none    Critical Care performed: no ____________________________________________   INITIAL IMPRESSION / ASSESSMENT AND PLAN / ED COURSE  Pertinent labs & imaging results that were available during my care of the patient were reviewed by me and considered in my medical decision making (see chart for details).  DDX: copd, pna chf, uti, c diff,   Kaitlin Henderson is a 75 y.o. who presents to the ED with Complaint of generalized weakness. Exam consistent with COPD exacerbation. Evidence of acute hypercapnia. New tachypnea. Abdominal exam soft and benign. No evidence of acute infection. Patient will require admission for additional nebulizers.  Have discussed with the patient and available family all diagnostics and treatments performed thus far and all questions were answered to the best of my ability. The patient demonstrates understanding and agreement with plan.   Clinical Course     ____________________________________________   FINAL CLINICAL IMPRESSION(S) / ED DIAGNOSES  Final diagnoses:  Chronic obstructive pulmonary disease with acute exacerbation (HCC)  Hypercapnia  Diarrhea, unspecified type      NEW MEDICATIONS STARTED DURING THIS VISIT:  Current Discharge Medication List       Note:  This document was prepared using Dragon voice recognition software and may include unintentional dictation errors.    Merlyn Lot, MD 05/31/16 2153

## 2016-05-31 NOTE — Care Management Note (Signed)
Case Management Note  Patient Details  Name: Kaitlin Henderson MRN: [id number] Date of Birth: Aug 15, 1941  Subjective/Objective:   VM  Tells [place] that Gevena Barre at [facility] has placed an APS referral on this lady.   She is at [phone].              Action/Plan:   Expected Discharge Date:                  Expected Discharge Plan:     In-House Referral:     Discharge planning Services     Post Acute Care Choice:    Choice offered to:     DME Arranged:    DME Agency:     HH Arranged:    HH Agency:     Status of Service:     If discussed at [employer] of Stay Meetings, dates discussed:    Additional Comments:  Beau Fanny, RN 05/31/2016, 1:12 PM

## 2016-05-31 NOTE — ED Triage Notes (Signed)
Pt to ED today via EMS from [facility]  Pt states she became weak and had trouble completing normal ADL's 2 days ago and fell.  EMS came after fall, but pt refused transport to ED.  Today, pt was found lying in urine and stool in her bed (pt normally able to get in w/c on her own).  Pt alert and oriented on arrival.  Deconed on arrival.

## 2016-05-31 NOTE — ED Notes (Signed)
Transporting patient to room 158-1A

## 2016-05-31 NOTE — H&P (Signed)
[facility] at [place] NAME: Kaitlin Henderson    MR#:  [id number]  DATE OF BIRTH:  02-11-1941   DATE OF ADMISSION:  05/31/2016  PRIMARY CARE PHYSICIAN: No PCP Per Patient   REQUESTING/REFERRING PHYSICIAN: Quentin Cornwall  CHIEF COMPLAINT:   Chief Complaint  Patient presents with  . Weakness    HISTORY OF PRESENT ILLNESS:  Kaitlin Henderson  is a 75 y.o. female with a known history of COPD oxygen requiring, insulin requiring diabetes who is presenting with weakness. At her care facility she was found covered in stool and urine. She still states that she had an issue with diarrhea which spontaneously resolved 1 week ago at that time she stated her stools were black but resolved, now complaining of one day duration of diarrhea multiple bouts of loose watery stools. Also complaining of generalized weakness fatigue. Cough, nonproductive some shortness of breath however she felt not more than usual. Currently she is somewhat a poor historian PAST MEDICAL HISTORY:   Past Medical History:  Diagnosis Date  . Anginal pain ([place])   . Aphthous stomatitis   . APHTHOUS STOMATITIS   . Breast cancer, right breast ([place]) 1998   s/p chemo & XRT; right mastectomy  . Breast cancer, right breast ([place]) 1998   s/p chemo & XRT, right mastectomy  . 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 CHF (congestive heart failure) ([place])   . COPD (chronic obstructive pulmonary disease) ([place])   . Coronary artery disease   . CORONARY ARTERY DISEASE   . Depression   . DEPRESSION    started sertraline 09/2010  . Diabetes mellitus, type 2 ([place]) 03/2010  . DIABETES, TYPE 2 dx 03/2010  . Essential tremor   . GERD (gastroesophageal reflux disease)   . Hyperlipemia   . HYPERLIPIDEMIA   . Hypothyroidism   . NSTEMI (non-ST elevated myocardial infarction) ([place]) 01/2012  . NSTEMI (non-ST elevated myocardial infarction) ([place]) 01/2012   med mgmt  . Obesity   . OBESITY     PAST SURGICAL HISTORY:     Past Surgical History:  Procedure Laterality Date  . ABDOMINAL HYSTERECTOMY    . APPENDECTOMY    . CHOLECYSTECTOMY    . MASTECTOMY    . right mastectomy    . Right Mastectomy    . TONSILLECTOMY      SOCIAL HISTORY:   Social History  Substance Use Topics  . Smoking status: Former Smoker    Packs/day: 2.50    Years: 54.00    Types: Cigarettes    Quit date: 09/02/1997  . Smokeless tobacco: Never Used     Comment: Retired. Widow/widower since 2008-lives alone. Home hospice since 06/2009 related to COPD  . Alcohol use No    FAMILY HISTORY:   Family History  Problem Relation Age of Onset  . Diabetes Neg Hx   . Cancer Neg Hx     DRUG ALLERGIES:   Allergies  Allergen Reactions  . Codeine Other (See Comments)    Pt states that this medication makes her pass out.   . Levofloxacin Other (See Comments)    Reaction:  Redness   . Sulfa Antibiotics Swelling    REVIEW OF SYSTEMS:  REVIEW OF SYSTEMS:  CONSTITUTIONAL: Denies fevers, chills,Positive fatigue, weakness.  EYES: Denies blurred vision, double vision, or eye pain.  EARS, NOSE, THROAT: Denies tinnitus, ear pain, hearing loss.  RESPIRATORY: Positive cough, shortness of breath, denies wheezing  CARDIOVASCULAR: Denies chest pain, palpitations, edema.  GASTROINTESTINAL: Denies nausea, vomiting, positive diarrhea, denies abdominal pain.  GENITOURINARY: Denies dysuria, hematuria.  ENDOCRINE: Denies nocturia or thyroid problems. HEMATOLOGIC AND LYMPHATIC: Denies easy bruising or bleeding.  SKIN: Denies rash or lesions.  MUSCULOSKELETAL: Denies pain in neck, back, shoulder, knees, hips, or further arthritic symptoms.  NEUROLOGIC: Denies paralysis, paresthesias.  PSYCHIATRIC: Denies anxiety or depressive symptoms. Otherwise full review of systems performed by me is negative.   MEDICATIONS AT HOME:   Prior to Admission medications   Medication Sig Start Date End Date Taking? Authorizing Provider  aspirin 325 MG tablet  Take 325 mg by mouth 2 (two) times daily.     Historical Provider, MD  atorvastatin (LIPITOR) 40 MG tablet Take 1 tablet (40 mg total) by mouth at bedtime. 05/09/16   Fritzi Mandes, MD  azithromycin (ZITHROMAX) 250 MG tablet Take as directed 05/09/16   Fritzi Mandes, MD  insulin aspart (NOVOLOG) 100 UNIT/ML injection Inject 0-20 Units into the skin 3 (three) times daily with meals. 05/09/16   Fritzi Mandes, MD  insulin detemir (LEVEMIR) 100 UNIT/ML injection Inject 0.25 mLs (25 Units total) into the skin at bedtime. 05/09/16   Fritzi Mandes, MD  Ipratropium-Albuterol (COMBIVENT) 20-100 MCG/ACT AERS respimat Inhale 1 puff into the lungs every 6 (six) hours as needed for wheezing. 05/09/16   Fritzi Mandes, MD  polyethylene glycol (MIRALAX / GLYCOLAX) packet Take 17 g by mouth daily as needed for mild constipation. 05/09/16   Fritzi Mandes, MD  predniSONE (DELTASONE) 10 MG tablet Take 50 mg daily and taper by 10 mg daily then stop 05/09/16   Fritzi Mandes, MD  tiotropium (SPIRIVA) 18 MCG inhalation capsule Place 1 capsule (18 mcg total) into inhaler and inhale daily. 05/09/16   Fritzi Mandes, MD      VITAL SIGNS:  Blood pressure 130/74, pulse 96, temperature 98.6 F (37 C), temperature source Oral, resp. rate (!) 24, height 5\' 2"  (1.575 m), weight 88.9 kg (196 lb), SpO2 98 %.  PHYSICAL EXAMINATION:  VITAL SIGNS: Vitals:   05/31/16 1905 05/31/16 1930  BP: 112/67 130/74  Pulse: 95 96  Resp: (!) 21 (!) 24  Temp:     GENERAL:75 y.o.female currently in no acute distress.  HEAD: Normocephalic, atraumatic.  EYES: Pupils equal, round, reactive to light. Extraocular muscles intact. No scleral icterus.  MOUTH: Moist mucosal membrane. Dentition intact. No abscess noted.  EAR, NOSE, THROAT: Clear without exudates. No external lesions.  NECK: Supple. No thyromegaly. No nodules. No JVD.  PULMONARY: Coarse rhonchi with scattered expiratory wheeze. No use of accessory muscles, Good respiratory effort. good air entry bilaterally CHEST:  Nontender to palpation.  CARDIOVASCULAR: S1 and S2. Regular rate and rhythm. No murmurs, rubs, or gallops. No edema. Pedal pulses 2+ bilaterally.  GASTROINTESTINAL: Soft, nontender, nondistended. No masses. Positive bowel sounds. No hepatosplenomegaly.  MUSCULOSKELETAL: No swelling, clubbing, or edema. Range of motion full in all extremities.  NEUROLOGIC: Cranial nerves II through XII are intact. No gross focal neurological deficits. Sensation intact. Reflexes intact.  SKIN: No ulceration, lesions, rashes, or cyanosis. Skin warm and dry. Turgor intact.  PSYCHIATRIC: Mood, affect within normal limits. The patient is awake, alert and oriented x 2. Insight, judgment intact.    LABORATORY PANEL:   CBC  Recent Labs Lab 05/31/16 1355  WBC 7.4  HGB 15.9  HCT 44.9  PLT 240   ------------------------------------------------------------------------------------------------------------------  Chemistries   Recent Labs Lab 05/31/16 1355  NA 143  K 4.5  CL  100*  CO2 36*  GLUCOSE 113*  BUN 17  CREATININE 0.83  CALCIUM 9.3  AST 19  ALT 12*  ALKPHOS 67  BILITOT 1.2   ------------------------------------------------------------------------------------------------------------------  Cardiac Enzymes  Recent Labs Lab 05/31/16 1355  TROPONINI <0.03   ------------------------------------------------------------------------------------------------------------------  RADIOLOGY:  Dg Chest 2 View  Result Date: 05/31/2016 CLINICAL DATA:  Acute shortness of breath. Evaluate for pneumonia. History of COPD. EXAM: CHEST  2 VIEW COMPARISON:  05/07/2016; 11/02/2015; 09/13/2013; chest CT- 02/02/2016 11/02/2015 FINDINGS: Grossly unchanged cardiac silhouette and mediastinal contours with right upper lung volume loss and associated chronic mild elevation of the right hemidiaphragm and deviation of the cardiomediastinal structures to the right. Minimal amount of atherosclerotic plaque within the  thoracic aorta. There is persistent thickening of the right paratracheal stripe secondary to prominent vasculature as demonstrated on prior chest CT. No focal airspace opacities. No pleural effusion or pneumothorax. No evidence of edema. No acute osseus abnormalities. Surgical clips overlie the upper outer quadrant of the right breast. IMPRESSION: 1. Similar findings of right-sided volume loss without definitive superimposed acute cardiopulmonary disease. 2. Note, the known right apical spiculated mass is not apparent on this radiographic series. Electronically Signed   By: Sandi Mariscal M.D.   On: 05/31/2016 14:42    EKG:   Orders placed or performed during the hospital encounter of 05/31/16  . ED EKG 12-Lead  . ED EKG 12-Lead    IMPRESSION AND PLAN:   75 year old Caucasian female history of COPD oxygen requiring per documentation 3 L nasal cannula presenting with weakness and diarrhea shortness of breath  1. Acute on chronic respiratory failure with hypoxia: COPD exacerbation breathing treatments, steroids 2. Diarrhea: C. difficile ordered emergency department continue with enteric precautions 3. Generalized weakness physical therapy evaluation 4. Type 2 diabetes insulin requiring continue basal insulin at sliding coverage 5. Hyperlipidemia unspecified: Lipitor   All the records are reviewed and case discussed with ED provider. Management plans discussed with the patient, family and they are in agreement.  CODE STATUS: Full  TOTAL TIME TAKING CARE OF THIS PATIENT: 33 minutes.    Cleve Paolillo,  Karenann Cai.D on 05/31/2016 at 7:59 PM  Between 7am to 6pm - Pager - [phone]  After 6pm: House Pager: - [phone]  [facility]  Office  [phone]  CC: Primary care physician; No PCP Per Patient

## 2016-05-31 NOTE — Progress Notes (Signed)
LCSW reviewed current notes. LCSW will follow up with APS- weekend worker to see what the concerns are for this patient. Unable to reach at this time. Advising EDP or ED nurse to put in a SW consult.  [employer] LCSW [phone]

## 2016-06-01 LAB — GLUCOSE, CAPILLARY
GLUCOSE-CAPILLARY: 334 mg/dL — AB (ref 65–99)
Glucose-Capillary: 285 mg/dL — ABNORMAL HIGH (ref 65–99)
Glucose-Capillary: 231 mg/dL — ABNORMAL HIGH (ref 65–99)
Glucose-Capillary: 126 mg/dL — ABNORMAL HIGH (ref 65–99)

## 2016-06-01 NOTE — Progress Notes (Signed)
[facility] NAME: Kaitlin Henderson    MR#:  [id number]  DATE OF BIRTH:  1940-10-07  SUBJECTIVE:  CHIEF COMPLAINT:   Chief Complaint  Patient presents with  . Weakness  The patient is 75 year old Caucasian female with past medical history significant with history of chronic respiratory failure, COPD, diabetes mellitus type 2, who presents to the hospital with complaints of weakness. Apparently patient was at her care facility where she was found to be covered in stool and urine. She has been battling diarrhea for the past one week, multiple bouts of loose watery stools. On arrival to the hospital, she complained of generalized weakness, fatigue. She feels poorly today, complains of some shortness of breath, denies any significant change from a few weeks ago. She is to be seen by physical therapist, was not able to participate in physical therapy today  Review of Systems  Constitutional: Positive for malaise/fatigue. Negative for chills, fever and weight loss.  HENT: Negative for congestion.   Eyes: Negative for blurred vision and double vision.  Respiratory: Negative for cough, sputum production, shortness of breath and wheezing.   Cardiovascular: Negative for chest pain, palpitations, orthopnea, leg swelling and PND.  Gastrointestinal: Negative for abdominal pain, blood in stool, constipation, diarrhea, nausea and vomiting.  Genitourinary: Negative for dysuria, frequency, hematuria and urgency.  Musculoskeletal: Negative for falls.  Neurological: Positive for weakness. Negative for dizziness, tremors, focal weakness and headaches.  Endo/Heme/Allergies: Does not bruise/bleed easily.  Psychiatric/Behavioral: Negative for depression. The patient does not have insomnia.     VITAL SIGNS: Blood pressure (!) 104/50, pulse (!) 102, temperature 98.8 F (37.1 C), temperature source Oral, resp. rate 18, height 5\' 2"  (1.575 m), weight 86.7  kg (191 lb 1.6 oz), SpO2 90 %.  PHYSICAL EXAMINATION:   GENERAL:  75 y.o.-year-old patient lying in the bed with no acute distress. Intermittent nonproductive cough EYES: Pupils equal, round, reactive to light and accommodation. No scleral icterus. Extraocular muscles intact.  HEENT: Head atraumatic, normocephalic. Oropharynx and nasopharynx clear.  NECK:  Supple, no jugular venous distention. No thyroid enlargement, no tenderness.  LUNGS: Diminished breath sounds bilaterally, no wheezing, rales,rhonchi or crepitation. No use of accessory muscles of respiration, unless with exertion and longer speech.  CARDIOVASCULAR: S1, S2 normal. No murmurs, rubs, or gallops.  ABDOMEN: Soft, nontender, nondistended. Bowel sounds present. No organomegaly or mass.  EXTREMITIES: No pedal edema, cyanosis, or clubbing.  NEUROLOGIC: Cranial nerves II through XII are intact. Muscle strength 5/5 in all extremities. Sensation intact. Gait not checked.  PSYCHIATRIC: The patient is alert and oriented x 3.  SKIN: No obvious rash, lesion, or ulcer.   ORDERS/RESULTS REVIEWED:   CBC  Recent Labs Lab 05/31/16 1355  WBC 7.4  HGB 15.9  HCT 44.9  PLT 240  MCV 94.4  MCH 33.3  MCHC 35.3  RDW 14.0  LYMPHSABS 2.4  MONOABS 0.6  EOSABS 0.3  BASOSABS 0.0   ------------------------------------------------------------------------------------------------------------------  Chemistries   Recent Labs Lab 05/31/16 1355  NA 143  K 4.5  CL 100*  CO2 36*  GLUCOSE 113*  BUN 17  CREATININE 0.83  CALCIUM 9.3  AST 19  ALT 12*  ALKPHOS 67  BILITOT 1.2   ------------------------------------------------------------------------------------------------------------------ estimated creatinine clearance is 58.9 mL/min (by C-G formula based on SCr of 0.83 mg/dL). ------------------------------------------------------------------------------------------------------------------ No results for input(s): TSH, T4TOTAL,  T3FREE, THYROIDAB in the last 72 hours.  Invalid input(s): FREET3  Cardiac Enzymes  Recent Labs  Lab 05/31/16 1355  TROPONINI <0.03   ------------------------------------------------------------------------------------------------------------------ Invalid input(s): POCBNP ---------------------------------------------------------------------------------------------------------------  RADIOLOGY: Dg Chest 2 View  Result Date: 05/31/2016 CLINICAL DATA:  Acute shortness of breath. Evaluate for pneumonia. History of COPD. EXAM: CHEST  2 VIEW COMPARISON:  05/07/2016; 11/02/2015; 09/13/2013; chest CT- 02/02/2016 11/02/2015 FINDINGS: Grossly unchanged cardiac silhouette and mediastinal contours with right upper lung volume loss and associated chronic mild elevation of the right hemidiaphragm and deviation of the cardiomediastinal structures to the right. Minimal amount of atherosclerotic plaque within the thoracic aorta. There is persistent thickening of the right paratracheal stripe secondary to prominent vasculature as demonstrated on prior chest CT. No focal airspace opacities. No pleural effusion or pneumothorax. No evidence of edema. No acute osseus abnormalities. Surgical clips overlie the upper outer quadrant of the right breast. IMPRESSION: 1. Similar findings of right-sided volume loss without definitive superimposed acute cardiopulmonary disease. 2. Note, the known right apical spiculated mass is not apparent on this radiographic series. Electronically Signed   By: Sandi Mariscal M.D.   On: 05/31/2016 14:42    EKG:  Orders placed or performed during the hospital encounter of 05/31/16  . ED EKG 12-Lead  . ED EKG 12-Lead    ASSESSMENT AND PLAN:  Active Problems:   Acute on chronic respiratory failure with hypoxia (HCC) #1. Generalized weakness, likely due to recent gastrointestinal infection/diarrhea, patient could not participate in physical therapy today, will determine disposition after  physical therapy sees patient #2. Acute on Chronic respiratory failure with hypoxia and hypercapnia, continue intravenous steroids, nebulizing therapy, get sputum cultures if possible, continue oxygen therapy, follow clinically #3. Diarrhea, resolved now, C. difficile negative, get norovirus  as well as enteric pathogen testing if diarrhea recurs #4. Diabetes mellitus type 2, continue insulin, sliding scale, blood glucose levels are ranging between 200s to 300s, likely due to steroids  Management plans discussed with the patient, family and they are in agreement.   DRUG ALLERGIES:  Allergies  Allergen Reactions  . Codeine Other (See Comments)    Pt states that this medication makes her pass out.   . Levofloxacin Other (See Comments)    Reaction:  Redness   . Sulfa Antibiotics Swelling    CODE STATUS:     Code Status Orders        Start     Ordered   05/31/16 1941  Full code  Continuous     05/31/16 1941    Code Status History    Date Active Date Inactive Code Status Order ID Comments User Context   05/07/2016  7:59 PM 05/09/2016  5:42 PM Full Code [id number]  Nicholes Mango, MD Inpatient   02/20/2016 12:57 AM 02/23/2016  7:10 PM Full Code [id number]  Alesia Richards, MD ED   12/12/2015 11:19 AM 12/14/2015  5:43 PM Partial Code [id number]  Hillary Bow, MD ED   11/08/2015 10:32 PM 11/09/2015  9:53 PM Full Code [id number]  Lance Coon, MD Inpatient   11/04/2015 10:59 AM 11/08/2015 10:32 PM DNR [id number]  Laverle Hobby, MD Inpatient   11/02/2015  8:23 PM 11/04/2015 10:59 AM Full Code [id number]  Dustin Flock, MD Inpatient   01/17/2012  8:32 PM 01/28/2012  4:49 PM DNR [id number]  Mila Palmer, RN Inpatient    Advance Directive Documentation   Flowsheet Row Most Recent Value  Type of Advance Directive  Healthcare Power of Attorney  Pre-existing out of facility DNR order (yellow form or pink MOST form)  No data  "MOST" Form in Place?  No data      TOTAL TIME TAKING CARE OF THIS PATIENT:  40 minutes.    Theodoro Grist M.D on 06/01/2016 at 12:13 PM  Between 7am to 6pm - Pager - [phone]  After 6pm go to [email or web address] - password [email or web address] [facility]  [facility]  Office  [phone]  CC: Primary care physician; No PCP Per Patient

## 2016-06-01 NOTE — Progress Notes (Signed)
PT Cancellation Note  Patient Details Name: Kaitlin Henderson MRN: [id number] DOB: 25-May-1941   Cancelled Treatment:    Reason Eval/Treat Not Completed: Other (comment) (Pt too tired for attempting evaluation).  Will try to see tomorrow.   Ramond Dial 06/01/2016, 11:19 AM    Mee Hives, PT MS Acute Rehab Dept. Number: [place] and [place]

## 2016-06-01 NOTE — Clinical Social Work Note (Signed)
Patient requested a visit from [place]. CSW visited bedside and introduced self. The patient indicated that she wanted to discuss how to gain private in-home care at [facility]. The CSW advised the patient that she was free to pay out of pocket for care, but unless PT recommended home health, then Medicare would not cover. The CSW advised the patient to reconsider applying for Medicaid, citing that it could help with outpatient visits and medication. The patient became tearful and reported that she refuses to go to a SNF even for STR, and that she does not want to be "handicapped". The CSW provided emotional support through brief CBT intervention including assiting the patient with intrusive thought processing and rumination.  The patient also reported that her boyfriend Randall Hiss would like to speak to the CSW, and she gave verbal permission to discuss her care with him. CSW attempted to reach [place] by phone with no answer and a full voice mail. CSW will con't to attempt.  Santiago Bumpers, MSW, LCSW-A [phone]

## 2016-06-02 ENCOUNTER — Inpatient Hospital Stay: Payer: Medicare Other

## 2016-06-02 DIAGNOSIS — E119 Type 2 diabetes mellitus without complications: Secondary | ICD-10-CM

## 2016-06-02 DIAGNOSIS — R531 Weakness: Secondary | ICD-10-CM

## 2016-06-02 DIAGNOSIS — R197 Diarrhea, unspecified: Secondary | ICD-10-CM

## 2016-06-02 DIAGNOSIS — M25559 Pain in unspecified hip: Secondary | ICD-10-CM

## 2016-06-02 DIAGNOSIS — M545 Low back pain, unspecified: Secondary | ICD-10-CM

## 2016-06-02 LAB — GLUCOSE, CAPILLARY
Glucose-Capillary: 265 mg/dL — ABNORMAL HIGH (ref 65–99)
Glucose-Capillary: 275 mg/dL — ABNORMAL HIGH (ref 65–99)
Glucose-Capillary: 174 mg/dL — ABNORMAL HIGH (ref 65–99)

## 2016-06-02 MED ORDER — LIDOCAINE-PRILOCAINE 2.5-2.5 % EX CREA: 1.0000 "application " | TOPICAL_CREAM | CUTANEOUS | 0 refills | Status: AC | PRN

## 2016-06-02 MED ORDER — LIDOCAINE 5 % EX PTCH
1.0000 | MEDICATED_PATCH | CUTANEOUS | Status: DC
  Administered 2016-06-02: 1 via TRANSDERMAL
  Filled 2016-06-02: qty 1

## 2016-06-02 MED ORDER — FUROSEMIDE 20 MG PO TABS
20.0000 mg | ORAL_TABLET | Freq: Once | ORAL | Status: AC
  Administered 2016-06-02: 20 mg via ORAL
  Filled 2016-06-02: qty 1

## 2016-06-02 NOTE — Care Management Important Message (Signed)
Important Message  Patient Details  Name: Kaitlin Henderson MRN: [id number] Date of Birth: [birth year]   Medicare Important Message Given:  Yes    Chun Sellen A, RN 06/02/2016, 3:48 PM

## 2016-06-02 NOTE — Clinical Social Work Note (Signed)
CSW attempted to contact the patient's boyfriend to discuss his request to speak with CSW. Kaitlin Henderson did not answer and has a full voicemail. CSW will con't to attempt.  Santiago Bumpers, MSW, LCSW-A [phone]

## 2016-06-02 NOTE — Clinical Social Work Note (Signed)
Per chart review, the patient has an APS referral placed by Gevena Barre from [facility].  Santiago Bumpers, MSW, LCSW-A [phone]

## 2016-06-02 NOTE — Evaluation (Signed)
Physical Therapy Evaluation Patient Details Name: Kaitlin Henderson MRN: [id number] DOB: 1941/06/05 Today's Date: 06/02/2016   History of Present Illness  Pt is a 75 y.o. female presenting to hospital with SOB.  Pt admitted with acute on chronic hypoxic respiratory failure secondary to COPD exacerbation. She is apparently nearly w/c bound at her independent living facility and uses O2 24/7.  PMH includes R mastectomy, COPD, CHF, NSTEMI, DM, htn, HLD.  Clinical Impression  Pt with somewhat scattered discussion t/o PT exam but it does appear that she almost exclusively uses the electric w/c in and out of her apartment and generally is able to do most of what she needs.  She is on O2 24/7 and despite only limited activity during PT exam (and being on O2) she has significant fatigue and was very tired upon getting back to bed (from transfer to/from recliner).  Pt overall is limited but does not seem to be too far from her baseline.      Follow Up Recommendations Home health PT    Equipment Recommendations       Recommendations for Other Services       Precautions / Restrictions Precautions Precautions: Fall Restrictions Weight Bearing Restrictions: No      Mobility  Bed Mobility Overal bed mobility: Needs Assistance Bed Mobility: Supine to Sit;Sit to Supine     Supine to sit: Min assist Sit to supine: Mod assist   General bed mobility comments: Pt with definite need of UEs (rails, PT HHA) for mobility  Transfers Overall transfer level: Needs assistance Equipment used: Rolling walker (2 wheeled) Transfers: Sit to/from Stand Sit to Stand: Min assist;Min guard         General transfer comment: Pt able to get to standing w/o excessive cuing or assist but is hesitant to get up and anxious about doing too much.   Ambulation/Gait Ambulation/Gait assistance: Min assist;Min guard Ambulation Distance (Feet): 3 Feet Assistive device: Rolling walker (2 wheeled)       General  Gait Details: Pt able to take a few small, shuffling steps along EOB with walker.  She does not have any stagger steps or LOBs, but generally is impulsive and has some related unsteadiness.  She is able to get to/from recliner (simulating w/c) w/o excessive assist  Stairs            Wheelchair Mobility    Modified [name] (Stroke Patients Only)       Balance Overall balance assessment: Modified Independent (definite need of AD in standing to maintain balance)                                           Pertinent Vitals/Pain      Home Living Family/patient expects to be discharged to::  (pt wanting to go back to independent living)                 Additional Comments: uses electric w/c exclusively outside her independent living apartment    Prior Function Level of Independence: Independent with assistive device(s)         Comments: Transfers to electric w/c for mobility but she reports she can walk short distances holding onto items     Hand Dominance        Extremity/Trunk Assessment   Upper Extremity Assessment:  (R shoulder very limited (chronic), L UE grossly 4-/5)  Lower Extremity Assessment: Generalized weakness (grossly 3+ to 4-/5 t/o)         Communication   Communication: No difficulties  Cognition Arousal/Alertness: Awake/alert Behavior During Therapy: Anxious;Restless;Impulsive Overall Cognitive Status: Difficult to assess                      General Comments      Exercises     Assessment/Plan    PT Assessment Patient needs continued PT services  PT Problem List Decreased strength;Decreased activity tolerance;Decreased balance;Decreased mobility;Decreased safety awareness;Decreased knowledge of use of DME;Decreased cognition;Decreased knowledge of precautions          PT Treatment Interventions DME instruction;Gait training;Functional mobility training;Therapeutic activities;Therapeutic  exercise;Balance training;Cognitive remediation;Patient/family education    PT Goals (Current goals can be found in the Care Plan section)  Acute Rehab PT Goals Patient Stated Goal: get more aide time in her apartment PT Goal Formulation: With patient Time For Goal Achievement: 06/16/16 Potential to Achieve Goals: Fair    Frequency Min 2X/week   Barriers to discharge        Co-evaluation               End of Session Equipment Utilized During Treatment: Gait belt Activity Tolerance: Patient limited by fatigue Patient left: with call bell/phone within reach;with bed alarm set Nurse Communication: Mobility status         Time: 1005-1026 PT Time Calculation (min) (ACUTE ONLY): 21 min   Charges:   PT Evaluation $PT Eval Low Complexity: 1 Procedure     PT G CodesKreg Shropshire, DPT 06/02/2016, 2:30 PM

## 2016-06-02 NOTE — Care Management Note (Addendum)
Case Management Note  Patient Details  Name: Kaitlin Henderson MRN: [id number] Date of Birth: [birth year]  Subjective/Objective:         Discussed discharge planning with [name] who is apparently focused on an emotional conflict with a friend. A referral for home health PT, OT, Aide, Respiratory care, was faxed to [facility].  [name] reports severe financial issues at this time. CSW provided [name] with the phone number of DSS where she can request that a Medicaid assistant come to her home to assist her with completing an application for Medicaid because she is wheelchair bound. [name] reports no one to transport her home so a Medical Necessity was completed for EMS transportation.  PCP is DR Otelia Limes at [facility].          Action/Plan:   Expected Discharge Date:                  Expected Discharge Plan:     In-House Referral:     Discharge planning Services     Post Acute Care Choice:    Choice offered to:     DME Arranged:    DME Agency:     HH Arranged:    HH Agency:     Status of Service:     If discussed at [employer] of Stay Meetings, dates discussed:    Additional Comments:  Evelyn Aguinaldo A, RN 06/02/2016, 3:11 PM

## 2016-06-02 NOTE — Discharge Summary (Signed)
[facility] NAME: Kaitlin Henderson    MR#:  [id number]  DATE OF BIRTH:  Feb 10, 1941  DATE OF ADMISSION:  05/31/2016 ADMITTING PHYSICIAN: Lytle Butte, MD  DATE OF DISCHARGE: No discharge date for patient encounter.  PRIMARY CARE PHYSICIAN: No PCP Per Patient     ADMISSION DIAGNOSIS:  weakness  DISCHARGE DIAGNOSIS:  Principal Problem:   Acute on chronic respiratory failure with hypoxia (HCC) Active Problems:   Generalized weakness   Lower back pain   Hip pain   Diabetes ([place])   Diarrhea   SECONDARY DIAGNOSIS:   Past Medical History:  Diagnosis Date  . Anginal pain ([place])   . Aphthous stomatitis   . APHTHOUS STOMATITIS   . Breast cancer, right breast ([place]) 1998   s/p chemo & XRT; right mastectomy  . Breast cancer, right breast ([place]) 1998   s/p chemo & XRT, right mastectomy  . C O P D    chronic O2 3LPM [place]  . CHF (congestive heart failure) ([place])   . COPD (chronic obstructive pulmonary disease) ([place])   . Coronary artery disease   . CORONARY ARTERY DISEASE   . Depression   . DEPRESSION    started sertraline 09/2010  . Diabetes mellitus, type 2 ([place]) 03/2010  . DIABETES, TYPE 2 dx 03/2010  . Essential tremor   . GERD (gastroesophageal reflux disease)   . Hyperlipemia   . HYPERLIPIDEMIA   . Hypothyroidism   . NSTEMI (non-ST elevated myocardial infarction) ([place]) 01/2012  . NSTEMI (non-ST elevated myocardial infarction) ([place]) 01/2012   med mgmt  . Obesity   . OBESITY     .pro HOSPITAL COURSE:   The patient is 75 year old Caucasian female with past medical history significant with history of chronic respiratory failure, COPD, diabetes mellitus type 2, who presents to the hospital with complaints of weakness. Apparently patient was at her care facility where she was found to be covered in stool and urine. She has been battling diarrhea for the past one week, multiple bouts of loose watery stools. On arrival to  the hospital, she complained of generalized weakness, fatigue,  some shortness of breath, but denied significant change from a few weeks ago. She is to be seen by physical therapist, recommended home health services. Patient was felt to be stable to be discharged back home today.  Discussion by problem: #1. Generalized weakness, likely due to recent gastrointestinal infection/diarrhea, the patient was recommended home health serviced by physical therapist today, dc home today #2. Acute on Chronic respiratory failure with hypoxia and hypercapnia, the patient received intravenous steroids, nebulizing therapy while in the hospital, improved clinically, sputum cultures are pending, but patient did not receive antibiotic therapy during this admission as she was afebrile, her WBC count was normal, she is to continue oxygen therapy, O2 sats were 93% on 2 liters of O2 per [place], improved clinically #3. Diarrhea, resolved now, C. difficile negative #4. Diabetes mellitus type 2, continue insulin home doses, sliding scale, blood glucose levels were ranging between 200s to 300s, due to steroids, now stopped  DISCHARGE CONDITIONS:   stable  CONSULTS OBTAINED:  Treatment Team:  Lytle Butte, MD  DRUG ALLERGIES:   Allergies  Allergen Reactions  . Codeine Other (See Comments)    Pt states that this medication makes her pass out.   . Levofloxacin Other (See Comments)    Reaction:  Redness   . Sulfa Antibiotics Swelling  DISCHARGE MEDICATIONS:   Current Discharge Medication List    START taking these medications   Details  lidocaine-prilocaine (EMLA) cream Apply 1 application topically as needed. Qty: 30 g, Refills: 0      CONTINUE these medications which have NOT CHANGED   Details  aspirin 325 MG tablet Take 325 mg by mouth 2 (two) times daily.     atorvastatin (LIPITOR) 40 MG tablet Take 1 tablet (40 mg total) by mouth at bedtime. Qty: 30 tablet, Refills: 0    azithromycin (ZITHROMAX) 250  MG tablet Take as directed Qty: 3 each, Refills: 0    insulin aspart (NOVOLOG) 100 UNIT/ML injection Inject 0-20 Units into the skin 3 (three) times daily with meals. Qty: 10 mL, Refills: 11    insulin detemir (LEVEMIR) 100 UNIT/ML injection Inject 0.25 mLs (25 Units total) into the skin at bedtime. Qty: 10 mL, Refills: 11    Ipratropium-Albuterol (COMBIVENT) 20-100 MCG/ACT AERS respimat Inhale 1 puff into the lungs every 6 (six) hours as needed for wheezing. Qty: 1 Inhaler, Refills: 1    polyethylene glycol (MIRALAX / GLYCOLAX) packet Take 17 g by mouth daily as needed for mild constipation. Qty: 14 each, Refills: 0    predniSONE (DELTASONE) 10 MG tablet Take 50 mg daily and taper by 10 mg daily then stop Qty: 15 tablet, Refills: 0    tiotropium (SPIRIVA) 18 MCG inhalation capsule Place 1 capsule (18 mcg total) into inhaler and inhale daily. Qty: 30 capsule, Refills: 12         DISCHARGE INSTRUCTIONS:    The patient is to follow up with PCP within 1 week after discharge   If you experience worsening of your admission symptoms, develop shortness of breath, life threatening emergency, suicidal or homicidal thoughts you must seek medical attention immediately by calling 911 or calling your MD immediately  if symptoms less severe.  You Must read complete instructions/literature along with all the possible adverse reactions/side effects for all the Medicines you take and that have been prescribed to you. Take any new Medicines after you have completely understood and accept all the possible adverse reactions/side effects.   Please note  You were cared for by a hospitalist during your hospital stay. If you have any questions about your discharge medications or the care you received while you were in the hospital after you are discharged, you can call the unit and asked to speak with the hospitalist on call if the hospitalist that took care of you is not available. Once you are  discharged, your primary care physician will handle any further medical issues. Please note that NO REFILLS for any discharge medications will be authorized once you are discharged, as it is imperative that you return to your primary care physician (or establish a relationship with a primary care physician if you do not have one) for your aftercare needs so that they can reassess your need for medications and monitor your lab values.    Today   CHIEF COMPLAINT:   Chief Complaint  Patient presents with  . Weakness    HISTORY OF PRESENT ILLNESS:  Eri Flow  is a 75 y.o. female with a known history of chronic respiratory failure, COPD, diabetes mellitus type 2, who presents to the hospital with complaints of weakness. Apparently patient was at her care facility where she was found to be covered in stool and urine. She has been battling diarrhea for the past one week, multiple bouts of loose watery stools. On  arrival to the hospital, she complained of generalized weakness, fatigue,  some shortness of breath, but denied significant change from a few weeks ago. She is to be seen by physical therapist, recommended home health services. Patient was felt to be stable to be discharged back home today.  Discussion by problem: #1. Generalized weakness, likely due to recent gastrointestinal infection/diarrhea, the patient was recommended home health serviced by physical therapist today, dc home today #2. Acute on Chronic respiratory failure with hypoxia and hypercapnia, the patient received intravenous steroids, nebulizing therapy while in the hospital, improved clinically, sputum cultures are pending, but patient did not receive antibiotic therapy during this admission as she was afebrile, her WBC count was normal, she is to continue oxygen therapy, O2 sats were 93% on 2 liters of O2 per [place], improved clinically #3. Diarrhea, resolved now, C. difficile negative #4. Diabetes mellitus type 2, continue  insulin home doses, sliding scale, blood glucose levels were ranging between 200s to 300s, due to steroids, now stopped     VITAL SIGNS:  Blood pressure 123/73, pulse 92, temperature 97.4 F (36.3 C), temperature source Oral, resp. rate 16, height 5\' 2"  (1.575 m), weight 86.7 kg (191 lb 1.6 oz), SpO2 93 %.  I/O:   Intake/Output Summary (Last 24 hours) at 06/02/16 1431 Last data filed at 06/02/16 1334  Gross per 24 hour  Intake              600 ml  Output                0 ml  Net              600 ml    PHYSICAL EXAMINATION:  GENERAL:  75 y.o.-year-old patient lying in the bed with no acute distress.  EYES: Pupils equal, round, reactive to light and accommodation. No scleral icterus. Extraocular muscles intact.  HEENT: Head atraumatic, normocephalic. Oropharynx and nasopharynx clear.  NECK:  Supple, no jugular venous distention. No thyroid enlargement, no tenderness.  LUNGS: Normal breath sounds bilaterally, no wheezing, rales,rhonchi or crepitation. No use of accessory muscles of respiration.  CARDIOVASCULAR: S1, S2 normal. No murmurs, rubs, or gallops.  ABDOMEN: Soft, non-tender, non-distended. Bowel sounds present. No organomegaly or mass.  EXTREMITIES: No pedal edema, cyanosis, or clubbing.  NEUROLOGIC: Cranial nerves II through XII are intact. Muscle strength 5/5 in all extremities. Sensation intact. Gait not checked.  PSYCHIATRIC: The patient is alert and oriented x 3.  SKIN: No obvious rash, lesion, or ulcer.   DATA REVIEW:   CBC  Recent Labs Lab 05/31/16 1355  WBC 7.4  HGB 15.9  HCT 44.9  PLT 240    Chemistries   Recent Labs Lab 05/31/16 1355  NA 143  K 4.5  CL 100*  CO2 36*  GLUCOSE 113*  BUN 17  CREATININE 0.83  CALCIUM 9.3  AST 19  ALT 12*  ALKPHOS 67  BILITOT 1.2    Cardiac Enzymes  Recent Labs Lab 05/31/16 1355  TROPONINI <0.03    Microbiology Results  Results for orders placed or performed during the hospital encounter of 05/31/16   Blood Culture (routine x 2)     Status: None (Preliminary result)   Collection Time: 05/31/16  1:56 PM  Result Value Ref Range Status   Specimen Description BLOOD LEFT AC  Final   Special Requests BOTTLES DRAWN AEROBIC AND ANAEROBIC 10CC  Final   Culture NO GROWTH 2 DAYS  Final   Report Status PENDING  Incomplete  C difficile quick scan w PCR reflex     Status: None   Collection Time: 05/31/16  3:31 PM  Result Value Ref Range Status   C Diff antigen NEGATIVE NEGATIVE Final   C Diff toxin NEGATIVE NEGATIVE Final   C Diff interpretation No C. difficile detected.  Final  MRSA PCR Screening     Status: None   Collection Time: 05/31/16  9:20 PM  Result Value Ref Range Status   MRSA by PCR NEGATIVE NEGATIVE Final    Comment:        The GeneXpert MRSA Assay (FDA approved for NASAL specimens only), is one component of a comprehensive MRSA colonization surveillance program. It is not intended to diagnose MRSA infection nor to guide or monitor treatment for MRSA infections.     RADIOLOGY:  Dg Chest 2 View  Result Date: 05/31/2016 CLINICAL DATA:  Acute shortness of breath. Evaluate for pneumonia. History of COPD. EXAM: CHEST  2 VIEW COMPARISON:  05/07/2016; 11/02/2015; 09/13/2013; chest CT- 02/02/2016 11/02/2015 FINDINGS: Grossly unchanged cardiac silhouette and mediastinal contours with right upper lung volume loss and associated chronic mild elevation of the right hemidiaphragm and deviation of the cardiomediastinal structures to the right. Minimal amount of atherosclerotic plaque within the thoracic aorta. There is persistent thickening of the right paratracheal stripe secondary to prominent vasculature as demonstrated on prior chest CT. No focal airspace opacities. No pleural effusion or pneumothorax. No evidence of edema. No acute osseus abnormalities. Surgical clips overlie the upper outer quadrant of the right breast. IMPRESSION: 1. Similar findings of right-sided volume loss without  definitive superimposed acute cardiopulmonary disease. 2. Note, the known right apical spiculated mass is not apparent on this radiographic series. Electronically Signed   By: Sandi Mariscal M.D.   On: 05/31/2016 14:42   Dg Hip Unilat With Pelvis 2-3 Views Left  Result Date: 06/02/2016 CLINICAL DATA:  Recent fall, pain left lower back into left hip area. Pt unable to stand or bear wt. EXAM: DG HIP (WITH OR WITHOUT PELVIS) 2-3V LEFT COMPARISON:  None. FINDINGS: Single view of the pelvis and two views of the left hip are provided. Osseous alignment is normal. No osseous fracture or dislocation identified. No acute or suspicious osseous lesion. Mild degenerative change noted in the lower lumbar spine. Soft tissues about the pelvis and left hip are unremarkable. IMPRESSION: No acute findings. No osseous fracture or dislocation seen. Mild degenerative change in the lower lumbar spine. Electronically Signed   By: Franki Cabot M.D.   On: 06/02/2016 09:08    EKG:   Orders placed or performed during the hospital encounter of 05/31/16  . ED EKG 12-Lead  . ED EKG 12-Lead      Management plans discussed with the patient, family and they are in agreement.  CODE STATUS:     Code Status Orders        Start     Ordered   05/31/16 1941  Full code  Continuous     05/31/16 1941    Code Status History    Date Active Date Inactive Code Status Order ID Comments User Context   05/07/2016  7:59 PM 05/09/2016  5:42 PM Full Code [id number]  Nicholes Mango, MD Inpatient   02/20/2016 12:57 AM 02/23/2016  7:10 PM Full Code [id number]  Alesia Richards, MD ED   12/12/2015 11:19 AM 12/14/2015  5:43 PM Partial Code [id number]  Hillary Bow, MD ED   11/08/2015 10:32 PM 11/09/2015  9:53 PM Full Code [id number]  Lance Coon, MD Inpatient   11/04/2015 10:59 AM 11/08/2015 10:32 PM DNR [id number]  Laverle Hobby, MD Inpatient   11/02/2015  8:23 PM 11/04/2015 10:59 AM Full Code [id number]  Dustin Flock, MD Inpatient   01/17/2012  8:32 PM  01/28/2012  4:49 PM DNR [id number]  Mila Palmer, RN Inpatient    Advance Directive Documentation   Flowsheet Row Most Recent Value  Type of Advance Directive  Healthcare Power of Attorney  Pre-existing out of facility DNR order (yellow form or pink MOST form)  No data  "MOST" Form in Place?  No data      TOTAL TIME TAKING CARE OF THIS PATIENT: 40 minutes.    Theodoro Grist M.D on 06/02/2016 at 2:31 PM  Between 7am to 6pm - Pager - [phone]  After 6pm go to [email or web address] - password [email or web address] [facility]  [facility]  Office  [phone]  CC: Primary care physician; No PCP Per Patient

## 2016-06-02 NOTE — Progress Notes (Signed)
Patient is being discharged and going home via EMS. Referral for PT, OT and home health aide recommended by physician. EMS called, patient ready and belongings packed. IV removed.

## 2016-06-05 LAB — CULTURE, BLOOD (ROUTINE X 2): CULTURE: NO GROWTH

## 2016-07-16 ENCOUNTER — Emergency Department: Payer: Medicare Other

## 2016-07-16 ENCOUNTER — Emergency Department
Admission: EM | Admit: 2016-07-16 | Discharge: 2016-07-17 | Disposition: A | Discharge status: Home / Self Care | Payer: Medicare Other | Attending: Emergency Medicine | Admitting: Emergency Medicine

## 2016-07-16 ENCOUNTER — Encounter: Payer: Self-pay | Admitting: Emergency Medicine

## 2016-07-16 DIAGNOSIS — E119 Type 2 diabetes mellitus without complications: Secondary | ICD-10-CM | POA: Diagnosis not present

## 2016-07-16 DIAGNOSIS — Z87891 Personal history of nicotine dependence: Secondary | ICD-10-CM | POA: Insufficient documentation

## 2016-07-16 DIAGNOSIS — Z794 Long term (current) use of insulin: Secondary | ICD-10-CM | POA: Insufficient documentation

## 2016-07-16 DIAGNOSIS — I509 Heart failure, unspecified: Secondary | ICD-10-CM | POA: Insufficient documentation

## 2016-07-16 DIAGNOSIS — Z853 Personal history of malignant neoplasm of breast: Secondary | ICD-10-CM | POA: Diagnosis not present

## 2016-07-16 DIAGNOSIS — R531 Weakness: Secondary | ICD-10-CM

## 2016-07-16 DIAGNOSIS — E039 Hypothyroidism, unspecified: Secondary | ICD-10-CM | POA: Insufficient documentation

## 2016-07-16 DIAGNOSIS — I2510 Atherosclerotic heart disease of native coronary artery without angina pectoris: Secondary | ICD-10-CM | POA: Diagnosis not present

## 2016-07-16 DIAGNOSIS — Z79899 Other long term (current) drug therapy: Secondary | ICD-10-CM | POA: Insufficient documentation

## 2016-07-16 DIAGNOSIS — J441 Chronic obstructive pulmonary disease with (acute) exacerbation: Secondary | ICD-10-CM

## 2016-07-16 DIAGNOSIS — J9610 Chronic respiratory failure, unspecified whether with hypoxia or hypercapnia: Secondary | ICD-10-CM | POA: Insufficient documentation

## 2016-07-16 DIAGNOSIS — R1012 Left upper quadrant pain: Secondary | ICD-10-CM | POA: Diagnosis not present

## 2016-07-16 DIAGNOSIS — Z7982 Long term (current) use of aspirin: Secondary | ICD-10-CM | POA: Insufficient documentation

## 2016-07-16 DIAGNOSIS — G8929 Other chronic pain: Secondary | ICD-10-CM | POA: Diagnosis not present

## 2016-07-16 LAB — CBC
HEMATOCRIT: 50.6 % — AB (ref 35.0–47.0)
HEMOGLOBIN: 16.9 g/dL — AB (ref 12.0–16.0)
MCH: 32.3 pg (ref 26.0–34.0)
MCHC: 33.4 g/dL (ref 32.0–36.0)
MCV: 96.8 fL (ref 80.0–100.0)
Platelets: 253 10*3/uL (ref 150–440)
RBC: 5.23 MIL/uL — ABNORMAL HIGH (ref 3.80–5.20)
RDW: 14 % (ref 11.5–14.5)
WBC: 9.9 10*3/uL (ref 3.6–11.0)

## 2016-07-16 LAB — GLUCOSE, CAPILLARY
GLUCOSE-CAPILLARY: 172 mg/dL — AB (ref 65–99)
Glucose-Capillary: 304 mg/dL — ABNORMAL HIGH (ref 65–99)

## 2016-07-16 LAB — BASIC METABOLIC PANEL
Anion gap: 8 (ref 5–15)
BUN: 14 mg/dL (ref 6–20)
CHLORIDE: 100 mmol/L — AB (ref 101–111)
CO2: 35 mmol/L — AB (ref 22–32)
CREATININE: 0.71 mg/dL (ref 0.44–1.00)
Calcium: 9.3 mg/dL (ref 8.9–10.3)
GFR calc non Af Amer: >60 mL/min (ref >60)
Glucose, Bld: 128 mg/dL — ABNORMAL HIGH (ref 65–99)
Potassium: 4.1 mmol/L (ref 3.5–5.1)
Sodium: 143 mmol/L (ref 135–145)

## 2016-07-16 LAB — TROPONIN I: Troponin I: <0.03 ng/mL (ref <0.03)

## 2016-07-16 MED ORDER — IPRATROPIUM-ALBUTEROL 0.5-2.5 (3) MG/3ML IN SOLN
3.0000 mL | Freq: Once | RESPIRATORY_TRACT | Status: AC
  Administered 2016-07-16: 3 mL via RESPIRATORY_TRACT
  Filled 2016-07-16: qty 3

## 2016-07-16 MED ORDER — TIOTROPIUM BROMIDE MONOHYDRATE 18 MCG IN CAPS
18.0000 ug | ORAL_CAPSULE | Freq: Every day | RESPIRATORY_TRACT | Status: DC
  Administered 2016-07-17: 18 ug via RESPIRATORY_TRACT
  Filled 2016-07-16: qty 5

## 2016-07-16 MED ORDER — PREDNISONE 20 MG PO TABS
40.0000 mg | ORAL_TABLET | Freq: Every day | ORAL | Status: DC
  Administered 2016-07-17: 40 mg via ORAL
  Filled 2016-07-16: qty 2

## 2016-07-16 MED ORDER — INSULIN ASPART 100 UNIT/ML ~~LOC~~ SOLN: 0.0000 [IU] | Freq: Three times a day (TID) | SUBCUTANEOUS | Status: DC

## 2016-07-16 MED ORDER — INSULIN DETEMIR 100 UNIT/ML ~~LOC~~ SOLN: 25.0000 [IU] | Freq: Every day | SUBCUTANEOUS | Status: DC

## 2016-07-16 MED ORDER — IPRATROPIUM-ALBUTEROL 0.5-2.5 (3) MG/3ML IN SOLN: 3.0000 mL | RESPIRATORY_TRACT | Status: DC | PRN

## 2016-07-16 MED ORDER — PREDNISONE 20 MG PO TABS
40.0000 mg | ORAL_TABLET | Freq: Once | ORAL | Status: AC
  Administered 2016-07-16: 40 mg via ORAL
  Filled 2016-07-16: qty 2

## 2016-07-16 MED ORDER — INSULIN ASPART 100 UNIT/ML ~~LOC~~ SOLN
0.0000 [IU] | Freq: Three times a day (TID) | SUBCUTANEOUS | Status: DC
  Filled 2016-07-16: qty 4

## 2016-07-16 MED ORDER — IPRATROPIUM-ALBUTEROL 0.5-2.5 (3) MG/3ML IN SOLN: 3.0000 mL | Freq: Four times a day (QID) | RESPIRATORY_TRACT | Status: DC | PRN

## 2016-07-16 NOTE — ED Provider Notes (Signed)
UA still not available patient will be signed out to Dr. Dineen Kid for continued monitoring and hopefully the patient will have a place to go in the morning   Nena Polio, MD 07/16/16 2300

## 2016-07-16 NOTE — Care Management Note (Signed)
Case Management Note  Patient Details  Name: Kaitlin Henderson MRN: [id number] Date of Birth: 09/12/1940  Subjective/Objective:   Call from Rory Percy at [facility], to say the patient will almost certainly need SNF placement. They have been working with her, and the patient is unable to care for herself. They obtained placement at the [facility], but the patient will need a higher level of care. Will make CSW aware.                 Action/Plan:   Expected Discharge Date:                  Expected Discharge Plan:     In-House Referral:     Discharge planning Services     Post Acute Care Choice:    Choice offered to:     DME Arranged:    DME Agency:     HH Arranged:    HH Agency:     Status of Service:     If discussed at [employer] of Stay Meetings, dates discussed:    Additional Comments:  Beau Fanny, RN 07/16/2016, 12:50 PM

## 2016-07-16 NOTE — ED Triage Notes (Signed)
Brought in via ems from [facility] for possible placement  Pt was found lying in feces and urine for an unknown time   States she is not able to get up and do for herself

## 2016-07-16 NOTE — ED Notes (Signed)
Pt given something to drink and sandwich tray.

## 2016-07-16 NOTE — Care Management Note (Signed)
Case Management Note  Patient Details  Name: Kaitlin Henderson MRN: [id number] Date of Birth: 1941/05/15  Subjective/Objective:       Spoke to [place] at [facility] [phone], who verifies that the patient does not have medicaid in place. She also says that doctors making housecalls has visited the patient at [facility]. I am calling them to verify info, and the MD has agreed to do a PT consult to assess function .             Action/Plan:   Expected Discharge Date:                  Expected Discharge Plan:     In-House Referral:     Discharge planning Services     Post Acute Care Choice:    Choice offered to:     DME Arranged:    DME Agency:     HH Arranged:    HH Agency:     Status of Service:     If discussed at [employer] of Stay Meetings, dates discussed:    Additional Comments:  Beau Fanny, RN 07/16/2016, 2:32 PM

## 2016-07-16 NOTE — ED Notes (Signed)
No orders per edp at this time.

## 2016-07-16 NOTE — ED Notes (Signed)
Pt cleaned and linens changed

## 2016-07-16 NOTE — ED Notes (Signed)
CSW received consult for SNF placement. Pt has not yet been seen by PT and is still being medically evaluated. Pending PT recommendations, CSW will assist in getting placement for pt in the appropriate level of care. Pt has Medicare which will require a qualifying stay for SNF.   Kaitlin Henderson, MSW, [place]

## 2016-07-16 NOTE — ED Notes (Signed)
Pt states she is unable to ambulate and stays in the bed all the time. Pt arrived lying in urine and stool on arrival to ed. Pt was cleaned and dried after arrival. Nad.

## 2016-07-16 NOTE — ED Provider Notes (Signed)
[facility] Emergency Department Provider Note   ____________________________________________   First MD Initiated Contact with Patient 07/16/16 1316     (approximate)  I have reviewed the triage vital signs and the nursing notes.   HISTORY  Chief Complaint Weakness    HPI Kaitlin Henderson is a 75 y.o. female history of COPD and breast cancer, congestive heart failure, coronary disease who presents today for evaluation as she is unable to get herself up from bed for over a weeks time. She reports that she is chronically very weak and tired, she wasn't able to get herself up and staff at her assisted living unable to provide care to her beyond feeding her.  She denies any chest pain. She has a slight dry cough but no new shortness of breath. Denies fevers or chills. Denies numbness or weakness. No headache. Patient reports her biggest concern right now is obtaining care as she is not able to get herself up to use the bathroom.  Past Medical History:  Diagnosis Date  . Anginal pain ([place])   . Aphthous stomatitis   . APHTHOUS STOMATITIS   . Breast cancer, right breast ([place]) 1998   s/p chemo & XRT; right mastectomy  . Breast cancer, right breast ([place]) 1998   s/p chemo & XRT, right mastectomy  . C O P D    chronic O2 3LPM [place]  . CHF (congestive heart failure) ([place])   . COPD (chronic obstructive pulmonary disease) ([place])   . Coronary artery disease   . CORONARY ARTERY DISEASE   . Depression   . DEPRESSION    started sertraline 09/2010  . Diabetes mellitus, type 2 ([place]) 03/2010  . DIABETES, TYPE 2 dx 03/2010  . Essential tremor   . GERD (gastroesophageal reflux disease)   . Hyperlipemia   . HYPERLIPIDEMIA   . Hypothyroidism   . NSTEMI (non-ST elevated myocardial infarction) ([place]) 01/2012  . NSTEMI (non-ST elevated myocardial infarction) ([place]) 01/2012   med mgmt  . Obesity   . OBESITY     Patient Active Problem List   Diagnosis Date Noted  .  Generalized weakness 06/02/2016  . Lower back pain 06/02/2016  . Hip pain 06/02/2016  . Diarrhea 06/02/2016  . Diabetes ([place]) 06/02/2016  . Lung nodule 11/25/2015  . Anxiety 11/25/2015  . GERD (gastroesophageal reflux disease) 11/25/2015  . Cellulitis 11/16/2015  . 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 Acute on chronic respiratory failure with hypoxia ([place])   . Non-compliant behavior 12/05/2014  . Decreased mobility 08/15/2014  . Benign essential tremor 09/05/2011  . Breast cancer, right breast ([place])   . DEPRESSION 09/25/2010  . HAIR LOSS 06/26/2010  . Type 2 diabetes mellitus with neurological complications ([place]) [id number]  . POLYURIA 03/13/2010  . APHTHOUS STOMATITIS 08/23/2009  . CHRONIC RESPIRATORY FAILURE 08/18/2007  . Hyperlipidemia 08/17/2007  . OBESITY 08/17/2007  . CORONARY ARTERY DISEASE 08/17/2007  . COPD with emphysema, [place]  08/17/2007    Past Surgical History:  Procedure Laterality Date  . ABDOMINAL HYSTERECTOMY    . APPENDECTOMY    . CHOLECYSTECTOMY    . MASTECTOMY    . right mastectomy    . Right Mastectomy    . TONSILLECTOMY      Prior to Admission medications   Medication Sig Start Date End Date Taking? Authorizing Provider  aspirin 325 MG tablet Take 325 mg by mouth 2 (two) times daily.    Yes Historical Provider, MD  atorvastatin (LIPITOR)  40 MG tablet Take 1 tablet (40 mg total) by mouth at bedtime. Patient not taking: Reported on 07/16/2016 05/09/16   Fritzi Mandes, MD  insulin aspart (NOVOLOG) 100 UNIT/ML injection Inject 0-20 Units into the skin 3 (three) times daily with meals. Patient not taking: Reported on 07/16/2016 05/09/16   Fritzi Mandes, MD  insulin detemir (LEVEMIR) 100 UNIT/ML injection Inject 0.25 mLs (25 Units total) into the skin at bedtime. Patient not taking: Reported on 07/16/2016 05/09/16   Fritzi Mandes, MD  Ipratropium-Albuterol (COMBIVENT) 20-100 MCG/ACT AERS respimat Inhale 1 puff into the lungs every 6 (six) hours as needed  for wheezing. Patient not taking: Reported on 07/16/2016 05/09/16   Fritzi Mandes, MD  lidocaine-prilocaine (EMLA) cream Apply 1 application topically as needed. Patient not taking: Reported on 07/16/2016 06/02/16   Theodoro Grist, MD  polyethylene glycol (MIRALAX / GLYCOLAX) packet Take 17 g by mouth daily as needed for mild constipation. Patient not taking: Reported on 07/16/2016 05/09/16   Fritzi Mandes, MD  predniSONE (DELTASONE) 10 MG tablet Take 50 mg daily and taper by 10 mg daily then stop Patient not taking: Reported on 07/16/2016 05/09/16   Fritzi Mandes, MD  tiotropium (SPIRIVA) 18 MCG inhalation capsule Place 1 capsule (18 mcg total) into inhaler and inhale daily. Patient not taking: Reported on 07/16/2016 05/09/16   Fritzi Mandes, MD    Allergies Codeine; Levofloxacin; and Sulfa antibiotics  Family History  Problem Relation Age of Onset  . Diabetes Neg Hx   . Cancer Neg Hx     Social History Social History  Substance Use Topics  . Smoking status: Former Smoker    Packs/day: 2.50    Years: 54.00    Types: Cigarettes    Quit date: 09/02/1997  . Smokeless tobacco: Never Used     Comment: Retired. Widow/widower since 2008-lives alone. Home hospice since 06/2009 related to COPD  . Alcohol use No    Review of Systems Constitutional: No fever/chills Eyes: No visual changes. ENT: No sore throat. Cardiovascular: Denies chest pain. Respiratory: Denies shortness of breath except for her relative to actively chronic shortness of breath for which she uses 6 L oxygen, reports no significant change except for a slight nonproductive cough. Gastrointestinal: No abdominal pain.  No nausea, no vomiting.  Genitourinary: Negative for dysuria. Musculoskeletal: Negative for back pain except her lower buttock feels "sore" from laying in bed for a week. Skin: Negative for rash. Neurological: Negative for headaches, focal weakness or numbness.  10-point ROS otherwise  negative.  ____________________________________________   PHYSICAL EXAM:  VITAL SIGNS: ED Triage Vitals [07/16/16 1333]  Enc Vitals Group     BP (!) 127/116     Pulse Rate 93     Resp (!) 30     Temp 97.9 F (36.6 C)     Temp Source Oral     SpO2 95 %     Weight      Height      Head Circumference      Peak Flow      Pain Score      Pain Loc      Pain Edu?      Excl. in [place]?     Constitutional: Alert and oriented. Well appearing and in no acute distress.She is oriented to place, date, time. Eyes: Conjunctivae are normal. PERRL. EOMI. Head: Atraumatic. Nose: No congestion/rhinnorhea. Mouth/Throat: Mucous membranes are moist.  Oropharynx non-erythematous. Neck: No stridor.   Cardiovascular: Normal rate, regular rhythm. Grossly normal heart  sounds.  Good peripheral circulation. Respiratory: Normal respiratory effort.  No retractions. Lungs CTAB. Gastrointestinal: Soft and nontender. No distention.  Musculoskeletal: No lower extremity tenderness nor edema.  No joint effusions. No sacral ulcers noted. No skin breakdown is noted on the back, but the patient does have irritated mild rash across the upper back that appears consistent with contact type dermatitis Neurologic:  Normal speech and language. No gross focal neurologic deficits are appreciated. She exhibits about 4-5 strength in all extremities. She does have mild weakness about the right shoulder but reports this is due to chronic issues with her rotator cuff no new changes. Skin:  Skin is warm, dry and intact. No rash noted. Psychiatric: Mood and affect are normal. Speech and behavior are normal. Patient appears to have normal insight and responses to my questions. Appears to have capacity to make decisions.  ____________________________________________   LABS (all labs ordered are listed, but only abnormal results are displayed)  Labs Reviewed  CBC - Abnormal; Notable for the following:       Result Value   RBC  5.23 (*)    Hemoglobin 16.9 (*)    HCT 50.6 (*)    All other components within normal limits  BASIC METABOLIC PANEL - Abnormal; Notable for the following:    Chloride 100 (*)    CO2 35 (*)    Glucose, Bld 128 (*)    All other components within normal limits  TROPONIN I  URINALYSIS COMPLETEWITH MICROSCOPIC ([facility] ONLY)  CBG MONITORING, ED  CBG MONITORING, ED   ____________________________________________  EKG  Reviewed injury by me at 1420 Heart rate 90 QRS 100 Normal sinus rhythm, no ischemic abnormality, QTC 460 ____________________________________________  RADIOLOGY  Dg Chest Portable 1 View  Result Date: 07/16/2016 CLINICAL DATA:  Ongoing weakness, COPD, former smoker, history RIGHT breast cancer, CHF, coronary disease post STEMI, type II diabetes mellitus EXAM: PORTABLE CHEST 1 VIEW COMPARISON:  Portable exam 1401 hours compared to 05/31/2016 FINDINGS: Volume loss in the RIGHT hemithorax with mediastinal shift to the RIGHT. Normal heart size, mediastinal contours, and pulmonary vascularity. Underlying COPD changes without infiltrate, pleural effusion or pneumothorax. Diffuse osseous demineralization. IMPRESSION: COPD changes with volume loss in RIGHT chest, chronic. No acute abnormalities. Electronically Signed   By: Lavonia Dana M.D.   On: 07/16/2016 14:24    ____________________________________________   PROCEDURES  Procedure(s) performed: None  Procedures  Critical Care performed: No  ____________________________________________   INITIAL IMPRESSION / ASSESSMENT AND PLAN / ED COURSE  Pertinent labs & imaging results that were available during my care of the patient were reviewed by me and considered in my medical decision making (see chart for details).  Patient presents for evaluation of generalized weakness and fatigue unable to get out of bed for 1 week. Patient has been urinating and defecating on herself and she has no assistance to get her up from bed  for about a week. She reports that she has chronically very tired, she uses 5 L of oxygen at baseline for "COPD". She has not had any significant change in her symptoms, has a normal dry cough, no fever, no nausea or vomiting. She continued to eat and drink normally but has been completely debilitated with regard to be elderly get up out of bed due to weakness. She notes this is not the first time this is happened, she has suffered with chronic weakness for quite some time. Department of Social Services advised that they will contact her and see her  in the ER this afternoon to assist with placing her. Patient reports her goal is to be able to go to a place like a nursing home for ongoing care.    Clinical Course     ----------------------------------------- 4:48 PM on 07/16/2016 -----------------------------------------  Patient resting currently. Patient agreement with the plan to await case management plan and Department of Social Services follow-up regarding appropriate care and placement. At the present time she is resting comfortably, reports shortness of breath improved. Labs appears stable, no acute abnormalities noted. Patient may have a mild COPD exacerbation, however feel most likely some chronic debility likely leading to her inability to ambulate. No evidence for acute stroke. Continue the patient in the ER presently, continue care, and await plan from case management and social work. Ongoing care assigned to Dr. Rip Harbour. Dr. Cinda Quest will follow-up on UA and case management placement plans. ____________________________________________   FINAL CLINICAL IMPRESSION(S) / ED DIAGNOSES  Final diagnoses:  Weakness  Generalized weakness  COPD exacerbation (HCC)      NEW MEDICATIONS STARTED DURING THIS VISIT:  New Prescriptions   No medications on file     Note:  This document was prepared using Dragon voice recognition software and may include unintentional dictation errors.      Delman Kitten, MD 07/16/16 [phone]

## 2016-07-16 NOTE — Care Management Note (Signed)
Case Management Note  Patient Details  Name: Kaitlin Henderson MRN: [id number] Date of Birth: 1941/04/14  Subjective/Objective:      Spoke to the patient and she has confirmed that she has not applied for Medicaid, she says she had no transport , or time to do so. She also confirms that she has no PCP. Because of this she is not taking all the medications she has had in the past. I called Rory Percy at [phone], and apprised her of the situation.  The patient has only Medicare in place and will most likely need a payment out of pocket. The patient and [name] agree that the patient does not have any funds for that. [name] says the patient was in skilled previously, and is willing to research this and call us back. Apparently she did well when in SNF. Will ask MD if he is willing to consult PT and Beh.                Action/Plan:   Expected Discharge Date:                  Expected Discharge Plan:     In-House Referral:     Discharge planning Services     Post Acute Care Choice:    Choice offered to:     DME Arranged:    DME Agency:     HH Arranged:    HH Agency:     Status of Service:     If discussed at [employer] of Stay Meetings, dates discussed:    Additional Comments:  Beau Fanny, RN 07/16/2016, 2:10 PM

## 2016-07-16 NOTE — Discharge Instructions (Signed)
We believe that your symptoms are caused today by an exacerbation of your COPD, and possibly bronchitis as well as your chronic weakness.  Please take the prescribed medications and any medications that you have at home for your COPD.  Follow up with your doctor as recommended.  If you develop any new or worsening symptoms, including but not limited to fever, persistent vomiting, worsening shortness of breath, or other symptoms that concern you, please return to the Emergency Department immediately.

## 2016-07-17 LAB — URINALYSIS COMPLETE WITH MICROSCOPIC (ARMC ONLY)
Bacteria, UA: NONE SEEN
Bilirubin Urine: NEGATIVE
Glucose, UA: >500 mg/dL — AB
NITRITE: NEGATIVE
PH: 5 (ref 5.0–8.0)
PROTEIN: NEGATIVE mg/dL
SPECIFIC GRAVITY, URINE: 1.031 — AB (ref 1.005–1.030)

## 2016-07-17 LAB — GLUCOSE, CAPILLARY
GLUCOSE-CAPILLARY: 181 mg/dL — AB (ref 65–99)
GLUCOSE-CAPILLARY: 281 mg/dL — AB (ref 65–99)
Glucose-Capillary: 104 mg/dL — ABNORMAL HIGH (ref 65–99)

## 2016-07-17 MED ORDER — INSULIN ASPART 100 UNIT/ML ~~LOC~~ SOLN
0.0000 [IU] | Freq: Three times a day (TID) | SUBCUTANEOUS | Status: DC
  Administered 2016-07-17: 4 [IU] via INTRAVENOUS

## 2016-07-17 MED ORDER — TUBERCULIN PPD 5 UNIT/0.1ML ID SOLN
5.0000 [IU] | Freq: Once | INTRADERMAL | Status: DC
  Administered 2016-07-17: 5 [IU] via INTRADERMAL
  Filled 2016-07-17: qty 0.1

## 2016-07-17 MED ORDER — PREDNISONE 20 MG PO TABS: 40.0000 mg | ORAL_TABLET | Freq: Every day | ORAL | 0 refills | Status: AC

## 2016-07-17 MED ORDER — INSULIN ASPART 100 UNIT/ML ~~LOC~~ SOLN: 0.0000 [IU] | Freq: Three times a day (TID) | SUBCUTANEOUS | Status: DC

## 2016-07-17 NOTE — Care Management Note (Signed)
Case Management Note  Patient Details  Name: Kaitlin Henderson MRN: [id number] Date of Birth: [birth year]  Subjective/Objective:    Contacted the [place], covering the ER . I have given her the contact info for the [facility], the community MD info, and explained that I have contacted the [facility] agency to see if the patient can be put back on their list. In speaking to the patient there is a knowledge deficit , and the patient requires frequent reassurances and information repeated. She does not want to go back to [facility], and is fixated on the [facility]. We have explained again that although [facility] did get her a bed there, it is not available until December 1st. The DSS SW is trying to get the bed acceptance moved up. Jarrett Soho is agreeing to follow up on that . If all else fails the patient may need to be discharged to [facility] and have [facility] set up for PT, SW, aide.               Action/Plan:   Expected Discharge Date:                  Expected Discharge Plan:     In-House Referral:     Discharge planning Services     Post Acute Care Choice:    Choice offered to:     DME Arranged:    DME Agency:     HH Arranged:    HH Agency:     Status of Service:     If discussed at [employer] of Stay Meetings, dates discussed:    Additional Comments:  Beau Fanny, RN 07/17/2016, 10:16 AM

## 2016-07-17 NOTE — ED Provider Notes (Addendum)
[facility] Emergency Department Provider Note  ____________________________________________  Time seen: Approximately 4:45 PM  I have reviewed the triage vital signs and the nursing notes.   HISTORY  Chief Complaint Malfunctioning oxygen concentrator at nursing home   HPI Kaitlin Henderson is a 75 y.o. female Patient was discharged earlier today to the [place]. On nasal cannula oxygen. However when she arrived there at her concentrator was malfunctioning and so she was returned to the ED for continued oxygen delivery until home health services could arrive to replace the device.  Patient denies any acute complaints. No chest pain shortness of breath or acute cough. She complains of chronic left upper quadrant pain for many months which is unchanged. No aggravating or alleviating factors to this. She is eating and drinking normally. states she used to take Zantac but she stopped taking it many months ago. Also notes that her left leg is chronically red without pain or swelling. She's been evaluated for this before and reports that her doctor was not concerned.     Past Medical History:  Diagnosis Date  . Anginal pain ([place])   . Aphthous stomatitis   . APHTHOUS STOMATITIS   . Breast cancer, right breast ([place]) 1998   s/p chemo & XRT; right mastectomy  . Breast cancer, right breast ([place]) 1998   s/p chemo & XRT, right mastectomy  . C O P D    chronic O2 3LPM [place]  . CHF (congestive heart failure) ([place])   . COPD (chronic obstructive pulmonary disease) ([place])   . Coronary artery disease   . CORONARY ARTERY DISEASE   . Depression   . DEPRESSION    started sertraline 09/2010  . Diabetes mellitus, type 2 ([place]) 03/2010  . DIABETES, TYPE 2 dx 03/2010  . Essential tremor   . GERD (gastroesophageal reflux disease)   . Hyperlipemia   . HYPERLIPIDEMIA   . Hypothyroidism   . NSTEMI (non-ST elevated myocardial infarction) ([place]) 01/2012  . NSTEMI (non-ST elevated myocardial  infarction) ([place]) 01/2012   med mgmt  . Obesity   . OBESITY      Patient Active Problem List   Diagnosis Date Noted  . Generalized weakness 06/02/2016  . Lower back pain 06/02/2016  . Hip pain 06/02/2016  . Diarrhea 06/02/2016  . Diabetes ([place]) 06/02/2016  . Lung nodule 11/25/2015  . Anxiety 11/25/2015  . GERD (gastroesophageal reflux disease) 11/25/2015  . Cellulitis 11/16/2015  . 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 Acute on chronic respiratory failure with hypoxia ([place])   . Non-compliant behavior 12/05/2014  . Decreased mobility 08/15/2014  . Benign essential tremor 09/05/2011  . Breast cancer, right breast ([place])   . DEPRESSION 09/25/2010  . HAIR LOSS 06/26/2010  . Type 2 diabetes mellitus with neurological complications ([place]) [id number]  . POLYURIA 03/13/2010  . APHTHOUS STOMATITIS 08/23/2009  . CHRONIC RESPIRATORY FAILURE 08/18/2007  . Hyperlipidemia 08/17/2007  . OBESITY 08/17/2007  . CORONARY ARTERY DISEASE 08/17/2007  . COPD with emphysema, [place]  08/17/2007     Past Surgical History:  Procedure Laterality Date  . ABDOMINAL HYSTERECTOMY    . APPENDECTOMY    . CHOLECYSTECTOMY    . MASTECTOMY    . right mastectomy    . Right Mastectomy    . TONSILLECTOMY       Prior to Admission medications   Medication Sig Start Date End Date Taking? Authorizing Provider  aspirin 325 MG tablet Take 325 mg by mouth 2 (two)  times daily.    Yes Historical Provider, MD  atorvastatin (LIPITOR) 40 MG tablet Take 1 tablet (40 mg total) by mouth at bedtime. Patient not taking: Reported on 07/16/2016 05/09/16   Fritzi Mandes, MD  insulin aspart (NOVOLOG) 100 UNIT/ML injection Inject 0-20 Units into the skin 3 (three) times daily with meals. Patient not taking: Reported on 07/16/2016 05/09/16   Fritzi Mandes, MD  insulin detemir (LEVEMIR) 100 UNIT/ML injection Inject 0.25 mLs (25 Units total) into the skin at bedtime. Patient not taking: Reported on 07/16/2016 05/09/16   Fritzi Mandes, MD  Ipratropium-Albuterol (COMBIVENT) 20-100 MCG/ACT AERS respimat Inhale 1 puff into the lungs every 6 (six) hours as needed for wheezing. Patient not taking: Reported on 07/16/2016 05/09/16   Fritzi Mandes, MD  lidocaine-prilocaine (EMLA) cream Apply 1 application topically as needed. Patient not taking: Reported on 07/16/2016 06/02/16   Theodoro Grist, MD  polyethylene glycol (MIRALAX / GLYCOLAX) packet Take 17 g by mouth daily as needed for mild constipation. Patient not taking: Reported on 07/16/2016 05/09/16   Fritzi Mandes, MD  predniSONE (DELTASONE) 20 MG tablet Take 2 tablets (40 mg total) by mouth daily. 07/18/16 07/20/16  Anne-Caroline Mariea Clonts, MD  tiotropium (SPIRIVA) 18 MCG inhalation capsule Place 1 capsule (18 mcg total) into inhaler and inhale daily. Patient not taking: Reported on 07/16/2016 05/09/16   Fritzi Mandes, MD     Allergies Codeine; Levofloxacin; and Sulfa antibiotics   Family History  Problem Relation Age of Onset  . Diabetes Neg Hx   . Cancer Neg Hx     Social History Social History  Substance Use Topics  . Smoking status: Former Smoker    Packs/day: 2.50    Years: 54.00    Types: Cigarettes    Quit date: 09/02/1997  . Smokeless tobacco: Never Used     Comment: Retired. Widow/widower since 2008-lives alone. Home hospice since 06/2009 related to COPD  . Alcohol use No    Review of Systems  Constitutional:   No fever or chills.  ENT:   No sore throat. No rhinorrhea. Cardiovascular:   No chest pain. Respiratory:   No Acute dyspnea or cough. Gastrointestinal:   Chronic left upper quadrant abdominal pain as above.  Genitourinary:   Negative for dysuria or difficulty urinating. Musculoskeletal:   Negative for focal pain or swelling. Chronic redness of left calf  10-point ROS otherwise negative.  ____________________________________________   PHYSICAL EXAM:  VITAL SIGNS: ED Triage Vitals [07/16/16 1333]  Enc Vitals Group     BP (!) 127/116     Pulse  Rate 93     Resp (!) 30     Temp 97.9 F (36.6 C)     Temp Source Oral     SpO2 95 %     Weight      Height      Head Circumference      Peak Flow      Pain Score      Pain Loc      Pain Edu?      Excl. in [place]?   Oxygen saturation 93-95% on her baseline 5 liters nasal cannula  Vital signs reviewed, nursing assessments reviewed.   Constitutional:   Alert and oriented. Well appearing and in no distress. Eyes:   No scleral icterus. No conjunctival pallor. PERRL. EOMI.  No nystagmus. ENT   Head:   Normocephalic and atraumatic.   Nose:   No congestion/rhinnorhea. No septal hematoma   Mouth/Throat:   MMM, no  pharyngeal erythema. No peritonsillar mass.    Neck:   No stridor. No SubQ emphysema. No meningismus. Hematological/Lymphatic/Immunilogical:   No cervical lymphadenopathy. Cardiovascular:   RRR. Symmetric bilateral radial and DP pulses.  No murmurs.  Respiratory:   Normal respiratory effort without  retractions. Breath sounds are clear and equal bilaterally. No wheezes/rales/rhonchi. Gastrointestinal:   Soft and nontender. Non distended. There is no CVA tenderness.  No rebound, rigidity, or guarding. Genitourinary:   deferred Musculoskeletal:   Nontender with normal range of motion in all extremities. No joint effusions.  No lower extremity tenderness.  No edema. Calf circumference symmetric. Negative Homans sign. There is faint diffuse erythema of the left lower leg without overt inflammatory changes or swelling. Neurologic:   Normal speech and language.  CN 2-10 normal. Motor grossly intact. No gross focal neurologic deficits are appreciated.  Skin:    Skin is warm, dry and intact. No rash noted.  No petechiae, purpura, or bullae.  ____________________________________________    LABS (pertinent positives/negatives) (all labs ordered are listed, but only abnormal results are displayed) Labs Reviewed  CBC - Abnormal; Notable for the following:       Result Value    RBC 5.23 (*)    Hemoglobin 16.9 (*)    HCT 50.6 (*)    All other components within normal limits  BASIC METABOLIC PANEL - Abnormal; Notable for the following:    Chloride 100 (*)    CO2 35 (*)    Glucose, Bld 128 (*)    All other components within normal limits  URINALYSIS COMPLETEWITH MICROSCOPIC ([facility] ONLY) - Abnormal; Notable for the following:    Color, Urine YELLOW (*)    APPearance CLEAR (*)    Glucose, UA >500 (*)    Ketones, ur TRACE (*)    Specific Gravity, Urine 1.031 (*)    Hgb urine dipstick 1+ (*)    Leukocytes, UA 1+ (*)    Squamous Epithelial / LPF 0-5 (*)    All other components within normal limits  GLUCOSE, CAPILLARY - Abnormal; Notable for the following:    Glucose-Capillary 172 (*)    All other components within normal limits  GLUCOSE, CAPILLARY - Abnormal; Notable for the following:    Glucose-Capillary 304 (*)    All other components within normal limits  GLUCOSE, CAPILLARY - Abnormal; Notable for the following:    Glucose-Capillary 281 (*)    All other components within normal limits  GLUCOSE, CAPILLARY - Abnormal; Notable for the following:    Glucose-Capillary 104 (*)    All other components within normal limits  TROPONIN I  CBG MONITORING, ED  CBG MONITORING, ED  CBG MONITORING, ED   ____________________________________________   EKG    ____________________________________________    RADIOLOGY    ____________________________________________   PROCEDURES Procedures  ____________________________________________   INITIAL IMPRESSION / ASSESSMENT AND PLAN / ED COURSE  Pertinent labs & imaging results that were available during my care of the patient were reviewed by me and considered in my medical decision making (see chart for details).  Patient well appearing no acute distress. No acute complaints. Sent to the ED essentially to receive continuous oxygen at her baseline until her oxygen concentrator device at her nursing home  could be replaced. Physical exam is unremarkable, vital signs unremarkable. No further workup indicated at this time. Low suspicion of DVT or soft tissue infection in the leg. Low suspicion of perforation obstruction diverticulitis biliary disease AAA or other significant pathology in the abdomen. No  change in respiratory status.  We'll continue providing comfort measures and nasal cannula oxygen in the ED until her facility is ready to receive her again. She'll then be discharged back to the facility to continue her outpatient care.   Clinical Course     ----------------------------------------- 7:00 PM on 07/17/2016 -----------------------------------------  [facility] is now ready to receive the patient. Discharge instructions and prescription from earlier today verified, no changes needed. No change in status. Suitable for discharge at this time. ____________________________________________   FINAL CLINICAL IMPRESSION(S) / ED DIAGNOSES  Final diagnoses:  Weakness  Generalized weakness  COPD exacerbation ([place])  Chronic respiratory failure     Portions of this note were generated with dragon dictation software. Dictation errors may occur despite best attempts at proofreading.    Carrie Mew, MD 07/17/16 [place], MD 07/17/16 [phone]

## 2016-07-17 NOTE — ED Notes (Signed)
Pt cleaned and linens changed.  Minor blanchable redness noted to patient's bottom.

## 2016-07-17 NOTE — ED Notes (Signed)
[facility] called by this RN. States they are taking residents to an appointment then they will be by to pick up the patient.

## 2016-07-17 NOTE — ED Notes (Signed)
The [facility] arrived but did not bring O2 tank for patient. Social work called. Patient will be transported via EMS after paperwork is done and TB screening in completed.

## 2016-07-17 NOTE — Care Management Note (Signed)
Case Management Note  Patient Details  Name: Kaitlin Henderson MRN: [id number] Date of Birth: 02-26-1941  Subjective/Objective:  Call from [place], [place] to check on patient status. I have confirmed with her that we are awaiting PT evaluation, but that their recommendation probably will not change the fact that the patient is straight [employer]. As such there is no qualifying Inpatient stay, for Medicare to pay for higher level of care. This will mean the possible d/c of the patient back to [facility], hoping [place] can offer the patient some support until December 1st when she can go to the [place] [facility]. DSS has said they are trying to contact the [place] , to see if the bed for the patient might be available earlier. We await the findings of PT and DSS.                  Action/Plan:   Expected Discharge Date:                  Expected Discharge Plan:     In-House Referral:     Discharge planning Services     Post Acute Care Choice:    Choice offered to:     DME Arranged:    DME Agency:     HH Arranged:    HH Agency:     Status of Service:     If discussed at [employer] of Stay Meetings, dates discussed:    Additional Comments:  Beau Fanny, RN 07/17/2016, 8:45 AM

## 2016-07-17 NOTE — ED Notes (Signed)
Pt incontinent of a large amount of urine, pt cleaned and linen changed

## 2016-07-17 NOTE — ED Notes (Signed)
Patient will be discharged to [facility]. They will be picking patient up and transporting her to their facility.

## 2016-07-17 NOTE — Care Management Note (Signed)
Case Management Note  Patient Details  Name: Kaitlin Henderson MRN: [id number] Date of Birth: August 21, 1941  Subjective/Objective:       Call from ER charge to say that EMS hadproblems with the oxygen equipment which was transferred from the patient's previous home to [facility], which she had agreed to be placed at. The [facility] representative was contacted and they have a 4 hour guarantee as their window. Since this is a potentially life threatening situation, I advised the patient be brought back to the ER until the rep can deliver and place the oxygen setup needed, and we are sure the patient has what she needs. The DSS CSW Wess Botts has been made aware of the situation, as well as the fact that the patient is now concerned about going to the [place]. She is going to visit the patient this afternoon, to allay any fears / questions the patient may have.             Action/Plan:   Expected Discharge Date:                  Expected Discharge Plan:     In-House Referral:     Discharge planning Services     Post Acute Care Choice:    Choice offered to:     DME Arranged:    DME Agency:     HH Arranged:    HH Agency:     Status of Service:     If discussed at [employer] of Stay Meetings, dates discussed:    Additional Comments:  Beau Fanny, RN 07/17/2016, 4:27 PM

## 2016-07-17 NOTE — Care Management Note (Signed)
Case Management Note  Patient Details  Name: DENNYS FRISINGER MRN: [id number] Date of Birth: 1941/04/27  Subjective/Objective:    Confirmation from [place], [place] that [facility] is willing to take the patient today. They are on their way. I have arranged for [facility] to see the patient with PT, OT SN, and an aide. RN for the patient and the MD are aware.                Action/Plan:   Expected Discharge Date:                  Expected Discharge Plan:     In-House Referral:     Discharge planning Services     Post Acute Care Choice:    Choice offered to:     DME Arranged:    DME Agency:     HH Arranged:    HH Agency:     Status of Service:     If discussed at [employer] of Stay Meetings, dates discussed:    Additional Comments:  Beau Fanny, RN 07/17/2016, 11:18 AM

## 2016-07-17 NOTE — Evaluation (Signed)
Physical Therapy Evaluation Patient Details Name: Kaitlin Henderson MRN: [id number] DOB: [birth year] Today's Date: 07/17/2016   History of Present Illness  Pt is a 75 y/o F who presents from [facility] with c/o weakness and inability to get OOB for over a week.  Pt's PMH includes anginal pain, R breast cancer s/p mastectomy, COPD, CHF, NSTEMI, obesity, essential tremor.    Clinical Impression  Pt admitted with above diagnosis. Pt currently with functional limitations due to the deficits listed below (see PT Problem List). Kaitlin Henderson presents with generalized weakness and is quick to fatigue, requiring max assist to achieve partial sitting before saying, "I can't do this, not today". She has no assist available at ALF or from friends or family and will benefit from SNF at d/c.  At baseline she stands and pivot to her powered WC without assist but is unable to complete ADLs such as bathing independently.  She c/o L chest pain that was sharp on exertion, RN notified who believes it may be related to her COPD.  Pt will benefit from skilled PT to increase their independence and safety with mobility to allow discharge to the venue listed below.      Follow Up Recommendations SNF (if SNF not approved, will need to max out [place] services (HHPT))    Equipment Recommendations  Rolling walker with 5" wheels    Recommendations for Other Services OT consult     Precautions / Restrictions Precautions Precautions: Fall Precaution Comments: pt reports dizziness with positional change, unable to obtain orthostatics this session Restrictions Weight Bearing Restrictions: No      Mobility  Bed Mobility Overal bed mobility: Needs Assistance Bed Mobility: Supine to Sit     Supine to sit: Max assist;HOB elevated     General bed mobility comments: Pt required cues for sequencing and to advance BLEs to EOB.  She reaches out for this PTs hand to pull up to sitting and requires max assist to achieve  partial sitting before pt saying, "I can't do this, not today" and quickly returns to supine.  Transfers                 General transfer comment: Unable to assess  Ambulation/Gait                Stairs            Wheelchair Mobility    Modified [name] (Stroke Patients Only)       Balance                                             Pertinent Vitals/Pain Pain Assessment: 0-10 Pain Score: 5  Pain Location: L chest and buttocks with mobility Pain Descriptors / Indicators: Aching;Sharp;Discomfort Pain Intervention(s): Limited activity within patient's tolerance;Monitored during session;Repositioned;Other (comment) (RN notified of pt's c/o chest pain)    Home Living Family/patient expects to be discharged to:: Private residence Living Arrangements: Alone Available Help at Discharge: Personal care attendant (pt reports she does not have any family or friends to assist) Type of Home: Assisted living Home Access: Elevator;Level entry     Home Layout: One level Home Equipment: Wheelchair - power      Prior Function Level of Independence: Needs assistance   Gait / Transfers Assistance Needed: Pt reports the last time she ambulated was a few months ago.  She stands  and pivots to her powered WC without assist.  ADL's / Homemaking Assistance Needed: Has a care attendant who is supposed to come every day for a few hours but pt says she does not come when scheduled and when she does come she does not assist pt.  Pt independently takes a sponge bath but admits that she is unable to reach all areas.  Meals are provided by ALF and are delivered to her apartment.        Hand Dominance        Extremity/Trunk Assessment   Upper Extremity Assessment: Generalized weakness;RUE deficits/detail RUE Deficits / Details: pt reports h/o R RTC tear and shoulder flexion limited to ~70 deg before onset of pain         Lower Extremity Assessment:  Generalized weakness      Cervical / Trunk Assessment: Kyphotic  Communication   Communication: HOH  Cognition Arousal/Alertness: Awake/alert Behavior During Therapy: Anxious Overall Cognitive Status: Within Functional Limits for tasks assessed                      General Comments General comments (skin integrity, edema, etc.): Pt c/o sharp L chest pain with exertion and RN notified    Exercises General Exercises - Lower Extremity Ankle Circles/Pumps: AROM;Both;10 reps;Supine Quad Sets: Strengthening;Both;10 reps;Supine Gluteal Sets: Strengthening;Both;10 reps;Supine Heel Slides: Strengthening;Both;10 reps;Supine Straight Leg Raises: Strengthening;Both;10 reps;Supine   Assessment/Plan    PT Assessment Patient needs continued PT services  PT Problem List Decreased strength;Decreased range of motion;Decreased activity tolerance;Decreased balance;Decreased mobility;Decreased knowledge of use of DME;Decreased safety awareness;Cardiopulmonary status limiting activity;Pain;Obesity          PT Treatment Interventions DME instruction;Functional mobility training;Therapeutic activities;Therapeutic exercise;Balance training;Neuromuscular re-education;Patient/family education;Modalities;Wheelchair mobility training    PT Goals (Current goals can be found in the Care Plan section)  Acute Rehab PT Goals Patient Stated Goal: to feel better and to get more help at home PT Goal Formulation: With patient Time For Goal Achievement: 07/31/16 Potential to Achieve Goals: Fair    Frequency Min 2X/week   Barriers to discharge Decreased caregiver support No assist available at ALF    Co-evaluation               End of Session Equipment Utilized During Treatment: Oxygen Activity Tolerance: Patient limited by fatigue;Patient limited by pain Patient left: in bed;with call bell/phone within reach Nurse Communication: Mobility status;Other (comment) (pt c/o chest pain)     Functional Assessment Tool Used: Clinical Judgement Functional Limitation: Mobility: Walking and moving around Mobility: Walking and Moving Around Current Status [id number]): At least 80 percent but less than 100 percent impaired, limited or restricted Mobility: Walking and Moving Around Goal Status [phone]): At least 60 percent but less than 80 percent impaired, limited or restricted    Time: 0832-0850 PT Time Calculation (min) (ACUTE ONLY): 18 min   Charges:   PT Evaluation $PT Eval Low Complexity: 1 Procedure     PT G Codes:   PT G-Codes **NOT FOR INPATIENT CLASS** Functional Assessment Tool Used: Clinical Judgement Functional Limitation: Mobility: Walking and moving around Mobility: Walking and Moving Around Current Status [id number]): At least 80 percent but less than 100 percent impaired, limited or restricted Mobility: Walking and Moving Around Goal Status [phone]): At least 60 percent but less than 80 percent impaired, limited or restricted    Collie Siad PT, DPT 07/17/2016, 9:16 AM

## 2016-07-17 NOTE — ED Notes (Signed)
Attempted to call [facility] for report. No answer.

## 2016-07-17 NOTE — ED Notes (Signed)
Pt bathed, resting in bed

## 2016-07-17 NOTE — ED Notes (Signed)
Wheel present to left forearm. Purple skin marker outlining area.

## 2016-07-17 NOTE — Care Management Note (Signed)
Case Management Note  Patient Details  Name: Kaitlin Henderson MRN: [id number] Date of Birth: 12-Dec-1940  Subjective/Objective:   Verified with jason at [place] [facility] , that the patient was contacted several times to set up services. They spoke to the patient on 10/12th and at that time the patient refused services.  The PCP listed with them is Dr Baxter Flattery at [facility] -[phone].                Action/Plan:   Expected Discharge Date:                  Expected Discharge Plan:     In-House Referral:     Discharge planning Services     Post Acute Care Choice:    Choice offered to:     DME Arranged:    DME Agency:     HH Arranged:    HH Agency:     Status of Service:     If discussed at [employer] of Stay Meetings, dates discussed:    Additional Comments:  Beau Fanny, RN 07/17/2016, 10:00 AM

## 2016-07-17 NOTE — ED Notes (Signed)
Patient placed in paper scrubs for discharge.

## 2016-07-17 NOTE — ED Notes (Signed)
Patient here for placement. Will be returning to the [facility] once O2 is set up. Patient A&O x4.

## 2016-07-17 NOTE — ED Notes (Signed)
Patient eating lunch at this time.

## 2016-07-17 NOTE — ED Notes (Signed)
PT in room working with pt.

## 2016-07-17 NOTE — Progress Notes (Addendum)
11:16 AM [facility] has accepted patient per DSS.  [facility] will come and pick her up in the ED. CM made aware and will notify home health.  No other needs.    11:01 AM LCSW has updated APS and they are actively working on getting patient placed in ALF [facility]. APS will complete placement at this time and working on getting someone from the [facility] to see patient and complete face to face to see if patient is appropriate. Will assist APS in placement if needs arise with documentation.      Patient has Medicare as only payer source and does not have admission criteria for inpatient. Patient will not be able to go to SNF unless it is private pay.  Medical Unit CSWs have worked with patient in the past and have placed her several times and explained the importance of applying for medicaid, most recently on 06/01/16:  The CSW advised the patient to reconsider applying for Medicaid, citing that it could help with outpatient visits and medication. The patient became tearful and reported that she refuses to go to a SNF even for STR, and that she does not want to be "handicapped". Patient remains to not have medicaid in place at this time.   Patient to return to current living situation at [facility]. DSS to follow up with patient.  If other needs arise, please re-consult or call.  Lane Hacker, MSW Clinical Social Work: [employer] Cover[employer] for :  [phone]

## 2016-07-17 NOTE — ED Notes (Signed)
Pt ate supper ,resting in bed, awaiting transport to the [facility], VSS, call bell in reach

## 2016-07-17 NOTE — ED Notes (Signed)
Pt was discharged to "[facility]" on 5 liters O2, EMS transported pt and when they arrived the pts concentrator was malfunctioning and "the [facility]" is unable to provide oxygen. EMS calling the ER for direction whether to bring the patient back.  I called and spoke to Beau Fanny RN ( Case Manger ) , she contacted [facility] to see what they could provide in a timely manner, per Malachy Mood it will be at least 4 hours before they could arrive. The patient will be transported back to the ER under the same account/ and visit reason as stated on this account.  The patient will return to "the [facility]" when oxygen is correctly set up.

## 2016-12-19 ENCOUNTER — Inpatient Hospital Stay

## 2016-12-19 ENCOUNTER — Emergency Department

## 2016-12-19 ENCOUNTER — Encounter: Payer: Self-pay | Admitting: *Deleted

## 2016-12-19 ENCOUNTER — Inpatient Hospital Stay
Admission: EM | Admit: 2016-12-19 | Discharge: 2016-12-31 | DRG: 870 | Disposition: E | Attending: Internal Medicine | Admitting: Internal Medicine

## 2016-12-19 DIAGNOSIS — I252 Old myocardial infarction: Secondary | ICD-10-CM

## 2016-12-19 DIAGNOSIS — J440 Chronic obstructive pulmonary disease with acute lower respiratory infection: Secondary | ICD-10-CM | POA: Diagnosis present

## 2016-12-19 DIAGNOSIS — Z9011 Acquired absence of right breast and nipple: Secondary | ICD-10-CM

## 2016-12-19 DIAGNOSIS — E785 Hyperlipidemia, unspecified: Secondary | ICD-10-CM | POA: Diagnosis present

## 2016-12-19 DIAGNOSIS — R34 Anuria and oliguria: Secondary | ICD-10-CM | POA: Diagnosis not present

## 2016-12-19 DIAGNOSIS — E039 Hypothyroidism, unspecified: Secondary | ICD-10-CM | POA: Diagnosis present

## 2016-12-19 DIAGNOSIS — J9600 Acute respiratory failure, unspecified whether with hypoxia or hypercapnia: Secondary | ICD-10-CM

## 2016-12-19 DIAGNOSIS — G250 Essential tremor: Secondary | ICD-10-CM | POA: Diagnosis present

## 2016-12-19 DIAGNOSIS — E1165 Type 2 diabetes mellitus with hyperglycemia: Secondary | ICD-10-CM | POA: Diagnosis present

## 2016-12-19 DIAGNOSIS — F419 Anxiety disorder, unspecified: Secondary | ICD-10-CM | POA: Diagnosis present

## 2016-12-19 DIAGNOSIS — I2510 Atherosclerotic heart disease of native coronary artery without angina pectoris: Secondary | ICD-10-CM | POA: Diagnosis present

## 2016-12-19 DIAGNOSIS — Z9911 Dependence on respirator [ventilator] status: Secondary | ICD-10-CM | POA: Diagnosis not present

## 2016-12-19 DIAGNOSIS — K219 Gastro-esophageal reflux disease without esophagitis: Secondary | ICD-10-CM | POA: Diagnosis present

## 2016-12-19 DIAGNOSIS — J189 Pneumonia, unspecified organism: Secondary | ICD-10-CM | POA: Diagnosis present

## 2016-12-19 DIAGNOSIS — Z9049 Acquired absence of other specified parts of digestive tract: Secondary | ICD-10-CM

## 2016-12-19 DIAGNOSIS — Z515 Encounter for palliative care: Secondary | ICD-10-CM

## 2016-12-19 DIAGNOSIS — R6521 Severe sepsis with septic shock: Secondary | ICD-10-CM | POA: Diagnosis not present

## 2016-12-19 DIAGNOSIS — Z9981 Dependence on supplemental oxygen: Secondary | ICD-10-CM

## 2016-12-19 DIAGNOSIS — Z87891 Personal history of nicotine dependence: Secondary | ICD-10-CM

## 2016-12-19 DIAGNOSIS — A419 Sepsis, unspecified organism: Principal | ICD-10-CM

## 2016-12-19 DIAGNOSIS — Z881 Allergy status to other antibiotic agents status: Secondary | ICD-10-CM

## 2016-12-19 DIAGNOSIS — J441 Chronic obstructive pulmonary disease with (acute) exacerbation: Secondary | ICD-10-CM

## 2016-12-19 DIAGNOSIS — I4891 Unspecified atrial fibrillation: Secondary | ICD-10-CM | POA: Diagnosis not present

## 2016-12-19 DIAGNOSIS — Z794 Long term (current) use of insulin: Secondary | ICD-10-CM

## 2016-12-19 DIAGNOSIS — J9621 Acute and chronic respiratory failure with hypoxia: Secondary | ICD-10-CM | POA: Diagnosis present

## 2016-12-19 DIAGNOSIS — Z90710 Acquired absence of both cervix and uterus: Secondary | ICD-10-CM

## 2016-12-19 DIAGNOSIS — R918 Other nonspecific abnormal finding of lung field: Secondary | ICD-10-CM | POA: Diagnosis not present

## 2016-12-19 DIAGNOSIS — E876 Hypokalemia: Secondary | ICD-10-CM | POA: Diagnosis not present

## 2016-12-19 DIAGNOSIS — Z66 Do not resuscitate: Secondary | ICD-10-CM | POA: Diagnosis not present

## 2016-12-19 DIAGNOSIS — E872 Acidosis: Secondary | ICD-10-CM | POA: Diagnosis present

## 2016-12-19 DIAGNOSIS — Z95810 Presence of automatic (implantable) cardiac defibrillator: Secondary | ICD-10-CM

## 2016-12-19 DIAGNOSIS — E662 Morbid (severe) obesity with alveolar hypoventilation: Secondary | ICD-10-CM | POA: Diagnosis present

## 2016-12-19 DIAGNOSIS — R748 Abnormal levels of other serum enzymes: Secondary | ICD-10-CM | POA: Diagnosis present

## 2016-12-19 DIAGNOSIS — J9622 Acute and chronic respiratory failure with hypercapnia: Secondary | ICD-10-CM | POA: Diagnosis present

## 2016-12-19 DIAGNOSIS — R7989 Other specified abnormal findings of blood chemistry: Secondary | ICD-10-CM

## 2016-12-19 DIAGNOSIS — G9341 Metabolic encephalopathy: Secondary | ICD-10-CM | POA: Diagnosis not present

## 2016-12-19 DIAGNOSIS — J9602 Acute respiratory failure with hypercapnia: Secondary | ICD-10-CM

## 2016-12-19 DIAGNOSIS — D72829 Elevated white blood cell count, unspecified: Secondary | ICD-10-CM

## 2016-12-19 DIAGNOSIS — J9690 Respiratory failure, unspecified, unspecified whether with hypoxia or hypercapnia: Secondary | ICD-10-CM

## 2016-12-19 DIAGNOSIS — Z9221 Personal history of antineoplastic chemotherapy: Secondary | ICD-10-CM

## 2016-12-19 DIAGNOSIS — J9601 Acute respiratory failure with hypoxia: Secondary | ICD-10-CM

## 2016-12-19 DIAGNOSIS — Z885 Allergy status to narcotic agent status: Secondary | ICD-10-CM

## 2016-12-19 DIAGNOSIS — R778 Other specified abnormalities of plasma proteins: Secondary | ICD-10-CM

## 2016-12-19 DIAGNOSIS — Z4659 Encounter for fitting and adjustment of other gastrointestinal appliance and device: Secondary | ICD-10-CM

## 2016-12-19 DIAGNOSIS — I499 Cardiac arrhythmia, unspecified: Secondary | ICD-10-CM

## 2016-12-19 DIAGNOSIS — F329 Major depressive disorder, single episode, unspecified: Secondary | ICD-10-CM | POA: Diagnosis present

## 2016-12-19 DIAGNOSIS — J449 Chronic obstructive pulmonary disease, unspecified: Secondary | ICD-10-CM | POA: Diagnosis not present

## 2016-12-19 DIAGNOSIS — R911 Solitary pulmonary nodule: Secondary | ICD-10-CM | POA: Diagnosis present

## 2016-12-19 DIAGNOSIS — Z79899 Other long term (current) drug therapy: Secondary | ICD-10-CM

## 2016-12-19 DIAGNOSIS — Z7982 Long term (current) use of aspirin: Secondary | ICD-10-CM

## 2016-12-19 DIAGNOSIS — R0600 Dyspnea, unspecified: Secondary | ICD-10-CM

## 2016-12-19 DIAGNOSIS — Z853 Personal history of malignant neoplasm of breast: Secondary | ICD-10-CM

## 2016-12-19 DIAGNOSIS — Z6831 Body mass index (BMI) 31.0-31.9, adult: Secondary | ICD-10-CM

## 2016-12-19 DIAGNOSIS — J188 Other pneumonia, unspecified organism: Secondary | ICD-10-CM

## 2016-12-19 DIAGNOSIS — Z7951 Long term (current) use of inhaled steroids: Secondary | ICD-10-CM

## 2016-12-19 LAB — URINALYSIS, COMPLETE (UACMP) WITH MICROSCOPIC
Bacteria, UA: NONE SEEN
Bilirubin Urine: NEGATIVE
GLUCOSE, UA: NEGATIVE mg/dL
Hgb urine dipstick: NEGATIVE
Ketones, ur: 20 mg/dL — AB
Leukocytes, UA: NEGATIVE
Nitrite: NEGATIVE
PH: 5 (ref 5.0–8.0)
PROTEIN: 30 mg/dL — AB

## 2016-12-19 LAB — COMPREHENSIVE METABOLIC PANEL
ALK PHOS: 70 U/L (ref 38–126)
ALT: 15 U/L (ref 14–54)
ANION GAP: 9 (ref 5–15)
AST: 19 U/L (ref 15–41)
Albumin: 3.2 g/dL — ABNORMAL LOW (ref 3.5–5.0)
BILIRUBIN TOTAL: 0.8 mg/dL (ref 0.3–1.2)
BUN: 29 mg/dL — ABNORMAL HIGH (ref 6–20)
CALCIUM: 9.4 mg/dL (ref 8.9–10.3)
CO2: 38 mmol/L — ABNORMAL HIGH (ref 22–32)
CREATININE: 0.89 mg/dL (ref 0.44–1.00)
Chloride: 98 mmol/L — ABNORMAL LOW (ref 101–111)
Glucose, Bld: 158 mg/dL — ABNORMAL HIGH (ref 65–99)
Potassium: 4.3 mmol/L (ref 3.5–5.1)
SODIUM: 145 mmol/L (ref 135–145)
TOTAL PROTEIN: 8.3 g/dL — AB (ref 6.5–8.1)

## 2016-12-19 LAB — BLOOD GAS, ARTERIAL
ACID-BASE EXCESS: 10.8 mmol/L — AB (ref 0.0–2.0)
Acid-Base Excess: 2.9 mmol/L — ABNORMAL HIGH (ref 0.0–2.0)
BICARBONATE: 42.7 mmol/L — AB (ref 20.0–28.0)
BICARBONATE: 30.2 mmol/L — AB (ref 20.0–28.0)
Delivery systems: POSITIVE
Expiratory PAP: 8
FIO2: 1
FIO2: 0.6
Inspiratory PAP: 16
MECHVT: 500 mL
Mechanical Rate: 8
O2 SAT: 99.8 %
O2 Saturation: 93 %
PATIENT TEMPERATURE: 37
PCO2 ART: 102 mmHg — AB (ref 32.0–48.0)
PEEP: 5 cmH2O
PO2 ART: 259 mmHg — AB (ref 83.0–108.0)
Patient temperature: 37
RATE: 20 resp/min
pCO2 arterial: 56 mmHg — ABNORMAL HIGH (ref 32.0–48.0)
pH, Arterial: 7.23 — ABNORMAL LOW (ref 7.350–7.450)
pH, Arterial: 7.34 — ABNORMAL LOW (ref 7.350–7.450)
pO2, Arterial: 71 mmHg — ABNORMAL LOW (ref 83.0–108.0)

## 2016-12-19 LAB — APTT: aPTT: 29 seconds (ref 24–36)

## 2016-12-19 LAB — CBC WITH DIFFERENTIAL/PLATELET
Basophils Absolute: 0 10*3/uL (ref 0–0.1)
Basophils Relative: 0 %
EOS ABS: 0 10*3/uL (ref 0–0.7)
EOS PCT: 0 %
HEMATOCRIT: 51.6 % — AB (ref 35.0–47.0)
HEMOGLOBIN: 16.7 g/dL — AB (ref 12.0–16.0)
Lymphocytes Relative: 8 %
Lymphs Abs: 0.9 10*3/uL — ABNORMAL LOW (ref 1.0–3.6)
MCH: 31.6 pg (ref 26.0–34.0)
MCHC: 32.4 g/dL (ref 32.0–36.0)
MCV: 97.6 fL (ref 80.0–100.0)
MONO ABS: 0.9 10*3/uL (ref 0.2–0.9)
Monocytes Relative: 8 %
Neutro Abs: 9.3 10*3/uL — ABNORMAL HIGH (ref 1.4–6.5)
Neutrophils Relative %: 84 %
Platelets: 367 10*3/uL (ref 150–440)
RBC: 5.29 MIL/uL — ABNORMAL HIGH (ref 3.80–5.20)
RDW: 15.1 % — AB (ref 11.5–14.5)
WBC: 11.2 10*3/uL — AB (ref 3.6–11.0)

## 2016-12-19 LAB — LACTIC ACID, PLASMA
Lactic Acid, Venous: 1.1 mmol/L (ref 0.5–1.9)
Lactic Acid, Venous: 1.2 mmol/L (ref 0.5–1.9)

## 2016-12-19 LAB — TROPONIN I
TROPONIN I: 0.03 ng/mL — AB (ref <0.03)
Troponin I: <0.03 ng/mL (ref <0.03)

## 2016-12-19 LAB — LIPASE, BLOOD: LIPASE: 15 U/L (ref 11–51)

## 2016-12-19 LAB — GLUCOSE, CAPILLARY: GLUCOSE-CAPILLARY: 217 mg/dL — AB (ref 65–99)

## 2016-12-19 LAB — PROTIME-INR
INR: 1.17
Prothrombin Time: 15 seconds (ref 11.4–15.2)

## 2016-12-19 LAB — PROCALCITONIN: Procalcitonin: 0.23 ng/mL

## 2016-12-19 LAB — MRSA PCR SCREENING: MRSA BY PCR: NEGATIVE

## 2016-12-19 MED ORDER — ROCURONIUM BROMIDE 50 MG/5ML IV SOLN
1.0000 mg/kg | Freq: Once | INTRAVENOUS | Status: AC
  Administered 2016-12-19: 90.7 mg via INTRAVENOUS
  Filled 2016-12-19: qty 9.07

## 2016-12-19 MED ORDER — IPRATROPIUM-ALBUTEROL 0.5-2.5 (3) MG/3ML IN SOLN
3.0000 mL | RESPIRATORY_TRACT | Status: DC
  Administered 2016-12-19 – 2016-12-20 (×4): 3 mL via RESPIRATORY_TRACT
  Filled 2016-12-19 (×5): qty 3

## 2016-12-19 MED ORDER — MAGNESIUM SULFATE IN D5W 1-5 GM/100ML-% IV SOLN
1.0000 g | INTRAVENOUS | Status: AC
  Administered 2016-12-19: 1 g via INTRAVENOUS
  Filled 2016-12-19 (×2): qty 100

## 2016-12-19 MED ORDER — METHYLPREDNISOLONE SODIUM SUCC 125 MG IJ SOLR
60.0000 mg | Freq: Four times a day (QID) | INTRAMUSCULAR | Status: DC
  Administered 2016-12-19 – 2016-12-20 (×4): 60 mg via INTRAVENOUS
  Filled 2016-12-19 (×4): qty 2

## 2016-12-19 MED ORDER — INSULIN ASPART 100 UNIT/ML ~~LOC~~ SOLN
0.0000 [IU] | Freq: Every day | SUBCUTANEOUS | Status: DC
  Administered 2016-12-19: 2 [IU] via SUBCUTANEOUS
  Filled 2016-12-19: qty 2

## 2016-12-19 MED ORDER — MIDAZOLAM HCL 2 MG/2ML IJ SOLN
1.0000 mg | INTRAMUSCULAR | Status: DC | PRN
  Administered 2016-12-20 – 2016-12-21 (×4): 1 mg via INTRAVENOUS
  Filled 2016-12-19 (×5): qty 2

## 2016-12-19 MED ORDER — VANCOMYCIN HCL IN DEXTROSE 1-5 GM/200ML-% IV SOLN
1000.0000 mg | Freq: Once | INTRAVENOUS | Status: AC
  Administered 2016-12-19: 1000 mg via INTRAVENOUS
  Filled 2016-12-19: qty 200

## 2016-12-19 MED ORDER — FAMOTIDINE IN NACL 20-0.9 MG/50ML-% IV SOLN
20.0000 mg | Freq: Two times a day (BID) | INTRAVENOUS | Status: DC
  Administered 2016-12-19 – 2016-12-22 (×6): 20 mg via INTRAVENOUS
  Filled 2016-12-19 (×6): qty 50

## 2016-12-19 MED ORDER — CHLORHEXIDINE GLUCONATE 0.12% ORAL RINSE (MEDLINE KIT)
15.0000 mL | Freq: Two times a day (BID) | OROMUCOSAL | Status: DC
  Administered 2016-12-19 – 2016-12-21 (×5): 15 mL via OROMUCOSAL

## 2016-12-19 MED ORDER — IPRATROPIUM-ALBUTEROL 0.5-2.5 (3) MG/3ML IN SOLN
3.0000 mL | Freq: Once | RESPIRATORY_TRACT | Status: AC
  Administered 2016-12-19: 3 mL via RESPIRATORY_TRACT
  Filled 2016-12-19: qty 3

## 2016-12-19 MED ORDER — MIDAZOLAM HCL 2 MG/2ML IJ SOLN
1.0000 mg | INTRAMUSCULAR | Status: AC | PRN
  Administered 2016-12-19 – 2016-12-20 (×3): 1 mg via INTRAVENOUS
  Filled 2016-12-19 (×4): qty 2

## 2016-12-19 MED ORDER — VANCOMYCIN HCL IN DEXTROSE 750-5 MG/150ML-% IV SOLN
750.0000 mg | Freq: Two times a day (BID) | INTRAVENOUS | Status: DC
  Administered 2016-12-19 – 2016-12-20 (×2): 750 mg via INTRAVENOUS
  Filled 2016-12-19 (×3): qty 150

## 2016-12-19 MED ORDER — ONDANSETRON HCL 4 MG/2ML IJ SOLN: 4.0000 mg | Freq: Four times a day (QID) | INTRAMUSCULAR | Status: DC | PRN

## 2016-12-19 MED ORDER — CEFEPIME HCL 2 G IJ SOLR
2.0000 g | Freq: Once | INTRAMUSCULAR | Status: AC
  Administered 2016-12-19: 2 g via INTRAVENOUS
  Filled 2016-12-19: qty 2

## 2016-12-19 MED ORDER — SODIUM CHLORIDE 0.9 % IV BOLUS (SEPSIS)
1000.0000 mL | Freq: Once | INTRAVENOUS | Status: AC
  Administered 2016-12-19: 1000 mL via INTRAVENOUS

## 2016-12-19 MED ORDER — ALBUTEROL SULFATE (2.5 MG/3ML) 0.083% IN NEBU
5.0000 mg | INHALATION_SOLUTION | Freq: Once | RESPIRATORY_TRACT | Status: AC
  Administered 2016-12-19: 5 mg via RESPIRATORY_TRACT
  Filled 2016-12-19: qty 6

## 2016-12-19 MED ORDER — DEXTROSE 5 % IV SOLN
2.0000 g | Freq: Two times a day (BID) | INTRAVENOUS | Status: DC
  Administered 2016-12-20 (×2): 2 g via INTRAVENOUS
  Filled 2016-12-19 (×3): qty 2

## 2016-12-19 MED ORDER — KETAMINE HCL 10 MG/ML IJ SOLN
INTRAMUSCULAR | Status: AC
  Administered 2016-12-19: 150 mg
  Filled 2016-12-19: qty 1

## 2016-12-19 MED ORDER — ONDANSETRON HCL 4 MG PO TABS: 4.0000 mg | ORAL_TABLET | Freq: Four times a day (QID) | ORAL | Status: DC | PRN

## 2016-12-19 MED ORDER — INSULIN DETEMIR 100 UNIT/ML ~~LOC~~ SOLN: 28.0000 [IU] | Freq: Every day | SUBCUTANEOUS | Status: DC

## 2016-12-19 MED ORDER — FENTANYL CITRATE (PF) 100 MCG/2ML IJ SOLN
50.0000 ug | INTRAMUSCULAR | Status: AC | PRN
  Administered 2016-12-19 – 2016-12-20 (×3): 50 ug via INTRAVENOUS
  Filled 2016-12-19 (×5): qty 2

## 2016-12-19 MED ORDER — BUDESONIDE 0.5 MG/2ML IN SUSP
0.5000 mg | Freq: Two times a day (BID) | RESPIRATORY_TRACT | Status: DC
  Administered 2016-12-20: 0.5 mg via RESPIRATORY_TRACT
  Filled 2016-12-19: qty 2

## 2016-12-19 MED ORDER — IOPAMIDOL (ISOVUE-370) INJECTION 76%
75.0000 mL | Freq: Once | INTRAVENOUS | Status: AC | PRN
  Administered 2016-12-19: 75 mL via INTRAVENOUS

## 2016-12-19 MED ORDER — METHYLPREDNISOLONE SODIUM SUCC 125 MG IJ SOLR
125.0000 mg | Freq: Once | INTRAMUSCULAR | Status: AC
  Administered 2016-12-19: 125 mg via INTRAVENOUS
  Filled 2016-12-19: qty 2

## 2016-12-19 MED ORDER — FENTANYL CITRATE (PF) 100 MCG/2ML IJ SOLN
50.0000 ug | INTRAMUSCULAR | Status: DC | PRN
  Administered 2016-12-20 – 2016-12-21 (×7): 50 ug via INTRAVENOUS
  Filled 2016-12-19 (×5): qty 2

## 2016-12-19 MED ORDER — ASPIRIN 325 MG PO TABS
325.0000 mg | ORAL_TABLET | Freq: Every day | ORAL | Status: DC
  Administered 2016-12-21 – 2016-12-24 (×4): 325 mg via ORAL
  Filled 2016-12-19 (×5): qty 1

## 2016-12-19 MED ORDER — ENOXAPARIN SODIUM 40 MG/0.4ML ~~LOC~~ SOLN
40.0000 mg | SUBCUTANEOUS | Status: DC
  Administered 2016-12-19: 40 mg via SUBCUTANEOUS
  Filled 2016-12-19: qty 0.4

## 2016-12-19 MED ORDER — BUDESONIDE 0.5 MG/2ML IN SUSP
0.5000 mg | Freq: Two times a day (BID) | RESPIRATORY_TRACT | Status: DC
  Administered 2016-12-19: 0.5 mg via RESPIRATORY_TRACT
  Filled 2016-12-19: qty 2

## 2016-12-19 MED ORDER — ORAL CARE MOUTH RINSE
15.0000 mL | Freq: Four times a day (QID) | OROMUCOSAL | Status: DC
  Administered 2016-12-20 – 2016-12-21 (×5): 15 mL via OROMUCOSAL

## 2016-12-19 MED ORDER — SODIUM CHLORIDE 0.9% FLUSH
3.0000 mL | Freq: Two times a day (BID) | INTRAVENOUS | Status: DC
  Administered 2016-12-19 – 2016-12-24 (×10): 3 mL via INTRAVENOUS

## 2016-12-19 MED ORDER — INSULIN ASPART 100 UNIT/ML ~~LOC~~ SOLN
0.0000 [IU] | Freq: Three times a day (TID) | SUBCUTANEOUS | Status: DC
  Administered 2016-12-20: 7 [IU] via SUBCUTANEOUS
  Filled 2016-12-19: qty 7

## 2016-12-19 NOTE — ED Triage Notes (Signed)
Pt from the [facility] via EMS with resp distress, 02 sat on 5Liters 77%, pt is alert will open eyes, pt will not answer questions, crackles and wheezes heard in lungs, pt is pale and diaphoretic

## 2016-12-19 NOTE — Progress Notes (Signed)
ED visit made. Patient is currently followed by [facility] at [facility] ALF with a hospice diagnosis of COPD. She is a FULL CODE. She was sent to the ED for evaluation of decreased oxygen saturations and cough. Hospice RN at the facility to assess, found the patient with decreased oxygen saturations and increased work of breathing. Code status was addressed with the patient prior to transport, patient able to express that  she "wanted everything done". She does not have a HCPOA. Patient's attending physician is Dr. Alicia Amel. Patient seen lying on the ED stretcher, required intubation when Bipap failed. She will be admitted to the ICU and started on IV antibiotics and IV fluids. Per staff RN Brayton Layman patient's friend Zigmund Daniel was at bedside earlier, no one present at time of writer's visit. Will continue to follow and update hospice team. Transfer summary in place on patient's chart. [name] RN, BSN, [place] [facility] hospital  Liaison [phone]

## 2016-12-19 NOTE — ED Notes (Signed)
96% with bagging at this time prior to intubation.

## 2016-12-19 NOTE — Progress Notes (Signed)
Pharmacy Antibiotic Note  Kaitlin Henderson is a 76 y.o. female admitted on 12/12/2016 with pneumonia.  Pharmacy has been consulted for cefepime and vancomycin  dosing.  Plan: Cefepime 2 g iv q 12 hours.   Vancomycin 1000 mg iv once then 750 mg iv q 12 hours with stacked dosing- goal trough of 15-20 mcg/ml. Will adjust dosing/order levels as clinically indicated if vancomycin is continued.   Height: 5\' 5"  (165.1 cm) Weight: 200 lb (90.7 kg) IBW/kg (Calculated) : 57  Temp (24hrs), Avg:98.7 F (37.1 C), Min:98.7 F (37.1 C), Max:98.7 F (37.1 C)   Recent Labs Lab 12/03/2016 1316 12/28/2016 1614  WBC 11.2*  --   CREATININE 0.89  --   LATICACIDVEN 1.1 1.2    Estimated Creatinine Clearance: 59.9 mL/min (by C-G formula based on SCr of 0.89 mg/dL).    Allergies  Allergen Reactions  . Codeine Other (See Comments)    Pt states that this medication makes her pass out.   . Levofloxacin Other (See Comments)    Reaction:  Redness   . Sulfa Antibiotics Swelling    Antimicrobials this admission: cefepime 4/19 >>  vancomycin 4/19 >>   Dose adjustments this admission:   Microbiology results: 4/19 BCx: sent 4/19 UCx: sent  Sputum: sent   Thank you for allowing pharmacy to be a part of this patient's care.  Napoleon Form 12/15/2016 6:19 PM

## 2016-12-19 NOTE — ED Notes (Addendum)
edp with verbal orders to intubate pt with 90mg  of ROC and 150mg  of Ketamine. resp tech and other staff RN at bedside .  Pt placed on zoll monitor.

## 2016-12-19 NOTE — ED Notes (Signed)
Upper and lower dentures removed by resp as pt is on bipap. Put in cup with pt label.

## 2016-12-19 NOTE — H&P (Addendum)
[facility] NAME: Kaitlin Henderson    MR#:  [id number]  DATE OF BIRTH:  April 02, 1941  DATE OF ADMISSION:  12/28/2016  PRIMARY CARE PHYSICIAN: No PCP Per Patient   REQUESTING/REFERRING PHYSICIAN:   CHIEF COMPLAINT:   Chief Complaint  Patient presents with  . Code Sepsis  . Respiratory Distress    HISTORY OF PRESENT ILLNESS: Kaitlin Henderson  is a 76 y.o. female with a known history of Chronic respiratory failure, COPD, diabetes mellitus, who was brought from skilled nursing facility with respiratory distress, her oxygen saturations were 77% on 5 L of oxygen, patient initially was alert and was able to open her eyes, however, was not able to answer questions, wheezes or crackles were heard bilaterally in lung area, patient was pale and diaphoretic, she was initiated on BiPAP, ABGs revealed significant CO2 elevation, at 102, pH was 7.23. Patient was continued on BiPAP, however, became more obtunded, averages are being repeated now, possible intubation if no improvement. Hospitalist services were contacted for admission. Review of systems is available as patient is obtunded  PAST MEDICAL HISTORY:   Past Medical History:  Diagnosis Date  . Anginal pain ([place])   . Aphthous stomatitis   . APHTHOUS STOMATITIS   . Breast cancer, right breast ([place]) 1998   s/p chemo & XRT; right mastectomy  . Breast cancer, right breast ([place]) 1998   s/p chemo & XRT, right mastectomy  . C O P D    chronic O2 3LPM   . CHF (congestive heart failure) ([place])   . COPD (chronic obstructive pulmonary disease) ([place])   . Coronary artery disease   . CORONARY ARTERY DISEASE   . Depression   . DEPRESSION    started sertraline 09/2010  . Diabetes mellitus, type 2 ([place]) 03/2010  . DIABETES, TYPE 2 dx 03/2010  . Essential tremor   . GERD (gastroesophageal reflux disease)   . Hyperlipemia   . HYPERLIPIDEMIA   . Hypothyroidism   . NSTEMI (non-ST elevated  myocardial infarction) ([place]) 01/2012  . NSTEMI (non-ST elevated myocardial infarction) ([place]) 01/2012   med mgmt  . Obesity   . OBESITY     PAST SURGICAL HISTORY: Past Surgical History:  Procedure Laterality Date  . ABDOMINAL HYSTERECTOMY    . APPENDECTOMY    . CHOLECYSTECTOMY    . MASTECTOMY    . right mastectomy    . Right Mastectomy    . TONSILLECTOMY      SOCIAL HISTORY:  Social History  Substance Use Topics  . Smoking status: Former Smoker    Packs/day: 2.50    Years: 54.00    Types: Cigarettes    Quit date: 09/02/1997  . Smokeless tobacco: Never Used     Comment: Retired. Widow/widower since 2008-lives alone. Home hospice since 06/2009 related to COPD  . Alcohol use No    FAMILY HISTORY:  Family History  Problem Relation Age of Onset  . Diabetes Neg Hx   . Cancer Neg Hx     DRUG ALLERGIES:  Allergies  Allergen Reactions  . Codeine Other (See Comments)    Pt states that this medication makes her pass out.   . Levofloxacin Other (See Comments)    Reaction:  Redness   . Sulfa Antibiotics Swelling    Review of Systems  Unable to perform ROS: Patient unresponsive    MEDICATIONS AT HOME:  Prior to Admission medications   Medication Sig Start Date End  Date Taking? Authorizing Provider  ALPRAZolam (XANAX) 0.25 MG tablet Take 0.25 mg by mouth 2 (two) times daily.   Yes Historical Provider, MD  aspirin 325 MG tablet Take 325 mg by mouth 2 (two) times daily.    Yes Historical Provider, MD  budesonide (PULMICORT) 0.5 MG/2ML nebulizer solution Take 0.5 mg by nebulization 2 (two) times daily.   Yes Historical Provider, MD  clonazePAM (KLONOPIN) 0.5 MG tablet Take 0.5 mg by mouth at bedtime.   Yes Historical Provider, MD  insulin aspart (NOVOLOG) 100 UNIT/ML injection Inject 0-20 Units into the skin 3 (three) times daily with meals. 05/09/16  Yes Fritzi Mandes, MD  insulin detemir (LEVEMIR) 100 UNIT/ML injection Inject 0.25 mLs (25 Units total) into the skin at  bedtime. Patient taking differently: Inject 28 Units into the skin at bedtime.  05/09/16  Yes Fritzi Mandes, MD  ipratropium-albuterol (DUONEB) 0.5-2.5 (3) MG/3ML SOLN Take 3 mLs by nebulization every 6 (six) hours.   Yes Historical Provider, MD  polyethylene glycol (MIRALAX / GLYCOLAX) packet Take 17 g by mouth daily as needed for mild constipation. 05/09/16  Yes Fritzi Mandes, MD  ranitidine (ZANTAC) 150 MG tablet Take 150 mg by mouth 2 (two) times daily.   Yes Historical Provider, MD  sertraline (ZOLOFT) 50 MG tablet Take 50 mg by mouth daily.   Yes Historical Provider, MD  atorvastatin (LIPITOR) 40 MG tablet Take 1 tablet (40 mg total) by mouth at bedtime. Patient not taking: Reported on 07/16/2016 05/09/16   Fritzi Mandes, MD  Ipratropium-Albuterol (COMBIVENT) 20-100 MCG/ACT AERS respimat Inhale 1 puff into the lungs every 6 (six) hours as needed for wheezing. Patient not taking: Reported on 07/16/2016 05/09/16   Fritzi Mandes, MD  lidocaine-prilocaine (EMLA) cream Apply 1 application topically as needed. Patient not taking: Reported on 07/16/2016 06/02/16   Theodoro Grist, MD  tiotropium (SPIRIVA) 18 MCG inhalation capsule Place 1 capsule (18 mcg total) into inhaler and inhale daily. Patient not taking: Reported on 07/16/2016 05/09/16   Fritzi Mandes, MD      PHYSICAL EXAMINATION:   VITAL SIGNS: Blood pressure 124/74, pulse (!) 102, temperature 98.7 F (37.1 C), temperature source Axillary, resp. rate 20, height 5\' 5"  (1.651 m), weight 90.7 kg (200 lb), SpO2 91 %.  GENERAL:  76 y.o.-year-old patient lying in the bed In severe distress, on BiPAP, uncomfortable, however, unresponsive to verbal or pain similar.  EYES: Pupils equal, round, reactive to light and accommodation. No scleral icterus. Extraocular muscles intact.  HEENT: Head atraumatic, normocephalic. Oropharynx and nasopharynx clear.  NECK:  Supple, no jugular venous distention. No thyroid enlargement, no tenderness.  LUNGS: Reveal diminished breath  sounds bilaterally, almost absent breath sounds on the right, some air entrance of the left, few scattered wheezing, rales,rhonchi ,  crepitations. Using accessory muscles of respiration to breathe, struggling  CARDIOVASCULAR: S1, S2 normal. No murmurs, rubs, or gallops. Distant sounds  ABDOMEN: Soft, nontender, nondistended. Bowel sounds present. No organomegaly or mass.  EXTREMITIES: No pedal edema, cyanosis, or clubbing.  NEUROLOGIC: Cranial nerves cannot evaluate this patient is unresponsive. Muscle strength . Unable to assess. Sensation not able to assess. Gait not checked.  PSYCHIATRIC: The patient is comatoes unable to assess orientation, not responding to painful stimuli  SKIN: No obvious rash, lesion, or ulcer. Brownish discoloration of lower extremities  LABORATORY PANEL:   CBC  Recent Labs Lab 12/23/2016 1316  WBC 11.2*  HGB 16.7*  HCT 51.6*  PLT 367  MCV 97.6  MCH 31.6  MCHC 32.4  RDW 15.1*  LYMPHSABS 0.9*  MONOABS 0.9  EOSABS 0.0  BASOSABS 0.0   ------------------------------------------------------------------------------------------------------------------  Chemistries   Recent Labs Lab 12/10/2016 1316  NA 145  K 4.3  CL 98*  CO2 38*  GLUCOSE 158*  BUN 29*  CREATININE 0.89  CALCIUM 9.4  AST 19  ALT 15  ALKPHOS 70  BILITOT 0.8   ------------------------------------------------------------------------------------------------------------------  Cardiac Enzymes  Recent Labs Lab 12/22/2016 1316  TROPONINI 0.03*   ------------------------------------------------------------------------------------------------------------------  RADIOLOGY: Ct Angio Chest Pe W And/or Wo Contrast  Result Date: 12/15/2016 CLINICAL DATA:  Respiratory distress.  Hypoxia. EXAM: CT ANGIOGRAPHY CHEST WITH CONTRAST TECHNIQUE: Multidetector CT imaging of the chest was performed using the standard protocol during bolus administration of intravenous contrast. Multiplanar CT image  reconstructions and MIPs were obtained to evaluate the vascular anatomy. CONTRAST:  75 cc Isovue 370 COMPARISON:  Portable chest radiographs obtained earlier today and chest CT dated 02/22/2016. FINDINGS: Cardiovascular: Normally opacified pulmonary arteries with no pulmonary arterial filling defects seen. Atheromatous aortic calcification without aneurysm. Again demonstrated is mild coronary artery calcification. Mediastinum/Nodes: Interval mildly enlarged precarinal and right hilar lymph nodes. These include a precarinal node with a short axis diameter of 12 mm on image number 30 of series 4 in a right hilar node with a short axis diameter of 10 mm on image number 35 of series 4. Lungs/Pleura: There is more confluent opacity at the right lung apex with a less nodular appearance. Interval multiple small areas of patchy density in both upper lobes and in the right middle lobe and both lower lobes. There is also mild dependent atelectasis in the right lower lobe. No pleural fluid. Upper Abdomen: Cholecystectomy clips. Musculoskeletal: Thoracic spine degenerative changes. Review of the MIP images confirms the above findings. IMPRESSION: 1. No pulmonary emboli. 2. Multiple interval small, patchy irregular densities throughout both lungs, suspicious for atypical multifocal infection. Differential considerations include fungal and mycobacterial infections. 3. Mildly increased pleural and parenchymal scarring at the right lung apex. 4. Mild coronary artery atherosclerotic calcification. Electronically Signed   By: Claudie Revering M.D.   On: 12/28/2016 14:57   Dg Chest Port 1 View  Result Date: 12/09/2016 CLINICAL DATA:  Respiratory distress today. Decreased oxygen saturation. EXAM: PORTABLE CHEST 1 VIEW COMPARISON:  Single-view of the chest 07/16/2016. PA and lateral chest 05/31/2016. CT chest 02/22/2016. FINDINGS: The lungs are emphysematous. Right apical opacity correlating with lesion seen on prior CT scan is again  identified. No consolidative process, pneumothorax or effusion. Volume loss in the right chest is seen. Surgical clips right axilla are noted. Heart size is normal. IMPRESSION: No acute disease. Emphysema. Volume loss in the right chest may be related to treatment for right apical mass seen on prior CT. Electronically Signed   By: Inge Rise M.D.   On: 12/12/2016 13:44    EKG: Orders placed or performed during the hospital encounter of 12/15/2016  . EKG 12-Lead  . EKG 12-Lead  . EKG 12-Lead  . EKG 12-Lead    IMPRESSION AND PLAN:  Active Problems:   Acute on chronic respiratory failure with hypoxia and hypercapnia (HCC)   Acute metabolic encephalopathy   Multifocal pneumonia   Elevated troponin   Leukocytosis  #1. Acute on chronic respiratory failure with hypoxia and hypercapnia, now on BiPAP, not responding to painful similar, likely needs to be intubated, repeating ABGs, intensivist consultation is requested #2. COPD exacerbation due to multifocal pneumonia, initiate patient on steroids, nebulizing therapy #3. Multifocal pneumonia,  continue cefepime and vancomycin, get sputum cultures #4. Elevated troponin, likely demand ischemia, continue aspirin, Lovenox, follow cardiac enzymes 3, Get EKG, since it was not done in ED #5. Leukocytosis, follow with therapy   All the records are reviewed and case discussed with ED provider. Management plans discussed with the patient, family and they are in agreement.  CODE STATUS: Code Status History    Date Active Date Inactive Code Status Order ID Comments User Context   05/31/2016  7:41 PM 06/02/2016  9:19 PM Full Code [id number]  Lytle Butte, MD ED   05/07/2016  7:59 PM 05/09/2016  5:42 PM Full Code [id number]  Nicholes Mango, MD Inpatient   02/20/2016 12:57 AM 02/23/2016  7:10 PM Full Code [id number]  Alesia Richards, MD ED   12/12/2015 11:19 AM 12/14/2015  5:43 PM Partial Code [id number]  Hillary Bow, MD ED   11/08/2015 10:32 PM 11/09/2015  9:53 PM  Full Code [id number]  Lance Coon, MD Inpatient   11/04/2015 10:59 AM 11/08/2015 10:32 PM DNR [id number]  Laverle Hobby, MD Inpatient   11/02/2015  8:23 PM 11/04/2015 10:59 AM Full Code [id number]  Dustin Flock, MD Inpatient   01/17/2012  8:32 PM 01/28/2012  4:49 PM DNR [id number]  Mila Palmer, RN Inpatient       TOTALCritical care TIME TAKING CARE OF THIS PATIENT: 50 minutes.    Theodoro Grist M.D on 12/15/2016 at 3:29 PM  Between 7am to 6pm - Pager - [phone] After 6pm go to [email or web address] - password [email or web address] [facility]  [place] [facility] [place] [facility]  Office  [phone]  CC: Primary care physician; No PCP Per Patient

## 2016-12-19 NOTE — ED Notes (Signed)
150mg  ketamine given this time iv push

## 2016-12-19 NOTE — ED Notes (Signed)
Pt only responding to sternal rub to chest. Pt currently on bipap.  Unable to contact emergency contact listed in profile, said was wrong number.

## 2016-12-19 NOTE — ED Notes (Signed)
Called code sepsis to carelink [id number] per verbal notification from Dr. Joni Fears.

## 2016-12-19 NOTE — Consult Note (Signed)
PULMONARY / CRITICAL CARE MEDICINE   Name: Kaitlin Henderson MRN: [id number] DOB: 1941-03-28    ADMISSION DATE:  12/23/2016 CONSULTATION DATE:  12/01/2016  REFERRING MD:  Dr. Ether Griffins  CHIEF COMPLAINT: Code Sepsis and Respiratory Distress    HISTORY OF PRESENT ILLNESS:   This is a 76 yo female with a PMH of Obesity, NSTEMI, Hypothyroidism, Hyperlipidemia, GERD, Essential tremor, Type II DM, Depression, CAD, COPD-chronic home O2 3L, CHF, Breast cancer s/p chemo and right mastectomy 1998, Aphthous stomatitis, and Anginal pain.  She presented to [facility] ER 04/19 from skilled [place] via EMS with respiratory distress O2 sats 77% on 5L O2 via nasal canula.  Upon EMS arrival she was given 2 albuterol neb treatments with no improvement in respiratory status.  In the ER upon examination it was noted pt had wheezing and crackles in bilateral lungs and pt was pale and diaphoretic therefore she was placed on continuous Bipap.  An ABG was obtained and results were pH 7.23 and CO2 102.  Despite continuous Bipap the pt became increasingly unresponsive requiring mechanical intubation.  Sepsis protocol also initiated in the ER therefore she received a total of 3L NS bolus. She was subsequently admitted to ICU with acute on chronic hypercapnic hypoxic respiratory failure secondary to AECOPD and ?HCAP and acute encephalopathy secondary to hypercapnia.  PAST MEDICAL HISTORY :  She  has a past medical history of Anginal pain ([place]); Aphthous stomatitis; APHTHOUS STOMATITIS; Breast cancer, right breast ([place]) (1998); Breast cancer, right breast ([place]) (1998); C O P D; CHF (congestive heart failure) ([place]); COPD (chronic obstructive pulmonary disease) ([place]); Coronary artery disease; CORONARY ARTERY DISEASE; Depression; DEPRESSION; Diabetes mellitus, type 2 ([place]) (03/2010); DIABETES, TYPE 2 (dx 03/2010); Essential tremor; GERD (gastroesophageal reflux disease); Hyperlipemia; HYPERLIPIDEMIA; Hypothyroidism; NSTEMI  (non-ST elevated myocardial infarction) ([place]) (01/2012); NSTEMI (non-ST elevated myocardial infarction) ([place]) (01/2012); Obesity; and OBESITY.  PAST SURGICAL HISTORY: She  has a past surgical history that includes right mastectomy; Appendectomy; Cholecystectomy; Abdominal hysterectomy; Right Mastectomy; Tonsillectomy; and Mastectomy.  Allergies  Allergen Reactions  . Codeine Other (See Comments)    Pt states that this medication makes her pass out.   . Levofloxacin Other (See Comments)    Reaction:  Redness   . Sulfa Antibiotics Swelling    No current facility-administered medications on file prior to encounter.    Current Outpatient Prescriptions on File Prior to Encounter  Medication Sig  . aspirin 325 MG tablet Take 325 mg by mouth 2 (two) times daily.   . insulin aspart (NOVOLOG) 100 UNIT/ML injection Inject 0-20 Units into the skin 3 (three) times daily with meals.  . insulin detemir (LEVEMIR) 100 UNIT/ML injection Inject 0.25 mLs (25 Units total) into the skin at bedtime. (Patient taking differently: Inject 28 Units into the skin at bedtime. )  . polyethylene glycol (MIRALAX / GLYCOLAX) packet Take 17 g by mouth daily as needed for mild constipation.  [name] atorvastatin (LIPITOR) 40 MG tablet Take 1 tablet (40 mg total) by mouth at bedtime. (Patient not taking: Reported on 07/16/2016)  . Ipratropium-Albuterol (COMBIVENT) 20-100 MCG/ACT AERS respimat Inhale 1 puff into the lungs every 6 (six) hours as needed for wheezing. (Patient not taking: Reported on 07/16/2016)  . lidocaine-prilocaine (EMLA) cream Apply 1 application topically as needed. (Patient not taking: Reported on 07/16/2016)  . tiotropium (SPIRIVA) 18 MCG inhalation capsule Place 1 capsule (18 mcg total) into inhaler and inhale daily. (Patient not taking: Reported on 07/16/2016)    FAMILY HISTORY:  Her has no family status information on file.    SOCIAL HISTORY: She  reports that she quit smoking about 19 years ago. Her  smoking use included Cigarettes. She has a 135.00 pack-year smoking history. She has never used smokeless tobacco. She reports that she does not drink alcohol or use drugs.  REVIEW OF SYSTEMS:   Unable to assess pt intubated  SUBJECTIVE:  Unable to assess pt intubated   VITAL SIGNS: BP (!) 144/92   Pulse 94   Temp 98.7 F (37.1 C) (Axillary)   Resp (!) 21   Ht 5\' 5"  (1.651 m)   Wt 90.7 kg (200 lb)   SpO2 97%   BMI 33.28 kg/m   HEMODYNAMICS:    VENTILATOR SETTINGS:    INTAKE / OUTPUT: No intake/output data recorded.  PHYSICAL EXAMINATION: General:  Elderly sickly appearing female, intubated and on mechanical ventilation Neuro:  Alert and oriented, follows command HEENT:  AT,[place],No JVD, PERRLA Cardiovascular:  S1S2, Regular, no m/r/g  Lungs:  Coarse bilaterally, no wheezes, crackles, rhonchi noted Abdomen:  Soft,non tender,active bowel sounds Musculoskeletal:  No edema/cyanosis noted Skin:  Warm,dry and intact.  LABS:  BMET  Recent Labs Lab 12/05/2016 1316  NA 145  K 4.3  CL 98*  CO2 38*  BUN 29*  CREATININE 0.89  GLUCOSE 158*    Electrolytes  Recent Labs Lab 12/01/2016 1316  CALCIUM 9.4    CBC  Recent Labs Lab 12/27/2016 1316  WBC 11.2*  HGB 16.7*  HCT 51.6*  PLT 367    Coag's  Recent Labs Lab 12/14/2016 1316  APTT 29  INR 1.17    Sepsis Markers  Recent Labs Lab 12/10/2016 1316  LATICACIDVEN 1.1  PROCALCITON 0.23    ABG  Recent Labs Lab 12/30/2016 1317 12/12/2016 1529  PHART 7.23* PENDING  PCO2ART 102* 99*  PO2ART 259* PENDING    Liver Enzymes  Recent Labs Lab 12/13/2016 1316  AST 19  ALT 15  ALKPHOS 70  BILITOT 0.8  ALBUMIN 3.2*    Cardiac Enzymes  Recent Labs Lab 12/09/2016 1316  TROPONINI 0.03*    Glucose No results for input(s): GLUCAP in the last 168 hours.  Imaging Ct Angio Chest Pe W And/or Wo Contrast  Result Date: 12/06/2016 CLINICAL DATA:  Respiratory distress.  Hypoxia. EXAM: CT ANGIOGRAPHY CHEST  WITH CONTRAST TECHNIQUE: Multidetector CT imaging of the chest was performed using the standard protocol during bolus administration of intravenous contrast. Multiplanar CT image reconstructions and MIPs were obtained to evaluate the vascular anatomy. CONTRAST:  75 cc Isovue 370 COMPARISON:  Portable chest radiographs obtained earlier today and chest CT dated 02/22/2016. FINDINGS: Cardiovascular: Normally opacified pulmonary arteries with no pulmonary arterial filling defects seen. Atheromatous aortic calcification without aneurysm. Again demonstrated is mild coronary artery calcification. Mediastinum/Nodes: Interval mildly enlarged precarinal and right hilar lymph nodes. These include a precarinal node with a short axis diameter of 12 mm on image number 30 of series 4 in a right hilar node with a short axis diameter of 10 mm on image number 35 of series 4. Lungs/Pleura: There is more confluent opacity at the right lung apex with a less nodular appearance. Interval multiple small areas of patchy density in both upper lobes and in the right middle lobe and both lower lobes. There is also mild dependent atelectasis in the right lower lobe. No pleural fluid. Upper Abdomen: Cholecystectomy clips. Musculoskeletal: Thoracic spine degenerative changes. Review of the MIP images confirms the above findings. IMPRESSION: 1. No pulmonary  emboli. 2. Multiple interval small, patchy irregular densities throughout both lungs, suspicious for atypical multifocal infection. Differential considerations include fungal and mycobacterial infections. 3. Mildly increased pleural and parenchymal scarring at the right lung apex. 4. Mild coronary artery atherosclerotic calcification. Electronically Signed   By: Claudie Revering M.D.   On: 12/20/2016 14:57   Dg Chest Portable 1 View  Result Date:  CLINICAL DATA:  Status post intubation EXAM: PORTABLE CHEST 1 VIEW COMPARISON:  Pre intubation chest x-ray of today's date FINDINGS: The  endotracheal tube tip lies approximately 1.2 cm above the carina. The lungs are well-expanded. The interstitial markings remain increased. The cardiac silhouette is enlarged. The pulmonary vascularity is prominent. There is calcification in the wall of the aortic arch. External pacemaker defibrillator pads are present. The bony structures exhibit no acute abnormality. IMPRESSION: The endotracheal tube tip projects approximately 1.2 cm above the carina. Withdrawal by 3 cm is recommended to avoid accidental mainstem bronchus intubation with patient movement. These results were called by me to Dr. Joni Fears in the emergency department at 4:24 p.m. on 19 December 2016. Electronically Signed   By: Tangi Shroff  [place] M.D.   On: 12/03/2016 16:25   Dg Chest Portable 1 View  Result Date: 12/04/2016 CLINICAL DATA:  Respiratory distress. EXAM: PORTABLE CHEST 1 VIEW COMPARISON:  Earlier chest x-ray same date and earlier chest CT same date. FINDINGS: The cardiac silhouette, mediastinal and hilar contours are stable. Chronic lung changes but no confluent airspace process or pulmonary edema. No definite pleural effusions. IMPRESSION: Chronic lung changes without focal airspace process or pleural effusion. Electronically Signed   By: Marijo Sanes M.D.   On: 12/21/2016 15:42   Dg Chest Port 1 View  Result Date: 12/13/2016 CLINICAL DATA:  Respiratory distress today. Decreased oxygen saturation. EXAM: PORTABLE CHEST 1 VIEW COMPARISON:  Single-view of the chest 07/16/2016. PA and lateral chest 05/31/2016. CT chest 02/22/2016. FINDINGS: The lungs are emphysematous. Right apical opacity correlating with lesion seen on prior CT scan is again identified. No consolidative process, pneumothorax or effusion. Volume loss in the right chest is seen. Surgical clips right axilla are noted. Heart size is normal. IMPRESSION: No acute disease. Emphysema. Volume loss in the right chest may be related to treatment for right apical mass seen on prior  CT. Electronically Signed   By: Inge Rise M.D.   On: 12/20/2016 13:44   STUDIES:  CT Angio Chest 04/19>>No pulmonary emboli. Multiple interval small, patchy irregular densities throughout both lungs, suspicious for atypical multifocal infection. Differential considerations include fungal and mycobacterial infections. Mildly increased pleural and parenchymal scarring at the right lung apex. Mild coronary artery atherosclerotic calcification.  CULTURES: Urine 04/19>> Blood x2 04/19>>  Sputum culture >>  ANTIBIOTICS: Cefepime 04/19>> Vancomycin 04/19>>  SIGNIFICANT EVENTS: 04/19-Pt requiring mechanical intubation due to acute on chronic hypercapnic hypoxic respiratory failure and acute encephalopathy, therefore admitted to ICU  LINES/TUBES: ETT 04/19>>  ASSESSMENT / PLAN:  PULMONARY A: Acute on chronic hypoxic hypercapnic respiratory failure secondary to AECOPD  Atypical multifocal infection Hx: Chronic O2 , uses 3L at baseline  Hx.Obesity hypoventilation syndrome P:   Vent settings established VAP bundle implemented Continue Bronchodilator therapy Continue Cefipime/vancomycin Continue Steroids,taper CARDIOVASCULAR A:  History  of CAD/CHF/AICD Mildly elevated troponin possibly related to demand ischemia P:  Continuous Telemetry Trend troponin Continue Aspirin  RENAL A:   No active issues P:   Monitor I/O  Replace electrolytes per usual guidelines  GASTROINTESTINAL A:   Hx of GERD P:  Pepcid for GIP  HEMATOLOGIC A:   No active issues P:  lovenox for DVT prophylaxis Transfuse per usual guidelines  INFECTIOUS A:   ?Atypical  Multifocal Pneumonia Leukocytosis  P:   Continue antibiotics as above  Follow cultures Monitor fever ,CBC Lactic acid-1.2, PCT 0.23 ENDOCRINE A:   DM type-2 P:   POCT with ssi coverage  NEUROLOGIC A:   ET tube associated discomfort P:   RASS goal: 0 to -1 Fentanyl/versed PRN Provide supportive  care.   FAMILY  - Updates: No family present at the bedside     Bincy Varughese,AG-ACNP [facility] and [place]   12/04/2016   Merton Border, MD PCCM service Mobile [phone] Pager [phone] 12/20/2016

## 2016-12-19 NOTE — ED Notes (Signed)
Pt transported to ct on the bipap and cardiac monitor and back to er room 17. Admitting in to see pt. Pt only responsive to chest rub. Unable to contact any family. Did call [facility] and pt is a FULL CODE at this time.

## 2016-12-19 NOTE — ED Notes (Signed)
90mg  of ROC given now iv push.

## 2016-12-19 NOTE — ED Notes (Signed)
Pt will be transported to ICU 13 via stretcher with two staff RN's and respiratory tech.

## 2016-12-19 NOTE — ED Provider Notes (Signed)
[facility] Emergency Department Provider Note  ____________________________________________  Time seen: Approximately 1:04 PM  I have reviewed the triage vital signs and the nursing notes.   HISTORY  Chief Complaint Code Sepsis and Respiratory Distress Level 5 caveat:  Portions of the history and physical were unable to be obtained due to the patient's acute illness    HPI Kaitlin Henderson is a 76 y.o. female brought to the ED from nursing home due to respiratory distress.She is reported to be under hospice care for COPD, on 3 L nasal cannula at home. Full code, full scope of care. Also has a history of breast cancer status post right mastectomy in 1998. EMS report that patient had a oxygen saturation of about 70% on their arrival. On 5 L nasal cannula here, oxygen saturation remains only 77%. Patient denies chest pain or other pain. Shortness of breath but no cough or fever.  Patient received 2 albuterol neb treatments by EMS prior to arrival in the ED.   Past Medical History:  Diagnosis Date  . Anginal pain ([place])   . Aphthous stomatitis   . APHTHOUS STOMATITIS   . Breast cancer, right breast ([place]) 1998   s/p chemo & XRT; right mastectomy  . Breast cancer, right breast ([place]) 1998   s/p chemo & XRT, right mastectomy  . C O P D    chronic O2 3LPM   . CHF (congestive heart failure) ([place])   . COPD (chronic obstructive pulmonary disease) ([place])   . Coronary artery disease   . CORONARY ARTERY DISEASE   . Depression   . DEPRESSION    started sertraline 09/2010  . Diabetes mellitus, type 2 ([place]) 03/2010  . DIABETES, TYPE 2 dx 03/2010  . Essential tremor   . GERD (gastroesophageal reflux disease)   . Hyperlipemia   . HYPERLIPIDEMIA   . Hypothyroidism   . NSTEMI (non-ST elevated myocardial infarction) ([place]) 01/2012  . NSTEMI (non-ST elevated myocardial infarction) ([place]) 01/2012   med mgmt  . Obesity   . OBESITY      Patient Active Problem List    Diagnosis Date Noted  . Acute metabolic encephalopathy 22/10/5425  . Multifocal pneumonia 12/29/2016  . Elevated troponin 12/21/2016  . Leukocytosis 12/05/2016  . Generalized weakness 06/02/2016  . Lower back pain 06/02/2016  . Hip pain 06/02/2016  . Diarrhea 06/02/2016  . Diabetes ([place]) 06/02/2016  . Lung nodule 11/25/2015  . Anxiety 11/25/2015  . GERD (gastroesophageal reflux disease) 11/25/2015  . Cellulitis 11/16/2015  . 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 Acute on chronic respiratory failure with hypoxia and hypercapnia (HCC)   . Non-compliant behavior 12/05/2014  . Decreased mobility 08/15/2014  . Benign essential tremor 09/05/2011  . Breast cancer, right breast ([place])   . DEPRESSION 09/25/2010  . HAIR LOSS 06/26/2010  . 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 POLYURIA 03/13/2010  . APHTHOUS STOMATITIS 08/23/2009  . CHRONIC RESPIRATORY FAILURE 08/18/2007  . Hyperlipidemia 08/17/2007  . OBESITY 08/17/2007  . CORONARY ARTERY DISEASE 08/17/2007  . COPD with emphysema, [place]  08/17/2007     Past Surgical History:  Procedure Laterality Date  . ABDOMINAL HYSTERECTOMY    . APPENDECTOMY    . CHOLECYSTECTOMY    . MASTECTOMY    . right mastectomy    . Right Mastectomy    . TONSILLECTOMY       Prior to Admission medications   Medication Sig Start Date End Date  Taking? Authorizing Provider  ALPRAZolam (XANAX) 0.25 MG tablet Take 0.25 mg by mouth 2 (two) times daily.   Yes Historical Provider, MD  aspirin 325 MG tablet Take 325 mg by mouth 2 (two) times daily.    Yes Historical Provider, MD  budesonide (PULMICORT) 0.5 MG/2ML nebulizer solution Take 0.5 mg by nebulization 2 (two) times daily.   Yes Historical Provider, MD  clonazePAM (KLONOPIN) 0.5 MG tablet Take 0.5 mg by mouth at bedtime.   Yes Historical Provider, MD  insulin aspart (NOVOLOG) 100 UNIT/ML injection Inject 0-20 Units into the skin 3 (three) times daily with  meals. 05/09/16  Yes Fritzi Mandes, MD  insulin detemir (LEVEMIR) 100 UNIT/ML injection Inject 0.25 mLs (25 Units total) into the skin at bedtime. Patient taking differently: Inject 28 Units into the skin at bedtime.  05/09/16  Yes Fritzi Mandes, MD  ipratropium-albuterol (DUONEB) 0.5-2.5 (3) MG/3ML SOLN Take 3 mLs by nebulization every 6 (six) hours.   Yes Historical Provider, MD  polyethylene glycol (MIRALAX / GLYCOLAX) packet Take 17 g by mouth daily as needed for mild constipation. 05/09/16  Yes Fritzi Mandes, MD  ranitidine (ZANTAC) 150 MG tablet Take 150 mg by mouth 2 (two) times daily.   Yes Historical Provider, MD  sertraline (ZOLOFT) 50 MG tablet Take 50 mg by mouth daily.   Yes Historical Provider, MD  atorvastatin (LIPITOR) 40 MG tablet Take 1 tablet (40 mg total) by mouth at bedtime. Patient not taking: Reported on 07/16/2016 05/09/16   Fritzi Mandes, MD  Ipratropium-Albuterol (COMBIVENT) 20-100 MCG/ACT AERS respimat Inhale 1 puff into the lungs every 6 (six) hours as needed for wheezing. Patient not taking: Reported on 07/16/2016 05/09/16   Fritzi Mandes, MD  lidocaine-prilocaine (EMLA) cream Apply 1 application topically as needed. Patient not taking: Reported on 07/16/2016 06/02/16   Theodoro Grist, MD  tiotropium (SPIRIVA) 18 MCG inhalation capsule Place 1 capsule (18 mcg total) into inhaler and inhale daily. Patient not taking: Reported on 07/16/2016 05/09/16   Fritzi Mandes, MD     Allergies Codeine; Levofloxacin; and Sulfa antibiotics   Family History  Problem Relation Age of Onset  . Diabetes Neg Hx   . Cancer Neg Hx     Social History Social History  Substance Use Topics  . Smoking status: Former Smoker    Packs/day: 2.50    Years: 54.00    Types: Cigarettes    Quit date: 09/02/1997  . Smokeless tobacco: Never Used     Comment: Retired. Widow/widower since 2008-lives alone. Home hospice since 06/2009 related to COPD  . Alcohol use No    Review of Systems  Constitutional:   No fever or  chills.  ENT:   No sore throat.  Cardiovascular:   No chest pain. Respiratory:   Positive shortness of breath without cough. Gastrointestinal:   Negative for abdominal pain, vomiting and diarrhea.   Musculoskeletal:   Negative for focal pain or swelling  10-point ROS otherwise negative.  ____________________________________________   PHYSICAL EXAM:  VITAL SIGNS: ED Triage Vitals [12/23/2016 1303]  Enc Vitals Group     BP 129/79     Pulse Rate (!) 116     Resp (!) 31     Temp 98.7 F (37.1 C)     Temp Source Axillary     SpO2 (!) 77 %     Weight      Height      Head Circumference      Peak Flow  Pain Score      Pain Loc      Pain Edu?      Excl. in [place]?     Vital signs reviewed, nursing assessments reviewed.   Constitutional:   Alert and oriented. Ill-appearing, respiratory distress Eyes:   No scleral icterus. No conjunctival pallor. PERRL. EOMI.  No nystagmus. ENT   Head:   Normocephalic and atraumatic.   Nose:   No congestion/rhinnorhea. No septal hematoma   Mouth/Throat:   Dry mucous membranes, no pharyngeal erythema. No peritonsillar mass.    Neck:   No stridor. No SubQ emphysema. No meningismus. Hematological/Lymphatic/Immunilogical:   No cervical lymphadenopathy. Cardiovascular:   Tachycardia heart rate 1:15 to 120, occasional PVCs on the monitor.. Symmetric bilateral radial and DP pulses.  No murmurs.  Respiratory:   Increased work of breathing with retractions. Tachypnea, respiratory rate about 40. Basilar crackles bilaterally, expiratory wheezing. Good air entry.. Gastrointestinal:   Soft and nontender. Non distended. There is no CVA tenderness.  No rebound, rigidity, or guarding. Genitourinary:   deferred Musculoskeletal:   Normal range of motion in all extremities. No joint effusions.  No lower extremity tenderness.  No edema. Neurologic:   Normal speech and language.  CN 2-10 normal. Motor grossly intact. No gross focal neurologic  deficits are appreciated.  Skin:    Skin is warm, diaphoretic and intact. No rash noted.  No petechiae, purpura, or bullae. Normal capillary refill. Normal skin turgor.  ____________________________________________    LABS (pertinent positives/negatives) (all labs ordered are listed, but only abnormal results are displayed) Labs Reviewed  COMPREHENSIVE METABOLIC PANEL - Abnormal; Notable for the following:       Result Value   Chloride 98 (*)    CO2 38 (*)    Glucose, Bld 158 (*)    BUN 29 (*)    Total Protein 8.3 (*)    Albumin 3.2 (*)    All other components within normal limits  TROPONIN I - Abnormal; Notable for the following:    Troponin I 0.03 (*)    All other components within normal limits  CBC WITH DIFFERENTIAL/PLATELET - Abnormal; Notable for the following:    WBC 11.2 (*)    RBC 5.29 (*)    Hemoglobin 16.7 (*)    HCT 51.6 (*)    RDW 15.1 (*)    Neutro Abs 9.3 (*)    Lymphs Abs 0.9 (*)    All other components within normal limits  BLOOD GAS, ARTERIAL - Abnormal; Notable for the following:    pH, Arterial 7.23 (*)    pCO2 arterial 102 (*)    pO2, Arterial 259 (*)    Bicarbonate 42.7 (*)    Acid-Base Excess 10.8 (*)    All other components within normal limits  URINALYSIS, COMPLETE (UACMP) WITH MICROSCOPIC - Abnormal; Notable for the following:    Color, Urine YELLOW (*)    APPearance HAZY (*)    Specific Gravity, Urine >1.046 (*)    Ketones, ur 20 (*)    Protein, ur 30 (*)    Squamous Epithelial / LPF 6-30 (*)    All other components within normal limits  BLOOD GAS, ARTERIAL - Abnormal; Notable for the following:    pCO2 arterial 99 (*)    Bicarbonate 35.3 (*)    Acid-Base Excess 3.0 (*)    All other components within normal limits  CULTURE, BLOOD (ROUTINE X 2)  CULTURE, BLOOD (ROUTINE X 2)  URINE CULTURE  LACTIC ACID, PLASMA  LIPASE,  BLOOD  APTT  PROTIME-INR  PROCALCITONIN  LACTIC ACID, PLASMA  TROPONIN I  TROPONIN I  TROPONIN I    ____________________________________________   EKG  Interpreted by me Sinus tach 110. Left axis, nl intervals. Nl ST segments and T waves. 4 PVCs on the strip.   ____________________________________________    RADIOLOGY  Ct Angio Chest Pe W And/or Wo Contrast  Result Date: 12/12/2016 CLINICAL DATA:  Respiratory distress.  Hypoxia. EXAM: CT ANGIOGRAPHY CHEST WITH CONTRAST TECHNIQUE: Multidetector CT imaging of the chest was performed using the standard protocol during bolus administration of intravenous contrast. Multiplanar CT image reconstructions and MIPs were obtained to evaluate the vascular anatomy. CONTRAST:  75 cc Isovue 370 COMPARISON:  Portable chest radiographs obtained earlier today and chest CT dated 02/22/2016. FINDINGS: Cardiovascular: Normally opacified pulmonary arteries with no pulmonary arterial filling defects seen. Atheromatous aortic calcification without aneurysm. Again demonstrated is mild coronary artery calcification. Mediastinum/Nodes: Interval mildly enlarged precarinal and right hilar lymph nodes. These include a precarinal node with a short axis diameter of 12 mm on image number 30 of series 4 in a right hilar node with a short axis diameter of 10 mm on image number 35 of series 4. Lungs/Pleura: There is more confluent opacity at the right lung apex with a less nodular appearance. Interval multiple small areas of patchy density in both upper lobes and in the right middle lobe and both lower lobes. There is also mild dependent atelectasis in the right lower lobe. No pleural fluid. Upper Abdomen: Cholecystectomy clips. Musculoskeletal: Thoracic spine degenerative changes. Review of the MIP images confirms the above findings. IMPRESSION: 1. No pulmonary emboli. 2. Multiple interval small, patchy irregular densities throughout both lungs, suspicious for atypical multifocal infection. Differential considerations include fungal and mycobacterial infections. 3. Mildly  increased pleural and parenchymal scarring at the right lung apex. 4. Mild coronary artery atherosclerotic calcification. Electronically Signed   By: Claudie Revering M.D.   On: 12/04/2016 14:57   Dg Chest Portable 1 View  Result Date: 12/29/2016 CLINICAL DATA:  Respiratory distress. EXAM: PORTABLE CHEST 1 VIEW COMPARISON:  Earlier chest x-ray same date and earlier chest CT same date. FINDINGS: The cardiac silhouette, mediastinal and hilar contours are stable. Chronic lung changes but no confluent airspace process or pulmonary edema. No definite pleural effusions. IMPRESSION: Chronic lung changes without focal airspace process or pleural effusion. Electronically Signed   By: Marijo Sanes M.D.   On: 12/06/2016 15:42   Dg Chest Port 1 View  Result Date: 12/18/2016 CLINICAL DATA:  Respiratory distress today. Decreased oxygen saturation. EXAM: PORTABLE CHEST 1 VIEW COMPARISON:  Single-view of the chest 07/16/2016. PA and lateral chest 05/31/2016. CT chest 02/22/2016. FINDINGS: The lungs are emphysematous. Right apical opacity correlating with lesion seen on prior CT scan is again identified. No consolidative process, pneumothorax or effusion. Volume loss in the right chest is seen. Surgical clips right axilla are noted. Heart size is normal. IMPRESSION: No acute disease. Emphysema. Volume loss in the right chest may be related to treatment for right apical mass seen on prior CT. Electronically Signed   By: Inge Rise M.D.   On: 12/12/2016 13:44    ____________________________________________   PROCEDURES Procedures CRITICAL CARE Performed by: Joni Fears, Alexio Sroka   Total critical care time: 35 minutes  Critical care time was exclusive of separately billable procedures and treating other patients.  Critical care was necessary to treat or prevent imminent or life-threatening deterioration.  Critical care was time spent personally by me  on the following activities: development of treatment plan  with patient and/or surrogate as well as nursing, discussions with consultants, evaluation of patient's response to treatment, examination of patient, obtaining history from patient or surrogate, ordering and performing treatments and interventions, ordering and review of laboratory studies, ordering and review of radiographic studies, pulse oximetry and re-evaluation of patient's condition.   INTUBATION Performed by: Joni Fears, Gibran Veselka  Required items: required blood products, implants, devices, and special equipment available Patient identity confirmed: provided demographic data and hospital-assigned identification number Time out: Immediately prior to procedure a "time out" was called to verify the correct patient, procedure, equipment, support staff and site/side marked as required.  Indications: altered mental status, respiratory failure  Intubation method: Glidescope Laryngoscopy   Preoxygenation: BVM  Sedatives: ketamine Paralytic: rocuronium  Tube Size: 7.5 cuffed  Post-procedure assessment: chest rise and ETCO2 monitor Breath sounds: equal and absent over the epigastrium Tube secured with: ETT holder Chest x-ray interpreted by radiologist and me.  Chest x-ray findings: endotracheal tube in appropriate position  Patient tolerated the procedure well with no immediate complications.   ____________________________________________   INITIAL IMPRESSION / ASSESSMENT AND PLAN / ED COURSE  Pertinent labs & imaging results that were available during my care of the patient were reviewed by me and considered in my medical decision making (see chart for details).  Patient presents with respiratory distress, tachypnea hypoxia tachycardia. Sepsis from pneumonia versus COPD exacerbation versus PE. Code sepsis initiated, follow-up initial workup to determine further testing. We'll start BiPAP for respiratory support, check an ABG. Patient will need to be admitted to the  ICU.     Clinical Course as of Dec 19 1609  Thu Dec 19, 2016  1531 Repeat CXR = no ptx. Getting repeat abg  [PS]  1542 Pco2 unimproved. Will intubate. Full code status confirmed with nursing home.   [PS]  1605 Intubation complete, uncomplicated. Repeat cxr, ready for transporrt to icu  [PS]    Clinical Course User Index [PS] Carrie Mew, MD     ----------------------------------------- 4:10 PM on 12/07/2016 -----------------------------------------  On reassessment patient had declining mental status, ABG unimproved, no pneumothorax on chest x-ray. On the BiPAP, tidal volumes were only about 350. With persistent hypoventilation and declining mental status, impending respiratory failure and arrest for anticipated so the patient was intubated. No hypoxia during the intubation or other adverse events. First pass success with intubation. .  ____________________________________________   FINAL CLINICAL IMPRESSION(S) / ED DIAGNOSES  Final diagnoses:  COPD exacerbation ([place])  Multifocal pneumonia  Sepsis, due to unspecified organism ([place])  Acute respiratory failure with hypoxia and hypercapnia ([place])      New Prescriptions   No medications on file     Portions of this note were generated with dragon dictation software. Dictation errors may occur despite best attempts at proofreading.    Carrie Mew, MD 12/02/2016 [phone]

## 2016-12-20 ENCOUNTER — Inpatient Hospital Stay

## 2016-12-20 DIAGNOSIS — J9621 Acute and chronic respiratory failure with hypoxia: Secondary | ICD-10-CM

## 2016-12-20 DIAGNOSIS — Z9911 Dependence on respirator [ventilator] status: Secondary | ICD-10-CM

## 2016-12-20 DIAGNOSIS — G9341 Metabolic encephalopathy: Secondary | ICD-10-CM

## 2016-12-20 DIAGNOSIS — R34 Anuria and oliguria: Secondary | ICD-10-CM

## 2016-12-20 DIAGNOSIS — J9622 Acute and chronic respiratory failure with hypercapnia: Secondary | ICD-10-CM

## 2016-12-20 DIAGNOSIS — R918 Other nonspecific abnormal finding of lung field: Secondary | ICD-10-CM

## 2016-12-20 LAB — BASIC METABOLIC PANEL
Anion gap: 8 (ref 5–15)
BUN: 21 mg/dL — AB (ref 6–20)
CHLORIDE: 106 mmol/L (ref 101–111)
CO2: 30 mmol/L (ref 22–32)
Calcium: 8 mg/dL — ABNORMAL LOW (ref 8.9–10.3)
Creatinine, Ser: 0.67 mg/dL (ref 0.44–1.00)
GFR calc Af Amer: >60 mL/min (ref >60)
GFR calc non Af Amer: >60 mL/min (ref >60)
GLUCOSE: 231 mg/dL — AB (ref 65–99)
POTASSIUM: 3.7 mmol/L (ref 3.5–5.1)
Sodium: 144 mmol/L (ref 135–145)

## 2016-12-20 LAB — GLUCOSE, CAPILLARY
GLUCOSE-CAPILLARY: 255 mg/dL — AB (ref 65–99)
Glucose-Capillary: 230 mg/dL — ABNORMAL HIGH (ref 65–99)
Glucose-Capillary: 207 mg/dL — ABNORMAL HIGH (ref 65–99)
Glucose-Capillary: 150 mg/dL — ABNORMAL HIGH (ref 65–99)
Glucose-Capillary: 205 mg/dL — ABNORMAL HIGH (ref 65–99)
Glucose-Capillary: 256 mg/dL — ABNORMAL HIGH (ref 65–99)

## 2016-12-20 LAB — BLOOD GAS, ARTERIAL
Acid-Base Excess: 3 mmol/L — ABNORMAL HIGH (ref 0.0–2.0)
Bicarbonate: 35.3 mmol/L — ABNORMAL HIGH (ref 20.0–28.0)
Delivery systems: POSITIVE
EXPIRATORY PAP: 8
FIO2: 0.7
INSPIRATORY PAP: 16
O2 Saturation: 96.2 %
Patient temperature: 37
pCO2 arterial: 99 mmHg (ref 32.0–48.0)
pH, Arterial: 7.16 — CL (ref 7.350–7.450)
pO2, Arterial: 104 mmHg (ref 83.0–108.0)

## 2016-12-20 LAB — URINE CULTURE: CULTURE: NO GROWTH

## 2016-12-20 LAB — CBC
HEMATOCRIT: 42.9 % (ref 35.0–47.0)
HEMOGLOBIN: 14 g/dL (ref 12.0–16.0)
MCH: 31.4 pg (ref 26.0–34.0)
MCHC: 32.7 g/dL (ref 32.0–36.0)
MCV: 95.9 fL (ref 80.0–100.0)
Platelets: 282 10*3/uL (ref 150–440)
RBC: 4.47 MIL/uL (ref 3.80–5.20)
RDW: 15.2 % — AB (ref 11.5–14.5)
WBC: 9.6 10*3/uL (ref 3.6–11.0)

## 2016-12-20 LAB — TROPONIN I
Troponin I: 0.03 ng/mL (ref <0.03)
Troponin I: 0.03 ng/mL (ref <0.03)

## 2016-12-20 MED ORDER — BUDESONIDE 0.5 MG/2ML IN SUSP
0.2500 mg | Freq: Four times a day (QID) | RESPIRATORY_TRACT | Status: DC
  Administered 2016-12-20 – 2016-12-24 (×18): 0.25 mg via RESPIRATORY_TRACT
  Filled 2016-12-20 (×15): qty 2

## 2016-12-20 MED ORDER — INSULIN GLARGINE 100 UNIT/ML ~~LOC~~ SOLN
10.0000 [IU] | Freq: Every day | SUBCUTANEOUS | Status: DC
  Administered 2016-12-20: 10 [IU] via SUBCUTANEOUS
  Filled 2016-12-20 (×2): qty 0.1

## 2016-12-20 MED ORDER — INSULIN ASPART 100 UNIT/ML ~~LOC~~ SOLN
2.0000 [IU] | SUBCUTANEOUS | Status: DC
  Administered 2016-12-20 – 2016-12-21 (×5): 2 [IU] via SUBCUTANEOUS
  Filled 2016-12-20 (×5): qty 2

## 2016-12-20 MED ORDER — DEXMEDETOMIDINE HCL IN NACL 400 MCG/100ML IV SOLN
0.4000 ug/kg/h | INTRAVENOUS | Status: DC
  Administered 2016-12-20: 0.6 ug/kg/h via INTRAVENOUS
  Administered 2016-12-20: 0.4 ug/kg/h via INTRAVENOUS
  Administered 2016-12-21 – 2016-12-23 (×13): 1.2 ug/kg/h via INTRAVENOUS
  Administered 2016-12-23: 1 ug/kg/h via INTRAVENOUS
  Administered 2016-12-23 – 2016-12-24 (×3): 1.2 ug/kg/h via INTRAVENOUS
  Filled 2016-12-20 (×20): qty 100

## 2016-12-20 MED ORDER — IPRATROPIUM-ALBUTEROL 0.5-2.5 (3) MG/3ML IN SOLN
3.0000 mL | Freq: Four times a day (QID) | RESPIRATORY_TRACT | Status: DC
  Administered 2016-12-20 – 2016-12-24 (×18): 3 mL via RESPIRATORY_TRACT
  Filled 2016-12-20 (×17): qty 3

## 2016-12-20 MED ORDER — PRO-STAT SUGAR FREE PO LIQD: 30.0000 mL | Freq: Two times a day (BID) | ORAL | Status: DC

## 2016-12-20 MED ORDER — INSULIN ASPART 100 UNIT/ML ~~LOC~~ SOLN
0.0000 [IU] | SUBCUTANEOUS | Status: DC
  Administered 2016-12-20: 2 [IU] via SUBCUTANEOUS
  Administered 2016-12-20: 8 [IU] via SUBCUTANEOUS
  Administered 2016-12-20: 5 [IU] via SUBCUTANEOUS
  Administered 2016-12-21 (×2): 3 [IU] via SUBCUTANEOUS
  Administered 2016-12-21 (×3): 8 [IU] via SUBCUTANEOUS
  Administered 2016-12-21 – 2016-12-22 (×2): 3 [IU] via SUBCUTANEOUS
  Administered 2016-12-22 (×2): 2 [IU] via SUBCUTANEOUS
  Administered 2016-12-22 (×3): 3 [IU] via SUBCUTANEOUS
  Administered 2016-12-22: 2 [IU] via SUBCUTANEOUS
  Administered 2016-12-23 – 2016-12-24 (×3): 3 [IU] via SUBCUTANEOUS
  Filled 2016-12-20: qty 8
  Filled 2016-12-20: qty 3
  Filled 2016-12-20: qty 2
  Filled 2016-12-20: qty 3
  Filled 2016-12-20: qty 8
  Filled 2016-12-20: qty 3
  Filled 2016-12-20: qty 8
  Filled 2016-12-20 (×2): qty 2
  Filled 2016-12-20 (×4): qty 3
  Filled 2016-12-20: qty 2
  Filled 2016-12-20: qty 8
  Filled 2016-12-20 (×3): qty 3
  Filled 2016-12-20: qty 5

## 2016-12-20 MED ORDER — VITAL HIGH PROTEIN PO LIQD: 1000.0000 mL | ORAL | Status: DC

## 2016-12-20 MED ORDER — VITAL HIGH PROTEIN PO LIQD
1000.0000 mL | ORAL | Status: DC
  Administered 2016-12-20 – 2016-12-23 (×6): 1000 mL

## 2016-12-20 MED ORDER — METHYLPREDNISOLONE SODIUM SUCC 40 MG IJ SOLR
40.0000 mg | Freq: Two times a day (BID) | INTRAMUSCULAR | Status: DC
  Administered 2016-12-20: 40 mg via INTRAVENOUS
  Filled 2016-12-20: qty 1

## 2016-12-20 MED ORDER — ALBUTEROL SULFATE (2.5 MG/3ML) 0.083% IN NEBU: 5.0000 mg | INHALATION_SOLUTION | RESPIRATORY_TRACT | Status: DC | PRN

## 2016-12-20 MED ORDER — DEXTROSE 5 % IV SOLN
2.0000 g | Freq: Three times a day (TID) | INTRAVENOUS | Status: DC
  Administered 2016-12-20 – 2016-12-22 (×5): 2 g via INTRAVENOUS
  Filled 2016-12-20 (×7): qty 2

## 2016-12-20 MED ORDER — ENOXAPARIN SODIUM 40 MG/0.4ML ~~LOC~~ SOLN
40.0000 mg | SUBCUTANEOUS | Status: DC
  Administered 2016-12-20 – 2016-12-23 (×4): 40 mg via SUBCUTANEOUS
  Filled 2016-12-20 (×4): qty 0.4

## 2016-12-20 NOTE — Progress Notes (Signed)
Dr. Alva Garnet present and gave order that it's okay to use OG tube.  Xray verified.

## 2016-12-20 NOTE — Progress Notes (Signed)
Visit made. Patient seen lying in bed currently sedated, remains on the ventilator receiving IV antibiotics and oral/gastric tube feeds. Per chart note review and conversation with CCM NP Hinton Dyer, she failed a spontaneous breathing trial today. This will be attempted again tomorrow.She has required PRN doses of versed and 5 doses of fentanyl for symptom management. No family present. Will continue to follow and update hospice team. Updated notes faxed to referral. [name] RN, BSN, [facility], hospital liaison [phone] c

## 2016-12-20 NOTE — Progress Notes (Addendum)
Inpatient Diabetes Program Recommendations  AACE/ADA: New Consensus Statement on Inpatient Glycemic Control (2015)  Target Ranges:  Prepandial:   less than 140 mg/dL      Peak postprandial:   less than 180 mg/dL (1-2 hours)      Critically ill patients:  140 - 180 mg/dL   Lab Results  Component Value Date   GLUCAP 150 (H) 12/20/2016   HGBA1C 6.2 (H) 05/08/2016    Inpatient Diabetes Program Recommendations:  Outpatient Diabetes medications: Levemir 28 units qhs, Novolog 0-20 units Novolog tid with meals  Current orders for Inpatient glycemic control: Novolog 0-20 units q4h  Inpatient Diabetes Program Recommendations:  Since the now meets all criteria ; NPO, tube feeds and intubated, consider ordering the ICU Glycemic control order set Phase 1.   If the IV order set is not used, please consider adding Novolog 2 units q4h tube feed coverage (hold if tube feed is held or discontinued).    Gentry Fitz, RN, BA, MHA, CDE Diabetes Coordinator Inpatient Diabetes Program  [phone] (Team Pager) [phone] ([place]) 12/20/2016 2:45 PM

## 2016-12-20 NOTE — Progress Notes (Signed)
Initial Nutrition Assessment  DOCUMENTATION CODES:   Not applicable  INTERVENTION:  -TF: recommend Vital High Protein at goal rate of 70 ml/hr providing 1680 kcals, 148 g of protein and 1411 mL of free water. PEPuP initiated. Meets 100% estimated calorie and protein needs  NUTRITION DIAGNOSIS:   Inadequate oral intake related to acute illness as evidenced by NPO status.  GOAL:   Provide needs based on ASPEN/SCCM guidelines  MONITOR:   TF tolerance, Vent status, Labs, Weight trends  REASON FOR ASSESSMENT:   Ventilator    ASSESSMENT:   76 yo female admitted with acute on chronic respiratory failure with COPD exacerbation, multifocal pneumonia. Pt with hx of COPD, DM, CHF, breast cancer (1998), GERD   Patient is currently intubated on ventilator support MV: 8.2 L/min Temp (24hrs), Avg:99.1 F (37.3 C), Min:98.4 F (36.9 C), Max:99.8 F (37.7 C)  Consult for initiation/management of enteral nutrition received OG tube with tip in stomach  Labs: CBS 207-217 Meds: solumedrol  Diet Order:   NPO  Skin:  Reviewed, no issues  Last BM:  no documented BM  Height:   Ht Readings from Last 1 Encounters:  12/20/2016 5\' 5"  (1.651 m)    Weight:   Wt Readings from Last 1 Encounters:  12/20/16 179 lb 14.3 oz (81.6 kg)   BMI:  Body mass index is 29.94 kg/m.  Estimated Nutritional Needs:   Kcal:  4076 kcals  Protein:  98-164 g  Fluid:  >/= 1.5 L  EDUCATION NEEDS:   No education needs identified at this time  [place], [place], [place] [phone] Pager  [phone] Weekend/On-Call Pager

## 2016-12-20 NOTE — Progress Notes (Addendum)
Inpatient Diabetes Program Recommendations  AACE/ADA: New Consensus Statement on Inpatient Glycemic Control (2015)  Target Ranges:  Prepandial:   less than 140 mg/dL      Peak postprandial:   less than 180 mg/dL (1-2 hours)      Critically ill patients:  140 - 180 mg/dL   Lab Results  Component Value Date   GLUCAP 207 (H) 12/20/2016   HGBA1C 6.2 (H) 05/08/2016    Review of Glycemic Control Results for Kaitlin Henderson, Kaitlin Henderson (MRN [id number]) as of 12/20/2016 09:27  Ref. Range 12/21/2016 21:13 12/20/2016 07:24  Glucose-Capillary Latest Ref Range: 65 - 99 mg/dL 217 (H) 207 (H)     Diabetes history: Type 2 Outpatient Diabetes medications: Levemir 28 units qhs, Novolog 0-20 units Novolog tid with meals Current orders for Inpatient glycemic control: Novolog 0-20 units tid, Novolog 0-5 units qhs  Inpatient Diabetes Program Recommendations:  Since the patient is NPO and intubated, consider ordering the ICU Glycemic control order set Phase 1.   Gentry Fitz, RN, BA, MHA, CDE Diabetes Coordinator Inpatient Diabetes Program  [phone] (Team Pager) [phone] ([place]) 12/20/2016 9:37 AM

## 2016-12-20 NOTE — Progress Notes (Signed)
PULMONARY / CRITICAL CARE MEDICINE   Name: Kaitlin Henderson MRN: [id number] DOB: 07/26/1941    ADMISSION DATE:  12/04/2016 CONSULTATION DATE:  12/27/2016  REFERRING MD:  Dr. Ether Griffins  PT PROFILE:  76 y.o. F  Former smoker with O2 dependent COPD, SNF resident admitted via ED acute on chronic hypoxemic and hypercarbic respiratory failure due to AECOPD and concern for HCAP  MAJOR EVENTS/TEST RESULTS: 04/19 admitted as above 04/19 CTA chest: No PE. Chronic pleuroparenchymal changes. Minimal patchy irregular densities   INDWELLING DEVICES:: ETT 04/19 >>   MICRO DATA: MRSA PCR 04/19 >> NEG Urine 04/19 >> UA negative  Resp 04/19 >>  Blood 04/19 >>   ANTIMICROBIALS:  Vanc 04/19 >>  Cefepime 04/19 >>    SUBJECTIVE:  RASS -3, not F/C. Did not tolerate PSV mode  VITAL SIGNS: BP (!) 132/52   Pulse 92   Temp 99.6 F (37.6 C) (Axillary)   Resp (!) 22   Ht 5\' 5"  (1.651 m)   Wt 81.6 kg (179 lb 14.3 oz)   SpO2 95%   BMI 29.94 kg/m   HEMODYNAMICS:    VENTILATOR SETTINGS: Vent Mode: PRVC FiO2 (%):  [45 %-60 %] 45 % Set Rate:  [15 bmp-20 bmp] 15 bmp Vt Set:  [500 mL] 500 mL PEEP:  [5 cmH20] 5 cmH20  INTAKE / OUTPUT: I/O last 3 completed shifts: In: 3903 [I.V.:6; IV Piggyback:3550] Out: 600 [Urine:600]  PHYSICAL EXAMINATION: General: Sedated, Intubated, RASS -3, not following commands Neuro:  CNs intact, moves all extremities, DTRs symmetric HEENT:  NCAT, sclerae white Cardiovascular:  Regular, no murmurs Lungs:  Few rhonchi, no wheezes Abdomen:  Soft, NT, +BS Ext: warm, no edema Skin:  Warm,dry and intact.  LABS:  BMET  Recent Labs Lab 12/15/2016 1316 12/20/16 0027  NA 145 144  K 4.3 3.7  CL 98* 106  CO2 38* 30  BUN 29* 21*  CREATININE 0.89 0.67  GLUCOSE 158* 231*    Electrolytes  Recent Labs Lab 12/06/2016 1316 12/20/16 0027  CALCIUM 9.4 8.0*    CBC  Recent Labs Lab 12/14/2016 1316 12/20/16 0027  WBC 11.2* 9.6  HGB 16.7* 14.0  HCT 51.6*  42.9  PLT 367 282    Coag's  Recent Labs Lab 12/22/2016 1316  APTT 29  INR 1.17    Sepsis Markers  Recent Labs Lab 12/05/2016 1316 12/11/2016 1614  LATICACIDVEN 1.1 1.2  PROCALCITON 0.23  --     ABG  Recent Labs Lab 12/08/2016 1317 12/23/2016 1529 12/18/2016 1642  PHART 7.23* 7.16* 7.34*  PCO2ART 102* 99* 56*  PO2ART 259* 104 71*    Liver Enzymes  Recent Labs Lab 12/04/2016 1316  AST 19  ALT 15  ALKPHOS 70  BILITOT 0.8  ALBUMIN 3.2*    Cardiac Enzymes  Recent Labs Lab 12/22/2016 1614 12/20/16 0027 12/20/16 0638  TROPONINI <0.03 0.03* 0.03*    Glucose  Recent Labs Lab 12/30/2016 1914 12/23/2016 2113 12/20/16 0724 12/20/16 1154 12/20/16 1554  GLUCAP 230* 217* 207* 150* 205*    CXR: Rotated film, decreased volume on right, no definite infiltrates or edema >>  ASSESSMENT / PLAN:  PULMONARY A: Acute on chronic hypoxic/hypercapnic respiratory failure AECOPD Patchy pulmonary infiltrates - possible PNA (doubt) Baseline severe COPD with chronic O2 dependence P:   Cont full vent support - settings reviewed and/or adjusted Cont vent bundle Daily SBT if/when meets criteria Nebulized steroids and BDs Systemic steroids  CARDIOVASCULAR A:  History  of CAD/CHF/AICD P:  Continuous  Telemetry Continue Aspirin  RENAL A:   Oliguria P:   Monitor BMET intermittently Monitor I/Os Correct electrolytes as indicated  GASTROINTESTINAL A:   Hx of GERD P:   SUP: parenteral famotidine Begin TF protocol 04/20  HEMATOLOGIC A:   No active issues P:  DVT px: Enoxaparin Monitor CBC intermittently Transfuse per usual guidelines  INFECTIOUS A:   Possible pneumonia P:   Monitor temp, WBC count Micro and abx as above  ENDOCRINE A:   DM type-2 P:   SSI protocol  NEUROLOGIC A:   ICU/ventilator associated discomfort P:   RASS goal: -1, -2  Precedex initiated 04/20 Fentanyl/versed PRN   FAMILY  - Updates: No family present at the  bedside   CCM time: 40 mins The above time includes time spent in consultation with patient and/or family members and reviewing care plan on multidisciplinary rounds  Merton Border, MD PCCM service Mobile [phone] Pager [phone] 12/20/2016 5:05 PM

## 2016-12-20 NOTE — Progress Notes (Signed)
Pharmacy Antibiotic Note  Kaitlin Henderson is a 76 y.o. female admitted on 12/02/2016 with pneumonia.  Pharmacy has been consulted for cefepime and vancomycin  dosing. Vancomycin d/c 4/20.   Plan: Increase cefepime to 2 g iv q 8 hours.   Height: 5\' 5"  (165.1 cm) Weight: 179 lb 14.3 oz (81.6 kg) IBW/kg (Calculated) : 57  Temp (24hrs), Avg:99.1 F (37.3 C), Min:98.4 F (36.9 C), Max:99.8 F (37.7 C)   Recent Labs Lab 12/12/2016 1316 12/13/2016 1614 12/20/16 0027  WBC 11.2*  --  9.6  CREATININE 0.89  --  0.67  LATICACIDVEN 1.1 1.2  --     Estimated Creatinine Clearance: 63.1 mL/min (by C-G formula based on SCr of 0.67 mg/dL).    Allergies  Allergen Reactions  . Codeine Other (See Comments)    Pt states that this medication makes her pass out.   . Levofloxacin Other (See Comments)    Reaction:  Redness   . Sulfa Antibiotics Swelling    Antimicrobials this admission: cefepime 4/19 >>  vancomycin 4/19 >> 4/20  Dose adjustments this admission:   Microbiology results: 4/19 BCx: NGTD 4/19 UCx: NG Sputum: sent  MRSA PCR: negative  Thank you for allowing pharmacy to be a part of this patient's care.  Napoleon Form 12/20/2016 2:58 PM

## 2016-12-20 NOTE — Progress Notes (Signed)
[facility] NAME: Kaitlin Henderson    MR#:  [id number]  DATE OF BIRTH:  1940-10-30  SUBJECTIVE:  CHIEF COMPLAINT:   Chief Complaint  Patient presents with  . Code Sepsis  . Respiratory Distress   - end stage COPD on hospice but full code admitted with resp distress and intubated, Unable to be weaned  off vent today due to tachypnea  REVIEW OF SYSTEMS:  Review of Systems  Unable to perform ROS: Critical illness    DRUG ALLERGIES:   Allergies  Allergen Reactions  . Codeine Other (See Comments)    Pt states that this medication makes her pass out.   . Levofloxacin Other (See Comments)    Reaction:  Redness   . Sulfa Antibiotics Swelling    VITALS:  Blood pressure (!) 132/52, pulse 92, temperature 99.6 F (37.6 C), temperature source Axillary, resp. rate (!) 22, height 5\' 5"  (1.651 m), weight 81.6 kg (179 lb 14.3 oz), SpO2 95 %.  PHYSICAL EXAMINATION:  Physical Exam  GENERAL:  76 y.o.-year-old patient lying in the bed with no acute distress. Intubated EYES: Pupils equal, round, reactive to light and accommodation. No scleral icterus. Extraocular muscles intact.  HEENT: Head atraumatic, normocephalic. Oropharynx and nasopharynx clear.  NECK:  Supple, no jugular venous distention. No thyroid enlargement, no tenderness.  LUNGS: scant breath sounds bilaterally, no rales,rhonchi or crepitation. No use of accessory muscles of respiration. Decreased bibasilar breath sounds CARDIOVASCULAR: S1, S2 normal. No murmurs, rubs, or gallops.  ABDOMEN: Soft, nontender, nondistended. Bowel sounds present. No organomegaly or mass.  EXTREMITIES: No pedal edema, cyanosis, or clubbing.  NEUROLOGIC: mildly sedated, nodding head at times, able to withdraw all extremities to touch PSYCHIATRIC: The patient is sedated  SKIN: No obvious rash, lesion, or ulcer.    LABORATORY PANEL:   CBC  Recent Labs Lab 12/20/16 0027  WBC 9.6  HGB 14.0   HCT 42.9  PLT 282   ------------------------------------------------------------------------------------------------------------------  Chemistries   Recent Labs Lab 12/18/2016 1316 12/20/16 0027  NA 145 144  K 4.3 3.7  CL 98* 106  CO2 38* 30  GLUCOSE 158* 231*  BUN 29* 21*  CREATININE 0.89 0.67  CALCIUM 9.4 8.0*  AST 19  --   ALT 15  --   ALKPHOS 70  --   BILITOT 0.8  --    ------------------------------------------------------------------------------------------------------------------  Cardiac Enzymes  Recent Labs Lab 12/20/16 0638  TROPONINI 0.03*   ------------------------------------------------------------------------------------------------------------------  RADIOLOGY:  Dg Abd 1 View  Result Date: 12/20/2016 CLINICAL DATA:  Orogastric tube placement EXAM: ABDOMEN - 1 VIEW COMPARISON:  01/31/2016 abdominal CT FINDINGS: Limited rotated exam with overlapping hardware. A nasogastric tube tip overlaps the gastric antral region. Cholecystectomy clips. Normal visualized bowel gas pattern. IMPRESSION: Unremarkable positioning of nasogastric tube. Electronically Signed   By: Monte Fantasia M.D.   On: 12/20/2016 12:30   Ct Angio Chest Pe W And/or Wo Contrast  Result Date: 12/04/2016 CLINICAL DATA:  Respiratory distress.  Hypoxia. EXAM: CT ANGIOGRAPHY CHEST WITH CONTRAST TECHNIQUE: Multidetector CT imaging of the chest was performed using the standard protocol during bolus administration of intravenous contrast. Multiplanar CT image reconstructions and MIPs were obtained to evaluate the vascular anatomy. CONTRAST:  75 cc Isovue 370 COMPARISON:  Portable chest radiographs obtained earlier today and chest CT dated 02/22/2016. FINDINGS: Cardiovascular: Normally opacified pulmonary arteries with no pulmonary arterial filling defects seen. Atheromatous aortic calcification without aneurysm. Again demonstrated is mild coronary artery  calcification. Mediastinum/Nodes: Interval  mildly enlarged precarinal and right hilar lymph nodes. These include a precarinal node with a short axis diameter of 12 mm on image number 30 of series 4 in a right hilar node with a short axis diameter of 10 mm on image number 35 of series 4. Lungs/Pleura: There is more confluent opacity at the right lung apex with a less nodular appearance. Interval multiple small areas of patchy density in both upper lobes and in the right middle lobe and both lower lobes. There is also mild dependent atelectasis in the right lower lobe. No pleural fluid. Upper Abdomen: Cholecystectomy clips. Musculoskeletal: Thoracic spine degenerative changes. Review of the MIP images confirms the above findings. IMPRESSION: 1. No pulmonary emboli. 2. Multiple interval small, patchy irregular densities throughout both lungs, suspicious for atypical multifocal infection. Differential considerations include fungal and mycobacterial infections. 3. Mildly increased pleural and parenchymal scarring at the right lung apex. 4. Mild coronary artery atherosclerotic calcification. Electronically Signed   By: Claudie Revering M.D.   On: 12/14/2016 14:57   Dg Chest Portable 1 View  Result Date: 12/21/2016 CLINICAL DATA:  Status post intubation EXAM: PORTABLE CHEST 1 VIEW COMPARISON:  Pre intubation chest x-ray of today's date FINDINGS: The endotracheal tube tip lies approximately 1.2 cm above the carina. The lungs are well-expanded. The interstitial markings remain increased. The cardiac silhouette is enlarged. The pulmonary vascularity is prominent. There is calcification in the wall of the aortic arch. External pacemaker defibrillator pads are present. The bony structures exhibit no acute abnormality. IMPRESSION: The endotracheal tube tip projects approximately 1.2 cm above the carina. Withdrawal by 3 cm is recommended to avoid accidental mainstem bronchus intubation with patient movement. These results were called by me to Dr. Joni Fears in the emergency  department at 4:24 p.m. on 19 December 2016. Electronically Signed   By: David  Martinique M.D.   On: 12/06/2016 16:25   Dg Chest Portable 1 View  Result Date: 12/22/2016 CLINICAL DATA:  Respiratory distress. EXAM: PORTABLE CHEST 1 VIEW COMPARISON:  Earlier chest x-ray same date and earlier chest CT same date. FINDINGS: The cardiac silhouette, mediastinal and hilar contours are stable. Chronic lung changes but no confluent airspace process or pulmonary edema. No definite pleural effusions. IMPRESSION: Chronic lung changes without focal airspace process or pleural effusion. Electronically Signed   By: Marijo Sanes M.D.   On: 12/28/2016 15:42   Dg Chest Port 1 View  Result Date: 12/23/2016 CLINICAL DATA:  Respiratory distress today. Decreased oxygen saturation. EXAM: PORTABLE CHEST 1 VIEW COMPARISON:  Single-view of the chest 07/16/2016. PA and lateral chest 05/31/2016. CT chest 02/22/2016. FINDINGS: The lungs are emphysematous. Right apical opacity correlating with lesion seen on prior CT scan is again identified. No consolidative process, pneumothorax or effusion. Volume loss in the right chest is seen. Surgical clips right axilla are noted. Heart size is normal. IMPRESSION: No acute disease. Emphysema. Volume loss in the right chest may be related to treatment for right apical mass seen on prior CT. Electronically Signed   By: Inge Rise M.D.   On: 12/16/2016 13:44    EKG:   Orders placed or performed during the hospital encounter of 12/22/2016  . EKG 12-Lead  . EKG 12-Lead  . EKG 12-Lead  . EKG 12-Lead  . EKG 12-Lead  . EKG 12-Lead  . EKG 12-Lead  . EKG 12-Lead    ASSESSMENT AND PLAN:   76 year old female with past medical history significant for obesity, CAD, hyper tension,  hyperlipidemia, diabetes, depression, chronic respiratory failure secondary to COPD on 3 L home oxygen, history of breast cancer presents to the hospital secondary to worsening respiratory distress.  #1 acute hypoxic  respiratory failure-secondary to acute on chronic COPD exacerbation and questionable healthcare acquired pneumonia. -Appreciate pulmonary intensive care consult. -Patient is intubated and currently on vent. Still very tachypneic when trying to wean. Further management per intensive care team. -On IV steroids, nebs and inhalers.  #2 metabolic encephalopathy-secondary to CO2 retention. -Failed BiPAP, currently intubated and sedated. Monitor closely  #3 diabetes mellitus-currently on sliding scale insulin. Once tube feeds are started, recommend restarting her Levemir.  #4 depression and anxiety-meds can be restarted. Patient on Zoloft and Klonopin.  #5 DVT prophylaxis-on Lovenox  PT consult once extubated    All the records are reviewed and case discussed with Care Management/Social Workerr. Management plans discussed with the patient, family and they are in agreement.  CODE STATUS: Full Code  TOTAL TIME TAKING CARE OF THIS PATIENT: 38 minutes.   POSSIBLE D/C IN 2-3 DAYS, DEPENDING ON CLINICAL CONDITION.   Bayard More M.D on 12/20/2016 at 4:45 PM  Between 7am to 6pm - Pager - [phone]  After 6pm go to [email or web address] - password [email or web address] [facility]  Office  [phone]  CC: Primary care physician; No PCP Per Patient

## 2016-12-20 NOTE — Progress Notes (Addendum)
RN made Dr. Alva Garnet aware that patient has only had about 150 cc urine output so far this shift and that foley has been irrigated with good return.  MD acknowledged and said to monitor since patient has a good blood pressure and is not getting much fluid.

## 2016-12-21 ENCOUNTER — Inpatient Hospital Stay

## 2016-12-21 DIAGNOSIS — J449 Chronic obstructive pulmonary disease, unspecified: Secondary | ICD-10-CM

## 2016-12-21 DIAGNOSIS — J441 Chronic obstructive pulmonary disease with (acute) exacerbation: Secondary | ICD-10-CM

## 2016-12-21 LAB — CBC
HCT: 44.3 % (ref 35.0–47.0)
Hemoglobin: 14.3 g/dL (ref 12.0–16.0)
MCH: 31 pg (ref 26.0–34.0)
MCHC: 32.3 g/dL (ref 32.0–36.0)
MCV: 96 fL (ref 80.0–100.0)
PLATELETS: 257 10*3/uL (ref 150–440)
RBC: 4.61 MIL/uL (ref 3.80–5.20)
RDW: 15 % — AB (ref 11.5–14.5)
WBC: 10.1 10*3/uL (ref 3.6–11.0)

## 2016-12-21 LAB — BASIC METABOLIC PANEL
ANION GAP: 6 (ref 5–15)
BUN: 35 mg/dL — ABNORMAL HIGH (ref 6–20)
CHLORIDE: 107 mmol/L (ref 101–111)
CO2: 30 mmol/L (ref 22–32)
Calcium: 8.1 mg/dL — ABNORMAL LOW (ref 8.9–10.3)
Creatinine, Ser: 0.66 mg/dL (ref 0.44–1.00)
GFR calc non Af Amer: >60 mL/min (ref >60)
Glucose, Bld: 298 mg/dL — ABNORMAL HIGH (ref 65–99)
POTASSIUM: 4.1 mmol/L (ref 3.5–5.1)
Sodium: 143 mmol/L (ref 135–145)

## 2016-12-21 LAB — GLUCOSE, CAPILLARY
GLUCOSE-CAPILLARY: 268 mg/dL — AB (ref 65–99)
GLUCOSE-CAPILLARY: 255 mg/dL — AB (ref 65–99)
GLUCOSE-CAPILLARY: 254 mg/dL — AB (ref 65–99)
GLUCOSE-CAPILLARY: 199 mg/dL — AB (ref 65–99)
Glucose-Capillary: 182 mg/dL — ABNORMAL HIGH (ref 65–99)
Glucose-Capillary: 157 mg/dL — ABNORMAL HIGH (ref 65–99)

## 2016-12-21 LAB — HEMOGLOBIN A1C
Hgb A1c MFr Bld: 6 % — ABNORMAL HIGH (ref 4.8–5.6)
MEAN PLASMA GLUCOSE: 126 mg/dL

## 2016-12-21 LAB — MAGNESIUM: Magnesium: 2.2 mg/dL (ref 1.7–2.4)

## 2016-12-21 LAB — PHOSPHORUS: Phosphorus: 1.9 mg/dL — ABNORMAL LOW (ref 2.5–4.6)

## 2016-12-21 LAB — PROCALCITONIN

## 2016-12-21 MED ORDER — FENTANYL CITRATE (PF) 100 MCG/2ML IJ SOLN: 50.0000 ug | INTRAMUSCULAR | Status: DC | PRN

## 2016-12-21 MED ORDER — FREE WATER
100.0000 mL | Freq: Three times a day (TID) | Status: DC
  Administered 2016-12-21 – 2016-12-24 (×9): 100 mL

## 2016-12-21 MED ORDER — MIDAZOLAM HCL 2 MG/2ML IJ SOLN
1.0000 mg | INTRAMUSCULAR | Status: DC | PRN
  Administered 2016-12-21: 2 mg via INTRAVENOUS
  Filled 2016-12-21: qty 2

## 2016-12-21 MED ORDER — FENTANYL CITRATE (PF) 100 MCG/2ML IJ SOLN
50.0000 ug | INTRAMUSCULAR | Status: DC | PRN
  Administered 2016-12-21 (×3): 50 ug via INTRAVENOUS

## 2016-12-21 MED ORDER — MIDAZOLAM HCL 2 MG/2ML IJ SOLN
2.0000 mg | Freq: Once | INTRAMUSCULAR | Status: AC
  Administered 2016-12-21: 2 mg via INTRAVENOUS

## 2016-12-21 MED ORDER — K PHOS MONO-SOD PHOS DI & MONO 155-852-130 MG PO TABS
500.0000 mg | ORAL_TABLET | Freq: Once | ORAL | Status: AC
  Administered 2016-12-21: 500 mg via NASOGASTRIC
  Filled 2016-12-21: qty 2

## 2016-12-21 MED ORDER — INSULIN ASPART 100 UNIT/ML ~~LOC~~ SOLN
4.0000 [IU] | SUBCUTANEOUS | Status: DC
  Administered 2016-12-21 – 2016-12-24 (×18): 4 [IU] via SUBCUTANEOUS
  Filled 2016-12-21 (×18): qty 4

## 2016-12-21 MED ORDER — MIDAZOLAM HCL 2 MG/2ML IJ SOLN: 1.0000 mg | INTRAMUSCULAR | Status: DC | PRN

## 2016-12-21 MED ORDER — CHLORHEXIDINE GLUCONATE 0.12% ORAL RINSE (MEDLINE KIT)
15.0000 mL | Freq: Two times a day (BID) | OROMUCOSAL | Status: DC
  Administered 2016-12-22 – 2016-12-24 (×5): 15 mL via OROMUCOSAL

## 2016-12-21 MED ORDER — FENTANYL 2500MCG IN NS 250ML (10MCG/ML) PREMIX INFUSION
0.0000 ug/h | INTRAVENOUS | Status: DC
  Administered 2016-12-21: 25 ug/h via INTRAVENOUS
  Administered 2016-12-22: 100 ug/h via INTRAVENOUS
  Administered 2016-12-23: 250 ug/h via INTRAVENOUS
  Administered 2016-12-24: 100 ug/h via INTRAVENOUS
  Filled 2016-12-21 (×4): qty 250

## 2016-12-21 MED ORDER — FENTANYL CITRATE (PF) 100 MCG/2ML IJ SOLN
100.0000 ug | Freq: Once | INTRAMUSCULAR | Status: AC
  Administered 2016-12-21: 100 ug via INTRAVENOUS

## 2016-12-21 MED ORDER — INSULIN GLARGINE 100 UNIT/ML ~~LOC~~ SOLN
20.0000 [IU] | Freq: Every day | SUBCUTANEOUS | Status: DC
  Administered 2016-12-21 – 2016-12-23 (×3): 20 [IU] via SUBCUTANEOUS
  Filled 2016-12-21 (×4): qty 0.2

## 2016-12-21 MED ORDER — ORAL CARE MOUTH RINSE
15.0000 mL | Freq: Four times a day (QID) | OROMUCOSAL | Status: DC
  Administered 2016-12-22 – 2016-12-24 (×12): 15 mL via OROMUCOSAL

## 2016-12-21 NOTE — Progress Notes (Signed)
PULMONARY / CRITICAL CARE MEDICINE   Name: Kaitlin Henderson MRN: [id number] DOB: 01-26-1941    ADMISSION DATE:  12/26/2016 CONSULTATION DATE:  12/29/2016  REFERRING MD:  Dr. Ether Griffins  PT PROFILE:  76 y.o. F  Former smoker with O2 dependent COPD, SNF resident admitted via ED acute on chronic hypoxemic and hypercarbic respiratory failure due to AECOPD and concern for HCAP  MAJOR EVENTS/TEST RESULTS: 04/19 admitted as above 04/19 CTA chest: No PE. Chronic pleuroparenchymal changes. Minimal patchy irregular densities  04/20 Wheezing resolved. Failed SBT 04/21 Failed SBT. Weaning in PSV mode initiated  INDWELLING DEVICES:: ETT 04/19 >>   MICRO DATA: MRSA PCR 04/19 >> NEG Urine 04/19 >> UA negative  Resp 04/19 >> not obtained Blood 04/19 >>   ANTIMICROBIALS:  Vanc 04/19 >> 04/21 Cefepime 04/19 >>    SUBJECTIVE:  RASS -3 on fentanyl/dex infusions, not F/C.   VITAL SIGNS: BP (!) 121/55   Pulse 74   Temp 99.3 F (37.4 C) (Axillary)   Resp (!) 22   Ht 5\' 5"  (1.651 m)   Wt 82 kg (180 lb 12.4 oz)   SpO2 95%   BMI 30.08 kg/m   HEMODYNAMICS:    VENTILATOR SETTINGS: Vent Mode: PSV FiO2 (%):  [28 %-45 %] 28 % Set Rate:  [15 bmp] 15 bmp Vt Set:  [500 mL] 500 mL PEEP:  [5 cmH20] 5 cmH20 Pressure Support:  [20 cmH20] 20 cmH20 Plateau Pressure:  [10 cmH20-16 cmH20] 16 cmH20  INTAKE / OUTPUT: I/O last 3 completed shifts: In: 2219.1 [I.V.:315.6; Other:10; NG/GT:1293.5; IV Piggyback:600] Out: 1443 [Urine:1190]  PHYSICAL EXAMINATION: General: Sedated, Intubated, RASS -3, not following commands Neuro:  CNs intact, moves all extremities, DTRs symmetric HEENT:  NCAT, sclerae white Cardiovascular:  Regular, no murmurs Lungs: Clear anteriorly Abdomen:  Soft, NT, +BS Ext: warm, no edema Skin:  Warm,dry and intact.  LABS:  BMET  Recent Labs Lab 12/21/2016 1316 12/20/16 0027 12/21/16 0449  NA 145 144 143  K 4.3 3.7 4.1  CL 98* 106 107  CO2 38* 30 30  BUN 29* 21*  35*  CREATININE 0.89 0.67 0.66  GLUCOSE 158* 231* 298*    Electrolytes  Recent Labs Lab 12/20/2016 1316 12/20/16 0027 12/21/16 0449  CALCIUM 9.4 8.0* 8.1*  MG  --   --  2.2  PHOS  --   --  1.9*    CBC  Recent Labs Lab 12/12/2016 1316 12/20/16 0027 12/21/16 0449  WBC 11.2* 9.6 10.1  HGB 16.7* 14.0 14.3  HCT 51.6* 42.9 44.3  PLT 367 282 257    Coag's  Recent Labs Lab 12/18/2016 1316  APTT 29  INR 1.17    Sepsis Markers  Recent Labs Lab 12/11/2016 1316 12/18/2016 1614 12/21/16 0449  LATICACIDVEN 1.1 1.2  --   PROCALCITON 0.23  --  <0.10    ABG  Recent Labs Lab 12/15/2016 1317 12/21/2016 1529 12/14/2016 1642  PHART 7.23* 7.16* 7.34*  PCO2ART 102* 99* 56*  PO2ART 259* 104 71*    Liver Enzymes  Recent Labs Lab 12/23/2016 1316  AST 19  ALT 15  ALKPHOS 70  BILITOT 0.8  ALBUMIN 3.2*    Cardiac Enzymes  Recent Labs Lab 12/12/2016 1614 12/20/16 0027 12/20/16 0638  TROPONINI <0.03 0.03* 0.03*    Glucose  Recent Labs Lab 12/20/16 2016 12/20/16 2351 12/21/16 0416 12/21/16 0536 12/21/16 0741 12/21/16 1209  GLUCAP 256* 255* 268* 255* 254* 199*    CXR: No definite edema or infiltrates  ASSESSMENT / PLAN:  PULMONARY A: Acute on chronic hypoxic/hypercapnic respiratory failure AECOPD - wheezing resolved Patchy pulmonary infiltrates - soft Baseline severe COPD with chronic O2 dependence P:   Cont vent support - settings reviewed and/or adjusted Wean in PSV as tolerated Cont vent bundle Daily SBT if/when meets criteria Continue nebulized steroids and BDs DC systemic steroids  CARDIOVASCULAR A:  History  of CAD/CHF/AICD P:  Continuous telemetry Continue aspirin  RENAL A:   Oliguria P:   Monitor BMET intermittently Monitor I/Os Correct electrolytes as indicated  GASTROINTESTINAL A:   Hx of GERD P:   SUP: parenteral famotidine Continue TF protocol - initiated 04/20  HEMATOLOGIC A:   No active issues P:  DVT px:  Enoxaparin Monitor CBC intermittently Transfuse per usual guidelines  INFECTIOUS A:   Possible pneumonia P:   Monitor temp, WBC count Micro and abx as above  ENDOCRINE A:   DM type-2 P:   Lantus initiated 4/21 Continue SSI protocol  NEUROLOGIC A:   ICU/ventilator associated discomfort P:   RASS goal: -1, -2  Continue Precedex initiated 04/20  Continue fentanyl infusion   FAMILY  - Updates: No family present at the bedside   CCM time: 30 mins The above time includes time spent in consultation with patient and/or family members and reviewing care plan on multidisciplinary rounds  Merton Border, MD PCCM service Mobile [phone] Pager [phone] 12/21/2016 2:30 PM

## 2016-12-21 NOTE — Progress Notes (Addendum)
[facility] [place] NAME: Kaitlin Henderson    MR#:  [id number]  DATE OF BIRTH:  Sep 17, 1940  SUBJECTIVE:  CHIEF COMPLAINT:   Chief Complaint  Patient presents with  . Code Sepsis  . Respiratory Distress   -  Remains on the vent, increase. on precedex drip, but agitation last night and now also on fentanyl gtt - low grade fever  REVIEW OF SYSTEMS:  Review of Systems  Unable to perform ROS: Critical illness    DRUG ALLERGIES:   Allergies  Allergen Reactions  . Codeine Other (See Comments)    Pt states that this medication makes her pass out.   . Levofloxacin Other (See Comments)    Reaction:  Redness   . Sulfa Antibiotics Swelling    VITALS:  Blood pressure 140/69, pulse 66, temperature 99.3 F (37.4 C), temperature source Axillary, resp. rate 20, height 5\' 5"  (1.651 m), weight 82 kg (180 lb 12.4 oz), SpO2 96 %.  PHYSICAL EXAMINATION:  Physical Exam  GENERAL:  76 y.o.-year-old patient lying in the bed with no acute distress. Intubated EYES: Pupils equal, round, reactive to light and accommodation. No scleral icterus. Extraocular muscles intact.  HEENT: Head atraumatic, normocephalic. Oropharynx and nasopharynx clear.  NECK:  Supple, no jugular venous distention. No thyroid enlargement, no tenderness.  LUNGS: scant breath sounds bilaterally, no rales,rhonchi or crepitation. No use of accessory muscles of respiration. Decreased bibasilar breath sounds CARDIOVASCULAR: S1, S2 normal. No murmurs, rubs, or gallops.  ABDOMEN: Soft, nontender, nondistended. Bowel sounds present. No organomegaly or mass.  EXTREMITIES: No pedal edema, cyanosis, or clubbing.  NEUROLOGIC: sedated at this time PSYCHIATRIC: The patient is sedated  SKIN: No obvious rash, lesion, or ulcer.    LABORATORY PANEL:   CBC  Recent Labs Lab 12/21/16 0449  WBC 10.1  HGB 14.3  HCT 44.3  PLT 257    ------------------------------------------------------------------------------------------------------------------  Chemistries   Recent Labs Lab 12/29/2016 1316  12/21/16 0449  NA 145  < > 143  K 4.3  < > 4.1  CL 98*  < > 107  CO2 38*  < > 30  GLUCOSE 158*  < > 298*  BUN 29*  < > 35*  CREATININE 0.89  < > 0.66  CALCIUM 9.4  < > 8.1*  MG  --   --  2.2  AST 19  --   --   ALT 15  --   --   ALKPHOS 70  --   --   BILITOT 0.8  --   --   < > = values in this interval not displayed. ------------------------------------------------------------------------------------------------------------------  Cardiac Enzymes  Recent Labs Lab 12/20/16 0638  TROPONINI 0.03*   ------------------------------------------------------------------------------------------------------------------  RADIOLOGY:  Dg Abd 1 View  Result Date: 12/20/2016 CLINICAL DATA:  Orogastric tube placement EXAM: ABDOMEN - 1 VIEW COMPARISON:  01/31/2016 abdominal CT FINDINGS: Limited rotated exam with overlapping hardware. A nasogastric tube tip overlaps the gastric antral region. Cholecystectomy clips. Normal visualized bowel gas pattern. IMPRESSION: Unremarkable positioning of nasogastric tube. Electronically Signed   By: Monte Fantasia M.D.   On: 12/20/2016 12:30   Ct Angio Chest Pe W And/or Wo Contrast  Result Date: 12/12/2016 CLINICAL DATA:  Respiratory distress.  Hypoxia. EXAM: CT ANGIOGRAPHY CHEST WITH CONTRAST TECHNIQUE: Multidetector CT imaging of the chest was performed using the standard protocol during bolus administration of intravenous contrast. Multiplanar CT image reconstructions and MIPs were obtained to evaluate the vascular anatomy. CONTRAST:  75 cc Isovue 370 COMPARISON:  Portable chest radiographs obtained earlier today and chest CT dated 02/22/2016. FINDINGS: Cardiovascular: Normally opacified pulmonary arteries with no pulmonary arterial filling defects seen. Atheromatous aortic calcification  without aneurysm. Again demonstrated is mild coronary artery calcification. Mediastinum/Nodes: Interval mildly enlarged precarinal and right hilar lymph nodes. These include a precarinal node with a short axis diameter of 12 mm on image number 30 of series 4 in a right hilar node with a short axis diameter of 10 mm on image number 35 of series 4. Lungs/Pleura: There is more confluent opacity at the right lung apex with a less nodular appearance. Interval multiple small areas of patchy density in both upper lobes and in the right middle lobe and both lower lobes. There is also mild dependent atelectasis in the right lower lobe. No pleural fluid. Upper Abdomen: Cholecystectomy clips. Musculoskeletal: Thoracic spine degenerative changes. Review of the MIP images confirms the above findings. IMPRESSION: 1. No pulmonary emboli. 2. Multiple interval small, patchy irregular densities throughout both lungs, suspicious for atypical multifocal infection. Differential considerations include fungal and mycobacterial infections. 3. Mildly increased pleural and parenchymal scarring at the right lung apex. 4. Mild coronary artery atherosclerotic calcification. Electronically Signed   By: Claudie Revering M.D.   On: 12/18/2016 14:57   Dg Chest Port 1 View  Result Date: 12/21/2016 CLINICAL DATA:  Respiratory failure EXAM: PORTABLE CHEST 1 VIEW COMPARISON:  12/10/2016 FINDINGS: Portable view chest. The endotracheal tube tip is approximately 3 cm superior to the carina. Esophageal tube tip projects over the distal stomach. Low lung volumes. Tiny bilateral effusions. Stable cardiomegaly with minimal central congestion. IMPRESSION: 1. Low lung volumes 2. Tiny bilateral effusions 3. Stable mild cardiomegaly with minimal central congestion. No edema or focal infiltrate. Electronically Signed   By: Donavan Foil M.D.   On: 12/21/2016 03:45   Dg Chest Portable 1 View  Result Date:  CLINICAL DATA:  Status post intubation EXAM:  PORTABLE CHEST 1 VIEW COMPARISON:  Pre intubation chest x-ray of today's date FINDINGS: The endotracheal tube tip lies approximately 1.2 cm above the carina. The lungs are well-expanded. The interstitial markings remain increased. The cardiac silhouette is enlarged. The pulmonary vascularity is prominent. There is calcification in the wall of the aortic arch. External pacemaker defibrillator pads are present. The bony structures exhibit no acute abnormality. IMPRESSION: The endotracheal tube tip projects approximately 1.2 cm above the carina. Withdrawal by 3 cm is recommended to avoid accidental mainstem bronchus intubation with patient movement. These results were called by me to Dr. Joni Fears in the emergency department at 4:24 p.m. on 19 December 2016. Electronically Signed   By: David  Martinique M.D.   On: 12/13/2016 16:25   Dg Chest Portable 1 View  Result Date: 12/29/2016 CLINICAL DATA:  Respiratory distress. EXAM: PORTABLE CHEST 1 VIEW COMPARISON:  Earlier chest x-ray same date and earlier chest CT same date. FINDINGS: The cardiac silhouette, mediastinal and hilar contours are stable. Chronic lung changes but no confluent airspace process or pulmonary edema. No definite pleural effusions. IMPRESSION: Chronic lung changes without focal airspace process or pleural effusion. Electronically Signed   By: Marijo Sanes M.D.   On: 12/03/2016 15:42   Dg Chest Port 1 View  Result Date: 12/14/2016 CLINICAL DATA:  Respiratory distress today. Decreased oxygen saturation. EXAM: PORTABLE CHEST 1 VIEW COMPARISON:  Single-view of the chest 07/16/2016. PA and lateral chest 05/31/2016. CT chest 02/22/2016. FINDINGS: The lungs are emphysematous. Right apical opacity correlating with lesion seen  on prior CT scan is again identified. No consolidative process, pneumothorax or effusion. Volume loss in the right chest is seen. Surgical clips right axilla are noted. Heart size is normal. IMPRESSION: No acute disease. Emphysema.  Volume loss in the right chest may be related to treatment for right apical mass seen on prior CT. Electronically Signed   By: Inge Rise M.D.   On: 12/02/2016 13:44    EKG:   Orders placed or performed during the hospital encounter of 12/28/2016  . EKG 12-Lead  . EKG 12-Lead  . EKG 12-Lead  . EKG 12-Lead  . EKG 12-Lead  . EKG 12-Lead  . EKG 12-Lead  . EKG 12-Lead    ASSESSMENT AND PLAN:   76 year old female with past medical history significant for obesity, CAD, hyper tension, hyperlipidemia, diabetes, depression, chronic respiratory failure secondary to COPD on 3 L home oxygen, history of breast cancer presents to the hospital secondary to worsening respiratory distress.  #1 acute hypoxic respiratory failure-secondary to acute on chronic COPD exacerbation and questionable healthcare acquired pneumonia. -Appreciate pulmonary intensive care consult. -Patient is intubated and remains on vent. plan to wean. Further management per intensive care team. -On  steroids, nebs and inhalers. -on cefepime  #2 metabolic encephalopathy-secondary to CO2 retention. -Failed BiPAP, currently intubated and sedated. Monitor closely  #3 diabetes mellitus-currently on sliding scale insulin. Since  tube feeds are started, recommend restarting her Levemir.  #4 depression and anxiety-meds can be restarted. Patient on Zoloft and Klonopin.  #5 DVT prophylaxis-on Lovenox  PT consult once extubated    All the records are reviewed and case discussed with Care Management/Social Workerr. Management plans discussed with the patient, family and they are in agreement.  CODE STATUS: Full Code  TOTAL TIME TAKING CARE OF THIS PATIENT: 38 minutes.   POSSIBLE D/C IN 2-3 DAYS, DEPENDING ON CLINICAL CONDITION.   Gladstone Lighter M.D on 12/21/2016 at 1:01 PM  Between 7am to 6pm - Pager - [phone]  After 6pm go to [email or web address] - password [email or web address] [facility]  Office   [phone]  CC: Primary care physician; No PCP Per Patient

## 2016-12-21 NOTE — Progress Notes (Signed)
Patient remained on precedex drip throughout night, had to increase drip to keep patient calm and not fighting the ventilator. PRN Fentanyl and versed used frequently. Patient becomes agitated during patient care.

## 2016-12-21 NOTE — Progress Notes (Signed)
Report given to Chrys Racer, RN who is now taking over patient's care.

## 2016-12-21 NOTE — Progress Notes (Signed)
RN spoke with Dr. Alva Garnet and asked about waking patient up.  Patient was agitated this morning at the start of shift and fentanyl was started to keep patient from biting ET tube.  MD stated "don't change the sedation but I do want to do a vent weaning trial."

## 2016-12-22 ENCOUNTER — Inpatient Hospital Stay

## 2016-12-22 LAB — PHOSPHORUS: Phosphorus: 2.6 mg/dL (ref 2.5–4.6)

## 2016-12-22 LAB — BLOOD GAS, ARTERIAL
Acid-Base Excess: 7.9 mmol/L — ABNORMAL HIGH (ref 0.0–2.0)
BICARBONATE: 35.3 mmol/L — AB (ref 20.0–28.0)
FIO2: 1
MECHANICAL RATE: 15
MECHVT: 500 mL
O2 SAT: 99.9 %
PATIENT TEMPERATURE: 37
PEEP: 5 cmH2O
PH ART: 7.37 (ref 7.350–7.450)
PO2 ART: 289 mmHg — AB (ref 83.0–108.0)
pCO2 arterial: 61 mmHg — ABNORMAL HIGH (ref 32.0–48.0)

## 2016-12-22 LAB — CBC
HCT: 38.6 % (ref 35.0–47.0)
HEMATOCRIT: 41.2 % (ref 35.0–47.0)
HEMOGLOBIN: 12.6 g/dL (ref 12.0–16.0)
Hemoglobin: 13.8 g/dL (ref 12.0–16.0)
MCH: 32.3 pg (ref 26.0–34.0)
MCH: 31.5 pg (ref 26.0–34.0)
MCHC: 33.5 g/dL (ref 32.0–36.0)
MCHC: 32.6 g/dL (ref 32.0–36.0)
MCV: 96.4 fL (ref 80.0–100.0)
MCV: 96.8 fL (ref 80.0–100.0)
Platelets: 214 10*3/uL (ref 150–440)
Platelets: 189 10*3/uL (ref 150–440)
RBC: 4.27 MIL/uL (ref 3.80–5.20)
RBC: 3.98 MIL/uL (ref 3.80–5.20)
RDW: 14.8 % — AB (ref 11.5–14.5)
RDW: 15.2 % — ABNORMAL HIGH (ref 11.5–14.5)
WBC: 8.5 10*3/uL (ref 3.6–11.0)
WBC: 8.5 10*3/uL (ref 3.6–11.0)

## 2016-12-22 LAB — BASIC METABOLIC PANEL
Anion gap: 8 (ref 5–15)
Anion gap: 6 (ref 5–15)
BUN: 31 mg/dL — AB (ref 6–20)
BUN: 34 mg/dL — AB (ref 6–20)
CALCIUM: 7.7 mg/dL — AB (ref 8.9–10.3)
CO2: 31 mmol/L (ref 22–32)
CO2: 32 mmol/L (ref 22–32)
CREATININE: 0.66 mg/dL (ref 0.44–1.00)
Calcium: 7.7 mg/dL — ABNORMAL LOW (ref 8.9–10.3)
Chloride: 105 mmol/L (ref 101–111)
Chloride: 106 mmol/L (ref 101–111)
Creatinine, Ser: 0.77 mg/dL (ref 0.44–1.00)
GFR calc Af Amer: >60 mL/min (ref >60)
GFR calc Af Amer: >60 mL/min (ref >60)
GLUCOSE: 192 mg/dL — AB (ref 65–99)
GLUCOSE: 144 mg/dL — AB (ref 65–99)
POTASSIUM: 3.3 mmol/L — AB (ref 3.5–5.1)
Potassium: 3.8 mmol/L (ref 3.5–5.1)
Sodium: 144 mmol/L (ref 135–145)
Sodium: 144 mmol/L (ref 135–145)

## 2016-12-22 LAB — GLUCOSE, CAPILLARY
GLUCOSE-CAPILLARY: 123 mg/dL — AB (ref 65–99)
GLUCOSE-CAPILLARY: 149 mg/dL — AB (ref 65–99)
GLUCOSE-CAPILLARY: 159 mg/dL — AB (ref 65–99)
GLUCOSE-CAPILLARY: 181 mg/dL — AB (ref 65–99)
Glucose-Capillary: 141 mg/dL — ABNORMAL HIGH (ref 65–99)
Glucose-Capillary: 152 mg/dL — ABNORMAL HIGH (ref 65–99)
Glucose-Capillary: 182 mg/dL — ABNORMAL HIGH (ref 65–99)

## 2016-12-22 LAB — LACTIC ACID, PLASMA: Lactic Acid, Venous: 1.2 mmol/L (ref 0.5–1.9)

## 2016-12-22 LAB — MAGNESIUM: Magnesium: 1.8 mg/dL (ref 1.7–2.4)

## 2016-12-22 LAB — TROPONIN I: Troponin I: <0.03 ng/mL (ref <0.03)

## 2016-12-22 LAB — PROCALCITONIN: PROCALCITONIN: 0.11 ng/mL

## 2016-12-22 MED ORDER — SODIUM CHLORIDE 0.9 % IV BOLUS (SEPSIS)
500.0000 mL | INTRAVENOUS | Status: AC
  Administered 2016-12-22: 500 mL via INTRAVENOUS

## 2016-12-22 MED ORDER — FENTANYL BOLUS VIA INFUSION
100.0000 ug | Freq: Once | INTRAVENOUS | Status: AC
Start: 2016-12-22 — End: 2016-12-22
  Administered 2016-12-22: 100 ug via INTRAVENOUS

## 2016-12-22 MED ORDER — FENTANYL CITRATE (PF) 100 MCG/2ML IJ SOLN: 25.0000 ug | INTRAMUSCULAR | Status: DC | PRN

## 2016-12-22 MED ORDER — POTASSIUM CHLORIDE 20 MEQ/15ML (10%) PO SOLN
40.0000 meq | Freq: Two times a day (BID) | ORAL | Status: AC
  Administered 2016-12-22 (×2): 40 meq
  Filled 2016-12-22 (×2): qty 30

## 2016-12-22 MED ORDER — AMIODARONE IV BOLUS ONLY 150 MG/100ML
150.0000 mg | Freq: Once | INTRAVENOUS | Status: AC
  Administered 2016-12-22: 150 mg via INTRAVENOUS
  Filled 2016-12-22: qty 100

## 2016-12-22 MED ORDER — FAMOTIDINE 20 MG PO TABS
20.0000 mg | ORAL_TABLET | Freq: Two times a day (BID) | ORAL | Status: DC
  Administered 2016-12-22 – 2016-12-24 (×4): 20 mg
  Filled 2016-12-22 (×4): qty 1

## 2016-12-22 MED ORDER — MIDAZOLAM HCL 2 MG/2ML IJ SOLN
2.0000 mg | Freq: Once | INTRAMUSCULAR | Status: DC
Start: 2016-12-22 — End: 2016-12-24

## 2016-12-22 MED ORDER — CEFTRIAXONE SODIUM 1 G IJ SOLR
1.0000 g | INTRAMUSCULAR | Status: DC
  Administered 2016-12-22 – 2016-12-23 (×2): 1 g via INTRAVENOUS
  Filled 2016-12-22 (×2): qty 10

## 2016-12-22 MED ORDER — MIDAZOLAM HCL 2 MG/2ML IJ SOLN
1.0000 mg | INTRAMUSCULAR | Status: DC | PRN
  Administered 2016-12-22 – 2016-12-23 (×2): 1 mg via INTRAVENOUS
  Filled 2016-12-22 (×2): qty 2

## 2016-12-22 MED ORDER — PNEUMOCOCCAL VAC POLYVALENT 25 MCG/0.5ML IJ INJ
0.5000 mL | INJECTION | INTRAMUSCULAR | Status: AC
Start: 2016-12-23 — End: 2016-12-23
  Administered 2016-12-23: 0.5 mL via INTRAMUSCULAR
  Filled 2016-12-22: qty 0.5

## 2016-12-22 MED ORDER — MAGNESIUM SULFATE IN D5W 1-5 GM/100ML-% IV SOLN
1.0000 g | Freq: Once | INTRAVENOUS | Status: AC
  Administered 2016-12-22: 1 g via INTRAVENOUS
  Filled 2016-12-22: qty 100

## 2016-12-22 NOTE — Progress Notes (Signed)
Sats dropped to 82-83%. Suctioned by RN and sats back up to high 90s. RT came to room and changed vent setting. Did go up on Fentanyl to 100 mg. Continue to monitor.

## 2016-12-22 NOTE — Progress Notes (Signed)
PULMONARY / CRITICAL CARE MEDICINE   Name: Kaitlin Henderson MRN: [id number] DOB: 1940/10/27    ADMISSION DATE:  12/03/2016 CONSULTATION DATE:  12/01/2016  REFERRING MD:  Dr. Ether Griffins  PT PROFILE:  76 y.o. F  Former smoker with O2 dependent COPD, SNF resident admitted via ED acute on chronic hypoxemic and hypercarbic respiratory failure due to AECOPD and concern for HCAP  MAJOR EVENTS/TEST RESULTS: 04/19 admitted as above 04/19 CTA chest: No PE. Chronic pleuroparenchymal changes. Minimal patchy irregular densities  04/20 Wheezing resolved. Failed SBT 04/21 Failed SBT. Weaning in PSV mode initiated 04/22 Again failed SBT and did not tolerate PSV. Full vent support resumed  INDWELLING DEVICES:: ETT 04/19 >>   MICRO DATA: MRSA PCR 04/19 >> NEG Urine 04/19 >> UA negative  Resp 04/19 >> not obtained Blood 04/19 >> NEG  ANTIMICROBIALS:  Vanc 04/19 >> 04/21 Cefepime 04/19 >> 04/22 Ceftriaxone 04/22 >>    SUBJECTIVE:  RASS -1 on fentanyl/dex infusions, + F/C. failed SBT  VITAL SIGNS: BP (!) 84/43   Pulse 76   Temp 99.3 F (37.4 C) (Axillary)   Resp (!) 23   Ht 5\' 5"  (1.651 m)   Wt 82 kg (180 lb 12.4 oz)   SpO2 96%   BMI 30.08 kg/m   HEMODYNAMICS:    VENTILATOR SETTINGS: Vent Mode: PRVC FiO2 (%):  [35 %-40 %] 35 % Set Rate:  [15 bmp] 15 bmp Vt Set:  [500 mL] 500 mL PEEP:  [5 cmH20] 5 cmH20 Pressure Support:  [16 cmH20] 16 cmH20 Plateau Pressure:  [18 cmH20] 18 cmH20  INTAKE / OUTPUT: I/O last 3 completed shifts: In: 4054.9 [I.V.:923.9; NG/GT:2781; IV Piggyback:350] Out: 1900 [Urine:1900]  PHYSICAL EXAMINATION: General: Sedated, Intubated, RASS -1, follows commands intermittently Neuro:  CNs intact, moves all extremities, DTRs symmetric HEENT:  NCAT, sclerae white Cardiovascular:  Regular, no murmurs Lungs: Clear anteriorly Abdomen:  Soft, NT, +BS Ext: warm, no edema Skin:  Warm,dry and intact.  LABS:  BMET  Recent Labs Lab 12/20/16 0027  12/21/16 0449 12/22/16 0433  NA 144 143 144  K 3.7 4.1 3.3*  CL 106 107 105  CO2 30 30 31   BUN 21* 35* 31*  CREATININE 0.67 0.66 0.77  GLUCOSE 231* 298* 192*    Electrolytes  Recent Labs Lab 12/20/16 0027 12/21/16 0449 12/22/16 0433  CALCIUM 8.0* 8.1* 7.7*  MG  --  2.2  --   PHOS  --  1.9*  --     CBC  Recent Labs Lab 12/20/16 0027 12/21/16 0449 12/22/16 0433  WBC 9.6 10.1 8.5  HGB 14.0 14.3 13.8  HCT 42.9 44.3 41.2  PLT 282 257 214    Coag's  Recent Labs Lab 12/22/2016 1316  APTT 29  INR 1.17    Sepsis Markers  Recent Labs Lab 12/09/2016 1316 12/08/2016 1614 12/21/16 0449 12/22/16 0433  LATICACIDVEN 1.1 1.2  --   --   PROCALCITON 0.23  --  <0.10 0.11    ABG  Recent Labs Lab 12/22/2016 1317  1529 12/03/2016 1642  PHART 7.23* 7.16* 7.34*  PCO2ART 102* 99* 56*  PO2ART 259* 104 71*    Liver Enzymes  Recent Labs Lab 12/14/2016 1316  AST 19  ALT 15  ALKPHOS 70  BILITOT 0.8  ALBUMIN 3.2*    Cardiac Enzymes  Recent Labs Lab 12/16/2016 1614 12/20/16 0027 12/20/16 0638  TROPONINI <0.03 0.03* 0.03*    Glucose  Recent Labs Lab 12/21/16 1556 12/21/16 1944 12/21/16 2351 12/22/16 0418  12/22/16 0724 12/22/16 1138  GLUCAP 182* 157* 182* 152* 181* 159*    CXR: Chronic volume loss on the right, no acute disease  ASSESSMENT / PLAN:  PULMONARY A: Acute on chronic hypoxic/hypercapnic respiratory failure AECOPD - wheezing resolved Patchy pulmonary infiltrates - soft Baseline severe COPD with chronic O2 dependence Failure to wean-unclear reason P:   Cont vent support - settings reviewed and/or adjusted Wean in PSV as tolerated -  Cont vent bundle Daily SBT if/when meets criteria Continue nebulized steroids and BDs  CARDIOVASCULAR A:  History  of CAD/CHF/AICD P:  Continuous telemetry Continue aspirin  RENAL A:   Oliguria, resolved P:   Monitor BMET intermittently Monitor I/Os Correct electrolytes as  indicated  GASTROINTESTINAL A:   Hx of GERD P:   SUP: parenteral famotidine Continue TF protocol - initiated 04/20  HEMATOLOGIC A:   No active issues P:  DVT px: Enoxaparin Monitor CBC intermittently Transfuse per usual guidelines  INFECTIOUS A:   Possible pneumonia P:   Monitor temp, WBC count Micro and abx as above  ENDOCRINE A:   DM type-2 Hyperglycemia, controlled P:   Lantus initiated 4/21 Continue SSI protocol  NEUROLOGIC A:   ICU/ventilator associated discomfort P:   RASS goal: -1, -2  Continue Precedex initiated 04/20  Continue fentanyl infusion   FAMILY  - Updates: No family present at the bedside   CCM time: 40 mins The above time includes time spent in consultation with patient and/or family members and reviewing care plan on multidisciplinary rounds  Merton Border, MD PCCM service Mobile [phone] Pager [phone] 12/22/2016 3:06 PM

## 2016-12-22 NOTE — Progress Notes (Addendum)
Pt fentanyl infusion restarted at 12.5 d/t inc WOB, anxiousness, tachypnea, tachycardia

## 2016-12-22 NOTE — Plan of Care (Signed)
Problem: Safety: Goal: Ability to remain free from injury will improve Outcome: Completed/Met Date Met: 12/22/16 Bed alarm on, pt near nurses' station  Problem: Pain Managment: Goal: General experience of comfort will improve Outcome: Completed/Met Date Met: 12/22/16 Pt relaxed, no grimacing   

## 2016-12-22 NOTE — Progress Notes (Addendum)
[facility] [place] NAME: Kaitlin Henderson    MR#:  [id number]  DATE OF BIRTH:  [birth year]  SUBJECTIVE:  CHIEF COMPLAINT:   Chief Complaint  Patient presents with  . Code Sepsis  . Respiratory Distress   -  Patient was on pressure support wean yesterday, however overnight she became tachypneic and hypoxic. Secretions were suctioned. Currently placed on full vent support and FiO2 at 40%. -Also on fentanyl and Precedex for sedation. Not following commands to me this morning  REVIEW OF SYSTEMS:  Review of Systems  Unable to perform ROS: Critical illness    DRUG ALLERGIES:   Allergies  Allergen Reactions  . Codeine Other (See Comments)    Pt states that this medication makes her pass out.   . Levofloxacin Other (See Comments)    Reaction:  Redness   . Sulfa Antibiotics Swelling    VITALS:  Blood pressure 120/63, pulse 70, temperature 99.7 F (37.6 C), temperature source Axillary, resp. rate 17, height 5\' 5"  (1.651 m), weight 82 kg (180 lb 12.4 oz), SpO2 99 %.  PHYSICAL EXAMINATION:  Physical Exam  GENERAL:  76 y.o.-year-old patient lying in the bed with no acute distress. Intubated EYES: Pupils equal, round, reactive to light and accommodation. No scleral icterus. Extraocular muscles intact.  HEENT: Head atraumatic, normocephalic. Oropharynx and nasopharynx clear.  NECK:  Supple, no jugular venous distention. No thyroid enlargement, no tenderness.  LUNGS: scant breath sounds bilaterally, no rales,rhonchi or crepitation. No use of accessory muscles of respiration. Decreased bibasilar breath sounds CARDIOVASCULAR: S1, S2 normal. No murmurs, rubs, or gallops.  ABDOMEN: Soft, nontender, nondistended. Bowel sounds present. No organomegaly or mass.  EXTREMITIES: No pedal edema, cyanosis, or clubbing.  NEUROLOGIC: sedated at this time, not following commands today. PSYCHIATRIC: The patient is sedated  SKIN: No obvious rash, lesion,  or ulcer.    LABORATORY PANEL:   CBC  Recent Labs Lab 12/22/16 0433  WBC 8.5  HGB 13.8  HCT 41.2  PLT 214   ------------------------------------------------------------------------------------------------------------------  Chemistries   Recent Labs Lab 12/14/2016 1316  12/21/16 0449 12/22/16 0433  NA 145  < > 143 144  K 4.3  < > 4.1 3.3*  CL 98*  < > 107 105  CO2 38*  < > 30 31  GLUCOSE 158*  < > 298* 192*  BUN 29*  < > 35* 31*  CREATININE 0.89  < > 0.66 0.77  CALCIUM 9.4  < > 8.1* 7.7*  MG  --   --  2.2  --   AST 19  --   --   --   ALT 15  --   --   --   ALKPHOS 70  --   --   --   BILITOT 0.8  --   --   --   < > = values in this interval not displayed. ------------------------------------------------------------------------------------------------------------------  Cardiac Enzymes  Recent Labs Lab 12/20/16 0638  TROPONINI 0.03*   ------------------------------------------------------------------------------------------------------------------  RADIOLOGY:  Dg Abd 1 View  Result Date: 12/20/2016 CLINICAL DATA:  Orogastric tube placement EXAM: ABDOMEN - 1 VIEW COMPARISON:  01/31/2016 abdominal CT FINDINGS: Limited rotated exam with overlapping hardware. A nasogastric tube tip overlaps the gastric antral region. Cholecystectomy clips. Normal visualized bowel gas pattern. IMPRESSION: Unremarkable positioning of nasogastric tube. Electronically Signed   By: Monte Fantasia M.D.   On: 12/20/2016 12:30   Dg Chest Port 1 View  Result Date: 12/22/2016 CLINICAL DATA:  Patient with history of respiratory failure. EXAM: PORTABLE CHEST 1 VIEW COMPARISON:  Chest radiograph 12/21/2016. FINDINGS: ET tube terminates in the mid trachea. Enteric tube courses inferior to the diaphragm. Monitoring leads overlie the patient. Stable cardiac and mediastinal contours. Elevation right hemidiaphragm. Stable apical pleuroparenchymal thickening. No large area of pulmonary consolidation.  Minimal basilar heterogeneous opacities. No pleural effusion pneumothorax. Right axillary surgical clips. IMPRESSION: ET tube mid trachea. Elevation right hemidiaphragm with low lung volumes and basilar opacities favored to represent atelectasis. Electronically Signed   By: Lovey Newcomer M.D.   On: 12/22/2016 07:19   Dg Chest Port 1 View  Result Date: 12/21/2016 CLINICAL DATA:  Respiratory failure EXAM: PORTABLE CHEST 1 VIEW COMPARISON:  12/16/2016 FINDINGS: Portable view chest. The endotracheal tube tip is approximately 3 cm superior to the carina. Esophageal tube tip projects over the distal stomach. Low lung volumes. Tiny bilateral effusions. Stable cardiomegaly with minimal central congestion. IMPRESSION: 1. Low lung volumes 2. Tiny bilateral effusions 3. Stable mild cardiomegaly with minimal central congestion. No edema or focal infiltrate. Electronically Signed   By: Donavan Foil M.D.   On: 12/21/2016 03:45    EKG:   Orders placed or performed during the hospital encounter of 12/08/2016  . EKG 12-Lead  . EKG 12-Lead  . EKG 12-Lead  . EKG 12-Lead  . EKG 12-Lead  . EKG 12-Lead  . EKG 12-Lead  . EKG 12-Lead    ASSESSMENT AND PLAN:   76 year old female with past medical history significant for obesity, CAD, hyper tension, hyperlipidemia, diabetes, depression, chronic respiratory failure secondary to COPD on 3 L home oxygen, history of breast cancer presents to the hospital secondary to worsening respiratory distress.  #1 acute hypoxic respiratory failure-secondary to  healthcare acquired pneumonia. -Appreciate pulmonary intensive care consult. -Patient is intubated and remains on vent. plan to weanAs tolerated. Further management per intensive care team. -On  nebs and inhalers. -on cefepime. Systemic steroids have been discontinued. -Follow up chest x-ray today  #2 metabolic encephalopathy-secondary to CO2 retention. -Failed BiPAP, currently intubated and sedated. Monitor closely  #3  diabetes mellitus-currently on sliding scale insulin. Since  tube feeds are started, currently also on Lantus..  #4 depression and anxiety-meds can be restarted once her sedation is decreased. Patient on Zoloft and Klonopin.  #5 DVT prophylaxis-on Lovenox  #6 hypokalemia-recommend replacement  PT consult once extubated Recommend palliative care consultation as patient has end-stage COPD and followed by hospice services.     All the records are reviewed and case discussed with Care Management/Social Workerr. Management plans discussed with the patient, family and they are in agreement.  CODE STATUS: Full Code  TOTAL TIME TAKING CARE OF THIS PATIENT: 37 minutes.   POSSIBLE D/C IN 2-3 DAYS, DEPENDING ON CLINICAL CONDITION.   Gladstone Lighter M.D on 12/22/2016 at 8:44 AM  Between 7am to 6pm - Pager - [phone]  After 6pm go to [email or web address] - password [email or web address] [facility]  Office  [phone]  CC: Primary care physician; No PCP Per Patient

## 2016-12-22 NOTE — Progress Notes (Signed)
S: Called to the bedside for HR in the 170s. Patient had been in NSR with rates in the 80s and low 90s. Titrated off all sedation at ~1500.Was awake and following commands with intermittent agitation and anxiety when the rhythm change occurred. Extensive cardiac history but no h/o Afib  O:  Constitutional: NAD HENT: Normocephalic and atraumatic,  Pupils are equal, round, and reactive to light, neck is supple; trachea in midline Cardiovascular: Irregular-irregular with HR fluctuating between 150-180 bpm, no murmur, gallop and no friction rub.  Pulmonary/Chest: Intubated, bilateral breath sounds,  Abdominal: Soft, non-distended, normal bowel sounds Skin: diaphoretic   Assessment New onset Afib Acute hypoxic/hypercarbic respiratory failure  Plan Amiodarone 150 mg bolus x1 NS 1L bolus STAT CBC, BMP, MAG, PHOS, TROP, ABG and CXR Resume precedex and fentanyl gttes for sedation STAT EKG  At 2045 patient converted into NSR after IV fluids and amio bolus. CXR unchanged and labs pending Will continue to monitor  Dr. Alva Garnet and [facility] notified  Paton S. [facility] ANP-BC Pulmonary and Critical Care Medicine [facility] Pager [phone] or [phone]

## 2016-12-22 NOTE — NC FL2 (Signed)
[place] LEVEL OF CARE SCREENING TOOL     IDENTIFICATION  Patient Name: Kaitlin [facility] Birthdate: 13-Jul-1941 Sex: female Admission Date (Current Location): 12/28/2016  [place] and [place] Number:  Engineering geologist and Address:  [facility], [address], [place], [place] [address]      Provider Number: [id number]  Attending Physician Name and Address:  Gladstone Lighter, MD  Relative Name and Phone Number:       Current Level of Care: Hospital Recommended Level of Care: [place] Prior Approval Number:    Date Approved/Denied:   PASRR Number: [id number]  Discharge Plan: Domiciliary (Rest home)    Current Diagnoses: Patient Active Problem List   Diagnosis Date Noted  . Acute metabolic encephalopathy 31/49/7026  . Multifocal pneumonia 12/05/2016  . Elevated troponin 12/15/2016  . Leukocytosis 12/27/2016  . Generalized weakness 06/02/2016  . Lower back pain 06/02/2016  . Hip pain 06/02/2016  . Diarrhea 06/02/2016  . Diabetes ([place]) 06/02/2016  . Lung nodule 11/25/2015  . Anxiety 11/25/2015  . GERD (gastroesophageal reflux disease) 11/25/2015  . Cellulitis 11/16/2015  . 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 Acute on chronic respiratory failure with hypoxia and hypercapnia (HCC)   . Non-compliant behavior 12/05/2014  . Decreased mobility 08/15/2014  . Benign essential tremor 09/05/2011  . Breast cancer, right breast ([place])   . DEPRESSION 09/25/2010  . HAIR LOSS 06/26/2010  . Type 2 diabetes mellitus with neurological complications ([place]) 37/85/8850  . POLYURIA 03/13/2010  . APHTHOUS STOMATITIS 08/23/2009  . CHRONIC RESPIRATORY FAILURE 08/18/2007  . Hyperlipidemia 08/17/2007  . OBESITY 08/17/2007  . CORONARY ARTERY DISEASE 08/17/2007  . COPD with emphysema, [place]  08/17/2007    Orientation RESPIRATION BLADDER Height & Weight     Self  Vent Incontinent Weight: 180 lb 12.4 oz (82  kg) Height:  _0  (165.1 cm)  BEHAVIORAL SYMPTOMS/MOOD NEUROLOGICAL BOWEL NUTRITION STATUS      Incontinent Feeding tube  AMBULATORY STATUS COMMUNICATION OF NEEDS Skin   Extensive Assist Verbally                         Personal Care Assistance Level of Assistance  Bathing, Feeding, Dressing Bathing Assistance: Limited assistance Feeding assistance: Limited assistance Dressing Assistance: Limited assistance     Functional Limitations Info             SPECIAL CARE FACTORS FREQUENCY                       Contractures      Additional Factors Info  Allergies   Allergies Info: Codeine, Levofloxacin, Sulfa Antibiotics           Current Medications (12/22/2016):  This is the current hospital active medication list Current Facility-Administered Medications  Medication Dose Route Frequency Provider Last Rate Last Dose  . albuterol (PROVENTIL) (2.5 MG/3ML) 0.083% nebulizer solution 5 mg  5 mg Nebulization Q3H PRN Wilhelmina Mcardle, MD      . aspirin tablet 325 mg  325 mg Oral Daily Theodoro Grist, MD   325 mg at 12/22/16 0740  . budesonide (PULMICORT) nebulizer solution 0.25 mg  0.25 mg Nebulization Q6H Wilhelmina Mcardle, MD   0.25 mg at 12/22/16 1339  . cefTRIAXone (ROCEPHIN) 1 g in dextrose 5 % 50 mL IVPB  1 g Intravenous Q24H Wilhelmina Mcardle, MD   Stopped at 12/22/16 1128  . chlorhexidine  gluconate (MEDLINE KIT) (PERIDEX) 0.12 % solution 15 mL  15 mL Mouth Rinse BID Mikael Spray, NP   15 mL at 12/22/16 0734  . dexmedetomidine (PRECEDEX) 400 MCG/100ML (4 mcg/mL) infusion  0.4-1.2 mcg/kg/hr Intravenous Titrated Wilhelmina Mcardle, MD 6.1 mL/hr at 12/22/16 1300 0.299 mcg/kg/hr at 12/22/16 1300  . enoxaparin (LOVENOX) injection 40 mg  40 mg Subcutaneous Q24H Gladstone Lighter, MD   40 mg at 12/21/16 2155  . famotidine (PEPCID) tablet 20 mg  20 mg Per Tube BID Wilhelmina Mcardle, MD      . feeding supplement (VITAL HIGH PROTEIN) liquid 1,000 mL  1,000 mL Per Tube Continuous  Wilhelmina Mcardle, MD 70 mL/hr at 12/22/16 1300 1,000 mL at 12/22/16 1300  . fentaNYL (SUBLIMAZE) injection 25 mcg  25 mcg Intravenous Q1H PRN Wilhelmina Mcardle, MD      . fentaNYL 2538mg in NS 2529m(1010mml) infusion-PREMIX  0-100 mcg/hr Intravenous Continuous DavWilhelmina McardleD 2.5 mL/hr at 12/22/16 1300 25 mcg/hr at 12/22/16 1300  . free water 100 mL  100 mL Per Tube Q8H DavWilhelmina McardleD   100 mL at 12/22/16 1400  . insulin aspart (novoLOG) injection 0-15 Units  0-15 Units Subcutaneous Q4H DavWilhelmina McardleD   3 Units at 12/22/16 1214  . insulin aspart (novoLOG) injection 4 Units  4 Units Subcutaneous Q4H DavWilhelmina McardleD   4 Units at 12/22/16 1214  . insulin glargine (LANTUS) injection 20 Units  20 Units Subcutaneous QHS DavWilhelmina McardleD   20 Units at 12/21/16 2155  . ipratropium-albuterol (DUONEB) 0.5-2.5 (3) MG/3ML nebulizer solution 3 mL  3 mL Nebulization Q6H DavWilhelmina McardleD   3 mL at 12/22/16 1339  . MEDLINE mouth rinse  15 mL Mouth Rinse QID MagMikael SprayP   15 mL at 12/22/16 1215  . midazolam (VERSED) injection 1 mg  1 mg Intravenous Q2H PRN DavWilhelmina McardleD      . ondansetron (ZOProvidence - Park Hospitalnjection 4 mg  4 mg Intravenous Q6H PRN RimTheodoro GristD      . [STDerrill Memo 12/23/2016] pneumococcal 23 valent vaccine (PNU-IMMUNE) injection 0.5 mL  0.5 mL Intramuscular Tomorrow-1000 DavWilhelmina McardleD      . potassium chloride 20 MEQ/15ML (10%) solution 40 mEq  40 mEq Per Tube BID DavWilhelmina McardleD   40 mEq at 12/22/16 1022  . sodium chloride flush (NS) 0.9 % injection 3 mL  3 mL Intravenous Q12H RimTheodoro GristD   3 mL at 12/22/16 0741     Discharge Medications: Please see discharge summary for a list of discharge medications.  Relevant Imaging Results:  Relevant Lab Results:   Additional Information SS# [id number]

## 2016-12-22 NOTE — Clinical Social Work Note (Signed)
Clinical Social Work Assessment  Patient Details  Name: Kaitlin [facility] MRN: [id number] Date of Birth: 1941/04/07  Date of referral:  12/22/16               Reason for consult:  Facility Placement                Permission sought to share information with:  Facility Art therapist granted to share information::  Yes, Verbal Permission Granted  Name::        Agency::     Relationship::     Contact Information:     Housing/Transportation Living arrangements for the past 2 months:  [place] of Information:  Guardian Patient Interpreter Needed:  None Criminal Activity/Legal Involvement Pertinent to Current Situation/Hospitalization:  No - Comment as needed Significant Relationships:  Neighbor Lives with:  Facility Resident Do you feel safe going back to the place where you live?  Yes Need for family participation in patient care:  Yes (Comment)  Care giving concerns:  Patient admitted from [facility] Worker assessment / plan:  The CSW contacted the patient's legal guardian, [name], who reported that the patient will return to the [facility] in the facility. The patient is currently not stable for dc and will be inpatient for at least 2 more days.   Employment status:  Retired Forensic scientist:  Other (Comment Required) ([facility]) PT Recommendations:  Not assessed at this time Information / Referral to community resources:     Patient/Family's Response to care:  Mr. Donna Christen thanked CSW for information and assistance.  Patient/Family's Understanding of and Emotional Response to Diagnosis, Current Treatment, and Prognosis:  Patient's guardian understands that the patient is to return to the [facility] when stable and is in agreement.  Emotional Assessment Appearance:  Appears stated age Attitude/Demeanor/Rapport:  Lethargic Affect (typically observed):   Appropriate Orientation:  Oriented to Self Alcohol / Substance use:  Never Used Psych involvement (Current and /or in the community):  No (Comment)  Discharge Needs  Concerns to be addressed:  Care Coordination, Discharge Planning Concerns Readmission within the last 30 days:  No Current discharge risk:  Chronically ill, Cognitively Impaired Barriers to Discharge:  Continued Medical Work up   [employer], LCSW 12/22/2016, 2:02 PM

## 2016-12-22 NOTE — Progress Notes (Addendum)
Per Dr Alva Garnet adminster 500 cc bolus for SBP 70s, MAP 50s

## 2016-12-22 NOTE — Progress Notes (Signed)
Pt's fentanyl and precedex tapered today, stopped at 1445.  No s/sx distress, pt is more awake, remains on full vent support with FIO2 of 35%, rate 15.  Easily redirected when anxious.  Opens eyes on command.  Mitts off since bath.  Tolerating tube feeds, very active BS, no BM this shift.  Output adequate, bolused for low SBP and MAP. Still experiencing thick yellow oral secretions occasionally.

## 2016-12-22 NOTE — Progress Notes (Signed)
Patient in SVT in 170s, per NP Maggie go up on precedex and bolus fentanyl

## 2016-12-22 NOTE — Progress Notes (Signed)
Placed patient back on full support earlier due to increased wob and low sats. Patient much better now.

## 2016-12-23 ENCOUNTER — Inpatient Hospital Stay (HOSPITAL_COMMUNITY)
Admit: 2016-12-23 | Discharge: 2016-12-23 | Disposition: A | Discharge status: Home / Self Care | Attending: Adult Health | Admitting: Adult Health

## 2016-12-23 DIAGNOSIS — J189 Pneumonia, unspecified organism: Secondary | ICD-10-CM

## 2016-12-23 DIAGNOSIS — I4891 Unspecified atrial fibrillation: Secondary | ICD-10-CM

## 2016-12-23 LAB — BLOOD GAS, ARTERIAL
Acid-Base Excess: 4.9 mmol/L — ABNORMAL HIGH (ref 0.0–2.0)
Bicarbonate: 31.4 mmol/L — ABNORMAL HIGH (ref 20.0–28.0)
FIO2: 0.45
LHR: 15 {breaths}/min
O2 SAT: 92.3 %
PCO2 ART: 53 mmHg — AB (ref 32.0–48.0)
PEEP: 5 cmH2O
Patient temperature: 37
VT: 500 mL
pH, Arterial: 7.38 (ref 7.350–7.450)
pO2, Arterial: 66 mmHg — ABNORMAL LOW (ref 83.0–108.0)

## 2016-12-23 LAB — GLUCOSE, CAPILLARY
GLUCOSE-CAPILLARY: 82 mg/dL (ref 65–99)
GLUCOSE-CAPILLARY: 108 mg/dL — AB (ref 65–99)
GLUCOSE-CAPILLARY: 110 mg/dL — AB (ref 65–99)
Glucose-Capillary: 155 mg/dL — ABNORMAL HIGH (ref 65–99)
Glucose-Capillary: 163 mg/dL — ABNORMAL HIGH (ref 65–99)
Glucose-Capillary: 194 mg/dL — ABNORMAL HIGH (ref 65–99)

## 2016-12-23 LAB — BASIC METABOLIC PANEL
Anion gap: 4 — ABNORMAL LOW (ref 5–15)
BUN: 28 mg/dL — AB (ref 6–20)
CALCIUM: 7.8 mg/dL — AB (ref 8.9–10.3)
CO2: 32 mmol/L (ref 22–32)
CREATININE: 0.52 mg/dL (ref 0.44–1.00)
Chloride: 108 mmol/L (ref 101–111)
GFR calc Af Amer: >60 mL/min (ref >60)
GLUCOSE: 124 mg/dL — AB (ref 65–99)
POTASSIUM: 3.8 mmol/L (ref 3.5–5.1)
SODIUM: 144 mmol/L (ref 135–145)

## 2016-12-23 LAB — CBC
HEMATOCRIT: 39.7 % (ref 35.0–47.0)
Hemoglobin: 12.9 g/dL (ref 12.0–16.0)
MCH: 31.8 pg (ref 26.0–34.0)
MCHC: 32.4 g/dL (ref 32.0–36.0)
MCV: 98.1 fL (ref 80.0–100.0)
Platelets: 185 10*3/uL (ref 150–440)
RBC: 4.05 MIL/uL (ref 3.80–5.20)
RDW: 15.4 % — ABNORMAL HIGH (ref 11.5–14.5)
WBC: 7.8 10*3/uL (ref 3.6–11.0)

## 2016-12-23 LAB — ECHOCARDIOGRAM COMPLETE
Height: 65 in
Weight: 2991.2 oz

## 2016-12-23 LAB — TROPONIN I: Troponin I: <0.03 ng/mL (ref <0.03)

## 2016-12-23 MED ORDER — DOCUSATE SODIUM 50 MG/5ML PO LIQD
100.0000 mg | Freq: Two times a day (BID) | ORAL | Status: DC
  Administered 2016-12-23 – 2016-12-24 (×3): 100 mg
  Filled 2016-12-23 (×3): qty 10

## 2016-12-23 MED ORDER — SODIUM CHLORIDE 0.9 % IV BOLUS (SEPSIS)
1000.0000 mL | Freq: Once | INTRAVENOUS | Status: AC
  Administered 2016-12-23: 1000 mL via INTRAVENOUS

## 2016-12-23 MED ORDER — PIPERACILLIN-TAZOBACTAM 3.375 G IVPB
3.3750 g | Freq: Three times a day (TID) | INTRAVENOUS | Status: DC
  Administered 2016-12-23 – 2016-12-24 (×3): 3.375 g via INTRAVENOUS
  Filled 2016-12-23 (×7): qty 50

## 2016-12-23 MED ORDER — SENNOSIDES 8.8 MG/5ML PO SYRP
5.0000 mL | ORAL_SOLUTION | Freq: Two times a day (BID) | ORAL | Status: DC
  Administered 2016-12-23 – 2016-12-24 (×3): 5 mL
  Filled 2016-12-23 (×3): qty 5

## 2016-12-23 MED ORDER — SODIUM CHLORIDE 0.9 % IV SOLN
0.0000 ug/min | INTRAVENOUS | Status: DC
  Administered 2016-12-23: 20 ug/min via INTRAVENOUS
  Filled 2016-12-23 (×2): qty 1

## 2016-12-23 NOTE — Progress Notes (Signed)
Pt's O2 sats < 90% on vent setting 28%, rate 12, Called RT, increased FIO2 to 45%, and rate to 15.  Will call ELink to confirm.  O2 sats now 93%.

## 2016-12-23 NOTE — Progress Notes (Signed)
[place] [facility] [place] NAME: Kaitlin Henderson    MR#:  [id number]  DATE OF BIRTH:  [birth year]  SUBJECTIVE:  CHIEF COMPLAINT:   Chief Complaint  Patient presents with  . Code Sepsis  . Respiratory Distress   - eyes open, following commands on sedation -Went into A. fib RVR last night requiring IV Cardizem and fluid bolus and converted to normal sinus rhythm now. -Unable to be weaned off sedation. Remains on minimal vent settings.  REVIEW OF SYSTEMS:  Review of Systems  Unable to perform ROS: Critical illness    DRUG ALLERGIES:   Allergies  Allergen Reactions  . Codeine Other (See Comments)    Pt states that this medication makes her pass out.   . Levofloxacin Other (See Comments)    Reaction:  Redness   . Sulfa Antibiotics Swelling    VITALS:  Blood pressure (!) 78/48, pulse (!) 59, temperature 99.1 F (37.3 C), temperature source Axillary, resp. rate 15, height 5\' 5"  (1.651 m), weight 84.8 kg (186 lb 15.2 oz), SpO2 97 %.  PHYSICAL EXAMINATION:  Physical Exam  GENERAL:  76 y.o.-year-old patient lying in the bed with no acute distress. Intubated EYES: Pupils equal, round, reactive to light and accommodation. No scleral icterus. Extraocular muscles intact.  HEENT: Head atraumatic, normocephalic. Oropharynx and nasopharynx clear.  NECK:  Supple, no jugular venous distention. No thyroid enlargement, no tenderness.  LUNGS: Normal breath sounds bilaterally, no rales,rhonchi or crepitation. No use of accessory muscles of respiration. Decreased bibasilar breath sounds CARDIOVASCULAR: S1, S2 normal. No murmurs, rubs, or gallops.  ABDOMEN: Soft, nontender, nondistended. Bowel sounds present. No organomegaly or mass.  EXTREMITIES: No pedal edema, cyanosis, or clubbing.  NEUROLOGIC: Eyes open and nodding head appropriately. Following some simple commands. PSYCHIATRIC: The patient is alert, on sedation.  SKIN: No obvious rash, lesion, or  ulcer.    LABORATORY PANEL:   CBC  Recent Labs Lab 12/23/16 0146  WBC 7.8  HGB 12.9  HCT 39.7  PLT 185   ------------------------------------------------------------------------------------------------------------------  Chemistries   Recent Labs Lab 12/30/2016 1316  12/22/16 2012 12/23/16 0146  NA 145  < > 144 144  K 4.3  < > 3.8 3.8  CL 98*  < > 106 108  CO2 38*  < > 32 32  GLUCOSE 158*  < > 144* 124*  BUN 29*  < > 34* 28*  CREATININE 0.89  < > 0.66 0.52  CALCIUM 9.4  < > 7.7* 7.8*  MG  --   < > 1.8  --   AST 19  --   --   --   ALT 15  --   --   --   ALKPHOS 70  --   --   --   BILITOT 0.8  --   --   --   < > = values in this interval not displayed. ------------------------------------------------------------------------------------------------------------------  Cardiac Enzymes  Recent Labs Lab 12/23/16 0753  TROPONINI <0.03   ------------------------------------------------------------------------------------------------------------------  RADIOLOGY:  Dg Chest Port 1 View  Result Date: 12/22/2016 CLINICAL DATA:  Acute onset of dyspnea and atrial fibrillation. EXAM: PORTABLE CHEST 1 VIEW COMPARISON:  0505 hours on the same day FINDINGS: The tip of an endotracheal tube is 3.3 cm above the carina. Enteric tube courses inferior to the left diaphragm. Axillary clips are seen on the right. Stable cardiac and mediastinal contours. Stable mild elevation of right hemidiaphragm. No overt pulmonary edema. No pneumonic consolidation. IMPRESSION:  No active disease. Satisfactory endotracheal tube tip position. Gastric tube extends below the left hemidiaphragm. Electronically Signed   By: Ashley Royalty M.D.   On: 12/22/2016 20:18   Dg Chest Port 1 View  Result Date: 12/22/2016 CLINICAL DATA:  Patient with history of respiratory failure. EXAM: PORTABLE CHEST 1 VIEW COMPARISON:  Chest radiograph 12/21/2016. FINDINGS: ET tube terminates in the mid trachea. Enteric tube courses  inferior to the diaphragm. Monitoring leads overlie the patient. Stable cardiac and mediastinal contours. Elevation right hemidiaphragm. Stable apical pleuroparenchymal thickening. No large area of pulmonary consolidation. Minimal basilar heterogeneous opacities. No pleural effusion pneumothorax. Right axillary surgical clips. IMPRESSION: ET tube mid trachea. Elevation right hemidiaphragm with low lung volumes and basilar opacities favored to represent atelectasis. Electronically Signed   By: Lovey Newcomer M.D.   On: 12/22/2016 07:19    EKG:   Orders placed or performed during the hospital encounter of 12/15/2016  . EKG 12-Lead  . EKG 12-Lead  . EKG 12-Lead  . EKG 12-Lead  . EKG 12-Lead  . EKG 12-Lead  . EKG 12-Lead  . EKG 12-Lead  . EKG 12-Lead  . EKG 12-Lead    ASSESSMENT AND PLAN:   76 year old female with past medical history significant for obesity, CAD, hyper tension, hyperlipidemia, diabetes, depression, chronic respiratory failure secondary to COPD on 3 L home oxygen, history of breast cancer presents to the hospital secondary to worsening respiratory distress.  #1 acute hypoxic respiratory failure-secondary to  healthcare acquired pneumonia. -Appreciate pulmonary intensive care consult. -Patient is intubated and remains on vent. plan to wean As tolerated. Further management per intensive care team. -On  nebs and inhalers. -on Rocephin. Systemic steroids have been discontinued. -Follow up chest x-ray  #2 metabolic encephalopathy-secondary to CO2 retention. -Failed BiPAP, currently intubated and sedated. Monitor closely  #3 diabetes mellitus-currently on sliding scale insulin. Since  tube feeds are started, currently also on Lantus..Sugars are better controlled  #4 depression and anxiety-meds can be restarted once her sedation is decreased. Patient on Zoloft and Klonopin.  #5 DVT prophylaxis-on Lovenox  #6 Hypokalemia-Replaced  PT consult once extubated Recommend  palliative care consultation as patient has end-stage COPD and followed by hospice services.     All the records are reviewed and case discussed with Care Management/Social Workerr. Management plans discussed with the patient, family and they are in agreement.  CODE STATUS: Full Code  TOTAL TIME TAKING CARE OF THIS PATIENT: 37 minutes.   POSSIBLE D/C IN 2-3 DAYS, DEPENDING ON CLINICAL CONDITION.   Gladstone Lighter M.D on 12/23/2016 at 9:03 AM  Between 7am to 6pm - Pager - [phone]  After 6pm go to [email or web address] - password [email or web address] [facility]  Office  [phone]  CC: Primary care physician; No PCP Per Patient

## 2016-12-23 NOTE — Progress Notes (Signed)
*  PRELIMINARY RESULTS* Echocardiogram 2D Echocardiogram has been performed.  Sherrie Sport 12/23/2016, 10:48 AM

## 2016-12-23 NOTE — Progress Notes (Signed)
Neo Synephrine infusion intiated when liter bolus failed to increase SBP to 90. Sedation titrated up d/t pt biting tube and vent alarming.   Writer dropped a full bottle of precedex in room. Cleaned up the floor and cleared glass from small area behind bed.  Will ask ES to sweep.

## 2016-12-23 NOTE — Progress Notes (Signed)
[place] Progress Note Patient Name: Kaitlin Henderson DOB: 1941/05/23 MRN: [id number]   Date of Service  12/23/2016  HPI/Events of Note  Hypoxia - FiO2 increased to 45%. This is covered by the O2 titrate order in the ventilator orders. Respiratory Therapy also increased the PRVC rate to 15.   eICU Interventions  Will order: 1. Increase PRVC rate to 15.  2. ABG at 7:30 PM.     Intervention Category Major Interventions: Hypoxemia - evaluation and management  Lysle Dingwall 12/23/2016, 6:27 PM

## 2016-12-23 NOTE — Plan of Care (Signed)
Problem: Nutritional: Goal: Intake of prescribed amount of daily calories will improve Outcome: Completed/Met Date Met: 12/23/16 Tube feeds tolerated

## 2016-12-23 NOTE — Progress Notes (Signed)
Pharmacy Antibiotic Note/ Constipation Prevention  Kaitlin Henderson is a 76 y.o. female admitted on 12/18/2016 with respiratory failure previously on ceftriaxone. Pharmacy has been consulted for Zosyn dosing for possible aspiration PNA as well as for constipation prevention due to mechanical ventilation requiring continuous sedation and analgesia.   Plan: 1. Zosyn 3.375 g EI q 8 hours.   2. Senna/docusate VT bid.   Height: 5\' 5"  (165.1 cm) Weight: 186 lb 15.2 oz (84.8 kg) IBW/kg (Calculated) : 57  Temp (24hrs), Avg:98.7 F (37.1 C), Min:97.9 F (36.6 C), Max:99.1 F (37.3 C)   Recent Labs Lab  1316 12/23/2016 1614 12/20/16 0027 12/21/16 0449 12/22/16 0433 12/22/16 2012 12/23/16 0146  WBC 11.2*  --  9.6 10.1 8.5 8.5 7.8  CREATININE 0.89  --  0.67 0.66 0.77 0.66 0.52  LATICACIDVEN 1.1 1.2  --   --   --  1.2  --     Estimated Creatinine Clearance: 64.3 mL/min (by C-G formula based on SCr of 0.52 mg/dL).    Allergies  Allergen Reactions  . Codeine Other (See Comments)    Pt states that this medication makes her pass out.   . Levofloxacin Other (See Comments)    Reaction:  Redness   . Sulfa Antibiotics Swelling    Antimicrobials this admission: cefepime 4/19 >> 4/22 vancomycin 4/19 >> 4/20 Ceftriaxone 4/22 >> 4/23 Zosyn 4/23 >>  Dose adjustments this admission:   Microbiology results: 4/19 BCx: NGTD 4/19 UCx: NG  4/123 TA: sent  4/19 MRSA PCR: negative  Thank you for allowing pharmacy to be a part of this patient's care.  Ulice Dash D 12/23/2016 2:26 PM

## 2016-12-23 NOTE — Progress Notes (Addendum)
[facility] Progess Note    SYNOPSIS   76 yo female with chronic debility and end stage COPD, enrolled in hospice, but yet full code, now admitted with AECOPD with pneumonia.   ASSESSMENT/PLAN    PULMONARY A:Acute on chronic hypoxic/hypercapnic respiratory failure AECOPD - wheezing resolved Severe end stage emphysema with severe deconditioning very poor baseline functional status as per my note from 11/05/15 Personally reviewed imaging: Reduced right lung volumes, with basal pleural thickening and basal infiltrates with right hilar/paratracheal lymphadenopathy.  -Failed multiple weaning trials. Intubated 4/19  P:   Cont vent support - settings reviewed and/or adjusted Wean in PSV as tolerated -  Cont vent bundle Will increase abx coverage, if not weaning from vent in next few days will consider goals of care discussions.   VENTILATOR SETTINGS: Vent Mode: PRVC FiO2 (%):  [28 %-100 %] 28 % Set Rate:  [15 bmp] 15 bmp Vt Set:  [500 mL] 500 mL PEEP:  [5 cmH20] 5 cmH20 Plateau Pressure:  [16 cmH20] 16 cmH20  CARDIOVASCULAR A: Hypotension with septic shock.  Afib with RVR-- resolved with amio bolus.  P:  On phenylephrine at 20 mc.  HEMODYNAMICS:    RENAL A:   P:   -- INTAKE / OUTPUT:  Intake/Output Summary (Last 24 hours) at 12/23/16 1348 Last data filed at 12/23/16 1200  Gross per 24 hour  Intake          3392.05 ml  Output             1070 ml  Net          2322.05 ml    GASTROINTESTINAL A: --  HEMATOLOGIC A:  --  INFECTIOUS A:  -- P:    Micro/culture results:  BCx2 4/19; negative.  UC 4/19; negative.  Sputum -- MRSA screen 4/19; negative.   Antibiotics: Ceftriaxine 12/21/16>>  Zosyn 4/23>>  ENDOCRINE A:  --   P:   --  NEUROLOGIC A:  Metabolic encephalopathy.  P:   RASS goal: -1 --   MAJOR EVENTS/TEST RESULTS: 04/19 admitted as above 04/19 CTA chest: No PE. Chronic pleuroparenchymal changes. Minimal patchy  irregular densities  04/20 Wheezing resolved. Failed SBT 04/21 Failed SBT. Weaning in PSV mode initiated 04/22 Again failed SBT and did not tolerate PSV. Full vent support resumed  INDWELLING DEVICES:: ETT 04/19 >>   Best Practices  DVT Prophylaxis: enoxaparin.  GI Prophylaxis: famotidine.    --------------------------------------- Per review of notes, the patient was DNR in past but was then admitted to hospice with full code status.   ----------------------------------------   Name: Kaitlin Henderson MRN: [id number] DOB: 1940-12-12    ADMISSION DATE:  12/28/2016   SUBJECTIVE:   Pt currently on the ventilator, can not provide history or review of systems.   Review of Systems:  --   VITAL SIGNS: Temp:  [97.9 F (36.6 C)-99.1 F (37.3 C)] 98.5 F (36.9 C) (04/23 1200) Pulse Rate:  [59-93] 66 (04/23 1200) Resp:  [15-24] 16 (04/23 1200) BP: (70-128)/(39-71) 128/71 (04/23 1200) SpO2:  [91 %-100 %] 96 % (04/23 1310) FiO2 (%):  [28 %-100 %] 28 % (04/23 1310) Weight:  [186 lb 15.2 oz (84.8 kg)] 186 lb 15.2 oz (84.8 kg) (04/23 0500)     PHYSICAL EXAMINATION: Physical Examination:   VS: BP 128/71 (BP Location: Left Arm)   Pulse 66   Temp 98.5 F (36.9 C) (Axillary)   Resp 16   Ht 5\' 5"  (1.651 m)  Wt 186 lb 15.2 oz (84.8 kg)   SpO2 96%   BMI 31.11 kg/m   General Appearance: No distress  Neuro:without focal findings, mental status reduced.  HEENT: PERRLA, EOM intact. Pulmonary: reduced breath sounds   CardiovascularNormal S1,S2.  No m/r/g.   Abdomen: Benign, Soft, non-tender. Renal:  No costovertebral tenderness  GU:  Not performed at this time. Endocrine: No evident thyromegaly. Skin:   warm, no rashes, no ecchymosis  Extremities: normal, no cyanosis, clubbing.    LABORATORY PANEL:   CBC  Recent Labs Lab 12/23/16 0146  WBC 7.8  HGB 12.9  HCT 39.7  PLT 185    Chemistries   Recent Labs Lab 12/08/2016 1316  12/22/16 2012 12/23/16 0146    NA 145  < > 144 144  K 4.3  < > 3.8 3.8  CL 98*  < > 106 108  CO2 38*  < > 32 32  GLUCOSE 158*  < > 144* 124*  BUN 29*  < > 34* 28*  CREATININE 0.89  < > 0.66 0.52  CALCIUM 9.4  < > 7.7* 7.8*  MG  --   < > 1.8  --   PHOS  --   < > 2.6  --   AST 19  --   --   --   ALT 15  --   --   --   ALKPHOS 70  --   --   --   BILITOT 0.8  --   --   --   < > = values in this interval not displayed.   Recent Labs Lab 12/22/16 1612 12/22/16 2002 12/22/16 2336 12/23/16 0336 12/23/16 0717 12/23/16 1106  GLUCAP 149* 141* 123* 82 108* 194*    Recent Labs Lab 12/23/2016 1529 12/03/2016 1642 12/22/16 1958  PHART 7.16* 7.34* 7.37  PCO2ART 99* 56* 61*  PO2ART 104 71* 289*    Recent Labs Lab 12/21/2016 1316  AST 19  ALT 15  ALKPHOS 70  BILITOT 0.8  ALBUMIN 3.2*    Cardiac Enzymes  Recent Labs Lab 12/23/16 0753  TROPONINI <0.03    RADIOLOGY:  Dg Chest Port 1 View  Result Date: 12/22/2016 CLINICAL DATA:  Acute onset of dyspnea and atrial fibrillation. EXAM: PORTABLE CHEST 1 VIEW COMPARISON:  0505 hours on the same day FINDINGS: The tip of an endotracheal tube is 3.3 cm above the carina. Enteric tube courses inferior to the left diaphragm. Axillary clips are seen on the right. Stable cardiac and mediastinal contours. Stable mild elevation of right hemidiaphragm. No overt pulmonary edema. No pneumonic consolidation. IMPRESSION: No active disease. Satisfactory endotracheal tube tip position. Gastric tube extends below the left hemidiaphragm. Electronically Signed   By: Ashley Royalty M.D.   On: 12/22/2016 20:18   Dg Chest Port 1 View  Result Date: 12/22/2016 CLINICAL DATA:  Patient with history of respiratory failure. EXAM: PORTABLE CHEST 1 VIEW COMPARISON:  Chest radiograph 12/21/2016. FINDINGS: ET tube terminates in the mid trachea. Enteric tube courses inferior to the diaphragm. Monitoring leads overlie the patient. Stable cardiac and mediastinal contours. Elevation right hemidiaphragm.  Stable apical pleuroparenchymal thickening. No large area of pulmonary consolidation. Minimal basilar heterogeneous opacities. No pleural effusion pneumothorax. Right axillary surgical clips. IMPRESSION: ET tube mid trachea. Elevation right hemidiaphragm with low lung volumes and basilar opacities favored to represent atelectasis. Electronically Signed   By: Lovey Newcomer M.D.   On: 12/22/2016 07:19        --Deep Ashby Dawes, MD.  ICU  Pager: [phone] [facility] Office Number: [phone]   12/23/2016    Critical Care Attestation.  I have personally obtained a history, examined the patient, evaluated laboratory and imaging results, formulated the assessment and plan and placed orders. The Patient requires high complexity decision making for assessment and support, frequent evaluation and titration of therapies, application of advanced monitoring technologies and extensive interpretation of multiple databases. The patient has critical illness that could lead imminently to failure of 1 or more organ systems and requires the highest level of physician preparedness to intervene.  Critical Care Time devoted to patient care services described in this note is 45 minutes and is exclusive of time spent in procedures.

## 2016-12-24 ENCOUNTER — Inpatient Hospital Stay

## 2016-12-24 DIAGNOSIS — Z515 Encounter for palliative care: Secondary | ICD-10-CM

## 2016-12-24 DIAGNOSIS — Z66 Do not resuscitate: Secondary | ICD-10-CM

## 2016-12-24 LAB — BLOOD GAS, ARTERIAL
ACID-BASE EXCESS: 4.3 mmol/L — AB (ref 0.0–2.0)
BICARBONATE: 31.8 mmol/L — AB (ref 20.0–28.0)
FIO2: 0.45
MECHVT: 500 mL
O2 Saturation: 93 %
PATIENT TEMPERATURE: 37
PCO2 ART: 59 mmHg — AB (ref 32.0–48.0)
PEEP/CPAP: 5 cmH2O
PH ART: 7.34 — AB (ref 7.350–7.450)
RATE: 15 resp/min
pO2, Arterial: 71 mmHg — ABNORMAL LOW (ref 83.0–108.0)

## 2016-12-24 LAB — CBC
HEMATOCRIT: 43.4 % (ref 35.0–47.0)
Hemoglobin: 14.2 g/dL (ref 12.0–16.0)
MCH: 31.4 pg (ref 26.0–34.0)
MCHC: 32.7 g/dL (ref 32.0–36.0)
MCV: 96 fL (ref 80.0–100.0)
Platelets: 211 10*3/uL (ref 150–440)
RBC: 4.53 MIL/uL (ref 3.80–5.20)
RDW: 15.1 % — ABNORMAL HIGH (ref 11.5–14.5)
WBC: 8.7 10*3/uL (ref 3.6–11.0)

## 2016-12-24 LAB — BASIC METABOLIC PANEL
ANION GAP: 6 (ref 5–15)
BUN: 23 mg/dL — AB (ref 6–20)
CHLORIDE: 105 mmol/L (ref 101–111)
CO2: 32 mmol/L (ref 22–32)
Calcium: 8.1 mg/dL — ABNORMAL LOW (ref 8.9–10.3)
Creatinine, Ser: 0.65 mg/dL (ref 0.44–1.00)
GFR calc Af Amer: >60 mL/min (ref >60)
GFR calc non Af Amer: >60 mL/min (ref >60)
GLUCOSE: 145 mg/dL — AB (ref 65–99)
POTASSIUM: 4.5 mmol/L (ref 3.5–5.1)
Sodium: 143 mmol/L (ref 135–145)

## 2016-12-24 LAB — CULTURE, BLOOD (ROUTINE X 2)
CULTURE: NO GROWTH
CULTURE: NO GROWTH
Special Requests: ADEQUATE
Special Requests: ADEQUATE

## 2016-12-24 LAB — MAGNESIUM: Magnesium: 1.8 mg/dL (ref 1.7–2.4)

## 2016-12-24 LAB — GLUCOSE, CAPILLARY
GLUCOSE-CAPILLARY: 116 mg/dL — AB (ref 65–99)
Glucose-Capillary: 120 mg/dL — ABNORMAL HIGH (ref 65–99)

## 2016-12-24 MED ORDER — SODIUM CHLORIDE 0.9 % IV SOLN: 5.0000 mg/h | INTRAVENOUS | Status: DC

## 2016-12-24 MED ORDER — HALOPERIDOL LACTATE 5 MG/ML IJ SOLN: 0.5000 mg | INTRAMUSCULAR | Status: DC | PRN

## 2016-12-24 MED ORDER — GLYCOPYRROLATE 0.2 MG/ML IJ SOLN
0.2000 mg | INTRAMUSCULAR | Status: DC | PRN
  Administered 2016-12-24 (×2): 0.2 mg via INTRAVENOUS
  Filled 2016-12-24 (×2): qty 1

## 2016-12-24 MED ORDER — ACETAMINOPHEN 650 MG RE SUPP: 650.0000 mg | Freq: Four times a day (QID) | RECTAL | Status: DC | PRN

## 2016-12-24 MED ORDER — ACETAMINOPHEN 325 MG PO TABS: 650.0000 mg | ORAL_TABLET | Freq: Four times a day (QID) | ORAL | Status: DC | PRN

## 2016-12-24 MED ORDER — SENNOSIDES 8.8 MG/5ML PO SYRP: 5.0000 mL | ORAL_SOLUTION | Freq: Two times a day (BID) | ORAL | Status: DC | PRN

## 2016-12-24 MED ORDER — MORPHINE BOLUS VIA INFUSION
4.0000 mg | INTRAVENOUS | Status: DC | PRN
  Administered 2016-12-24 – 2016-12-25 (×9): 4 mg via INTRAVENOUS
  Filled 2016-12-24: qty 4

## 2016-12-24 MED ORDER — HALOPERIDOL 0.5 MG PO TABS
0.5000 mg | ORAL_TABLET | ORAL | Status: DC | PRN
  Filled 2016-12-24: qty 1

## 2016-12-24 MED ORDER — LORAZEPAM 2 MG/ML IJ SOLN
2.0000 mg | INTRAMUSCULAR | Status: DC | PRN
  Administered 2016-12-24 (×2): 2 mg via INTRAVENOUS
  Filled 2016-12-24 (×2): qty 1

## 2016-12-24 MED ORDER — MORPHINE 100MG IN NS 100ML (1MG/ML) PREMIX INFUSION
5.0000 mg/h | INTRAVENOUS | Status: DC
  Administered 2016-12-24 (×2): 5 mg/h via INTRAVENOUS
  Filled 2016-12-24 (×2): qty 100

## 2016-12-24 MED ORDER — ONDANSETRON 4 MG PO TBDP
4.0000 mg | ORAL_TABLET | Freq: Four times a day (QID) | ORAL | Status: DC | PRN
  Filled 2016-12-24: qty 1

## 2016-12-24 MED ORDER — DOCUSATE SODIUM 50 MG/5ML PO LIQD: 100.0000 mg | Freq: Two times a day (BID) | ORAL | Status: DC | PRN

## 2016-12-24 MED ORDER — ALBUTEROL SULFATE (2.5 MG/3ML) 0.083% IN NEBU: 2.5000 mg | INHALATION_SOLUTION | RESPIRATORY_TRACT | Status: DC | PRN

## 2016-12-24 MED ORDER — ONDANSETRON HCL 4 MG/2ML IJ SOLN: 4.0000 mg | Freq: Four times a day (QID) | INTRAMUSCULAR | Status: DC | PRN

## 2016-12-24 MED ORDER — POLYVINYL ALCOHOL 1.4 % OP SOLN
1.0000 [drp] | Freq: Four times a day (QID) | OPHTHALMIC | Status: DC | PRN
  Filled 2016-12-24: qty 15

## 2016-12-24 MED ORDER — HALOPERIDOL LACTATE 2 MG/ML PO CONC
0.5000 mg | ORAL | Status: DC | PRN
  Filled 2016-12-24: qty 0.3

## 2016-12-24 MED ORDER — GLYCOPYRROLATE 1 MG PO TABS: 1.0000 mg | ORAL_TABLET | ORAL | Status: DC | PRN

## 2016-12-24 MED ORDER — GLYCOPYRROLATE 0.2 MG/ML IJ SOLN: 0.2000 mg | INTRAMUSCULAR | Status: DC | PRN

## 2016-12-24 NOTE — Progress Notes (Addendum)
[facility] Progess Note    SYNOPSIS   76 yo female with chronic debility and end stage COPD, enrolled in hospice, but yet full code, now admitted with AECOPD with pneumonia.   ASSESSMENT/PLAN    PULMONARY A:Acute on chronic hypoxic/hypercapnic respiratory failure AECOPD - wheezing resolved Severe end stage emphysema with severe deconditioning very poor baseline functional status as per my note from 11/05/15 Personally reviewed imaging: Reduced right lung volumes, with basal pleural thickening and basal infiltrates with right hilar/paratracheal lymphadenopathy.  -Failed multiple weaning trials. Intubated 4/19 CXR 4/24; personally reviewed, severe emphysema, minimal change from previous.   P:   Cont vent support - settings reviewed and/or adjusted Poor weaning candidate, minimal progress on vent.   Cont vent bundle Will increase abx coverage, if not weaning from vent in next few days will consider goals of care discussions.  Goals of care discussion with POA today.   VENTILATOR SETTINGS: Vent Mode: PRVC FiO2 (%):  [28 %-45 %] 45 % Set Rate:  [12 bmp-15 bmp] 15 bmp Vt Set:  [500 mL] 500 mL PEEP:  [5 cmH20] 5 cmH20  CARDIOVASCULAR A: s/p Hypotension with septic shock.  Afib with RVR-- resolved with amio bolus.  P:  Off phenylephrine HEMODYNAMICS:    RENAL A:   P:   -- INTAKE / OUTPUT:  Intake/Output Summary (Last 24 hours) at 12/15/2016 0856 Last data filed at 12/20/2016 0402  Gross per 24 hour  Intake          1648.28 ml  Output             1240 ml  Net           408.28 ml    GASTROINTESTINAL A: --  HEMATOLOGIC A:  --  INFECTIOUS A:  -- P:    Micro/culture results:  BCx2 4/19; negative.  UC 4/19; negative.  Sputum -- MRSA screen 4/19; negative.   Antibiotics: Ceftriaxine 12/21/16>>  Zosyn 4/23>>  ENDOCRINE A:  --   P:   --  NEUROLOGIC A:  Metabolic encephalopathy.  P:   RASS goal: -1 --   MAJOR EVENTS/TEST  RESULTS: 04/19 admitted as above 04/19 CTA chest: No PE. Chronic pleuroparenchymal changes. Minimal patchy irregular densities  04/20 Wheezing resolved. Failed SBT 04/21 Failed SBT. Weaning in PSV mode initiated 04/22 Again failed SBT and did not tolerate PSV. Full vent support resumed 4/23; did not tolerate brief weaning trial.   INDWELLING DEVICES:: ETT 04/19 >>   Best Practices  DVT Prophylaxis: enoxaparin.  GI Prophylaxis: famotidine.    --------------------------------------- Per review of notes, the patient was DNR in past but was then admitted to hospice with full code status.   ----------------------------------------   Name: Kaitlin Henderson MRN: [id number] DOB: Aug 20, 1941    ADMISSION DATE:  12/19/2016   SUBJECTIVE:   Pt currently on the ventilator, can not provide history or review of systems.   Review of Systems:  --   VITAL SIGNS: Temp:  [98.5 F (36.9 C)-99 F (37.2 C)] 99 F (37.2 C) (04/23 1930) Pulse Rate:  [63-76] 63 (04/24 0500) Resp:  [15-23] 23 (04/24 0500) BP: (95-134)/(46-71) 105/52 (04/24 0500) SpO2:  [91 %-98 %] 94 % (04/24 0500) FiO2 (%):  [28 %-45 %] 45 % (04/24 0312)     PHYSICAL EXAMINATION: Physical Examination:   VS: BP (!) 105/52   Pulse 63   Temp 99 F (37.2 C) (Axillary)   Resp (!) 23   Ht 5\' 5"  (1.651  m)   Wt 186 lb 15.2 oz (84.8 kg)   SpO2 94%   BMI 31.11 kg/m   General Appearance: No distress  Neuro:without focal findings, mental status reduced.  HEENT: PERRLA, EOM intact. Pulmonary: reduced breath sounds   CardiovascularNormal S1,S2.  No m/r/g.   Abdomen: Benign, Soft, non-tender. Renal:  No costovertebral tenderness  GU:  Not performed at this time. Endocrine: No evident thyromegaly. Skin:   warm, no rashes, no ecchymosis  Extremities: normal, no cyanosis, clubbing.    LABORATORY PANEL:   CBC  Recent Labs Lab 12/03/2016 0533  WBC 8.7  HGB 14.2  HCT 43.4  PLT 211    Chemistries   Recent  Labs Lab 12/15/2016 1316  12/22/16 2012  12/23/2016 0533  NA 145  < > 144  < > 143  K 4.3  < > 3.8  < > 4.5  CL 98*  < > 106  < > 105  CO2 38*  < > 32  < > 32  GLUCOSE 158*  < > 144*  < > 145*  BUN 29*  < > 34*  < > 23*  CREATININE 0.89  < > 0.66  < > 0.65  CALCIUM 9.4  < > 7.7*  < > 8.1*  MG  --   < > 1.8  --  1.8  PHOS  --   < > 2.6  --   --   AST 19  --   --   --   --   ALT 15  --   --   --   --   ALKPHOS 70  --   --   --   --   BILITOT 0.8  --   --   --   --   < > = values in this interval not displayed.   Recent Labs Lab 12/23/16 1106 12/23/16 1536 12/23/16 2002 12/23/16 2353 12/23/2016 0348 12/23/2016 0728  GLUCAP 194* 110* 163* 155* 116* 120*    Recent Labs Lab 12/22/16 1958 12/23/16 1920 12/29/2016 0402  PHART 7.37 7.38 7.34*  PCO2ART 61* 53* 59*  PO2ART 289* 66* 71*    Recent Labs Lab 12/08/2016 1316  AST 19  ALT 15  ALKPHOS 70  BILITOT 0.8  ALBUMIN 3.2*    Cardiac Enzymes  Recent Labs Lab 12/23/16 0753  TROPONINI <0.03    RADIOLOGY:  Dg Chest 1 View  Result Date: 12/18/2016 CLINICAL DATA:  Shortness of breath. EXAM: CHEST 1 VIEW COMPARISON:  12/22/2016. FINDINGS: Endotracheal tube and NG tube in stable position. Atelectasis right lower lobe. Small right pleural effusion. Stable right apical pleural thickening. Persistent elevation right hemidiaphragm. No pneumothorax. Stable cardiomegaly. Surgical clips right axilla. IMPRESSION: 1. Lines and tubes in stable position. 2. Atelectasis right lower lobe with small right pleural effusion. Persistent elevation right hemidiaphragm. Electronically Signed   By: Marcello Moores  Register   On:  06:57   Dg Chest Port 1 View  Result Date: 12/22/2016 CLINICAL DATA:  Acute onset of dyspnea and atrial fibrillation. EXAM: PORTABLE CHEST 1 VIEW COMPARISON:  0505 hours on the same day FINDINGS: The tip of an endotracheal tube is 3.3 cm above the carina. Enteric tube courses inferior to the left diaphragm. Axillary  clips are seen on the right. Stable cardiac and mediastinal contours. Stable mild elevation of right hemidiaphragm. No overt pulmonary edema. No pneumonic consolidation. IMPRESSION: No active disease. Satisfactory endotracheal tube tip position. Gastric tube extends below the left hemidiaphragm. Electronically Signed  By: Ashley Royalty M.D.   On: 12/22/2016 20:18        --Marda Stalker, MD.  ICU Pager: [phone] [facility] Office Number: [phone]   12/20/2016    Critical Care Attestation.  I have personally obtained a history, examined the patient, evaluated laboratory and imaging results, formulated the assessment and plan and placed orders. The Patient requires high complexity decision making for assessment and support, frequent evaluation and titration of therapies, application of advanced monitoring technologies and extensive interpretation of multiple databases. The patient has critical illness that could lead imminently to failure of 1 or more organ systems and requires the highest level of physician preparedness to intervene.  Critical Care Time devoted to patient care services described in this note is 35 minutes and is exclusive of time spent in procedures.

## 2016-12-24 NOTE — Progress Notes (Signed)
Dr [name] and pt's POA [name] spoke at length re patient's wishes for her care.  Patient nodded her head vigorously when POA asked if she was "ready to see Jesus."  Per [name] she did not wish for artificial nutrition and would not have wanted to be intubated.  Per Dr [name] we will honor her wishes and keep her comfortable. [name] verbalized agreement with this plan and reiterated he wanted patient to be comfortable.  He does not wish to be here when she is extubated.    Will extubate after starting Morphine drip and comfort care measures.

## 2016-12-24 NOTE — Progress Notes (Signed)
Visit made. Patient extubated to 5 liters of oxygen via nasal cannula prior to visit. Her friend and former neighbor [name] present and brought copies of her living will. Patient seen lying in bed, alert and attempting to talk with Probation officer. Her voice was very soft and difficult to hear. Coughing noted along with increased respiratory rate and work of breathing. Staff RN Lovena Le notified, Palliative Medicine NP Ihor Dow present and placed new orders. PRN lorazepam given by staff RN Lovena Le, patient appeared more comfortable. Emotional support given to [facility]. Hospice team updated. Will continue to follow. Thank you. [name] RN, BSN, [facility] [place], hospital liaison [phone] c

## 2016-12-24 NOTE — Progress Notes (Signed)
Small amount emesis s/p extubation, appeared to be tube feeding

## 2016-12-24 NOTE — Consult Note (Signed)
Consultation Note Date: 12/13/2016   Patient Name: Kaitlin Henderson  DOB: 05/16/1941  MRN: [id number]  Age / Sex: 76 y.o., female  PCP: No Pcp Per Patient Referring Physician: Gladstone Lighter, MD  Reason for Consultation: Establishing goals of care and Terminal Care  HPI/Patient Profile: 76 y.o. female  with past medical history of COPD, chronic respiratory failure on home oxygen, CHF, CAD, depression, DM, GERD, HLD, hypothyroidism, NSTEMI, obesity, and breast cancer admitted on 12/02/2016 with respiratory distress and oxygen saturations 77% on 5L. In ED, patient initially placed on BiPAP. Increased lethargy and intubated. Multiple failed attempts at weaning from ventilator. PCCM discussed with POA on 4/24 and patient was extubated with focus on comfort measures only. Palliative medicine consultation for goals of care.   Clinical Assessment and Goals of Care: I have reviewed medical records, discussed with care team, and assessed the patient at bedside. Recently extubated. RR 35-40 on 5L [place]. Accessory muscle usage. During my time spent with patient, she received two 53m morphine bolus via infusion and ativan 264mIV until comfort was reached. Prior to leaving, the patient is resting comfortably. RR 18.   Met with neighbor/POA, Kaitlin Henderson to discuss diagnosis, prognosis, GOC, EOL wishes, disposition and options.  Introduced Palliative Medicine as specialized medical care for people living with serious illness. It focuses on providing relief from the symptoms and stress of a serious illness. The goal is to improve quality of life for both the patient and the family.  We discussed a brief life review of the patient. 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 Husband is now deceased. She does not speak to her children. Neighbors, RiLiliane Channelnd Kaitlin Worthhave known her for many years and very involved/supportive. They still look  over her farm. She has greatly deconditioned from COPD and been on home oxygen for many years.    Discussed hospital diagnoses and interventions. RiLiliane Channels POA and she has a living will. The patient and RiLiliane Channelre very clear on wanting comfort measures only. He confirms she would not want her life prolonged if she would not have a quality of life. DNR now. Patient extubated with no plans of re-intubation if she should decline. RiLiliane Channelnderstands poor prognosis and anything could happen at any time. He reiterates focus on comfort and not allowing her to suffer in any way.   Educated on comfort measures and focus on symptom management. Briefly discussed hospice services if she survives the night or stable for discharge.   Questions and concerns were addressed. RiLiliane Channelpeaks of experiences with death from his parents. States "it's never easy." Emotional and spiritual support provided.   SUMMARY OF RECOMMENDATIONS    DNR/DNI  Comfort measures only. Discontinued labs/medications/interventions not aimed at comfort.   Symptom management--see below. Treat dyspnea/air hunger with morphine boluses not escalating oxygen.   Oral care as needed.   Anticipate hospital death. Will further discuss hospice services with POA if appropriate.   Code Status/Advance Care Planning:  DNR   Symptom Management:  Morphine 1m/hr continuous infusion  Morphine bolus via infusion [id number] pain/dyspnea/air hunger  Ativan 2mg79m q4h prn anxiety  Robinul 0.2mg 50mq4h prn secretions  Palliative Prophylaxis:   Aspiration, Delirium Protocol, Frequent Pain Assessment, Oral Care and Turn Reposition  Additional Recommendations (Limitations, Scope, Preferences):  Full Comfort Care  Psycho-social/Spiritual:   Desire for further Chaplaincy support:yes  Additional Recommendations: Caregiving  Support/Resources, Compassionate Wean Education and Education on Hospice  Prognosis:   Hours - Days  Discharge  Planning: To Be Determined likely hospital death     Primary Diagnoses: Present on Admission: . Acute on chronic respiratory failure with hypoxia and hypercapnia (HCC)   I have reviewed the medical record, interviewed the patient and family, and examined the patient. The following aspects are pertinent.  Past Medical History:  Diagnosis Date  . Anginal pain (HCC) [place] Aphthous stomatitis   . APHTHOUS STOMATITIS   . Breast cancer, right breast (HCC) [place]8   s/p chemo & XRT; right mastectomy  . Breast cancer, right breast (HCC) [place]8   s/p chemo & XRT, right mastectomy  . C O P D    chronic O2 3LPM [place]  . CHF (congestive heart failure) (HCC) [place] COPD (chronic obstructive pulmonary disease) (HCC) [place] Coronary artery disease   . CORONARY ARTERY DISEASE   . Depression   . DEPRESSION    started sertraline 09/2010  . Diabetes mellitus, type 2 (HCC) [place]011  . DIABETES, TYPE 2 dx 03/2010  . Essential tremor   . GERD (gastroesophageal reflux disease)   . Hyperlipemia   . HYPERLIPIDEMIA   . Hypothyroidism   . NSTEMI (non-ST elevated myocardial infarction) (HCC) [place]013  . NSTEMI (non-ST elevated myocardial infarction) (HCC) [place]013   med mgmt  . Obesity   . OBESITY    Social History   Social History  . Marital status: Widowed    Spouse name: N/A  . Number of children: N/A  . Years of education: N/A   Social History Main Topics  . Smoking status: Former Smoker    Packs/day: 2.50    Years: 54.00    Types: Cigarettes    Quit date: 09/02/1997  . Smokeless tobacco: Never Used     Comment: Retired. Widow/widower since 2008-lives alone. Home hospice since 06/2009 related to COPD  . Alcohol use No  . Drug use: No  . Sexual activity: Not Asked   Other Topics Concern  . None   Social History Narrative   ** Merged History Encounter **       ** Data from: 12/12/15 Enc Dept: [facility]  ** Data from: 11/15/15 Enc Dept: [facility]   Retired. Widow since  2008, now lives alone.   Home Hospice since 06/2009 related to COPD.   Family History  Problem Relation Age of Onset  . Diabetes Neg Hx   . Cancer Neg Hx    Scheduled Meds: . budesonide  0.25 mg Nebulization Q6H  . chlorhexidine gluconate (MEDLINE KIT)  15 mL Mouth Rinse BID  . ipratropium-albuterol  3 mL Nebulization Q6H  . mouth rinse  15 mL Mouth Rinse QID  . sodium chloride flush  3 mL Intravenous Q12H   Continuous Infusions: . morphine 5 mg/hr (12/12/2016 1200)   PRN Meds:.acetaminophen **OR** acetaminophen, albuterol, sennosides **AND** docusate, glycopyrrolate **OR** glycopyrrolate **OR** glycopyrrolate, haloperidol **OR** haloperidol **OR** haloperidol lactate, LORazepam, morphine, [DISCONTINUED] ondansetron **OR** ondansetron (ZOFRAN) IV, ondansetron **OR** ondansetron (  ZOFRAN) IV, polyvinyl alcohol Medications Prior to Admission:  Prior to Admission medications   Medication Sig Start Date End Date Taking? Authorizing Provider  ALPRAZolam (XANAX) 0.25 MG tablet Take 0.25 mg by mouth 2 (two) times daily.   Yes Historical Provider, MD  aspirin 325 MG tablet Take 325 mg by mouth 2 (two) times daily.    Yes Historical Provider, MD  budesonide (PULMICORT) 0.5 MG/2ML nebulizer solution Take 0.5 mg by nebulization 2 (two) times daily.   Yes Historical Provider, MD  clonazePAM (KLONOPIN) 0.5 MG tablet Take 0.5 mg by mouth at bedtime.   Yes Historical Provider, MD  insulin aspart (NOVOLOG) 100 UNIT/ML injection Inject 0-20 Units into the skin 3 (three) times daily with meals. 05/09/16  Yes Fritzi Mandes, MD  insulin detemir (LEVEMIR) 100 UNIT/ML injection Inject 0.25 mLs (25 Units total) into the skin at bedtime. Patient taking differently: Inject 28 Units into the skin at bedtime.  05/09/16  Yes Fritzi Mandes, MD  ipratropium-albuterol (DUONEB) 0.5-2.5 (3) MG/3ML SOLN Take 3 mLs by nebulization every 6 (six) hours.   Yes Historical Provider, MD  polyethylene glycol (MIRALAX / GLYCOLAX) packet  Take 17 g by mouth daily as needed for mild constipation. 05/09/16  Yes Fritzi Mandes, MD  ranitidine (ZANTAC) 150 MG tablet Take 150 mg by mouth 2 (two) times daily.   Yes Historical Provider, MD  sertraline (ZOLOFT) 50 MG tablet Take 50 mg by mouth daily.   Yes Historical Provider, MD  atorvastatin (LIPITOR) 40 MG tablet Take 1 tablet (40 mg total) by mouth at bedtime. Patient not taking: Reported on 07/16/2016 05/09/16   Fritzi Mandes, MD  Ipratropium-Albuterol (COMBIVENT) 20-100 MCG/ACT AERS respimat Inhale 1 puff into the lungs every 6 (six) hours as needed for wheezing. Patient not taking: Reported on 07/16/2016 05/09/16   Fritzi Mandes, MD  lidocaine-prilocaine (EMLA) cream Apply 1 application topically as needed. Patient not taking: Reported on 07/16/2016 06/02/16   Theodoro Grist, MD  tiotropium (SPIRIVA) 18 MCG inhalation capsule Place 1 capsule (18 mcg total) into inhaler and inhale daily. Patient not taking: Reported on 07/16/2016 05/09/16   Fritzi Mandes, MD   Allergies  Allergen Reactions  . Codeine Other (See Comments)    Pt states that this medication makes her pass out.   . Levofloxacin Other (See Comments)    Reaction:  Redness   . Sulfa Antibiotics Swelling   Review of Systems  Unable to perform ROS: Acuity of condition   Physical Exam  Constitutional: She is easily aroused. She appears ill. She appears distressed.  HENT:  Head: Normocephalic and atraumatic.  Cardiovascular: Regular rhythm and normal heart sounds.   Pulmonary/Chest: Accessory muscle usage present. Tachypnea noted. She has decreased breath sounds.  RN to give morphine bolus  Abdominal: Normal appearance and bowel sounds are normal.  Neurological: She is alert and easily aroused.  Answers questions  Skin: Skin is warm and dry. There is pallor.  Psychiatric: Her mood appears anxious. Her speech is delayed.  Nursing note and vitals reviewed.  Vital Signs: BP (!) 141/125 (BP Location: Left Arm)   Pulse 67   Temp 98.1  F (36.7 C) (Axillary)   Resp 20   Ht '5\' 5"'  (1.651 m)   Wt 84.8 kg (186 lb 15.2 oz)   SpO2 92%   BMI 31.11 kg/m  Pain Assessment: CPOT POSS *See Group Information*: 2-Acceptable,Slightly drowsy, easily aroused Pain Score: 0-No pain  SpO2: SpO2: 92 % O2 Device:SpO2: 92 % O2 Flow Rate: .  O2 Flow Rate (L/min): 5 L/min  IO: Intake/output summary:   Intake/Output Summary (Last 24 hours) at 12/04/2016 1455 Last data filed at 12/14/2016 1200  Gross per 24 hour  Intake          3001.46 ml  Output             1500 ml  Net          1501.46 ml    LBM: Last BM Date:  (unknown) Baseline Weight: Weight: 90.7 kg (200 lb) Most recent weight: Weight: 84.8 kg (186 lb 15.2 oz)     Palliative Assessment/Data: PPS 20%   Flowsheet Rows     Most Recent Value  Intake Tab  Referral Department  Hospitalist  Unit at Time of Referral  ICU  Palliative Care Primary Diagnosis  Pulmonary  Palliative Care Type  New Palliative care  Reason for referral  Clarify Goals of Care, End of Life Care Assistance  Date first seen by Palliative Care  12/15/2016  Clinical Assessment  Palliative Performance Scale Score  20%  Psychosocial & Spiritual Assessment  Palliative Care Outcomes  Patient/Family meeting held?  Yes  Who was at the meeting?  patient and neighbor Comptroller)  Palliative Care Outcomes  Clarified goals of care, Improved pain interventions, Improved non-pain symptom therapy, Provided end of life care assistance, ACP counseling assistance, Provided psychosocial or spiritual support, Changed to focus on comfort      Time In: 1300 Time Out: 1415 Time Total: 41mn Greater than 50%  of this time was spent counseling and coordinating care related to the above assessment and plan.  Signed by:  MIhor Dow FNP-C Palliative Medicine Team  Phone: [phone]: [phone]  Please contact Palliative Medicine Team phone at [phone] questions and concerns.  For individual provider: See  AShea Evans

## 2016-12-24 NOTE — Progress Notes (Signed)
Pt. Was suctioned prior to extubation for a small amount of thick seretions. Per Dr. Enid Derry order, she was extubated and placed on comfort care. She is fully alert and coughing. I placed her on 5 L nasal cannula. Her Sa02 are 92%

## 2016-12-24 NOTE — Progress Notes (Signed)
Pt resting quietly, O2 sats 85-92% on 5 liters, morphine gtt infusing, POA left room to go home, stated he will be back shortly.  Pt has no s/sx of distress at this time

## 2016-12-24 NOTE — Progress Notes (Signed)
[place] [facility] [place] NAME: Kaitlin Henderson    MR#:  [id number]  DATE OF BIRTH:  26-Mar-1941  SUBJECTIVE:  CHIEF COMPLAINT:   Chief Complaint  Patient presents with  . Code Sepsis  . Respiratory Distress   - extubated to 5L nasal cannula. Comfort care now - appears comfortable, on low dose morphine drip  REVIEW OF SYSTEMS:  Review of Systems  Unable to perform ROS: Critical illness    DRUG ALLERGIES:   Allergies  Allergen Reactions  . Codeine Other (See Comments)    Pt states that this medication makes her pass out.   . Levofloxacin Other (See Comments)    Reaction:  Redness   . Sulfa Antibiotics Swelling    VITALS:  Blood pressure (!) 141/125, pulse 67, temperature 98.1 F (36.7 C), temperature source Axillary, resp. rate 20, height 5\' 5"  (1.651 m), weight 84.8 kg (186 lb 15.2 oz), SpO2 92 %.  PHYSICAL EXAMINATION:  Physical Exam  GENERAL:  76 y.o.-year-old patient lying in the bed, extubated, appears comfortable EYES: Pupils equal, round, reactive to light and accommodation. No scleral icterus. Extraocular muscles intact.  HEENT: Head atraumatic, normocephalic. Oropharynx and nasopharynx clear.  NECK:  Supple, no jugular venous distention. No thyroid enlargement, no tenderness.  LUNGS: Normal breath sounds bilaterally, no rales,rhonchi or crepitation. No use of accessory muscles of respiration. Decreased bibasilar breath sounds CARDIOVASCULAR: S1, S2 normal. No murmurs, rubs, or gallops.  ABDOMEN: Soft, nontender, nondistended. Bowel sounds present. No organomegaly or mass.  EXTREMITIES: No pedal edema, cyanosis, or clubbing.  NEUROLOGIC: Extubated, comfortable. Sedated so not responding at this time. PSYCHIATRIC: The patient is sedated SKIN: No obvious rash, lesion, or ulcer.    LABORATORY PANEL:   CBC  Recent Labs Lab 12/26/2016 0533  WBC 8.7  HGB 14.2  HCT 43.4  PLT 211    ------------------------------------------------------------------------------------------------------------------  Chemistries   Recent Labs Lab 12/20/2016 1316  12/10/2016 0533  NA 145  < > 143  K 4.3  < > 4.5  CL 98*  < > 105  CO2 38*  < > 32  GLUCOSE 158*  < > 145*  BUN 29*  < > 23*  CREATININE 0.89  < > 0.65  CALCIUM 9.4  < > 8.1*  MG  --   < > 1.8  AST 19  --   --   ALT 15  --   --   ALKPHOS 70  --   --   BILITOT 0.8  --   --   < > = values in this interval not displayed. ------------------------------------------------------------------------------------------------------------------  Cardiac Enzymes  Recent Labs Lab 12/23/16 0753  TROPONINI <0.03   ------------------------------------------------------------------------------------------------------------------  RADIOLOGY:  Dg Chest 1 View  Result Date: 12/17/2016 CLINICAL DATA:  Shortness of breath. EXAM: CHEST 1 VIEW COMPARISON:  12/22/2016. FINDINGS: Endotracheal tube and NG tube in stable position. Atelectasis right lower lobe. Small right pleural effusion. Stable right apical pleural thickening. Persistent elevation right hemidiaphragm. No pneumothorax. Stable cardiomegaly. Surgical clips right axilla. IMPRESSION: 1. Lines and tubes in stable position. 2. Atelectasis right lower lobe with small right pleural effusion. Persistent elevation right hemidiaphragm. Electronically Signed   By: Marcello Moores  Register   On:  06:57   Dg Chest Port 1 View  Result Date: 12/22/2016 CLINICAL DATA:  Acute onset of dyspnea and atrial fibrillation. EXAM: PORTABLE CHEST 1 VIEW COMPARISON:  0505 hours on the same day FINDINGS: The tip of an endotracheal tube is  3.3 cm above the carina. Enteric tube courses inferior to the left diaphragm. Axillary clips are seen on the right. Stable cardiac and mediastinal contours. Stable mild elevation of right hemidiaphragm. No overt pulmonary edema. No pneumonic consolidation. IMPRESSION: No  active disease. Satisfactory endotracheal tube tip position. Gastric tube extends below the left hemidiaphragm. Electronically Signed   By: Ashley Royalty M.D.   On: 12/22/2016 20:18    EKG:   Orders placed or performed during the hospital encounter of 12/23/2016  . EKG 12-Lead  . EKG 12-Lead  . EKG 12-Lead  . EKG 12-Lead  . EKG 12-Lead  . EKG 12-Lead  . EKG 12-Lead  . EKG 12-Lead  . EKG 12-Lead  . EKG 12-Lead    ASSESSMENT AND PLAN:   76 year old female with past medical history significant for obesity, CAD, hyper tension, hyperlipidemia, diabetes, depression, chronic respiratory failure secondary to COPD on 3 L home oxygen, history of breast cancer presents to the hospital secondary to worsening respiratory distress.  #1 acute hypoxic respiratory failure-secondary to  healthcare acquired pneumonia. -Appreciate pulmonary intensive care consult. -Patient was intubated, due to end stage COPD- was difficult wean and to extubate. Has been on vent for >4 days - received steroids and antibiotics - appreciate pulmonary consult  #2 metabolic encephalopathy-secondary to CO2 retention. -Failed BiPAP, intubated on 12/10/2016 and terminal extubation on 12/17/2016  #3 diabetes mellitus- off meds now due to comfort care  #4 depression and anxiety-as needed meds for anxiety  Comfort care only now. Terminal extubation today. Due to tachypnea and respiratory distress, started on low-dose morphine drip. Also has when necessary morphine ordered and Ativan for anxiety. Comfort meds only. Discussed with [name] who is patient's power of attorney at bedside. He is comfortable with the plan. Will transfer to oncology floor. Evaluate for hospice home tomorrow if needed.     All the records are reviewed and case discussed with Care Management/Social Workerr. Management plans discussed with the patient, family and they are in agreement.  CODE STATUS: Full Code  TOTAL TIME TAKING CARE OF THIS  PATIENT: 37 minutes.   POSSIBLE D/C IN 2-3 DAYS, DEPENDING ON CLINICAL CONDITION.   Gladstone Lighter M.D on 12/30/2016 at 3:11 PM  Between 7am to 6pm - Pager - [phone]  After 6pm go to [email or web address] - password [email or web address] [place]  Office  [phone]  CC: Primary care physician; No PCP Per Patient

## 2016-12-24 NOTE — Progress Notes (Signed)
Chaplain responded to an end of life order. Patient was being visited by a friend. Chaplain asked patient's friend if she had a family nearby and he said that she had two children, but they do not get along. Chaplain prayed for patient and left.   12/28/2016 1900  Clinical Encounter Type  Visited With Patient  Visit Type Initial  Referral From Nurse  Consult/Referral To Chaplain  Spiritual Encounters  Spiritual Needs Prayer

## 2016-12-25 LAB — CULTURE, RESPIRATORY W GRAM STAIN: Culture: NO GROWTH

## 2016-12-25 LAB — CULTURE, RESPIRATORY

## 2016-12-25 MED ORDER — MORPHINE BOLUS VIA INFUSION
6.0000 mg | INTRAVENOUS | Status: DC | PRN
  Administered 2016-12-25: 6 mg via INTRAVENOUS
  Filled 2016-12-25: qty 6

## 2016-12-31 IMAGING — DX DG CHEST 1V
1 series · 1 of 1 positions shown · non-contrast
Comparison: November 03, 2015.

CLINICAL DATA: Increased shortness of breath, cough.

EXAM:
CHEST 1 VIEW

[chest ap]
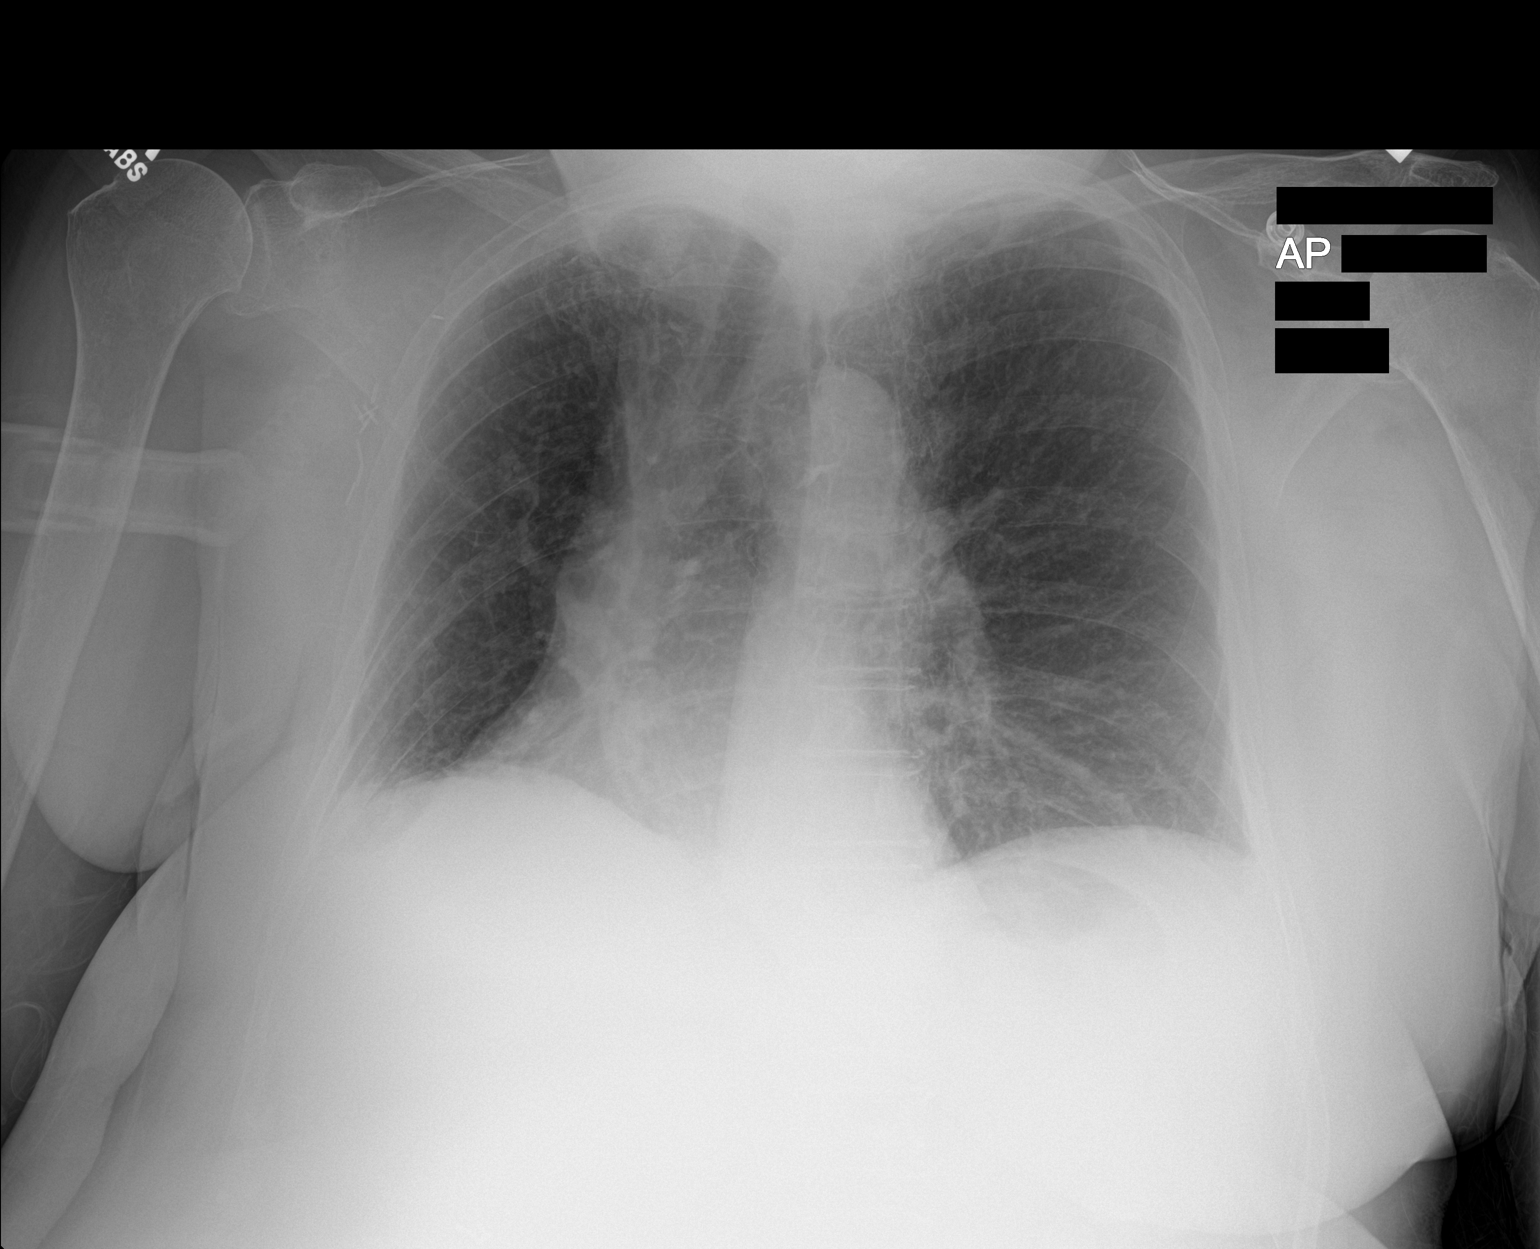

[1 of 1 positions shown; findings below may reference images not displayed]

FINDINGS: The heart size and mediastinal contours are within normal limits.
Both lungs are clear. Right axillary surgical clips are noted. No
pneumothorax or pleural effusion is noted. The visualized skeletal
structures are unremarkable.
IMPRESSION: No acute cardiopulmonary abnormality seen.

## 2016-12-31 NOTE — Progress Notes (Addendum)
Pt expired at [place].  Verified by this RN and Theodoro Kos.  Called nursing supervisor, Dr Marcille Blanco, and [facility].  Pt not a candidate for donation.  Left message with DPOA [name] at [phone].  Pt had jewelry at bedside.  Placed in plastic bag and called security.  They will keep until claimed by [facility] home or DPOA.  Body prepared and ready for transport.  Dorna Bloom RN 0500 Left message for friend Burman Freestone at [phone] that pt had passed away. Dorna Bloom RN

## 2016-12-31 NOTE — Death Summary Note (Signed)
   DEATH SUMMARY   Patient Details  Name: [name] MRN: [id number] DOB: 1941-08-04  Admission/Discharge Information   Admit Date:  December 26, 2016  Date of Death:    Time of Death:    Length of Stay: 5  Referring Physician: No PCP Per Patient   Reason(s) for Hospitalization  Respiratory failure.   Diagnoses  Preliminary cause of death:  Secondary Diagnoses (including complications and co-morbidities):  Active Problems:   Acute on chronic respiratory failure with hypoxia and hypercapnia (HCC)   Acute metabolic encephalopathy   Multifocal pneumonia   Elevated troponin   Leukocytosis   Brief Hospital Course (including significant findings, care, treatment, and services provided and events leading to death)  Kaitlin Henderson is a 76 y.o. year old female who was admitted with respiratory failure, she was intubated. She had been under hospice care but was full code, she had end stage emphysema.  She was intubated for pneumonia, treated with antibiotics, but made no progress towards weaning from the ventilator.  Case was discussed with her neighbor, POA, who felt that the patient would not want to be maintained on life support. Therefore the patient was change to CMO, and extubated.      Pertinent Labs and Studies  Significant Diagnostic Studies   Procedures/Operations  --   Laverle Hobby 12/17/2016, 11:22 AM

## 2016-12-31 DEATH — deceased

## 2017-01-01 IMAGING — DX DG CHEST 1V PORT
1 series · 1 of 1 positions shown · non-contrast
Comparison: 11/06/2015

CLINICAL DATA: PICC line placement

EXAM:
PORTABLE CHEST 1 VIEW

[chest ap]
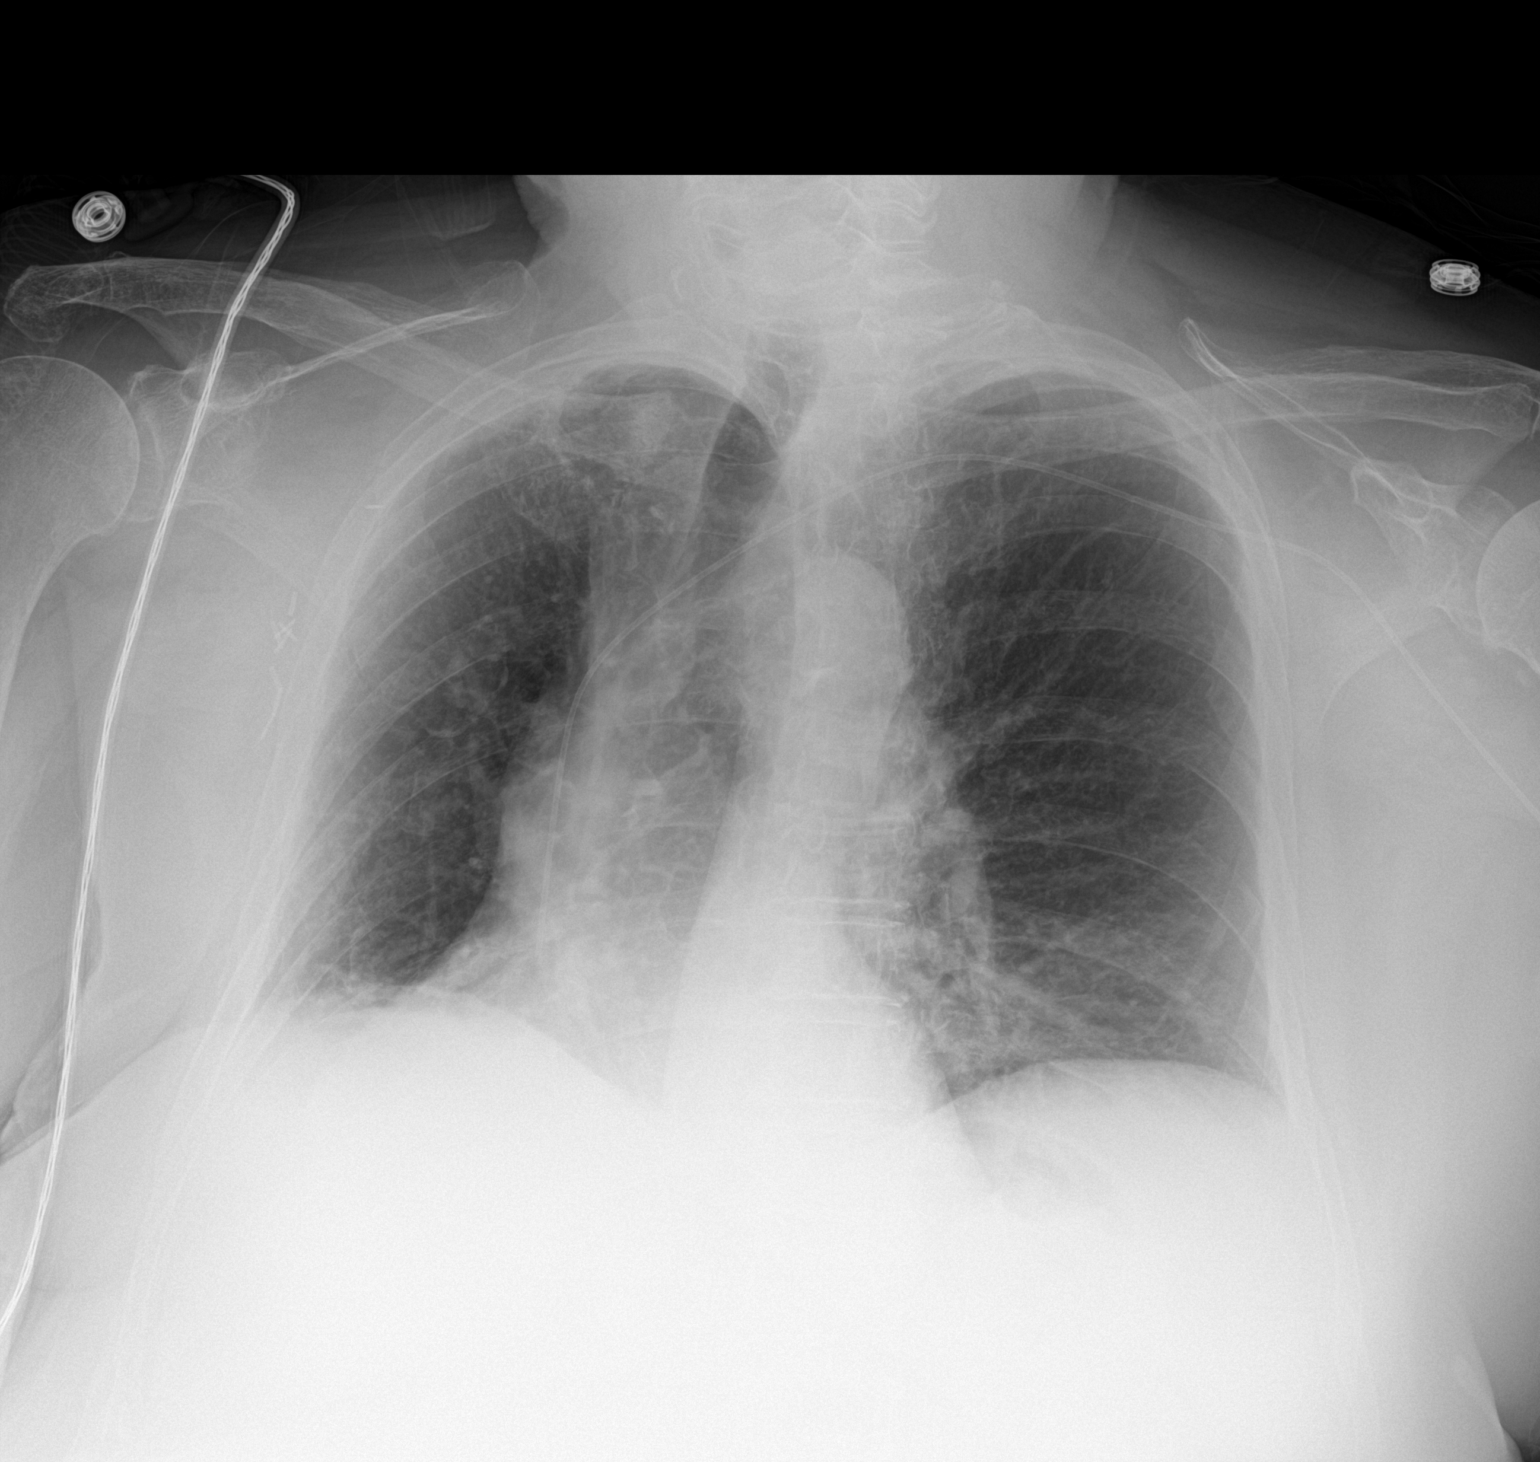

[1 of 1 positions shown; findings below may reference images not displayed]

FINDINGS: There is a right-sided PICC line with the tip projecting over the
cavoatrial junction. There is no focal parenchymal opacity. There is
no pleural effusion or pneumothorax. The heart and mediastinal
contours are unremarkable.

The osseous structures are unremarkable.
IMPRESSION: 1. Right-sided PICC line with the tip projecting over the cavoatrial
junction.

## 2018-02-16 IMAGING — DX DG CHEST 1V PORT
1 series · 1 of 1 positions shown · non-contrast
Comparison: Chest radiograph 12/21/2016.

CLINICAL DATA: Patient with history of respiratory failure.

EXAM:
PORTABLE CHEST 1 VIEW

[chest ap]
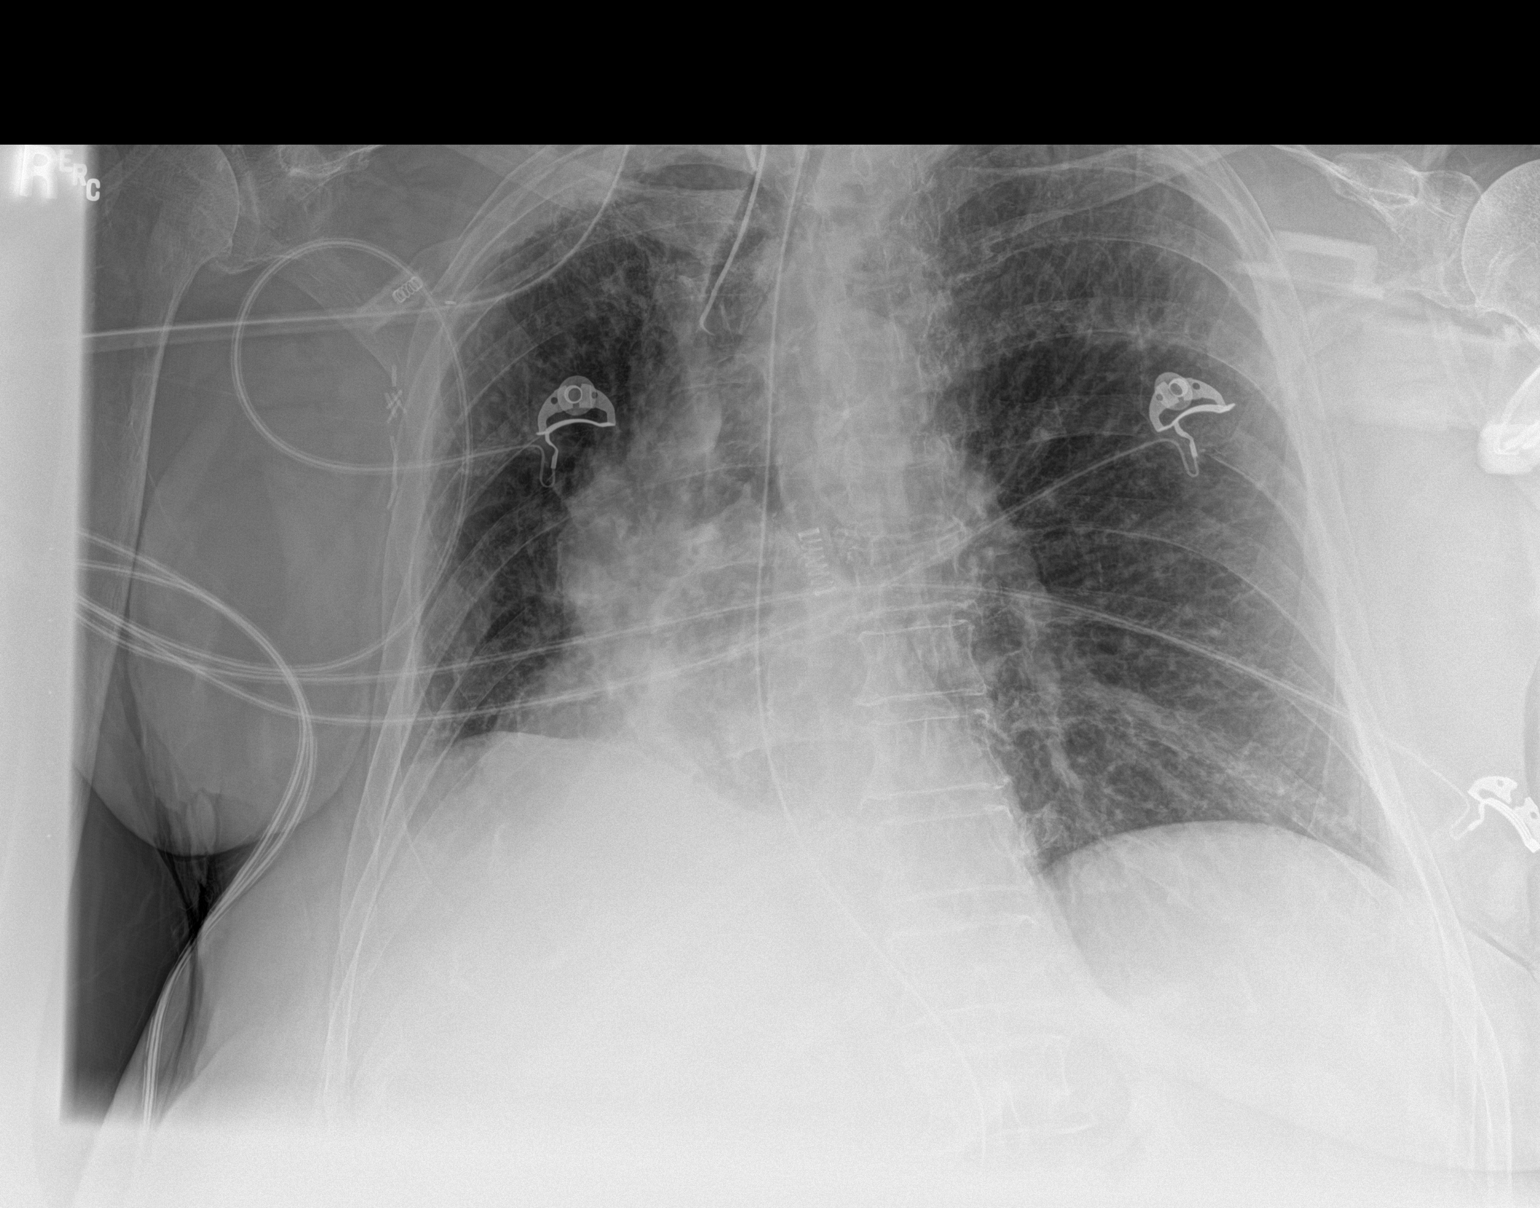

[1 of 1 positions shown; findings below may reference images not displayed]

FINDINGS: ET tube terminates in the mid trachea. Enteric tube courses inferior
to the diaphragm. Monitoring leads overlie the patient. Stable
cardiac and mediastinal contours. Elevation right hemidiaphragm.
Stable apical pleuroparenchymal thickening. No large area of
pulmonary consolidation. Minimal basilar heterogeneous opacities. No
pleural effusion pneumothorax. Right axillary surgical clips.
IMPRESSION: ET tube mid trachea.

Elevation right hemidiaphragm with low lung volumes and basilar
opacities favored to represent atelectasis.

## 2018-02-16 IMAGING — DX DG CHEST 1V PORT
2 series · 2 of 2 positions shown · non-contrast
Comparison: 3333 hours on the same day

CLINICAL DATA: Acute onset of dyspnea and atrial fibrillation.

EXAM:
PORTABLE CHEST 1 VIEW

[chest ap (1 of 2)]
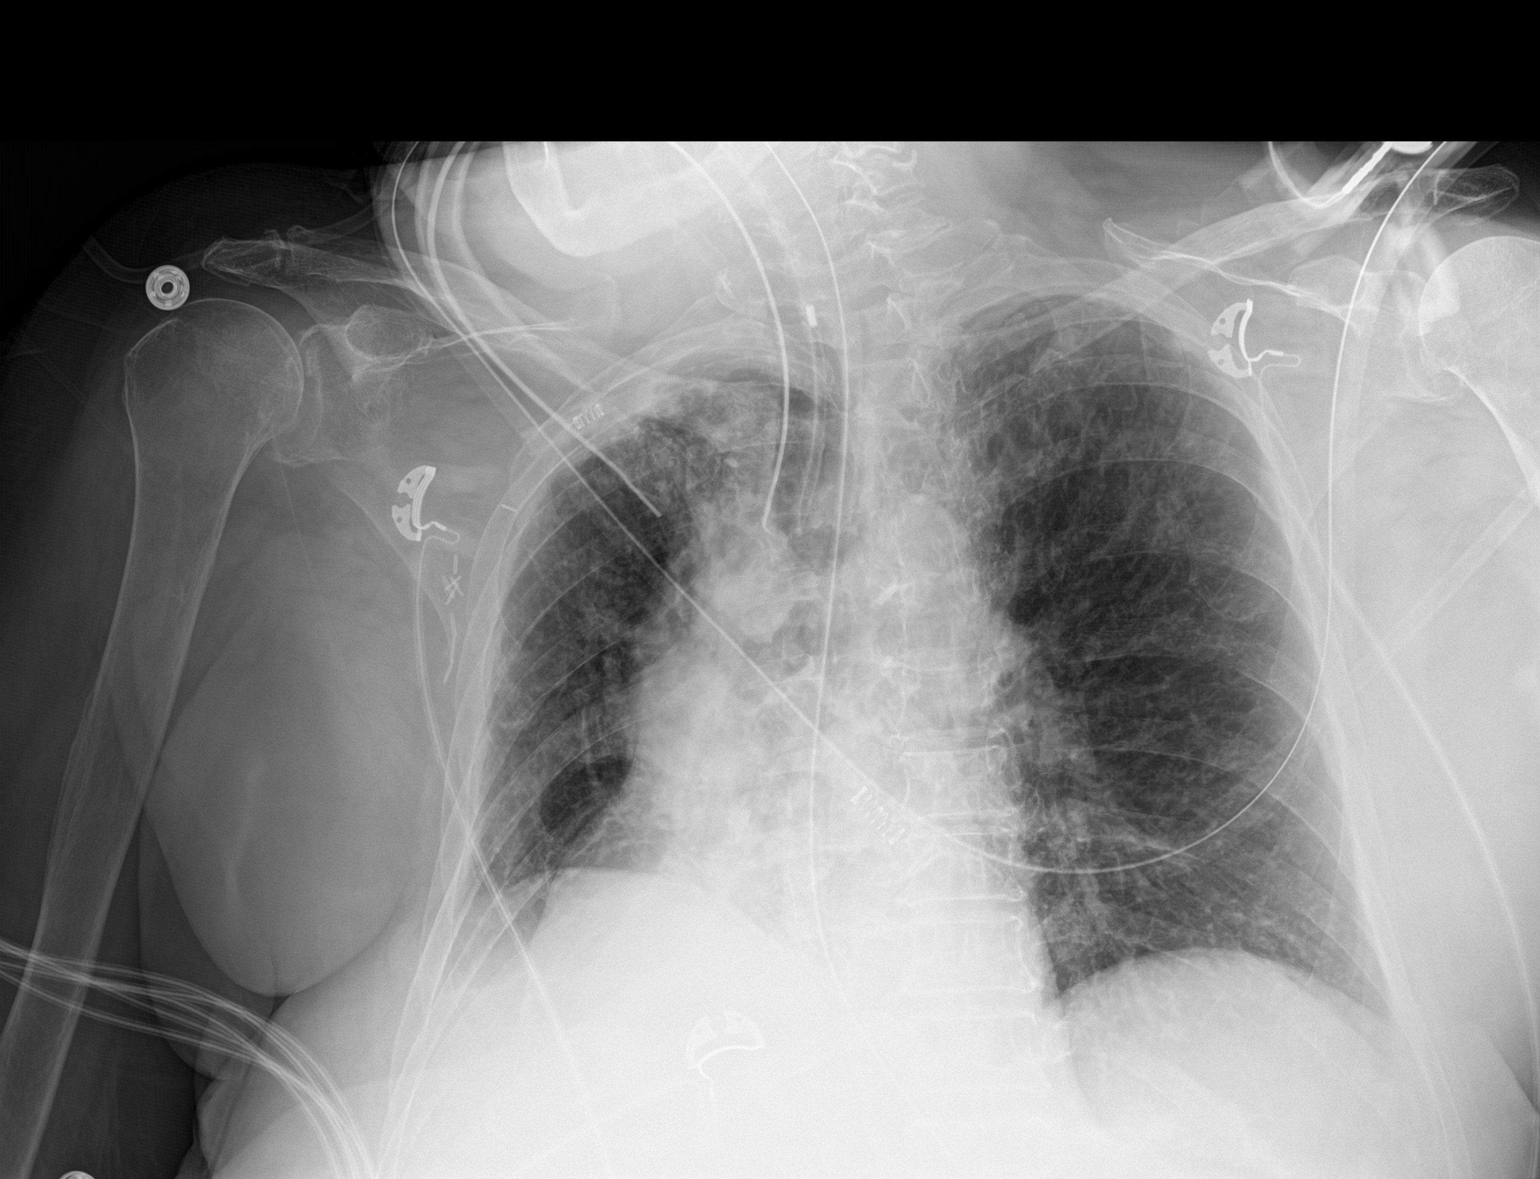

[chest ap (2 of 2)]
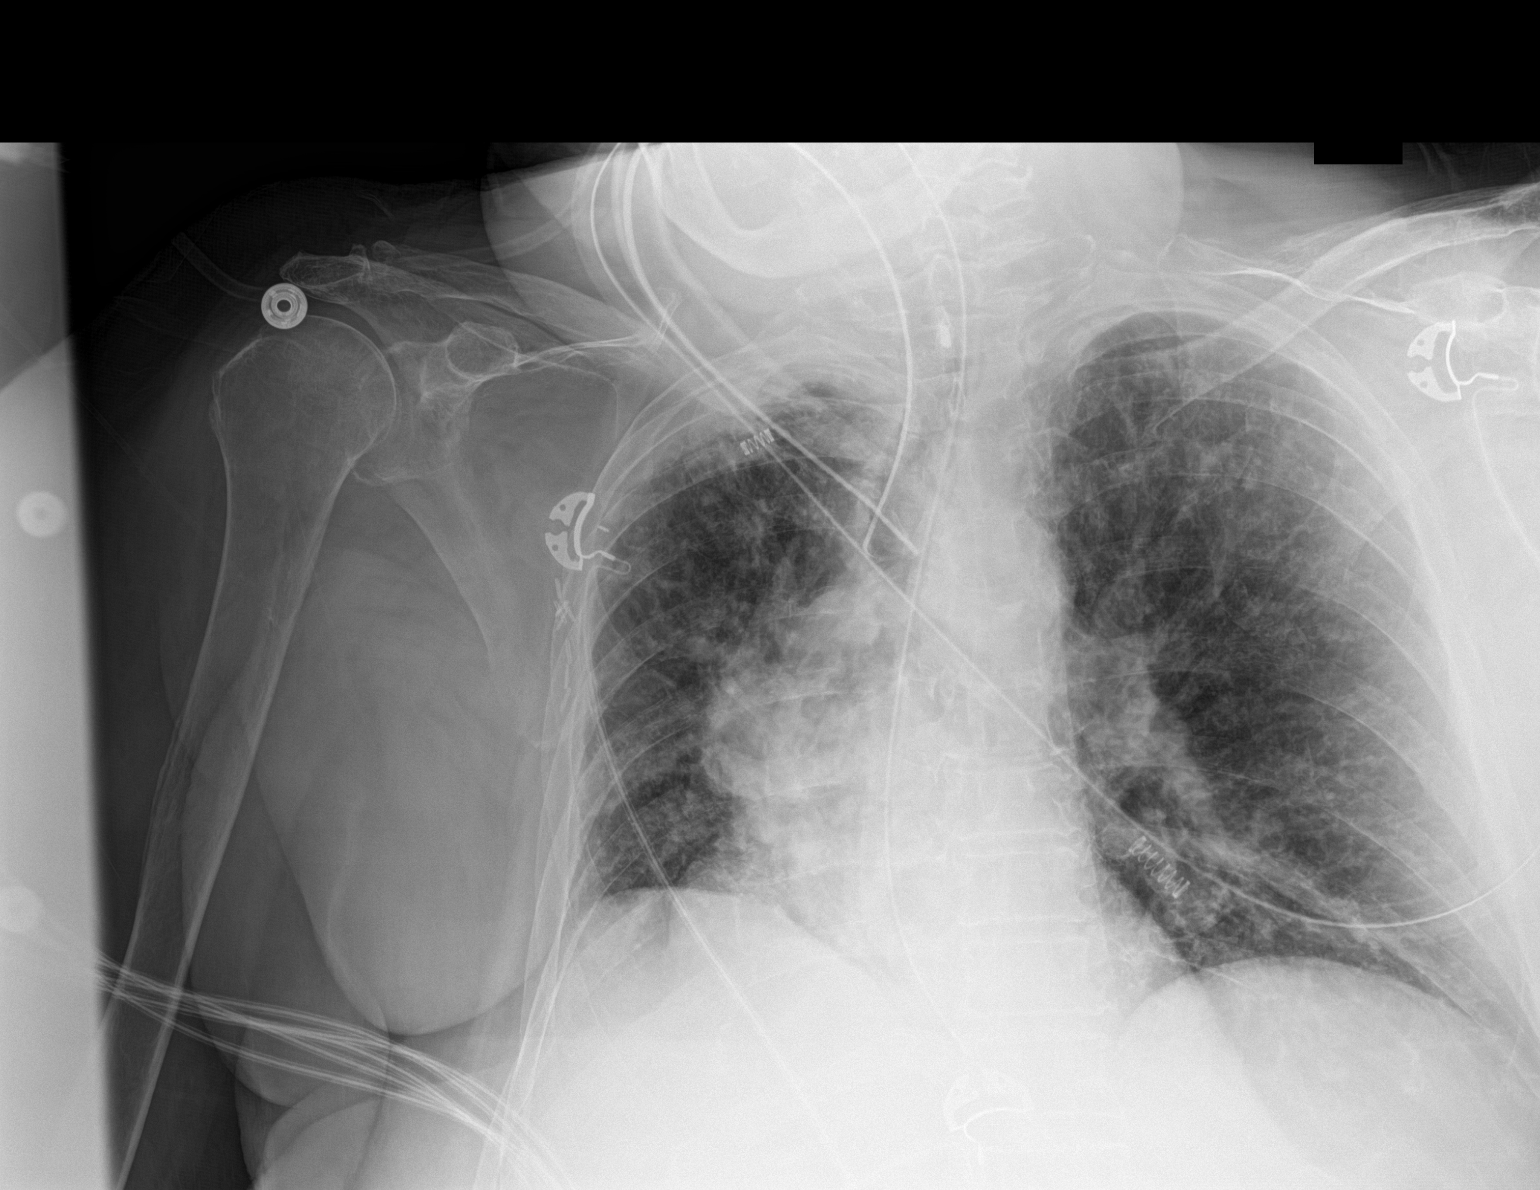

[2 of 2 positions shown; findings below may reference images not displayed]

FINDINGS: The tip of an endotracheal tube is 3.3 cm above the carina. Enteric
tube courses inferior to the left diaphragm. Axillary clips are seen
on the right. Stable cardiac and mediastinal contours. Stable mild
elevation of right hemidiaphragm. No overt pulmonary edema. No
pneumonic consolidation.
IMPRESSION: No active disease. Satisfactory endotracheal tube tip position.
Gastric tube extends below the left hemidiaphragm.
